# Patient Record
Sex: Female | Born: 1980 | Race: White | Hispanic: No | Marital: Single | Smoking: Current every day smoker
Health system: Southern US, Community
[De-identification: ages and names within clinical notes are randomized; demographics above are authoritative.]

## PROBLEM LIST (undated history)

## (undated) DIAGNOSIS — F319 Bipolar disorder, unspecified: Secondary | ICD-10-CM

## (undated) DIAGNOSIS — Q5210 Doubling of vagina, unspecified: Secondary | ICD-10-CM

## (undated) DIAGNOSIS — F329 Major depressive disorder, single episode, unspecified: Secondary | ICD-10-CM

## (undated) DIAGNOSIS — F419 Anxiety disorder, unspecified: Secondary | ICD-10-CM

## (undated) DIAGNOSIS — R1110 Vomiting, unspecified: Secondary | ICD-10-CM

## (undated) DIAGNOSIS — K5792 Diverticulitis of intestine, part unspecified, without perforation or abscess without bleeding: Secondary | ICD-10-CM

## (undated) DIAGNOSIS — F32A Depression, unspecified: Secondary | ICD-10-CM

## (undated) DIAGNOSIS — K449 Diaphragmatic hernia without obstruction or gangrene: Secondary | ICD-10-CM

## (undated) DIAGNOSIS — K469 Unspecified abdominal hernia without obstruction or gangrene: Secondary | ICD-10-CM

## (undated) DIAGNOSIS — K219 Gastro-esophageal reflux disease without esophagitis: Secondary | ICD-10-CM

## (undated) DIAGNOSIS — J45909 Unspecified asthma, uncomplicated: Secondary | ICD-10-CM

## (undated) HISTORY — PX: FRACTURE SURGERY: SHX138

## (undated) HISTORY — DX: Diverticulitis of intestine, part unspecified, without perforation or abscess without bleeding: K57.92

## (undated) HISTORY — PX: HERNIA REPAIR: SHX51

## (undated) HISTORY — DX: Vomiting, unspecified: R11.10

## (undated) HISTORY — DX: Unspecified abdominal hernia without obstruction or gangrene: K46.9

## (undated) HISTORY — PX: TUBAL LIGATION: SHX77

---

## 2000-08-30 ENCOUNTER — Encounter: Payer: Self-pay | Admitting: Emergency Medicine

## 2000-08-30 ENCOUNTER — Emergency Department (HOSPITAL_COMMUNITY): Admission: EM | Admit: 2000-08-30 | Discharge: 2000-08-30 | Payer: Self-pay | Admitting: Emergency Medicine

## 2001-08-12 ENCOUNTER — Emergency Department (HOSPITAL_COMMUNITY): Admission: EM | Admit: 2001-08-12 | Discharge: 2001-08-12 | Payer: Self-pay | Admitting: Emergency Medicine

## 2001-11-13 ENCOUNTER — Emergency Department (HOSPITAL_COMMUNITY): Admission: EM | Admit: 2001-11-13 | Discharge: 2001-11-13 | Payer: Self-pay | Admitting: Emergency Medicine

## 2001-11-13 ENCOUNTER — Encounter: Payer: Self-pay | Admitting: Emergency Medicine

## 2001-11-21 ENCOUNTER — Inpatient Hospital Stay (HOSPITAL_COMMUNITY): Admission: AD | Admit: 2001-11-21 | Discharge: 2001-11-21 | Payer: Self-pay | Admitting: *Deleted

## 2005-10-13 ENCOUNTER — Emergency Department (HOSPITAL_COMMUNITY): Admission: EM | Admit: 2005-10-13 | Discharge: 2005-10-13 | Payer: Self-pay | Admitting: Emergency Medicine

## 2006-07-08 ENCOUNTER — Emergency Department: Payer: Self-pay | Admitting: Emergency Medicine

## 2006-11-14 ENCOUNTER — Emergency Department: Payer: Self-pay | Admitting: Emergency Medicine

## 2007-05-28 ENCOUNTER — Emergency Department: Payer: Self-pay | Admitting: Emergency Medicine

## 2008-08-19 ENCOUNTER — Ambulatory Visit: Payer: Self-pay | Admitting: Gastroenterology

## 2009-02-03 ENCOUNTER — Ambulatory Visit: Payer: Self-pay | Admitting: Family Medicine

## 2009-02-10 ENCOUNTER — Emergency Department (HOSPITAL_COMMUNITY): Admission: EM | Admit: 2009-02-10 | Discharge: 2009-02-10 | Payer: Self-pay | Admitting: Emergency Medicine

## 2009-03-16 ENCOUNTER — Emergency Department: Payer: Self-pay | Admitting: Emergency Medicine

## 2009-05-27 ENCOUNTER — Emergency Department: Payer: Self-pay | Admitting: Emergency Medicine

## 2009-07-22 ENCOUNTER — Emergency Department: Payer: Self-pay | Admitting: Emergency Medicine

## 2009-12-19 ENCOUNTER — Emergency Department: Payer: Self-pay | Admitting: Internal Medicine

## 2010-07-21 LAB — URINALYSIS, ROUTINE W REFLEX MICROSCOPIC
Bilirubin Urine: NEGATIVE
Bilirubin Urine: NEGATIVE
Glucose, UA: NEGATIVE mg/dL
Glucose, UA: NEGATIVE mg/dL
Hgb urine dipstick: NEGATIVE
Hgb urine dipstick: NEGATIVE
Ketones, ur: NEGATIVE mg/dL
Nitrite: NEGATIVE
Protein, ur: NEGATIVE mg/dL
Protein, ur: NEGATIVE mg/dL
Specific Gravity, Urine: 1.009 (ref 1.005–1.030)
Specific Gravity, Urine: 1.026 (ref 1.005–1.030)
Urobilinogen, UA: 0.2 mg/dL (ref 0.0–1.0)
Urobilinogen, UA: 1 mg/dL (ref 0.0–1.0)
pH: 6 (ref 5.0–8.0)

## 2010-07-21 LAB — DIFFERENTIAL
Eosinophils Absolute: 0.1 10*3/uL (ref 0.0–0.7)
Eosinophils Relative: 1 % (ref 0–5)
Lymphocytes Relative: 18 % (ref 12–46)
Lymphs Abs: 1.8 10*3/uL (ref 0.7–4.0)
Monocytes Relative: 5 % (ref 3–12)

## 2010-07-21 LAB — URINE MICROSCOPIC-ADD ON

## 2010-07-21 LAB — CBC
HCT: 39.2 % (ref 36.0–46.0)
Hemoglobin: 13.7 g/dL (ref 12.0–15.0)
MCV: 94.2 fL (ref 78.0–100.0)
RBC: 4.16 MIL/uL (ref 3.87–5.11)
WBC: 10.5 10*3/uL (ref 4.0–10.5)

## 2010-07-21 LAB — URINE CULTURE

## 2010-07-21 LAB — POCT I-STAT, CHEM 8
BUN: 16 mg/dL (ref 6–23)
Creatinine, Ser: 0.8 mg/dL (ref 0.4–1.2)
Glucose, Bld: 86 mg/dL (ref 70–99)
Hemoglobin: 14.3 g/dL (ref 12.0–15.0)
Sodium: 138 mEq/L (ref 135–145)
TCO2: 23 mmol/L (ref 0–100)

## 2010-07-21 LAB — WET PREP, GENITAL

## 2010-07-21 LAB — POCT PREGNANCY, URINE: Preg Test, Ur: NEGATIVE

## 2011-01-02 ENCOUNTER — Inpatient Hospital Stay (INDEPENDENT_AMBULATORY_CARE_PROVIDER_SITE_OTHER)
Admission: RE | Admit: 2011-01-02 | Discharge: 2011-01-02 | Disposition: A | Discharge status: Home / Self Care | Payer: Self-pay | Source: Ambulatory Visit | Attending: Emergency Medicine | Admitting: Emergency Medicine

## 2011-01-02 DIAGNOSIS — J40 Bronchitis, not specified as acute or chronic: Secondary | ICD-10-CM

## 2011-01-02 DIAGNOSIS — J019 Acute sinusitis, unspecified: Secondary | ICD-10-CM

## 2011-05-04 ENCOUNTER — Emergency Department (INDEPENDENT_AMBULATORY_CARE_PROVIDER_SITE_OTHER)
Admission: EM | Admit: 2011-05-04 | Discharge: 2011-05-04 | Disposition: A | Discharge status: Home / Self Care | Payer: Self-pay | Source: Home / Self Care | Attending: Family Medicine | Admitting: Family Medicine

## 2011-05-04 DIAGNOSIS — S93409A Sprain of unspecified ligament of unspecified ankle, initial encounter: Secondary | ICD-10-CM

## 2011-05-04 DIAGNOSIS — S93402A Sprain of unspecified ligament of left ankle, initial encounter: Secondary | ICD-10-CM

## 2011-05-04 MED ORDER — IBUPROFEN 800 MG PO TABS: 800.0000 mg | ORAL_TABLET | Freq: Three times a day (TID) | ORAL | Status: AC
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
## 2011-06-17 ENCOUNTER — Encounter (HOSPITAL_COMMUNITY): Payer: Self-pay | Admitting: Emergency Medicine

## 2011-06-17 ENCOUNTER — Emergency Department (HOSPITAL_COMMUNITY): Payer: Self-pay

## 2011-06-17 ENCOUNTER — Emergency Department (HOSPITAL_COMMUNITY)
Admission: EM | Admit: 2011-06-17 | Discharge: 2011-06-18 | Disposition: A | Discharge status: Home / Self Care | Payer: Self-pay | Attending: Emergency Medicine | Admitting: Emergency Medicine

## 2011-06-17 DIAGNOSIS — F419 Anxiety disorder, unspecified: Secondary | ICD-10-CM | POA: Insufficient documentation

## 2011-06-17 DIAGNOSIS — R109 Unspecified abdominal pain: Secondary | ICD-10-CM | POA: Insufficient documentation

## 2011-06-17 DIAGNOSIS — R102 Pelvic and perineal pain: Secondary | ICD-10-CM

## 2011-06-17 DIAGNOSIS — Z79899 Other long term (current) drug therapy: Secondary | ICD-10-CM | POA: Insufficient documentation

## 2011-06-17 DIAGNOSIS — N949 Unspecified condition associated with female genital organs and menstrual cycle: Secondary | ICD-10-CM | POA: Insufficient documentation

## 2011-06-17 DIAGNOSIS — K219 Gastro-esophageal reflux disease without esophagitis: Secondary | ICD-10-CM | POA: Insufficient documentation

## 2011-06-17 DIAGNOSIS — K5190 Ulcerative colitis, unspecified, without complications: Secondary | ICD-10-CM | POA: Insufficient documentation

## 2011-06-17 DIAGNOSIS — N760 Acute vaginitis: Secondary | ICD-10-CM | POA: Insufficient documentation

## 2011-06-17 DIAGNOSIS — A499 Bacterial infection, unspecified: Secondary | ICD-10-CM | POA: Insufficient documentation

## 2011-06-17 DIAGNOSIS — Q5210 Doubling of vagina, unspecified: Secondary | ICD-10-CM | POA: Insufficient documentation

## 2011-06-17 DIAGNOSIS — B9689 Other specified bacterial agents as the cause of diseases classified elsewhere: Secondary | ICD-10-CM | POA: Insufficient documentation

## 2011-06-17 HISTORY — DX: Gastro-esophageal reflux disease without esophagitis: K21.9

## 2011-06-17 HISTORY — DX: Doubling of vagina, unspecified: Q52.10

## 2011-06-17 HISTORY — DX: Anxiety disorder, unspecified: F41.9

## 2011-06-17 LAB — POCT PREGNANCY, URINE: Preg Test, Ur: NEGATIVE

## 2011-06-17 LAB — URINALYSIS, ROUTINE W REFLEX MICROSCOPIC
Bilirubin Urine: NEGATIVE
Glucose, UA: NEGATIVE mg/dL
Ketones, ur: NEGATIVE mg/dL
Leukocytes, UA: NEGATIVE
Nitrite: NEGATIVE
Protein, ur: NEGATIVE mg/dL
Specific Gravity, Urine: 1.024 (ref 1.005–1.030)
Urobilinogen, UA: 0.2 mg/dL (ref 0.0–1.0)
pH: 6.5 (ref 5.0–8.0)

## 2011-06-17 LAB — COMPREHENSIVE METABOLIC PANEL
ALT: 23 U/L (ref 0–35)
AST: 17 U/L (ref 0–37)
Albumin: 3.6 g/dL (ref 3.5–5.2)
Alkaline Phosphatase: 51 U/L (ref 39–117)
BUN: 14 mg/dL (ref 6–23)
CO2: 27 mEq/L (ref 19–32)
Calcium: 9.6 mg/dL (ref 8.4–10.5)
Chloride: 104 mEq/L (ref 96–112)
Creatinine, Ser: 0.88 mg/dL (ref 0.50–1.10)
GFR calc Af Amer: >90 mL/min (ref >90)
GFR calc non Af Amer: 87 mL/min — ABNORMAL LOW (ref >90)
Glucose, Bld: 91 mg/dL (ref 70–99)
Potassium: 4.2 mEq/L (ref 3.5–5.1)
Sodium: 139 mEq/L (ref 135–145)
Total Bilirubin: 0.1 mg/dL — ABNORMAL LOW (ref 0.3–1.2)
Total Protein: 6.7 g/dL (ref 6.0–8.3)

## 2011-06-17 LAB — CBC
HCT: 36 % (ref 36.0–46.0)
Hemoglobin: 12.4 g/dL (ref 12.0–15.0)
MCH: 31.9 pg (ref 26.0–34.0)
MCHC: 34.4 g/dL (ref 30.0–36.0)
MCV: 92.5 fL (ref 78.0–100.0)
Platelets: 257 10*3/uL (ref 150–400)
RBC: 3.89 MIL/uL (ref 3.87–5.11)
RDW: 12 % (ref 11.5–15.5)
WBC: 11.7 10*3/uL — ABNORMAL HIGH (ref 4.0–10.5)

## 2011-06-17 LAB — DIFFERENTIAL
Basophils Absolute: 0 10*3/uL (ref 0.0–0.1)
Basophils Relative: 0 % (ref 0–1)
Eosinophils Absolute: 0.3 10*3/uL (ref 0.0–0.7)
Eosinophils Relative: 3 % (ref 0–5)
Lymphocytes Relative: 34 % (ref 12–46)
Lymphs Abs: 4 10*3/uL (ref 0.7–4.0)
Monocytes Absolute: 0.8 10*3/uL (ref 0.1–1.0)
Monocytes Relative: 7 % (ref 3–12)
Neutro Abs: 6.6 10*3/uL (ref 1.7–7.7)
Neutrophils Relative %: 56 % (ref 43–77)

## 2011-06-17 LAB — URINE MICROSCOPIC-ADD ON

## 2011-06-17 LAB — LIPASE, BLOOD: Lipase: 24 U/L (ref 11–59)

## 2011-06-17 MED ORDER — MORPHINE SULFATE 4 MG/ML IJ SOLN
6.0000 mg | Freq: Once | INTRAMUSCULAR | Status: AC
  Administered 2011-06-17: 6 mg via INTRAVENOUS
  Filled 2011-06-17: qty 2

## 2011-06-17 MED ORDER — ONDANSETRON HCL 4 MG/2ML IJ SOLN
4.0000 mg | Freq: Once | INTRAMUSCULAR | Status: AC
  Administered 2011-06-17: 4 mg via INTRAVENOUS
  Filled 2011-06-17: qty 2

## 2011-06-17 NOTE — ED Notes (Signed)
Patient complaining of abdominal pain, lower back pain, and urinary frequency Patient states that she has a "uteral septum", that she has not had treated (2010) --- patient states that she has had problems with abdominal pain and back pain since her diagnosis.  Patient states that her symptoms are no different than in the past several months, other than she "cannot tolerate the pain anymore".  Patient denies nausea, vomiting, and diarrhea.

## 2011-06-18 LAB — WET PREP, GENITAL
Trich, Wet Prep: NONE SEEN
Yeast Wet Prep HPF POC: NONE SEEN

## 2011-06-18 MED ORDER — METRONIDAZOLE 500 MG PO TABS: 500.0000 mg | ORAL_TABLET | Freq: Two times a day (BID) | ORAL | Status: AC

## 2011-06-18 MED ORDER — MORPHINE SULFATE 4 MG/ML IJ SOLN
6.0000 mg | Freq: Once | INTRAMUSCULAR | Status: AC
  Administered 2011-06-18: 6 mg via INTRAVENOUS
  Filled 2011-06-18: qty 2

## 2011-06-18 MED ORDER — HYDROCODONE-ACETAMINOPHEN 5-325 MG PO TABS: 1.0000 | ORAL_TABLET | Freq: Four times a day (QID) | ORAL | Status: AC | PRN

## 2011-06-18 NOTE — Discharge Instructions (Signed)
Return here as needed. Follow up with your doctor. °

## 2011-06-18 NOTE — ED Provider Notes (Signed)
History     CSN: [id number]  Arrival date & time 06/17/11  1448   First MD Initiated Contact with Patient 06/17/11 2150      Chief Complaint  Patient presents with  . Abdominal Pain    (Consider location/radiation/quality/duration/timing/severity/associated sxs/prior treatment) HPI   Patient has the emergency department with lower abdominal pain she states she has had in the past but began to carry 2 weeks ago.  She states that she has a uterine issue that she has been seen for the past that causes the similar type pain.  It seems to get worse with her onset of period.  She states that she started her period about 3 hours before coming to the emergency department.  She denies nausea/vomiting, chest pain, shortness of breath, weakness, back pain, urinary symptoms or fevers.  She states that she has a uterine septum.  Patient does state that she has had some mild vaginal discharge as well. Past Medical History  Diagnosis Date  . Ulcerative colitis   . GERD (gastroesophageal reflux disease)   . Vaginal septum   . Anxiety     History reviewed. No pertinent past surgical history.  History reviewed. No pertinent family history.  History  Substance Use Topics  . Smoking status: Current Everyday Smoker -- 0.5 packs/day  . Smokeless tobacco: Not on file  . Alcohol Use: No    OB History    Grav Para Term Preterm Abortions TAB SAB Ect Mult Living                  Review of Systems All pertinent positives and negatives reviewed in the history of present illness  Allergies  Review of patient's allergies indicates no known allergies.  Home Medications   Current Outpatient Rx  Name Route Sig Dispense Refill  . BUSPIRONE HCL 10 MG PO TABS Oral Take 10 mg by mouth 3 (three) times daily.    Marland Kitchen CITALOPRAM HYDROBROMIDE 40 MG PO TABS Oral Take 40 mg by mouth daily.    Marland Kitchen CLONIDINE HCL 0.1 MG PO TABS Oral Take 0.05 mg by mouth 2 (two) times daily.     Marland Kitchen GABAPENTIN 400 MG PO CAPS Oral  Take 400 mg by mouth 3 (three) times daily.    . IBUPROFEN 200 MG PO TABS Oral Take 800 mg by mouth every 6 (six) hours as needed. For pain.    . TRAZODONE HCL 100 MG PO TABS Oral Take 200 mg by mouth at bedtime.    Marland Kitchen HYDROCODONE-ACETAMINOPHEN 5-325 MG PO TABS Oral Take 1 tablet by mouth every 6 (six) hours as needed for pain. 15 tablet 0  . METRONIDAZOLE 500 MG PO TABS Oral Take 1 tablet (500 mg total) by mouth 2 (two) times daily. 14 tablet 0    BP 114/73  Pulse 101  Temp(Src) 98.6 F (37 C) (Oral)  Resp 16  SpO2 97%  LMP 06/17/2011  Physical Exam  Nursing note and vitals reviewed. Constitutional: She is oriented to person, place, and time. She appears well-developed and well-nourished. No distress.  HENT:  Head: Normocephalic and atraumatic.  Cardiovascular: Normal rate, regular rhythm and normal heart sounds.   Pulmonary/Chest: Effort normal and breath sounds normal. No respiratory distress. She has no wheezes. She has no rales.  Abdominal: Soft. Bowel sounds are normal.    Genitourinary: Vagina normal. Cervix exhibits no motion tenderness. Right adnexum displays no mass, no tenderness and no fullness. Left adnexum displays tenderness. Left adnexum displays no mass  and no fullness.    Neurological: She is alert and oriented to person, place, and time.  Skin: Skin is warm and dry.    ED Course  Procedures (including critical care time)  Labs Reviewed  URINALYSIS, ROUTINE W REFLEX MICROSCOPIC - Abnormal; Notable for the following:    APPearance CLOUDY (*)    Hgb urine dipstick LARGE (*)    All other components within normal limits  URINE MICROSCOPIC-ADD ON - Abnormal; Notable for the following:    Squamous Epithelial / LPF MANY (*)    Bacteria, UA MANY (*)    All other components within normal limits  CBC - Abnormal; Notable for the following:    WBC 11.7 (*)    All other components within normal limits  COMPREHENSIVE METABOLIC PANEL - Abnormal; Notable for the  following:    Total Bilirubin 0.1 (*)    GFR calc non Af Amer 87 (*)    All other components within normal limits  WET PREP, GENITAL - Abnormal; Notable for the following:    Clue Cells Wet Prep HPF POC FEW (*)    WBC, Wet Prep HPF POC MODERATE (*)    All other components within normal limits  POCT PREGNANCY, URINE  DIFFERENTIAL  LIPASE, BLOOD  GC/CHLAMYDIA PROBE AMP, GENITAL   US Transvaginal Non-ob  06/18/2011  *RADIOLOGY REPORT*  Clinical Data: Pelvic pain  TRANSABDOMINAL AND TRANSVAGINAL ULTRASOUND OF PELVIS  Technique:  Both transabdominal and transvaginal ultrasound examinations of the pelvis were performed.  Transabdominal technique was performed for global imaging of the pelvis including uterus, ovaries, adnexal regions, and pelvic cul-de-sac.  It was necessary to proceed with endovaginal exam following the transabdominal exam to visualize the uterus, endometrium and ovaries.  Comparison:  None.  Findings: Uterus:  Measures 10.7 x 5.8 x 6.2 cm.  There are multiple Nabothian cysts noted within the cervix.  Endometrium: Appears normal for patient's age measuring 12.6 mm.  Right ovary: Measures 4.3 x 2.0 x 2.6 cm.    Normal appearance/no adnexal mass  Left ovary: Measures 3.6 x 1.9 x 2.9 cm.  Normal appearance/no adnexal mass  Other Findings:  No free fluid.  IMPRESSION:  1.  No acute findings. 2.  No pelvic mass or other significant abnormality.  Original Report Authenticated By: Angelita Ingles, M.D.   US Pelvis Complete  06/18/2011  *RADIOLOGY REPORT*  Clinical Data: Pelvic pain  TRANSABDOMINAL AND TRANSVAGINAL ULTRASOUND OF PELVIS  Technique:  Both transabdominal and transvaginal ultrasound examinations of the pelvis were performed.  Transabdominal technique was performed for global imaging of the pelvis including uterus, ovaries, adnexal regions, and pelvic cul-de-sac.  It was necessary to proceed with endovaginal exam following the transabdominal exam to visualize the uterus, endometrium  and ovaries.  Comparison:  None.  Findings: Uterus:  Measures 10.7 x 5.8 x 6.2 cm.  There are multiple Nabothian cysts noted within the cervix.  Endometrium: Appears normal for patient's age measuring 12.6 mm.  Right ovary: Measures 4.3 x 2.0 x 2.6 cm.    Normal appearance/no adnexal mass  Left ovary: Measures 3.6 x 1.9 x 2.9 cm.  Normal appearance/no adnexal mass  Other Findings:  No free fluid.  IMPRESSION:  1.  No acute findings. 2.  No pelvic mass or other significant abnormality.  Original Report Authenticated By: Angelita Ingles, M.D.     1. Pelvic pain   2. Bacterial vaginosis    Patient is referred back to her GYN doctor.  She is given  pain control for home and told to return here for any worsening in her condition.  I advised her that she does have a mild bacterial vaginosis we will treat based on her previous mild discharge.  She is told to return here as needed for any worsening in her condition   MDM  MDM Reviewed: nursing note and vitals Interpretation: labs and ultrasound            Brent General, PA-C 06/18/11 1531

## 2011-06-19 LAB — GC/CHLAMYDIA PROBE AMP, GENITAL
Chlamydia, DNA Probe: NEGATIVE
GC Probe Amp, Genital: NEGATIVE

## 2011-06-26 NOTE — ED Provider Notes (Signed)
Medical screening examination/treatment/procedure(s) were performed by non-physician practitioner and as supervising physician I was immediately available for consultation/collaboration.  Virgel Manifold, MD 06/26/11 1911

## 2011-08-02 ENCOUNTER — Encounter (HOSPITAL_COMMUNITY): Payer: Self-pay | Admitting: Emergency Medicine

## 2011-08-02 ENCOUNTER — Emergency Department (HOSPITAL_COMMUNITY)
Admission: EM | Admit: 2011-08-02 | Discharge: 2011-08-02 | Disposition: A | Discharge status: Home / Self Care | Payer: Self-pay | Attending: Emergency Medicine | Admitting: Emergency Medicine

## 2011-08-02 ENCOUNTER — Emergency Department (HOSPITAL_COMMUNITY): Payer: Self-pay

## 2011-08-02 DIAGNOSIS — J40 Bronchitis, not specified as acute or chronic: Secondary | ICD-10-CM | POA: Insufficient documentation

## 2011-08-02 DIAGNOSIS — R05 Cough: Secondary | ICD-10-CM | POA: Insufficient documentation

## 2011-08-02 DIAGNOSIS — J3489 Other specified disorders of nose and nasal sinuses: Secondary | ICD-10-CM | POA: Insufficient documentation

## 2011-08-02 DIAGNOSIS — R070 Pain in throat: Secondary | ICD-10-CM | POA: Insufficient documentation

## 2011-08-02 DIAGNOSIS — R079 Chest pain, unspecified: Secondary | ICD-10-CM | POA: Insufficient documentation

## 2011-08-02 DIAGNOSIS — R059 Cough, unspecified: Secondary | ICD-10-CM | POA: Insufficient documentation

## 2011-08-02 DIAGNOSIS — K219 Gastro-esophageal reflux disease without esophagitis: Secondary | ICD-10-CM | POA: Insufficient documentation

## 2011-08-02 DIAGNOSIS — Z79899 Other long term (current) drug therapy: Secondary | ICD-10-CM | POA: Insufficient documentation

## 2011-08-02 DIAGNOSIS — M549 Dorsalgia, unspecified: Secondary | ICD-10-CM | POA: Insufficient documentation

## 2011-08-02 LAB — D-DIMER, QUANTITATIVE: D-Dimer, Quant: 0.27 ug/mL-FEU (ref 0.00–0.48)

## 2011-08-02 LAB — POCT I-STAT, CHEM 8
BUN: 19 mg/dL (ref 6–23)
Chloride: 108 mEq/L (ref 96–112)
Creatinine, Ser: 1.1 mg/dL (ref 0.50–1.10)
Sodium: 139 mEq/L (ref 135–145)

## 2011-08-02 MED ORDER — ALBUTEROL SULFATE (5 MG/ML) 0.5% IN NEBU
2.5000 mg | INHALATION_SOLUTION | Freq: Once | RESPIRATORY_TRACT | Status: AC
  Administered 2011-08-02: 2.5 mg via RESPIRATORY_TRACT
  Filled 2011-08-02 (×2): qty 0.5

## 2011-08-02 MED ORDER — AZITHROMYCIN 250 MG PO TABS: 250.0000 mg | ORAL_TABLET | Freq: Every day | ORAL | Status: AC

## 2011-08-02 MED ORDER — ALBUTEROL SULFATE HFA 108 (90 BASE) MCG/ACT IN AERS: 2.0000 | INHALATION_SPRAY | RESPIRATORY_TRACT | Status: DC | PRN

## 2011-08-02 NOTE — ED Provider Notes (Signed)
History     CSN: [id number]  Arrival date & time 08/02/11  0100   First MD Initiated Contact with Patient 08/02/11 0214      Chief Complaint  Patient presents with  . Cough  . Back Pain    (Consider location/radiation/quality/duration/timing/severity/associated sxs/prior treatment) HPI Comments: Patient complains of 3 weeks of cough productive of white sputum as well as "lung pain" on her bilateral sides of her rib cage. She endorses anterior chest pain with coughing. She also has rhinorrhea, sore throat and congestion. She is a smoker but says she has cut down to half a pack a day. She denies any abdominal pain, nausea, vomiting, lower back pain. No weakness, numbness or tingling. She is not on birth control  The history is provided by the patient.    Past Medical History  Diagnosis Date  . Ulcerative colitis   . GERD (gastroesophageal reflux disease)   . Vaginal septum   . Anxiety     History reviewed. No pertinent past surgical history.  History reviewed. No pertinent family history.  History  Substance Use Topics  . Smoking status: Current Everyday Smoker -- 0.5 packs/day  . Smokeless tobacco: Not on file  . Alcohol Use: No    OB History    Grav Para Term Preterm Abortions TAB SAB Ect Mult Living                  Review of Systems  Constitutional: Positive for activity change and appetite change. Negative for fever.  HENT: Positive for congestion and rhinorrhea. Negative for sore throat.   Eyes: Negative for visual disturbance.  Respiratory: Positive for cough and chest tightness. Negative for shortness of breath.   Cardiovascular: Positive for chest pain.  Gastrointestinal: Negative for nausea, vomiting and abdominal pain.  Genitourinary: Negative for dysuria and hematuria.  Musculoskeletal: Positive for back pain.  Skin: Negative for rash.  Neurological: Negative for weakness and headaches.    Allergies  Review of patient's allergies indicates no  known allergies.  Home Medications   Current Outpatient Rx  Name Route Sig Dispense Refill  . BUSPIRONE HCL 10 MG PO TABS Oral Take 10 mg by mouth 3 (three) times daily.    Marland Kitchen CITALOPRAM HYDROBROMIDE 40 MG PO TABS Oral Take 40 mg by mouth daily.    Marland Kitchen CLONIDINE HCL 0.1 MG PO TABS Oral Take 0.05 mg by mouth 2 (two) times daily.     Marland Kitchen GABAPENTIN 400 MG PO CAPS Oral Take 400 mg by mouth 3 (three) times daily.    . IBUPROFEN 200 MG PO TABS Oral Take 800 mg by mouth every 6 (six) hours as needed. For pain.    . TRAZODONE HCL 100 MG PO TABS Oral Take 200 mg by mouth at bedtime.    . ALBUTEROL SULFATE HFA 108 (90 BASE) MCG/ACT IN AERS Inhalation Inhale 2 puffs into the lungs every 4 (four) hours as needed for wheezing. 1 Inhaler 0  . AZITHROMYCIN 250 MG PO TABS Oral Take 1 tablet (250 mg total) by mouth daily. Take first 2 tablets together, then 1 every day until finished. 6 tablet 0    BP 131/77  Pulse 113  Temp(Src) 98.8 F (37.1 C) (Oral)  Resp 18  SpO2 99%  LMP 06/17/2011  Physical Exam  Constitutional: She is oriented to person, place, and time. She appears well-developed and well-nourished. No distress.  HENT:  Head: Normocephalic and atraumatic.  Mouth/Throat: Oropharynx is clear and moist. No oropharyngeal exudate.  Eyes: Conjunctivae and EOM are normal. Pupils are equal, round, and reactive to light.  Neck: Normal range of motion. Neck supple.  Cardiovascular: Normal rate, regular rhythm and normal heart sounds.   Pulmonary/Chest: Effort normal and breath sounds normal. She exhibits tenderness.       Anterior chest wall tenderness  Abdominal: Soft. There is no tenderness. There is no rebound and no guarding.  Musculoskeletal: Normal range of motion. She exhibits no edema.  Neurological: She is alert and oriented to person, place, and time. No cranial nerve deficit.  Skin: Skin is warm.    ED Course  Procedures (including critical care time)   Labs Reviewed  D-DIMER,  QUANTITATIVE  POCT I-STAT, CHEM 8  LAB REPORT - SCANNED   Dg Chest 2 View  08/02/2011  *RADIOLOGY REPORT*  Clinical Data: Back pain, radiating to the left chest; congestion.  CHEST - 2 VIEW  Comparison: None.  Findings: The lungs are well-aerated and clear.  There is no evidence of focal opacification, pleural effusion or pneumothorax. Mildly increased density at the lung bases appears to reflect overlying soft tissues.  The heart is normal in size; the mediastinal contour is within normal limits.  No acute osseous abnormalities are seen.  IMPRESSION: No acute cardiopulmonary process seen.  Original Report Authenticated By: Santa Lighter, M.D.     1. Bronchitis       MDM  Cough, congestion, back pain chest pain and history of smoker. Vitals stable, no distress, lungs clear  Chest x-ray, breathing treatment, d-dimer.  CXR neg.  No distress.  Smoking cessation.       Ezequiel Essex, MD 08/02/11 [phone]

## 2011-08-02 NOTE — ED Notes (Addendum)
Patient complaining of mid-back pain and a persistent productive cough with yellow phlegm production for the past three weeks.  Patient also reports white spots on her tongue/tonsils.  Reports chest pain with coughing.

## 2011-08-02 NOTE — Discharge Instructions (Signed)

## 2011-12-07 ENCOUNTER — Emergency Department (HOSPITAL_COMMUNITY)
Admission: EM | Admit: 2011-12-07 | Discharge: 2011-12-07 | Disposition: A | Discharge status: Home / Self Care | Payer: Self-pay | Attending: Emergency Medicine | Admitting: Emergency Medicine

## 2011-12-07 ENCOUNTER — Encounter (HOSPITAL_COMMUNITY): Payer: Self-pay | Admitting: Emergency Medicine

## 2011-12-07 ENCOUNTER — Emergency Department (HOSPITAL_COMMUNITY): Payer: Self-pay

## 2011-12-07 DIAGNOSIS — S60219A Contusion of unspecified wrist, initial encounter: Secondary | ICD-10-CM | POA: Insufficient documentation

## 2011-12-07 DIAGNOSIS — F411 Generalized anxiety disorder: Secondary | ICD-10-CM | POA: Insufficient documentation

## 2011-12-07 DIAGNOSIS — F17200 Nicotine dependence, unspecified, uncomplicated: Secondary | ICD-10-CM | POA: Insufficient documentation

## 2011-12-07 DIAGNOSIS — K219 Gastro-esophageal reflux disease without esophagitis: Secondary | ICD-10-CM | POA: Insufficient documentation

## 2011-12-07 MED ORDER — IBUPROFEN 800 MG PO TABS: 800.0000 mg | ORAL_TABLET | Freq: Three times a day (TID) | ORAL | Status: AC

## 2011-12-07 MED ORDER — IBUPROFEN 400 MG PO TABS
800.0000 mg | ORAL_TABLET | Freq: Once | ORAL | Status: AC
  Administered 2011-12-07: 800 mg via ORAL
  Filled 2011-12-07: qty 2
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
## 2011-12-07 NOTE — ED Notes (Signed)
Ortho paged for velcro wrist splint.

## 2011-12-07 NOTE — Progress Notes (Signed)
Orthopedic Tech Progress Note Patient Details:  Victoria Pierce 06-01-1980 [id number] Velcro wrist splint applied to Right wrist, pt. Tolerated well. Pt. Already had discharge papers and ready to go. Nurse notified. Ortho Devices Type of Ortho Device: Velcro wrist splint Ortho Device/Splint Location: Applied to Right wrist Ortho Device/Splint Interventions: Application   Asia R Thompson 12/07/2011, 12:53 PM

## 2013-11-15 ENCOUNTER — Emergency Department (HOSPITAL_COMMUNITY)
Admission: EM | Admit: 2013-11-15 | Discharge: 2013-11-16 | Disposition: A | Discharge status: Home / Self Care | Payer: Self-pay | Attending: Emergency Medicine | Admitting: Emergency Medicine

## 2013-11-15 ENCOUNTER — Encounter (HOSPITAL_COMMUNITY): Payer: Self-pay | Admitting: Emergency Medicine

## 2013-11-15 DIAGNOSIS — Q524 Other congenital malformations of vagina: Secondary | ICD-10-CM

## 2013-11-15 DIAGNOSIS — Z791 Long term (current) use of non-steroidal anti-inflammatories (NSAID): Secondary | ICD-10-CM | POA: Insufficient documentation

## 2013-11-15 DIAGNOSIS — F411 Generalized anxiety disorder: Secondary | ICD-10-CM | POA: Insufficient documentation

## 2013-11-15 DIAGNOSIS — Z23 Encounter for immunization: Secondary | ICD-10-CM | POA: Insufficient documentation

## 2013-11-15 DIAGNOSIS — Z79899 Other long term (current) drug therapy: Secondary | ICD-10-CM | POA: Insufficient documentation

## 2013-11-15 DIAGNOSIS — S91311A Laceration without foreign body, right foot, initial encounter: Secondary | ICD-10-CM

## 2013-11-15 DIAGNOSIS — Z8719 Personal history of other diseases of the digestive system: Secondary | ICD-10-CM | POA: Insufficient documentation

## 2013-11-15 DIAGNOSIS — W208XXA Other cause of strike by thrown, projected or falling object, initial encounter: Secondary | ICD-10-CM | POA: Insufficient documentation

## 2013-11-15 DIAGNOSIS — S91309A Unspecified open wound, unspecified foot, initial encounter: Secondary | ICD-10-CM | POA: Insufficient documentation

## 2013-11-15 DIAGNOSIS — S99929A Unspecified injury of unspecified foot, initial encounter: Secondary | ICD-10-CM

## 2013-11-15 DIAGNOSIS — Q5270 Unspecified congenital malformations of vulva: Secondary | ICD-10-CM

## 2013-11-15 DIAGNOSIS — F17200 Nicotine dependence, unspecified, uncomplicated: Secondary | ICD-10-CM | POA: Insufficient documentation

## 2013-11-15 DIAGNOSIS — Y9289 Other specified places as the place of occurrence of the external cause: Secondary | ICD-10-CM | POA: Insufficient documentation

## 2013-11-15 DIAGNOSIS — Q519 Congenital malformation of uterus and cervix, unspecified: Secondary | ICD-10-CM | POA: Insufficient documentation

## 2013-11-15 DIAGNOSIS — S99919A Unspecified injury of unspecified ankle, initial encounter: Secondary | ICD-10-CM

## 2013-11-15 DIAGNOSIS — S8990XA Unspecified injury of unspecified lower leg, initial encounter: Secondary | ICD-10-CM | POA: Insufficient documentation

## 2013-11-15 DIAGNOSIS — Y9389 Activity, other specified: Secondary | ICD-10-CM | POA: Insufficient documentation
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
## 2013-11-16 ENCOUNTER — Emergency Department (HOSPITAL_COMMUNITY): Payer: Self-pay

## 2013-11-16 MED ORDER — OXYCODONE-ACETAMINOPHEN 5-325 MG PO TABS
1.0000 | ORAL_TABLET | Freq: Once | ORAL | Status: AC
  Administered 2013-11-16: 1 via ORAL
  Filled 2013-11-16: qty 1

## 2013-11-16 MED ORDER — TETANUS-DIPHTH-ACELL PERTUSSIS 5-2.5-18.5 LF-MCG/0.5 IM SUSP
0.5000 mL | Freq: Once | INTRAMUSCULAR | Status: AC
  Administered 2013-11-16: 0.5 mL via INTRAMUSCULAR
  Filled 2013-11-16: qty 0.5

## 2013-11-16 MED ORDER — IBUPROFEN 600 MG PO TABS: 600.0000 mg | ORAL_TABLET | Freq: Four times a day (QID) | ORAL | Status: DC | PRN

## 2013-11-16 MED ORDER — HYDROCODONE-ACETAMINOPHEN 5-325 MG PO TABS: 1.0000 | ORAL_TABLET | Freq: Four times a day (QID) | ORAL | Status: DC | PRN

## 2013-11-16 NOTE — ED Provider Notes (Signed)
CSN: [id number]     Arrival date & time 11/15/13  2318 History   First MD Initiated Contact with Patient 11/15/13 2334     Chief Complaint  Patient presents with  . Foot Injury     (Consider location/radiation/quality/duration/timing/severity/associated sxs/prior Treatment) HPI Victoria Pierce is a 33 y.o. female who presents to emergency department complaining of injury to her right foot. Patient states she was trying to put a new tie air on the car, when a tire iron slipped and fell stabbing her in the foot. Patient is here but laceration over her dorsal foot directly over first MTP joint. She states unable to stop the bleeding. She states she does have decreased sensation over the great toe, but states she is able to move in with pain. She denies any other injuries. Last tetanus unknown. No other complaints.  Past Medical History  Diagnosis Date  . Ulcerative colitis   . GERD (gastroesophageal reflux disease)   . Vaginal septum   . Anxiety    Past Surgical History  Procedure Laterality Date  . Tubal ligation     History reviewed. No pertinent family history. History  Substance Use Topics  . Smoking status: Current Every Day Smoker -- 0.50 packs/day  . Smokeless tobacco: Not on file  . Alcohol Use: No   OB History   Grav Para Term Preterm Abortions TAB SAB Ect Mult Living                 Review of Systems  Constitutional: Negative for fever and chills.  Musculoskeletal: Positive for arthralgias and joint swelling.  Skin: Positive for wound.  Neurological: Negative for weakness and numbness.  Hematological: Does not bruise/bleed easily.      Allergies  Review of patient's allergies indicates no known allergies.  Home Medications   Prior to Admission medications   Medication Sig Start Date End Date Taking? Authorizing Provider  albuterol (PROVENTIL HFA;VENTOLIN HFA) 108 (90 BASE) MCG/ACT inhaler Inhale 2 puffs into the lungs every 4 (four) hours as needed for  wheezing. 08/02/11 08/01/12  Ezequiel Essex, MD  benztropine (COGENTIN) 1 MG tablet Take 1 mg by mouth 2 (two) times daily.    Historical Provider, MD  busPIRone (BUSPAR) 10 MG tablet Take 10 mg by mouth 3 (three) times daily.    Historical Provider, MD  cloNIDine (CATAPRES) 0.1 MG tablet Take 0.05 mg by mouth 2 (two) times daily.     Historical Provider, MD  gabapentin (NEURONTIN) 400 MG capsule Take 400 mg by mouth 3 (three) times daily.    Historical Provider, MD  haloperidol (HALDOL) 2 MG tablet Take 4 mg by mouth 2 (two) times daily.    Historical Provider, MD  ibuprofen (ADVIL,MOTRIN) 200 MG tablet Take 800 mg by mouth every 6 (six) hours as needed. For pain.    Historical Provider, MD  meloxicam (MOBIC) 7.5 MG tablet Take 7.5 mg by mouth daily.    Historical Provider, MD  topiramate (TOPAMAX) 25 MG tablet Take 25 mg by mouth 2 (two) times daily.    Historical Provider, MD  traZODone (DESYREL) 100 MG tablet Take 200 mg by mouth at bedtime.    Historical Provider, MD   BP 116/73  Pulse 91  Temp(Src) 97.9 F (36.6 C) (Oral)  Resp 20  SpO2 98%  LMP 10/15/2013 Physical Exam  Nursing note and vitals reviewed. Constitutional: She appears well-developed and well-nourished. No distress.  HENT:  Head: Normocephalic.  Eyes: Conjunctivae are normal.  Neck: Neck  supple.  Musculoskeletal: She exhibits no edema.  Patient is able to flex and extend her great toe on the right foot, with good strength against resistance with toe dorsiflexion. Reports diminished sensation over dorsal and tip of her right great toe. Cap refill is less than 2 seconds.  Neurological: She is alert.  Skin: Skin is warm and dry.  3 cm laceration over dorsal foot directly over first MTP joint.  Psychiatric: She has a normal mood and affect. Her behavior is normal.    ED Course  Procedures (including critical care time) Labs Review Labs Reviewed - No data to display  Imaging Review No results found.   EKG  Interpretation None      LACERATION REPAIR Performed by: Renold Genta Authorized by: Jeannett Senior A Consent: Verbal consent obtained. Risks and benefits: risks, benefits and alternatives were discussed Consent given by: patient Patient identity confirmed: provided demographic data Prepped and Draped in normal sterile fashion Wound explored  Laceration Location: MTP joint of the right great toe  Laceration Length: 3cm  No Foreign Bodies seen or palpated  Anesthesia: local infiltration  Local anesthetic: lidocaine 2% wo epinephrine  Anesthetic total: 3 ml  Irrigation method: syringe Amount of cleaning: standard  Skin closure: prolene 4.0  Number of sutures: 5  Technique: simple interrupted  Patient tolerance: Patient tolerated the procedure well with no immediate complications.  MDM   Final diagnoses:  Laceration of foot, right, initial encounter    Patient with a stab/laceration to the dorsal MTP joint of the right great toe. X-rays are negative. Patient has full range of motion of the toe actively and passively, good strength against resistance with toe dorsiflexion and plantar flexion. Cap refill distally is less than 2 seconds. Patient reports mildly decreased sensation to the dorsal distal toe. Wounds known to and explored, do not see any obvious tendon or nerve injury. Repaired with sutures. Have explained to patient although I do not see any obvious deep tissue injuries she will be referred to orthopedics Dr. for recheck and she should followup if she has any persistent numbness or weakness in the toe. Patient is agreeable to plan and will followup as needed. Patient placed in a postop shoe, and dressing applied to the wound. Patient is requesting pain medications, we'll prescribe 10 tablets of Norco in addition to ibuprofen.  Filed Vitals:   11/15/13 2331  BP: 116/73  Pulse: 91  Temp: 97.9 F (36.6 C)  TempSrc: Oral  Resp: 20  SpO2: 98%        Florene Route [name], PA-C 11/16/13 0143

## 2013-11-16 NOTE — ED Provider Notes (Signed)
Medical screening examination/treatment/procedure(s) were performed by non-physician practitioner and as supervising physician I was immediately available for consultation/collaboration.   EKG Interpretation None       Kalman Drape, MD 11/16/13 [phone]

## 2013-11-16 NOTE — Discharge Instructions (Signed)
Ibuprofen for pain. Norco for severe pain. Keep foot elevated. Ice. Bacitracin to the laceration twice a day. If continue to have decreased sensation in the toe in 2-3 days or if have weakness with movement follow up with orthopedics specialist.    Laceration Care, Adult A laceration is a cut that goes through all layers of the skin. The cut goes into the tissue beneath the skin. HOME CARE For stitches (sutures) or staples:  Keep the cut clean and dry.  If you have a bandage (dressing), change it at least once a day. Change the bandage if it gets wet or dirty, or as told by your doctor.  Wash the cut with soap and water 2 times a day. Rinse the cut with water. Pat it dry with a clean towel.  Put a thin layer of medicated cream on the cut as told by your doctor.  You may shower after the first 24 hours. Do not soak the cut in water until the stitches are removed.  Only take medicines as told by your doctor.  Have your stitches or staples removed as told by your doctor. For skin adhesive strips:  Keep the cut clean and dry.  Do not get the strips wet. You may take a bath, but be careful to keep the cut dry.  If the cut gets wet, pat it dry with a clean towel.  The strips will fall off on their own. Do not remove the strips that are still stuck to the cut. For wound glue:  You may shower or take baths. Do not soak or scrub the cut. Do not swim. Avoid heavy sweating until the glue falls off on its own. After a shower or bath, pat the cut dry with a clean towel.  Do not put medicine on your cut until the glue falls off.  If you have a bandage, do not put tape over the glue.  Avoid lots of sunlight or tanning lamps until the glue falls off. Put sunscreen on the cut for the first year to reduce your scar.  The glue will fall off on its own. Do not pick at the glue. You may need a tetanus shot if:  You cannot remember when you had your last tetanus shot.  You have never had a  tetanus shot. If you need a tetanus shot and you choose not to have one, you may get tetanus. Sickness from tetanus can be serious. GET HELP RIGHT AWAY IF:   Your pain does not get better with medicine.  Your arm, hand, leg, or foot loses feeling (numbness) or changes color.  Your cut is bleeding.  Your joint feels weak, or you cannot use your joint.  You have painful lumps on your body.  Your cut is red, puffy (swollen), or painful.  You have a red line on the skin near the cut.  You have yellowish-white fluid (pus) coming from the cut.  You have a fever.  You have a bad smell coming from the cut or bandage.  Your cut breaks open before or after stitches are removed.  You notice something coming out of the cut, such as wood or glass.  You cannot move a finger or toe. MAKE SURE YOU:   Understand these instructions.  Will watch your condition.  Will get help right away if you are not doing well or get worse. Document Released: 09/20/2007 Document Revised: 06/26/2011 Document Reviewed: 09/27/2010 [facility] Patient Information 2015 [place], [place]. This information is not  intended to replace advice given to you by your health care provider. Make sure you discuss any questions you have with your health care provider.

## 2015-01-31 ENCOUNTER — Emergency Department: Payer: Self-pay

## 2015-01-31 ENCOUNTER — Emergency Department
Admission: EM | Admit: 2015-01-31 | Discharge: 2015-01-31 | Disposition: A | Discharge status: Home / Self Care | Payer: Self-pay | Attending: Emergency Medicine | Admitting: Emergency Medicine

## 2015-01-31 DIAGNOSIS — Z79899 Other long term (current) drug therapy: Secondary | ICD-10-CM | POA: Insufficient documentation

## 2015-01-31 DIAGNOSIS — J069 Acute upper respiratory infection, unspecified: Secondary | ICD-10-CM | POA: Insufficient documentation

## 2015-01-31 DIAGNOSIS — M549 Dorsalgia, unspecified: Secondary | ICD-10-CM | POA: Insufficient documentation

## 2015-01-31 DIAGNOSIS — Z791 Long term (current) use of non-steroidal anti-inflammatories (NSAID): Secondary | ICD-10-CM | POA: Insufficient documentation

## 2015-01-31 DIAGNOSIS — Z720 Tobacco use: Secondary | ICD-10-CM | POA: Insufficient documentation

## 2015-01-31 MED ORDER — KETOROLAC TROMETHAMINE 60 MG/2ML IM SOLN
60.0000 mg | Freq: Once | INTRAMUSCULAR | Status: AC
  Administered 2015-01-31: 60 mg via INTRAMUSCULAR
  Filled 2015-01-31: qty 2

## 2015-01-31 MED ORDER — PSEUDOEPH-BROMPHEN-DM 30-2-10 MG/5ML PO SYRP: 5.0000 mL | ORAL_SOLUTION | Freq: Four times a day (QID) | ORAL | Status: DC | PRN

## 2015-01-31 MED ORDER — IBUPROFEN 800 MG PO TABS: 800.0000 mg | ORAL_TABLET | Freq: Three times a day (TID) | ORAL | Status: DC | PRN

## 2015-01-31 NOTE — Discharge Instructions (Signed)
Upper Respiratory Infection, Adult Most upper respiratory infections (URIs) are a viral infection of the air passages leading to the lungs. A URI affects the nose, throat, and upper air passages. The most common type of URI is nasopharyngitis and is typically referred to as "the common cold." URIs run their course and usually go away on their own. Most of the time, a URI does not require medical attention, but sometimes a bacterial infection in the upper airways can follow a viral infection. This is called a secondary infection. Sinus and middle ear infections are common types of secondary upper respiratory infections. Bacterial pneumonia can also complicate a URI. A URI can worsen asthma and chronic obstructive pulmonary disease (COPD). Sometimes, these complications can require emergency medical care and may be life threatening.  CAUSES Almost all URIs are caused by viruses. A virus is a type of germ and can spread from one person to another.  RISKS FACTORS You may be at risk for a URI if:   You smoke.   You have chronic heart or lung disease.  You have a weakened defense (immune) system.   You are very young or very old.   You have nasal allergies or asthma.  You work in crowded or poorly ventilated areas.  You work in health care facilities or schools. SIGNS AND SYMPTOMS  Symptoms typically develop 2-3 days after you come in contact with a cold virus. Most viral URIs last 7-10 days. However, viral URIs from the influenza virus (flu virus) can last 14-18 days and are typically more severe. Symptoms may include:   Runny or stuffy (congested) nose.   Sneezing.   Cough.   Sore throat.   Headache.   Fatigue.   Fever.   Loss of appetite.   Pain in your forehead, behind your eyes, and over your cheekbones (sinus pain).  Muscle aches.  DIAGNOSIS  Your health care provider may diagnose a URI by:  Physical exam.  Tests to check that your symptoms are not due to  another condition such as:  Strep throat.  Sinusitis.  Pneumonia.  Asthma. TREATMENT  A URI goes away on its own with time. It cannot be cured with medicines, but medicines may be prescribed or recommended to relieve symptoms. Medicines may help:  Reduce your fever.  Reduce your cough.  Relieve nasal congestion. HOME CARE INSTRUCTIONS   Take medicines only as directed by your health care provider.   Gargle warm saltwater or take cough drops to comfort your throat as directed by your health care provider.  Use a warm mist humidifier or inhale steam from a shower to increase air moisture. This may make it easier to breathe.  Drink enough fluid to keep your urine clear or pale yellow.   Eat soups and other clear broths and maintain good nutrition.   Rest as needed.   Return to work when your temperature has returned to normal or as your health care provider advises. You may need to stay home longer to avoid infecting others. You can also use a face mask and careful hand washing to prevent spread of the virus.  Increase the usage of your inhaler if you have asthma.   Do not use any tobacco products, including cigarettes, chewing tobacco, or electronic cigarettes. If you need help quitting, ask your health care provider. PREVENTION  The best way to protect yourself from getting a cold is to practice good hygiene.   Avoid oral or hand contact with people with cold   symptoms.   Wash your hands often if contact occurs.  There is no clear evidence that vitamin C, vitamin E, echinacea, or exercise reduces the chance of developing a cold. However, it is always recommended to get plenty of rest, exercise, and practice good nutrition.  SEEK MEDICAL CARE IF:   You are getting worse rather than better.   Your symptoms are not controlled by medicine.   You have chills.  You have worsening shortness of breath.  You have brown or red mucus.  You have yellow or brown nasal  discharge.  You have pain in your face, especially when you bend forward.  You have a fever.  You have swollen neck glands.  You have pain while swallowing.  You have white areas in the back of your throat. SEEK IMMEDIATE MEDICAL CARE IF:   You have severe or persistent:  Headache.  Ear pain.  Sinus pain.  Chest pain.  You have chronic lung disease and any of the following:  Wheezing.  Prolonged cough.  Coughing up blood.  A change in your usual mucus.  You have a stiff neck.  You have changes in your:  Vision.  Hearing.  Thinking.  Mood. MAKE SURE YOU:   Understand these instructions.  Will watch your condition.  Will get help right away if you are not doing well or get worse.   This information is not intended to replace advice given to you by your health care provider. Make sure you discuss any questions you have with your health care provider.   Document Released: 09/27/2000 Document Revised: 08/18/2014 Document Reviewed: 07/09/2013 Elsevier Interactive Patient Education 2016 [employer]  

## 2015-01-31 NOTE — ED Provider Notes (Signed)
[facility] Emergency Department Provider Note  ____________________________________________  Time seen: Approximately 8:16 PM  I have reviewed the triage vital signs and the nursing notes.   HISTORY  Chief Complaint Cough; Fever; and Back Pain    HPI Victoria Pierce is a 34 y.o. female complaining of 1 week of productive yellow sputum, body aches, fever and nasal congestion.Patient denies nausea and vomiting but states she's had loose stools. Patient rated her pain discomfort as a 9/10. No palliative measures taken for this complaint.   Past Medical History  Diagnosis Date  . Ulcerative colitis   . GERD (gastroesophageal reflux disease)   . Vaginal septum   . Anxiety     Patient Active Problem List   Diagnosis Date Noted  . Ulcerative colitis   . GERD (gastroesophageal reflux disease)   . Vaginal septum   . Anxiety     Past Surgical History  Procedure Laterality Date  . Tubal ligation      Current Outpatient Rx  Name  Route  Sig  Dispense  Refill  . EXPIRED: albuterol (PROVENTIL HFA;VENTOLIN HFA) 108 (90 BASE) MCG/ACT inhaler   Inhalation   Inhale 2 puffs into the lungs every 4 (four) hours as needed for wheezing.   1 Inhaler   0   . benztropine (COGENTIN) 1 MG tablet   Oral   Take 1 mg by mouth 2 (two) times daily.         . busPIRone (BUSPAR) 10 MG tablet   Oral   Take 10 mg by mouth 3 (three) times daily.         . cloNIDine (CATAPRES) 0.1 MG tablet   Oral   Take 0.05 mg by mouth 2 (two) times daily.          Marland Kitchen gabapentin (NEURONTIN) 400 MG capsule   Oral   Take 400 mg by mouth 3 (three) times daily.         . haloperidol (HALDOL) 2 MG tablet   Oral   Take 4 mg by mouth 2 (two) times daily.         Marland Kitchen HYDROcodone-acetaminophen (NORCO) 5-325 MG per tablet   Oral   Take 1 tablet by mouth every 6 (six) hours as needed.   10 tablet   0   . ibuprofen (ADVIL,MOTRIN) 200 MG tablet   Oral   Take 800 mg by  mouth every 6 (six) hours as needed. For pain.         Marland Kitchen ibuprofen (ADVIL,MOTRIN) 600 MG tablet   Oral   Take 1 tablet (600 mg total) by mouth every 6 (six) hours as needed.   30 tablet   0   . meloxicam (MOBIC) 7.5 MG tablet   Oral   Take 7.5 mg by mouth daily.         Marland Kitchen topiramate (TOPAMAX) 25 MG tablet   Oral   Take 25 mg by mouth 2 (two) times daily.         . traZODone (DESYREL) 100 MG tablet   Oral   Take 200 mg by mouth at bedtime.           Allergies Review of patient's allergies indicates no known allergies.  No family history on file.  Social History Social History  Substance Use Topics  . Smoking status: Current Every Day Smoker -- 0.50 packs/day  . Smokeless tobacco: Not on file  . Alcohol Use: No    Review of Systems Constitutional: Fever  and chills. States body aches Eyes: No visual changes. ENT: No sore throat. Nasal congestion Cardiovascular: Denies chest pain. Respiratory: Denies shortness of breath. Productive cough. Gastrointestinal: No abdominal pain.  No nausea, no vomiting.  Loose stools.  No constipation. Genitourinary: Negative for dysuria. Musculoskeletal: Negative for back pain. Skin: Negative for rash. Neurological: Negative for headaches, focal weakness or numbness. 10-point ROS otherwise negative.  ____________________________________________   PHYSICAL EXAM:  VITAL SIGNS: ED Triage Vitals  Enc Vitals Group     BP 01/31/15 1944 128/90 mmHg     Pulse Rate 01/31/15 1944 90     Resp 01/31/15 1944 16     Temp 01/31/15 1944 98.6 F (37 C)     Temp Source 01/31/15 1944 Oral     SpO2 01/31/15 1944 98 %     Weight 01/31/15 1944 170 lb (77.111 kg)     Height 01/31/15 1944 5' 2"  (1.575 m)     Head Cir --      Peak Flow --      Pain Score 01/31/15 1945 9     Pain Loc --      Pain Edu? --      Excl. in [place]? --     Constitutional: Alert and oriented. Well appearing and in no acute distress. Eyes: Conjunctivae are normal.  PERRL. EOMI. Head: Atraumatic. Nose: Bilateral maxillary guarding. Edematous nasal turbinates Mouth/Throat: Mucous membranes are moist.  Oropharynx erythematous. Postnasal drainage Neck: No stridor. No cervical spine tenderness to palpation. Hematological/Lymphatic/Immunilogical: No cervical lymphadenopathy. Cardiovascular: Normal rate, regular rhythm. Grossly normal heart sounds.  Good peripheral circulation. Respiratory: Normal respiratory effort.  No retractions. Lungs CTAB. Gastrointestinal: Soft and nontender. No distention. No abdominal bruits. No CVA tenderness. Musculoskeletal: No lower extremity tenderness nor edema.  No joint effusions. Neurologic:  Normal speech and language. No gross focal neurologic deficits are appreciated. No gait instability. Skin:  Skin is warm, dry and intact. No rash noted. Psychiatric: Mood and affect are normal. Speech and behavior are normal.  ____________________________________________   LABS (all labs ordered are listed, but only abnormal results are displayed)  Labs Reviewed - No data to display ____________________________________________  EKG   ____________________________________________  RADIOLOGY  No acute findings on chest x-ray. ____________________________________________   PROCEDURES  Procedure(s) performed: None  Critical Care performed: No  ____________________________________________   INITIAL IMPRESSION / ASSESSMENT AND PLAN / ED COURSE  Pertinent labs & imaging results that were available during my care of the patient were reviewed by me and considered in my medical decision making (see chart for details).  Viral upper rest or infection. Patient given a prescription for Bromfed DM and ibuprofen. ____________________________________________   FINAL CLINICAL IMPRESSION(S) / ED DIAGNOSES  Final diagnoses:  Acute URI of multiple sites      Sable Feil, PA-C 01/31/15 2029  Ahmed Prima,  MD 01/31/15 2056

## 2015-01-31 NOTE — ED Notes (Signed)
Pt states has had a week of yellow sputum production cough, body aches, fever, nasal congestion with yellow drainage.

## 2015-06-15 ENCOUNTER — Encounter: Payer: Self-pay | Admitting: Medical Oncology

## 2015-06-15 ENCOUNTER — Emergency Department: Payer: Self-pay

## 2015-06-15 ENCOUNTER — Emergency Department
Admission: EM | Admit: 2015-06-15 | Discharge: 2015-06-15 | Disposition: A | Discharge status: Home / Self Care | Payer: Self-pay | Attending: Emergency Medicine | Admitting: Emergency Medicine

## 2015-06-15 DIAGNOSIS — Z791 Long term (current) use of non-steroidal anti-inflammatories (NSAID): Secondary | ICD-10-CM | POA: Insufficient documentation

## 2015-06-15 DIAGNOSIS — R519 Headache, unspecified: Secondary | ICD-10-CM

## 2015-06-15 DIAGNOSIS — Z79899 Other long term (current) drug therapy: Secondary | ICD-10-CM | POA: Insufficient documentation

## 2015-06-15 DIAGNOSIS — Z3202 Encounter for pregnancy test, result negative: Secondary | ICD-10-CM | POA: Insufficient documentation

## 2015-06-15 DIAGNOSIS — R1084 Generalized abdominal pain: Secondary | ICD-10-CM | POA: Insufficient documentation

## 2015-06-15 DIAGNOSIS — R51 Headache: Secondary | ICD-10-CM | POA: Insufficient documentation

## 2015-06-15 DIAGNOSIS — F17200 Nicotine dependence, unspecified, uncomplicated: Secondary | ICD-10-CM | POA: Insufficient documentation

## 2015-06-15 DIAGNOSIS — R197 Diarrhea, unspecified: Secondary | ICD-10-CM | POA: Insufficient documentation

## 2015-06-15 DIAGNOSIS — R112 Nausea with vomiting, unspecified: Secondary | ICD-10-CM | POA: Insufficient documentation

## 2015-06-15 HISTORY — DX: Unspecified asthma, uncomplicated: J45.909

## 2015-06-15 LAB — COMPREHENSIVE METABOLIC PANEL
ALBUMIN: 4 g/dL (ref 3.5–5.0)
ALT: 20 U/L (ref 14–54)
ANION GAP: 8 (ref 5–15)
AST: 16 U/L (ref 15–41)
Alkaline Phosphatase: 69 U/L (ref 38–126)
BILIRUBIN TOTAL: 0.4 mg/dL (ref 0.3–1.2)
BUN: 14 mg/dL (ref 6–20)
CHLORIDE: 110 mmol/L (ref 101–111)
CO2: 22 mmol/L (ref 22–32)
Calcium: 8.9 mg/dL (ref 8.9–10.3)
Creatinine, Ser: 0.76 mg/dL (ref 0.44–1.00)
GFR calc Af Amer: >60 mL/min (ref >60)
GFR calc non Af Amer: >60 mL/min (ref >60)
GLUCOSE: 84 mg/dL (ref 65–99)
POTASSIUM: 3.9 mmol/L (ref 3.5–5.1)
SODIUM: 140 mmol/L (ref 135–145)
TOTAL PROTEIN: 7.1 g/dL (ref 6.5–8.1)

## 2015-06-15 LAB — URINALYSIS COMPLETE WITH MICROSCOPIC (ARMC ONLY)
Bilirubin Urine: NEGATIVE
Glucose, UA: NEGATIVE mg/dL
Ketones, ur: NEGATIVE mg/dL
Leukocytes, UA: NEGATIVE
NITRITE: NEGATIVE
PROTEIN: NEGATIVE mg/dL
SPECIFIC GRAVITY, URINE: 1.005 (ref 1.005–1.030)
pH: 6 (ref 5.0–8.0)

## 2015-06-15 LAB — CBC
HEMATOCRIT: 36.5 % (ref 35.0–47.0)
HEMOGLOBIN: 12.4 g/dL (ref 12.0–16.0)
MCH: 30.7 pg (ref 26.0–34.0)
MCHC: 34 g/dL (ref 32.0–36.0)
MCV: 90.4 fL (ref 80.0–100.0)
Platelets: 283 10*3/uL (ref 150–440)
RBC: 4.03 MIL/uL (ref 3.80–5.20)
RDW: 13.6 % (ref 11.5–14.5)
WBC: 10.9 10*3/uL (ref 3.6–11.0)

## 2015-06-15 LAB — LIPASE, BLOOD: LIPASE: 19 U/L (ref 11–51)

## 2015-06-15 LAB — PREGNANCY, URINE: PREG TEST UR: NEGATIVE

## 2015-06-15 LAB — POCT PREGNANCY, URINE: PREG TEST UR: NEGATIVE

## 2015-06-15 MED ORDER — MORPHINE SULFATE (PF) 2 MG/ML IV SOLN
2.0000 mg | Freq: Once | INTRAVENOUS | Status: AC
  Administered 2015-06-15: 2 mg via INTRAVENOUS
  Filled 2015-06-15: qty 1

## 2015-06-15 MED ORDER — IOHEXOL 240 MG/ML SOLN
25.0000 mL | INTRAMUSCULAR | Status: AC
  Administered 2015-06-15 (×2): 25 mL via ORAL

## 2015-06-15 MED ORDER — HYDROMORPHONE HCL 1 MG/ML IJ SOLN
1.0000 mg | INTRAMUSCULAR | Status: AC
  Administered 2015-06-15: 1 mg via INTRAVENOUS
  Filled 2015-06-15: qty 1

## 2015-06-15 MED ORDER — SODIUM CHLORIDE 0.9 % IV SOLN
Freq: Once | INTRAVENOUS | Status: AC
  Administered 2015-06-15: 16:00:00 via INTRAVENOUS

## 2015-06-15 MED ORDER — ACETAMINOPHEN 325 MG PO TABS
650.0000 mg | ORAL_TABLET | Freq: Once | ORAL | Status: AC
  Administered 2015-06-15: 650 mg via ORAL
  Filled 2015-06-15: qty 2

## 2015-06-15 MED ORDER — IOHEXOL 300 MG/ML  SOLN
100.0000 mL | Freq: Once | INTRAMUSCULAR | Status: AC | PRN
  Administered 2015-06-15: 100 mL via INTRAVENOUS

## 2015-06-15 MED ORDER — SODIUM CHLORIDE 0.9 % IV SOLN
Freq: Once | INTRAVENOUS | Status: AC
  Administered 2015-06-15: 12:00:00 via INTRAVENOUS

## 2015-06-15 MED ORDER — ONDANSETRON HCL 4 MG/2ML IJ SOLN
4.0000 mg | Freq: Once | INTRAMUSCULAR | Status: AC
  Administered 2015-06-15: 4 mg via INTRAVENOUS
  Filled 2015-06-15: qty 2

## 2015-06-15 NOTE — ED Provider Notes (Addendum)
[facility] Emergency Department Provider Note  ____________________________________________  Time seen: Approximately 11:36 AM  I have reviewed the triage vital signs and the nursing notes.   HISTORY  Chief Complaint Diarrhea; Emesis; and Headache    Victoria Pierce is a 35 y.o. female who complains of feeling ill starting yesterday. She had nausea vomiting diarrhea became very weak and lightheaded had trouble walking or driving straight. Does not remember driving running a fever. Does not remember having blood in the vomit does not think she had any blood in the stool either although she says she has a history of ulcerative colitis. She has her regular smoker's cough which is unchanged. She developed a headache about an hour ago frontal throbbing in nature. She reports she is aching all over her arms legs etc.Nothing she seems to do makes her feel better or worse.   Past Medical History  Diagnosis Date  . Ulcerative colitis   . GERD (gastroesophageal reflux disease)   . Vaginal septum   . Anxiety   . Asthma     Patient Active Problem List   Diagnosis Date Noted  . Ulcerative colitis   . GERD (gastroesophageal reflux disease)   . Vaginal septum   . Anxiety     Past Surgical History  Procedure Laterality Date  . Tubal ligation      Current Outpatient Rx  Name  Route  Sig  Dispense  Refill  . benztropine (COGENTIN) 1 MG tablet   Oral   Take 1 mg by mouth 2 (two) times daily.         Marland Kitchen gabapentin (NEURONTIN) 300 MG capsule   Oral   Take 300 mg by mouth 2 (two) times daily.         . haloperidol (HALDOL) 2 MG tablet   Oral   Take 2 mg by mouth 2 (two) times daily.          . hydrOXYzine (ATARAX/VISTARIL) 50 MG tablet   Oral   Take 50-100 mg by mouth at bedtime.         Marland Kitchen ibuprofen (ADVIL,MOTRIN) 200 MG tablet   Oral   Take 200-400 mg by mouth every 6 (six) hours as needed. For pain.         . Magnesium Oxide 250 MG  TABS   Oral   Take 1 tablet by mouth 2 (two) times daily.         . meloxicam (MOBIC) 7.5 MG tablet   Oral   Take 7.5 mg by mouth daily.         . Multiple Vitamin (MULTIVITAMIN WITH MINERALS) TABS tablet   Oral   Take 1 tablet by mouth daily.         Marland Kitchen oxcarbazepine (TRILEPTAL) 600 MG tablet   Oral   Take 600 mg by mouth 2 (two) times daily.         . ranitidine (ZANTAC) 150 MG tablet   Oral   Take 150 mg by mouth 2 (two) times daily.         Marland Kitchen topiramate (TOPAMAX) 25 MG tablet   Oral   Take 50 mg by mouth 2 (two) times daily.          . vitamin B-12 (CYANOCOBALAMIN) 500 MCG tablet   Oral   Take 500 mcg by mouth daily.         Marland Kitchen EXPIRED: albuterol (PROVENTIL HFA;VENTOLIN HFA) 108 (90 BASE) MCG/ACT inhaler   Inhalation  Inhale 2 puffs into the lungs every 4 (four) hours as needed for wheezing.   1 Inhaler   0   . brompheniramine-pseudoephedrine-DM 30-2-10 MG/5ML syrup   Oral   Take 5 mLs by mouth 4 (four) times daily as needed. Patient not taking: Reported on 06/15/2015   120 mL   0   . busPIRone (BUSPAR) 10 MG tablet   Oral   Take 10 mg by mouth 3 (three) times daily. Reported on 06/15/2015         . cloNIDine (CATAPRES) 0.1 MG tablet   Oral   Take 0.05 mg by mouth 2 (two) times daily. Reported on 06/15/2015         . gabapentin (NEURONTIN) 400 MG capsule   Oral   Take 400 mg by mouth 3 (three) times daily. Reported on 06/15/2015         . HYDROcodone-acetaminophen (NORCO) 5-325 MG per tablet   Oral   Take 1 tablet by mouth every 6 (six) hours as needed. Patient not taking: Reported on 06/15/2015   10 tablet   0   . ibuprofen (ADVIL,MOTRIN) 600 MG tablet   Oral   Take 1 tablet (600 mg total) by mouth every 6 (six) hours as needed. Patient not taking: Reported on 06/15/2015   30 tablet   0   . ibuprofen (ADVIL,MOTRIN) 800 MG tablet   Oral   Take 1 tablet (800 mg total) by mouth every 8 (eight) hours as needed for moderate  pain. Patient not taking: Reported on 06/15/2015   15 tablet   0   . traZODone (DESYREL) 100 MG tablet   Oral   Take 200 mg by mouth at bedtime. Reported on 06/15/2015           Allergies Review of patient's allergies indicates no known allergies.  No family history on file.  Social History Social History  Substance Use Topics  . Smoking status: Current Every Day Smoker -- 0.50 packs/day  . Smokeless tobacco: None  . Alcohol Use: No    Review of Systems Constitutional: No fever/chills Eyes: No visual changes. ENT: No sore throat. Cardiovascular: Denies chest pain. Respiratory: Denies shortness of breath. See history of present illness Genitourinary: Negative for dysuria. Musculoskeletal: Negative for back pain. Skin: Negative for rash. Neurological: Negative for, focal weakness or numbness.  10-point ROS otherwise negative.  ____________________________________________   PHYSICAL EXAM:  VITAL SIGNS: ED Triage Vitals  Enc Vitals Group     BP 06/15/15 1037 137/79 mmHg     Pulse Rate 06/15/15 1037 95     Resp 06/15/15 1037 18     Temp 06/15/15 1037 98.9 F (37.2 C)     Temp Source 06/15/15 1037 Oral     SpO2 06/15/15 1037 100 %     Weight 06/15/15 1037 182 lb (82.555 kg)     Height 06/15/15 1037 5' 2"  (1.575 m)     Head Cir --      Peak Flow --      Pain Score 06/15/15 1037 7     Pain Loc --      Pain Edu? --      Excl. in [address] [place] [address]? --     Constitutional: Alert and oriented. Appears not to be feeling well Eyes: Conjunctivae are normal. PERRL. EOMI. Head: Atraumatic. Nose: No congestion/rhinnorhea. Mouth/Throat: Mucous membranes are moist.  Oropharynx non-erythematous. Neck: No stridor. Cardiovascular: Normal rate, regular rhythm. Grossly normal heart sounds.  Good peripheral circulation. Respiratory: Normal respiratory  effort.  No retractions. Lungs CTAB. Gastrointestinal: Soft and mildly diffusely tender No distention. No abdominal bruits. No CVA  tenderness. Musculoskeletal: No lower extremity tenderness nor edema.  No joint effusions. Neurologic:  Normal speech and language. No gross focal neurologic deficits are appreciated. No gait instability. Skin:  Skin is warm, dry and intact. No rash noted. Psychiatric: Mood and affect are normal. Speech and behavior are normal.  ____________________________________________   LABS (all labs ordered are listed, but only abnormal results are displayed)  Labs Reviewed  URINALYSIS COMPLETEWITH MICROSCOPIC ([place]) - Abnormal; Notable for the following:    Color, Urine STRAW (*)    APPearance CLEAR (*)    Hgb urine dipstick 2+ (*)    Bacteria, UA RARE (*)    Squamous Epithelial / LPF 0-5 (*)    All other components within normal limits  LIPASE, BLOOD  COMPREHENSIVE METABOLIC PANEL  CBC  PREGNANCY, URINE  POCT PREGNANCY, URINE   ____________________________________________  EKG  ____________________________________________  RADIOLOGY  CT scan of the abdomen read by radiology as negative ____________________________________________   PROCEDURES   ____________________________________________   INITIAL IMPRESSION / ASSESSMENT AND PLAN / ED COURSE  Pertinent labs & imaging results that were available during my care of the patient were reviewed by me and considered in my medical decision making (see chart for details).   ____________________________________________   FINAL CLINICAL IMPRESSION(S) / ED DIAGNOSES  Final diagnoses:  Nausea and vomiting, vomiting of unspecified type  Diarrhea, unspecified type  Nonintractable episodic headache, unspecified headache type      Nena Polio, MD 06/15/15 1732 On discharge patient has not had any further nausea vomiting or diarrhea her headache is much better.  Nena Polio, MD 06/15/15 [phone]

## 2015-06-15 NOTE — ED Notes (Signed)
Pt reports that she began yesterday with diarrhea, vomiting and headache. Denies fever, reports fatigue.

## 2015-06-15 NOTE — ED Notes (Signed)
Orthostatic vital signs:  Lying:  BP: 124/81   HR: 92  Sitting:  BP: 131/89 HR: 95  Standing:  BP: 128/86 HR: 100

## 2015-07-08 ENCOUNTER — Emergency Department
Admission: EM | Admit: 2015-07-08 | Discharge: 2015-07-08 | Disposition: A | Discharge status: Home / Self Care | Payer: Self-pay | Attending: Emergency Medicine | Admitting: Emergency Medicine

## 2015-07-08 ENCOUNTER — Encounter: Payer: Self-pay | Admitting: Emergency Medicine

## 2015-07-08 DIAGNOSIS — J45909 Unspecified asthma, uncomplicated: Secondary | ICD-10-CM | POA: Insufficient documentation

## 2015-07-08 DIAGNOSIS — K120 Recurrent oral aphthae: Secondary | ICD-10-CM | POA: Insufficient documentation

## 2015-07-08 DIAGNOSIS — Z79899 Other long term (current) drug therapy: Secondary | ICD-10-CM | POA: Insufficient documentation

## 2015-07-08 DIAGNOSIS — Z7951 Long term (current) use of inhaled steroids: Secondary | ICD-10-CM | POA: Insufficient documentation

## 2015-07-08 DIAGNOSIS — F319 Bipolar disorder, unspecified: Secondary | ICD-10-CM | POA: Insufficient documentation

## 2015-07-08 DIAGNOSIS — F17200 Nicotine dependence, unspecified, uncomplicated: Secondary | ICD-10-CM | POA: Insufficient documentation

## 2015-07-08 DIAGNOSIS — K5190 Ulcerative colitis, unspecified, without complications: Secondary | ICD-10-CM | POA: Insufficient documentation

## 2015-07-08 HISTORY — DX: Bipolar disorder, unspecified: F31.9

## 2015-07-08 MED ORDER — MAGIC MOUTHWASH W/LIDOCAINE: 5.0000 mL | Freq: Four times a day (QID) | ORAL | Status: DC

## 2015-07-08 NOTE — ED Notes (Signed)
Pt to ed with c/o "bumps" on the back of her tongue x 1 week,  Pt reports pain with eating or drinking.

## 2015-07-08 NOTE — ED Provider Notes (Signed)
[facility] Emergency Department Provider Note  ____________________________________________  Time seen: Approximately 10:21 AM  I have reviewed the triage vital signs and the nursing notes.   HISTORY  Chief Complaint Mouth Lesions    HPI Victoria Pierce is a 35 y.o. female also lesions on her tongue. Patient state onset was yesterday. Patient is using over-the-counter mouthwash with seems to worsen complaint. Patient denies any fever or URI signs symptoms associated this complaint. Patient rates the pain as a 6/10. Patient described a pain as "stinging".   Past Medical History  Diagnosis Date  . Ulcerative colitis   . GERD (gastroesophageal reflux disease)   . Vaginal septum   . Anxiety   . Asthma   . Bipolar 1 disorder [facility])     Patient Active Problem List   Diagnosis Date Noted  . Ulcerative colitis   . GERD (gastroesophageal reflux disease)   . Vaginal septum   . Anxiety     Past Surgical History  Procedure Laterality Date  . Tubal ligation      Current Outpatient Rx  Name  Route  Sig  Dispense  Refill  . EXPIRED: albuterol (PROVENTIL HFA;VENTOLIN HFA) 108 (90 BASE) MCG/ACT inhaler   Inhalation   Inhale 2 puffs into the lungs every 4 (four) hours as needed for wheezing.   1 Inhaler   0   . benztropine (COGENTIN) 1 MG tablet   Oral   Take 1 mg by mouth 2 (two) times daily.         . brompheniramine-pseudoephedrine-DM 30-2-10 MG/5ML syrup   Oral   Take 5 mLs by mouth 4 (four) times daily as needed. Patient not taking: Reported on 06/15/2015   120 mL   0   . busPIRone (BUSPAR) 10 MG tablet   Oral   Take 10 mg by mouth 3 (three) times daily. Reported on 06/15/2015         . cloNIDine (CATAPRES) 0.1 MG tablet   Oral   Take 0.05 mg by mouth 2 (two) times daily. Reported on 06/15/2015         . gabapentin (NEURONTIN) 300 MG capsule   Oral   Take 300 mg by mouth 2 (two) times daily.         Marland Kitchen gabapentin (NEURONTIN)  400 MG capsule   Oral   Take 400 mg by mouth 3 (three) times daily. Reported on 06/15/2015         . haloperidol (HALDOL) 2 MG tablet   Oral   Take 2 mg by mouth 2 (two) times daily.          Marland Kitchen HYDROcodone-acetaminophen (NORCO) 5-325 MG per tablet   Oral   Take 1 tablet by mouth every 6 (six) hours as needed. Patient not taking: Reported on 06/15/2015   10 tablet   0   . hydrOXYzine (ATARAX/VISTARIL) 50 MG tablet   Oral   Take 50-100 mg by mouth at bedtime.         Marland Kitchen ibuprofen (ADVIL,MOTRIN) 200 MG tablet   Oral   Take 200-400 mg by mouth every 6 (six) hours as needed. For pain.         Marland Kitchen ibuprofen (ADVIL,MOTRIN) 600 MG tablet   Oral   Take 1 tablet (600 mg total) by mouth every 6 (six) hours as needed. Patient not taking: Reported on 06/15/2015   30 tablet   0   . ibuprofen (ADVIL,MOTRIN) 800 MG tablet   Oral   Take  1 tablet (800 mg total) by mouth every 8 (eight) hours as needed for moderate pain. Patient not taking: Reported on 06/15/2015   15 tablet   0   . magic mouthwash w/lidocaine SOLN   Oral   Take 5 mLs by mouth 4 (four) times daily.   100 mL   0   . Magnesium Oxide 250 MG TABS   Oral   Take 1 tablet by mouth 2 (two) times daily.         . meloxicam (MOBIC) 7.5 MG tablet   Oral   Take 7.5 mg by mouth daily.         . Multiple Vitamin (MULTIVITAMIN WITH MINERALS) TABS tablet   Oral   Take 1 tablet by mouth daily.         Marland Kitchen oxcarbazepine (TRILEPTAL) 600 MG tablet   Oral   Take 600 mg by mouth 2 (two) times daily.         . ranitidine (ZANTAC) 150 MG tablet   Oral   Take 150 mg by mouth 2 (two) times daily.         Marland Kitchen topiramate (TOPAMAX) 25 MG tablet   Oral   Take 50 mg by mouth 2 (two) times daily.          . traZODone (DESYREL) 100 MG tablet   Oral   Take 200 mg by mouth at bedtime. Reported on 06/15/2015         . vitamin B-12 (CYANOCOBALAMIN) 500 MCG tablet   Oral   Take 500 mcg by mouth daily.            Allergies Review of patient's allergies indicates no known allergies.  No family history on file.  Social History Social History  Substance Use Topics  . Smoking status: Current Every Day Smoker -- 0.50 packs/day  . Smokeless tobacco: None  . Alcohol Use: No    Review of Systems Constitutional: No fever/chills Eyes: No visual changes. ENT: No sore throat. Cardiovascular: Denies chest pain. Respiratory: Denies shortness of breath. Gastrointestinal: No abdominal pain.  No nausea, no vomiting.  No diarrhea.  No constipation. Genitourinary: Negative for dysuria. Musculoskeletal: Negative for back pain. Skin: Negative for rash. Neurological: Negative for headaches, focal weakness or numbness. Psychiatric:Anxiety 10-point ROS otherwise negative.  ____________________________________________   PHYSICAL EXAM:  VITAL SIGNS: ED Triage Vitals  Enc Vitals Group     BP 07/08/15 0945 135/87 mmHg     Pulse Rate 07/08/15 0945 92     Resp 07/08/15 0945 20     Temp 07/08/15 0945 98.1 F (36.7 C)     Temp Source 07/08/15 0945 Oral     SpO2 07/08/15 0945 100 %     Weight 07/08/15 0945 182 lb (82.555 kg)     Height 07/08/15 0945 5' 2"  (1.575 m)     Head Cir --      Peak Flow --      Pain Score 07/08/15 0945 6     Pain Loc --      Pain Edu? --      Excl. in [place]? --     Constitutional: Alert and oriented. Well appearing and in no acute distress. Eyes: Conjunctivae are normal. PERRL. EOMI. Head: Atraumatic. Nose: No congestion/rhinnorhea. Mouth/Throat: Mucous membranes are moist.  Oropharynx non-erythematous. Oval ulcer lesion on tongue. Neck: No stridor.  No cervical spine tenderness to palpation. Hematological/Lymphatic/Immunilogical: No cervical lymphadenopathy. Cardiovascular: Normal rate, regular rhythm. Grossly normal heart sounds.  Good  peripheral circulation. Respiratory: Normal respiratory effort.  No retractions. Lungs CTAB. Gastrointestinal: Soft and nontender.  No distention. No abdominal bruits. No CVA tenderness. Musculoskeletal: No lower extremity tenderness nor edema.  No joint effusions. Neurologic:  Normal speech and language. No gross focal neurologic deficits are appreciated. No gait instability. Skin:  Skin is warm, dry and intact. No rash noted. Psychiatric: Mood and affect are normal. Speech and behavior are normal.  ____________________________________________   LABS (all labs ordered are listed, but only abnormal results are displayed)  Labs Reviewed - No data to display ____________________________________________  EKG   ____________________________________________  RADIOLOGY   ____________________________________________   PROCEDURES  Procedure(s) performed: None  Critical Care performed: No  ____________________________________________   INITIAL IMPRESSION / ASSESSMENT AND PLAN / ED COURSE  Pertinent labs & imaging results that were available during my care of the patient were reviewed by me and considered in my medical decision making (see chart for details). Aphthous ulcer. Patient given discharge Instructions. Patient given a prescription for Magic mouthwash. Patient advised to follow-up with "clinic if no improvement or worsening of her condition. ____________________________________________   FINAL CLINICAL IMPRESSION(S) / ED DIAGNOSES  Final diagnoses:  Aphthous ulcer of mouth      Sable Feil, PA-C 07/08/15 1037

## 2015-07-08 NOTE — ED Notes (Signed)
See triage  Noticed bumps to tongue 2 days ago  No fever

## 2015-08-23 ENCOUNTER — Emergency Department: Payer: Self-pay

## 2015-08-23 ENCOUNTER — Emergency Department
Admission: EM | Admit: 2015-08-23 | Discharge: 2015-08-23 | Disposition: A | Discharge status: Home / Self Care | Payer: Self-pay | Attending: Emergency Medicine | Admitting: Emergency Medicine

## 2015-08-23 ENCOUNTER — Encounter: Payer: Self-pay | Admitting: Emergency Medicine

## 2015-08-23 DIAGNOSIS — F17200 Nicotine dependence, unspecified, uncomplicated: Secondary | ICD-10-CM | POA: Insufficient documentation

## 2015-08-23 DIAGNOSIS — J45909 Unspecified asthma, uncomplicated: Secondary | ICD-10-CM | POA: Insufficient documentation

## 2015-08-23 DIAGNOSIS — F319 Bipolar disorder, unspecified: Secondary | ICD-10-CM | POA: Insufficient documentation

## 2015-08-23 DIAGNOSIS — Z79899 Other long term (current) drug therapy: Secondary | ICD-10-CM | POA: Insufficient documentation

## 2015-08-23 DIAGNOSIS — R202 Paresthesia of skin: Secondary | ICD-10-CM | POA: Insufficient documentation

## 2015-08-23 DIAGNOSIS — R079 Chest pain, unspecified: Secondary | ICD-10-CM

## 2015-08-23 DIAGNOSIS — F1299 Cannabis use, unspecified with unspecified cannabis-induced disorder: Secondary | ICD-10-CM | POA: Insufficient documentation

## 2015-08-23 LAB — CBC
HCT: 39.2 % (ref 35.0–47.0)
Hemoglobin: 13 g/dL (ref 12.0–16.0)
MCH: 31.8 pg (ref 26.0–34.0)
MCHC: 33.2 g/dL (ref 32.0–36.0)
MCV: 95.7 fL (ref 80.0–100.0)
PLATELETS: 219 10*3/uL (ref 150–440)
RBC: 4.1 MIL/uL (ref 3.80–5.20)
RDW: 14.9 % — AB (ref 11.5–14.5)
WBC: 8.6 10*3/uL (ref 3.6–11.0)

## 2015-08-23 LAB — BASIC METABOLIC PANEL
Anion gap: 9 (ref 5–15)
BUN: 22 mg/dL — AB (ref 6–20)
CALCIUM: 9.1 mg/dL (ref 8.9–10.3)
CO2: 22 mmol/L (ref 22–32)
CREATININE: 1.11 mg/dL — AB (ref 0.44–1.00)
Chloride: 109 mmol/L (ref 101–111)
GFR calc non Af Amer: >60 mL/min (ref >60)
Glucose, Bld: 95 mg/dL (ref 65–99)
Potassium: 4.2 mmol/L (ref 3.5–5.1)
SODIUM: 140 mmol/L (ref 135–145)

## 2015-08-23 LAB — TROPONIN I

## 2015-08-23 MED ORDER — BUTALBITAL-APAP-CAFFEINE 50-325-40 MG PO TABS: 1.0000 | ORAL_TABLET | Freq: Four times a day (QID) | ORAL | Status: AC | PRN

## 2015-08-23 MED ORDER — BUTALBITAL-APAP-CAFFEINE 50-325-40 MG PO TABS
2.0000 | ORAL_TABLET | Freq: Once | ORAL | Status: AC
  Administered 2015-08-23: 2 via ORAL

## 2015-08-23 MED ORDER — BUTALBITAL-APAP-CAFFEINE 50-325-40 MG PO TABS
ORAL_TABLET | ORAL | Status: AC
  Filled 2015-08-23: qty 2

## 2015-08-23 NOTE — ED Notes (Signed)
Pt. Going home by self will follow up with PCP tomorrow.

## 2015-08-23 NOTE — ED Notes (Signed)
Patient ambulatory to triage with steady gait, without difficulty or distress noted; pt reports "my right ear is completely numb, into my face, head, arm"; also st mid CP x hours; denies radiating pain, denies accomp symptoms; denies hx of same; pt A&Ox3, PERRL, MAEW with good strong equal grips

## 2015-08-23 NOTE — ED Notes (Signed)
Pt. States many home stressors.

## 2015-08-23 NOTE — Discharge Instructions (Signed)
You have been seen in the emergency department today for chest pain and right facial/arm numbness/tingling. Your workup has shown normal results. As we discussed please follow-up with your primary care physician in the next 1-2 days for recheck. Return to the emergency department for any further chest pain, trouble breathing, or any worsening numbness, any facial droop, weakness of any arm or leg, confusion or slurred speech.   Paresthesia Paresthesia is an abnormal burning or prickling sensation. This sensation is generally felt in the hands, arms, legs, or feet. However, it may occur in any part of the body. Usually, it is not painful. The feeling may be described as:  Tingling or numbness.  Pins and needles.  Skin crawling.  Buzzing.  Limbs falling asleep.  Itching. Most people experience temporary (transient) paresthesia at some time in their lives. Paresthesia may occur when you breathe too quickly (hyperventilation). It can also occur without any apparent cause. Commonly, paresthesia occurs when pressure is placed on a nerve. The sensation quickly goes away after the pressure is removed. For some people, however, paresthesia is a long-lasting (chronic) condition that is caused by an underlying disorder. If you continue to have paresthesia, you may need further medical evaluation. HOME CARE INSTRUCTIONS Watch your condition for any changes. Taking the following actions may help to lessen any discomfort that you are feeling:  Avoid drinking alcohol.  Try acupuncture or massage to help relieve your symptoms.  Keep all follow-up visits as directed by your health care provider. This is important. SEEK MEDICAL CARE IF:  You continue to have episodes of paresthesia.  Your burning or prickling feeling gets worse when you walk.  You have pain, cramps, or dizziness.  You develop a rash. SEEK IMMEDIATE MEDICAL CARE IF:  You feel weak.  You have trouble walking or moving.  You have  problems with speech, understanding, or vision.  You feel confused.  You cannot control your bladder or bowel movements.  You have numbness after an injury.  You faint.   This information is not intended to replace advice given to you by your health care provider. Make sure you discuss any questions you have with your health care provider.   Document Released: 03/24/2002 Document Revised: 08/18/2014 Document Reviewed: 03/30/2014 Elsevier Interactive Patient Education 2016 [employer]  Nonspecific Chest Pain It is often hard to find the cause of chest pain. There is always a chance that your pain could be related to something serious, such as a heart attack or a blood clot in your lungs. Chest pain can also be caused by conditions that are not life-threatening. If you have chest pain, it is very important to follow up with your doctor.  HOME CARE  If you were prescribed an antibiotic medicine, finish it all even if you start to feel better.  Avoid any activities that cause chest pain.  Do not use any tobacco products, including cigarettes, chewing tobacco, or electronic cigarettes. If you need help quitting, ask your doctor.  Do not drink alcohol.  Take medicines only as told by your doctor.  Keep all follow-up visits as told by your doctor. This is important. This includes any further testing if your chest pain does not go away.  Your doctor may tell you to keep your head raised (elevated) while you sleep.  Make lifestyle changes as told by your doctor. These may include:  Getting regular exercise. Ask your doctor to suggest some activities that are safe for you.  Eating a heart-healthy  diet. Your doctor or a diet specialist (dietitian) can help you to learn healthy eating options.  Maintaining a healthy weight.  Managing diabetes, if necessary.  Reducing stress. GET HELP IF:  Your chest pain does not go away, even after treatment.  You have a rash with blisters on  your chest.  You have a fever. GET HELP RIGHT AWAY IF:  Your chest pain is worse.  You have an increasing cough, or you cough up blood.  You have severe belly (abdominal) pain.  You feel extremely weak.  You pass out (faint).  You have chills.  You have sudden, unexplained chest discomfort.  You have sudden, unexplained discomfort in your arms, back, neck, or jaw.  You have shortness of breath at any time.  You suddenly start to sweat, or your skin gets clammy.  You feel nauseous.  You vomit.  You suddenly feel light-headed or dizzy.  Your heart begins to beat quickly, or it feels like it is skipping beats. These symptoms may be an emergency. Do not wait to see if the symptoms will go away. Get medical help right away. Call your local emergency services (911 in the [place] Do not drive yourself to the hospital.   This information is not intended to replace advice given to you by your health care provider. Make sure you discuss any questions you have with your health care provider.   Document Released: 09/20/2007 Document Revised: 04/24/2014 Document Reviewed: 11/07/2013 Elsevier Interactive Patient Education [employer].

## 2015-08-23 NOTE — ED Provider Notes (Signed)
[facility] Emergency Department Provider Note  Time seen: 10:01 PM  I have reviewed the triage vital signs and the nursing notes.   HISTORY  Chief Complaint Chest Pain    HPI Victoria Pierce is a 35 y.o. female with a past medical history of gastric reflux, ulcerative colitis, bipolar, who presents the emergency department with chest discomfort and facial numbness. According to the patient for the past 2 days she has been experiencing and numbness/tingling sensation in her right face, right shoulder and right arm. She also states today she developed chest pain/tightness. Denies any trouble breathing, nausea or diaphoresis. Denies any pain in her face or arm. She also states she has a moderate headache has been present for the past 2 days. She has a history of migraines for which she takes Topamax. Denies any fever.     Past Medical History  Diagnosis Date  . Ulcerative colitis   . GERD (gastroesophageal reflux disease)   . Vaginal septum   . Anxiety   . Asthma   . Bipolar 1 disorder [facility])     Patient Active Problem List   Diagnosis Date Noted  . Ulcerative colitis   . GERD (gastroesophageal reflux disease)   . Vaginal septum   . Anxiety     Past Surgical History  Procedure Laterality Date  . Tubal ligation      Current Outpatient Rx  Name  Route  Sig  Dispense  Refill  . EXPIRED: albuterol (PROVENTIL HFA;VENTOLIN HFA) 108 (90 BASE) MCG/ACT inhaler   Inhalation   Inhale 2 puffs into the lungs every 4 (four) hours as needed for wheezing.   1 Inhaler   0   . benztropine (COGENTIN) 1 MG tablet   Oral   Take 1 mg by mouth 2 (two) times daily.         . brompheniramine-pseudoephedrine-DM 30-2-10 MG/5ML syrup   Oral   Take 5 mLs by mouth 4 (four) times daily as needed. Patient not taking: Reported on 06/15/2015   120 mL   0   . busPIRone (BUSPAR) 10 MG tablet   Oral   Take 10 mg by mouth 3 (three) times daily. Reported on  06/15/2015         . cloNIDine (CATAPRES) 0.1 MG tablet   Oral   Take 0.05 mg by mouth 2 (two) times daily. Reported on 06/15/2015         . gabapentin (NEURONTIN) 300 MG capsule   Oral   Take 300 mg by mouth 2 (two) times daily.         Marland Kitchen gabapentin (NEURONTIN) 400 MG capsule   Oral   Take 400 mg by mouth 3 (three) times daily. Reported on 06/15/2015         . haloperidol (HALDOL) 2 MG tablet   Oral   Take 2 mg by mouth 2 (two) times daily.          Marland Kitchen HYDROcodone-acetaminophen (NORCO) 5-325 MG per tablet   Oral   Take 1 tablet by mouth every 6 (six) hours as needed. Patient not taking: Reported on 06/15/2015   10 tablet   0   . hydrOXYzine (ATARAX/VISTARIL) 50 MG tablet   Oral   Take 50-100 mg by mouth at bedtime.         Marland Kitchen ibuprofen (ADVIL,MOTRIN) 200 MG tablet   Oral   Take 200-400 mg by mouth every 6 (six) hours as needed. For pain.         Marland Kitchen  ibuprofen (ADVIL,MOTRIN) 600 MG tablet   Oral   Take 1 tablet (600 mg total) by mouth every 6 (six) hours as needed. Patient not taking: Reported on 06/15/2015   30 tablet   0   . ibuprofen (ADVIL,MOTRIN) 800 MG tablet   Oral   Take 1 tablet (800 mg total) by mouth every 8 (eight) hours as needed for moderate pain. Patient not taking: Reported on 06/15/2015   15 tablet   0   . magic mouthwash w/lidocaine SOLN   Oral   Take 5 mLs by mouth 4 (four) times daily.   100 mL   0   . Magnesium Oxide 250 MG TABS   Oral   Take 1 tablet by mouth 2 (two) times daily.         . meloxicam (MOBIC) 7.5 MG tablet   Oral   Take 7.5 mg by mouth daily.         . Multiple Vitamin (MULTIVITAMIN WITH MINERALS) TABS tablet   Oral   Take 1 tablet by mouth daily.         Marland Kitchen oxcarbazepine (TRILEPTAL) 600 MG tablet   Oral   Take 600 mg by mouth 2 (two) times daily.         . ranitidine (ZANTAC) 150 MG tablet   Oral   Take 150 mg by mouth 2 (two) times daily.         Marland Kitchen topiramate (TOPAMAX) 25 MG tablet   Oral    Take 50 mg by mouth 2 (two) times daily.          . traZODone (DESYREL) 100 MG tablet   Oral   Take 200 mg by mouth at bedtime. Reported on 06/15/2015         . vitamin B-12 (CYANOCOBALAMIN) 500 MCG tablet   Oral   Take 500 mcg by mouth daily.           Allergies Review of patient's allergies indicates no known allergies.  No family history on file.  Social History Social History  Substance Use Topics  . Smoking status: Current Every Day Smoker -- 1.00 packs/day  . Smokeless tobacco: None  . Alcohol Use: No    Review of Systems Constitutional: Negative for fever. Cardiovascular: Negative for chest pain. Respiratory: Negative for shortness of breath. Gastrointestinal: Negative for abdominal pain Musculoskeletal: Negative for back pain. Neurological: Moderate headache, numbness/tingling to the right face, right shoulder and right arm. 10-point ROS otherwise negative.  ____________________________________________   PHYSICAL EXAM:  VITAL SIGNS: ED Triage Vitals  Enc Vitals Group     BP 08/23/15 2047 133/87 mmHg     Pulse Rate 08/23/15 2047 85     Resp 08/23/15 2047 18     Temp 08/23/15 2047 98.5 F (36.9 C)     Temp Source 08/23/15 2047 Oral     SpO2 08/23/15 2047 100 %     Weight 08/23/15 2047 185 lb (83.915 kg)     Height 08/23/15 2047 5' 2"  (1.575 m)     Head Cir --      Peak Flow --      Pain Score 08/23/15 2051 7     Pain Loc --      Pain Edu? --      Excl. in [place]? --     Constitutional: Alert and oriented. Well appearing and in no distress. Eyes: Normal exam ENT   Head: Normocephalic and atraumatic.Tympanic membrane. No facial rash.   Mouth/Throat: Mucous  membranes are moist. Cardiovascular: Normal rate, regular rhythm. No murmur Respiratory: Normal respiratory effort without tachypnea nor retractions. Breath sounds are clear  Gastrointestinal: Soft and nontender. No distention.   Musculoskeletal: Nontender with normal range of motion in  all extremities.  Neurologic:  Normal speech and language. No gross focal neurologic deficits are appreciated. Patient states a tingling sensation but intact sensation in her right upper extremity. 5/5 motor in   all extremities, grip strength is equal, no pronator drift. Cranial nerves intact but she does state a tingling sensation when touching the right side of her face. Skin:  Skin is warm, dry and intact.  No rash. Psychiatric: Mood and affect are normal. Speech and behavior are normal.  ____________________________________________    INITIAL IMPRESSION / ASSESSMENT AND PLAN / ED COURSE  Pertinent labs & imaging results that were available during my care of the patient were reviewed by me and considered in my medical decision making (see chart for details).  Patient presents the emergency department with 2 days of right facial and arm tingling/paresthesias. Also chest pain starting today. Patient's labs including troponin are negative. Chest x-rays negative. Patient has an intact neurologic exam and appears very well. Patient has a moderate headache, which could indicate possible complex migraine. I discussed with the patient trial of headache medication. If her symptoms resolve with a headache medication that she could follow-up with her primary care physician. If her symptoms remain and she is to return to the emergency department, if she develops any weakness of any point or she finds her symptoms worsening she will also return to the emergency department.  ____________________________________________   FINAL CLINICAL IMPRESSION(S) / ED DIAGNOSES  paresthesia   Harvest Dark, MD 08/23/15 2212

## 2015-08-23 NOTE — ED Notes (Addendum)
Pt. States lt. Sided chest pain that started about two hours ago.  Pt. Also states "tingling" to right side of body that started yesterday.  Pt. States no cardiac hx.  Pt. States she does not medication for anxiety. Pt. Also states HA to lt. Side of head.

## 2015-09-29 ENCOUNTER — Encounter (HOSPITAL_COMMUNITY): Payer: Self-pay | Admitting: Nurse Practitioner

## 2015-09-29 ENCOUNTER — Emergency Department (HOSPITAL_COMMUNITY): Payer: Self-pay

## 2015-09-29 ENCOUNTER — Emergency Department (HOSPITAL_COMMUNITY)
Admission: EM | Admit: 2015-09-29 | Discharge: 2015-09-30 | Disposition: A | Discharge status: Home / Self Care | Payer: Self-pay | Attending: Emergency Medicine | Admitting: Emergency Medicine

## 2015-09-29 DIAGNOSIS — J45909 Unspecified asthma, uncomplicated: Secondary | ICD-10-CM | POA: Insufficient documentation

## 2015-09-29 DIAGNOSIS — R0789 Other chest pain: Secondary | ICD-10-CM | POA: Insufficient documentation

## 2015-09-29 DIAGNOSIS — Z7982 Long term (current) use of aspirin: Secondary | ICD-10-CM | POA: Insufficient documentation

## 2015-09-29 DIAGNOSIS — F319 Bipolar disorder, unspecified: Secondary | ICD-10-CM | POA: Insufficient documentation

## 2015-09-29 DIAGNOSIS — R0602 Shortness of breath: Secondary | ICD-10-CM

## 2015-09-29 DIAGNOSIS — F17200 Nicotine dependence, unspecified, uncomplicated: Secondary | ICD-10-CM | POA: Insufficient documentation

## 2015-09-29 DIAGNOSIS — R079 Chest pain, unspecified: Secondary | ICD-10-CM

## 2015-09-29 DIAGNOSIS — Z79899 Other long term (current) drug therapy: Secondary | ICD-10-CM | POA: Insufficient documentation

## 2015-09-29 LAB — BASIC METABOLIC PANEL
Anion gap: 7 (ref 5–15)
BUN: 12 mg/dL (ref 6–20)
CALCIUM: 9.1 mg/dL (ref 8.9–10.3)
CO2: 21 mmol/L — ABNORMAL LOW (ref 22–32)
CREATININE: 0.94 mg/dL (ref 0.44–1.00)
Chloride: 111 mmol/L (ref 101–111)
GFR calc Af Amer: >60 mL/min (ref >60)
GFR calc non Af Amer: >60 mL/min (ref >60)
GLUCOSE: 99 mg/dL (ref 65–99)
POTASSIUM: 3.7 mmol/L (ref 3.5–5.1)
SODIUM: 139 mmol/L (ref 135–145)

## 2015-09-29 LAB — CBC
HEMATOCRIT: 40.2 % (ref 36.0–46.0)
Hemoglobin: 13.3 g/dL (ref 12.0–15.0)
MCH: 31.4 pg (ref 26.0–34.0)
MCHC: 33.1 g/dL (ref 30.0–36.0)
MCV: 95 fL (ref 78.0–100.0)
PLATELETS: 235 10*3/uL (ref 150–400)
RBC: 4.23 MIL/uL (ref 3.87–5.11)
RDW: 12.5 % (ref 11.5–15.5)
WBC: 9 10*3/uL (ref 4.0–10.5)

## 2015-09-29 LAB — I-STAT TROPONIN, ED: TROPONIN I, POC: 0 ng/mL (ref 0.00–0.08)

## 2015-09-29 MED ORDER — LORAZEPAM 2 MG/ML IJ SOLN
0.5000 mg | Freq: Once | INTRAMUSCULAR | Status: AC
  Administered 2015-09-29: 0.5 mg via INTRAVENOUS
  Filled 2015-09-29: qty 1

## 2015-09-29 MED ORDER — LORAZEPAM 0.5 MG PO TABS: 0.5000 mg | ORAL_TABLET | Freq: Three times a day (TID) | ORAL | Status: DC | PRN

## 2015-09-29 NOTE — ED Provider Notes (Signed)
CSN: [id number]     Arrival date & time 09/29/15  1801 History   First MD Initiated Contact with Patient 09/29/15 2155     Chief Complaint  Patient presents with  . Chest Pain     (Consider location/radiation/quality/duration/timing/severity/associated sxs/prior Treatment) HPI Comments: Patient is a 35 year old female with history of anxiety who presents with a one-week history of left-sided chest pain. Patient reports her pain began as a spasm that has become now a steady pressure under her left breast. Patient has had associated lightheadedness and shortness of breath and shakiness. Patient states she first thought this may have been gas that she gets gas pains in this area oftentimes, however the pain never went away and the patient had a bowel movement which usually relieves her symptoms. Patient believes this may be due to her anxiety, however she has never experienced her anxiety in this way. Patient states her pain increases when she gets upset. Patient has had an increased amount of stress in her life lately and has been feeling very anxious. Patient has not taken any medications at home other than her daily medications. Patient takes Haldol for her anxiety, but does not have any benzodiazepines for PRN use. Patient is seen by family services where she sees psychiatry. This service does not prescribe benzodiazepines for the patients. Patient denies any fevers, nausea, vomiting, diarrhea. Patient reports some intermittent left lower quadrant spasm over the past few days on review of systems. She denies having this feeling now. Patient denies any recent long trips, recent surgeries or cancer, or OCP/estrogen replacement.  Patient is a 35 y.o. female presenting with chest pain. The history is provided by the patient.  Chest Pain Associated symptoms: shortness of breath   Associated symptoms: no abdominal pain, no back pain, no fever, no headache, no nausea and not vomiting     Past Medical  History  Diagnosis Date  . Ulcerative colitis   . GERD (gastroesophageal reflux disease)   . Vaginal septum   . Anxiety   . Asthma   . Bipolar 1 disorder [facility])    Past Surgical History  Procedure Laterality Date  . Tubal ligation     History reviewed. No pertinent family history. Social History  Substance Use Topics  . Smoking status: Current Every Day Smoker -- 1.00 packs/day  . Smokeless tobacco: None  . Alcohol Use: No   OB History    No data available     Review of Systems  Constitutional: Negative for fever and chills.  HENT: Negative for facial swelling and sore throat.   Respiratory: Positive for shortness of breath.   Cardiovascular: Positive for chest pain.  Gastrointestinal: Negative for nausea, vomiting and abdominal pain.  Genitourinary: Negative for dysuria.  Musculoskeletal: Negative for back pain.  Skin: Negative for rash and wound.  Neurological: Positive for light-headedness. Negative for headaches.  Psychiatric/Behavioral: The patient is not nervous/anxious.       Allergies  Review of patient's allergies indicates no known allergies.  Home Medications   Prior to Admission medications   Medication Sig Start Date End Date Taking? Authorizing Provider  Aspirin-Salicylamide-Caffeine (BC FAST PAIN RELIEF) [phone] MG PACK Take 1-2 Packages by mouth 5 (five) times daily as needed (for pain).    Yes Historical Provider, MD  benztropine (COGENTIN) 1 MG tablet Take 1 mg by mouth 2 (two) times daily.   Yes Historical Provider, MD  gabapentin (NEURONTIN) 300 MG capsule Take 300 mg by mouth 3 (three) times daily.  Yes Historical Provider, MD  haloperidol (HALDOL) 2 MG tablet Take 2 mg by mouth 2 (two) times daily.    Yes Historical Provider, MD  hydrOXYzine (ATARAX/VISTARIL) 50 MG tablet Take 100 mg by mouth at bedtime.    Yes Historical Provider, MD  Magnesium Oxide 250 MG TABS Take 1 tablet by mouth 2 (two) times daily.   Yes Historical Provider, MD   meloxicam (MOBIC) 7.5 MG tablet Take 7.5 mg by mouth 2 (two) times daily.    Yes Historical Provider, MD  Multiple Vitamin (MULTIVITAMIN WITH MINERALS) TABS tablet Take 1 tablet by mouth daily.   Yes Historical Provider, MD  oxcarbazepine (TRILEPTAL) 600 MG tablet Take 600 mg by mouth 3 (three) times daily.    Yes Historical Provider, MD  ranitidine (ZANTAC) 150 MG tablet Take 150 mg by mouth 2 (two) times daily.   Yes Historical Provider, MD  topiramate (TOPAMAX) 25 MG tablet Take 50 mg by mouth 2 (two) times daily.    Yes Historical Provider, MD  vitamin B-12 (CYANOCOBALAMIN) 500 MCG tablet Take 500 mcg by mouth daily.   Yes Historical Provider, MD  albuterol (PROVENTIL HFA;VENTOLIN HFA) 108 (90 BASE) MCG/ACT inhaler Inhale 2 puffs into the lungs every 4 (four) hours as needed for wheezing. 08/02/11 08/01/12  Ezequiel Essex, MD  brompheniramine-pseudoephedrine-DM 30-2-10 MG/5ML syrup Take 5 mLs by mouth 4 (four) times daily as needed. Patient not taking: Reported on 06/15/2015 01/31/15   Sable Feil, PA-C  butalbital-acetaminophen-caffeine [facility]) [phone] MG tablet Take 1-2 tablets by mouth every 6 (six) hours as needed for headache. 08/23/15 08/22/16  Harvest Dark, MD  HYDROcodone-acetaminophen (NORCO) 5-325 MG per tablet Take 1 tablet by mouth every 6 (six) hours as needed. Patient not taking: Reported on 06/15/2015 11/16/13   Jeannett Senior, PA-C  ibuprofen (ADVIL,MOTRIN) 600 MG tablet Take 1 tablet (600 mg total) by mouth every 6 (six) hours as needed. Patient not taking: Reported on 06/15/2015 11/16/13   Jeannett Senior, PA-C  ibuprofen (ADVIL,MOTRIN) 800 MG tablet Take 1 tablet (800 mg total) by mouth every 8 (eight) hours as needed for moderate pain. Patient not taking: Reported on 06/15/2015 01/31/15   Sable Feil, PA-C  LORazepam (ATIVAN) 0.5 MG tablet Take 1 tablet (0.5 mg total) by mouth 3 (three) times daily as needed for anxiety. 09/29/15   Frederica Kuster, PA-C  magic  mouthwash w/lidocaine SOLN Take 5 mLs by mouth 4 (four) times daily. 07/08/15   Sable Feil, PA-C   BP 114/70 mmHg  Pulse 86  Temp(Src) 97.7 F (36.5 C) (Oral)  Resp 18  SpO2 99% Physical Exam  Constitutional: She appears well-developed and well-nourished. No distress.  Patient with mild fine tremor  HENT:  Head: Normocephalic and atraumatic.  Mouth/Throat: Oropharynx is clear and moist. No oropharyngeal exudate.  Eyes: Conjunctivae are normal. Pupils are equal, round, and reactive to light. Right eye exhibits no discharge. Left eye exhibits no discharge. No scleral icterus.  Neck: Normal range of motion. Neck supple. No thyromegaly present.  Cardiovascular: Normal rate, regular rhythm, normal heart sounds and intact distal pulses.  Exam reveals no gallop and no friction rub.   No murmur heard. Pulmonary/Chest: Effort normal and breath sounds normal. No stridor. No respiratory distress. She has no wheezes. She has no rales. She exhibits no tenderness.  Abdominal: Soft. Bowel sounds are normal. She exhibits no distension. There is no tenderness. There is no rebound and no guarding.  No abdominal tenderness noted on exam, especially  to the left lower quadrant  Musculoskeletal: She exhibits no edema.  Lymphadenopathy:    She has no cervical adenopathy.  Neurological: She is alert. Coordination normal.  Skin: Skin is warm and dry. No rash noted. She is not diaphoretic. No pallor.  Psychiatric: She has a normal mood and affect.  Nursing note and vitals reviewed.   ED Course  Procedures (including critical care time) Labs Review Labs Reviewed  BASIC METABOLIC PANEL - Abnormal; Notable for the following:    CO2 21 (*)    All other components within normal limits  CBC  I-STAT TROPOININ, ED    Imaging Review Dg Chest 2 View  09/29/2015  CLINICAL DATA:  Left-sided chest pain for 1 week. EXAM: CHEST  2 VIEW COMPARISON:  08/23/2015 and 01/31/2015. FINDINGS: The heart size and  mediastinal contours are normal. The lungs are clear. There is no pleural effusion or pneumothorax. No acute osseous findings are identified. IMPRESSION: Stable chest.  No active cardiopulmonary process. Electronically Signed   By: Richardean Sale M.D.   On: 09/29/2015 19:30   I have personally reviewed and evaluated these images and lab results as part of my medical decision-making.   EKG Interpretation   Date/Time:  Wednesday September 29 2015 18:04:50 EDT Ventricular Rate:  101 PR Interval:  124 QRS Duration: 84 QT Interval:  332 QTC Calculation: 430 R Axis:   75 Text Interpretation:  Sinus tachycardia Right atrial enlargement  Borderline ECG Confirmed by Alvino Chapel  MD, Ovid Curd [phone]) on 09/29/2015  10:48:48 PM      MDM   Patient presenting with 1 week of chest pain and shortness of breath. CBC, BMP unremarkable. Troponin 0.00. CXR shows stable chest, no active cardiopulmonary disease. EKG shows sinus tachycardia (101), right atrial enlargement. PERC negative. Patient given 0.5 mg Ativan in ED with immediate resolution of symptoms. Patient states her stress level immediately decreased and her chest pain, shortness of breath, and fine tremor resolved. I will discharge patient with a small amount of Ativan with follow-up to her psychiatrist and PCP for follow-up and further evaluation of her anxiety. I also included outpatient behavioral health resources if patient would like to seek psychiatric treatment elsewhere. Patient with tubal ligation. Patient discussed with Dr. Alvino Chapel who is in agreement with plan. Patient vitals stable throughout ED course and discharged in satisfactory condition.  Final diagnoses:  Chest pain, unspecified chest pain type  Shortness of breath       Frederica Kuster, PA-C 09/29/15 2359  Davonna Belling, MD 09/30/15 [phone]

## 2015-09-29 NOTE — Discharge Instructions (Signed)
Medications: Ativan  Treatment: Take Ativan up to 3 times daily as needed for your anxiety symptoms. Continue taking your at home medications as prescribed. Find ways to decrease the stress in your life, such as yoga, massages, deep breathing exercises.  Follow-up: Please follow up with your psychiatrist and primary care provider for follow-up of today's visit and further evaluation of your anxiety symptoms. Please return to emergency department if you develop any new or worsening symptoms.   Outpatient Psychiatry and Counseling  [facility]: Mobile Crisis Management 24 hours:  [phone]  [facility] [employer] sliding scale fee and walk in schedule: M-F 8am-12pm/1pm-3pm [place], [place] [address] [address] [place], [place] [address] [phone]  [facility])- new patient walk-in appointments available Monday - Friday 8am -3pm.          [address] [place], [place] [address] [phone] or crisis line- [facility]/ Intensive Outpatient Therapy Program [place], [place] [address] [place]      [phone] [address] [place], [place] [address]                 [place]   [facility] [phone]. [place], [place] [address]   [facility]          [address] Johnette Abraham  [place], [place] [address]       [phone]  [facility] [address], [place], [place] [address] [phone]  [facility]    [address] [place], [place] [address]     [place], [place] Joycelyn Man     [place] [address]     [phone]        [facility] [place] [address]  [facility]     [address], [place], [place], [place] [address] [place]     [address]     [place], [place] [address]     [phone]       [facility] [address] [place], [place] [address] [phone] Resources for Temporary Residential Assistance/Crisis [place] [facility]) M-F 8am-3pm   [address], [place] [address]   [phone] Services include: laundry, barbering, support groups, case management, phone  & computer access, showers, AA/NA mtgs, mental health/substance abuse nurse, job skills class, disability information, VA assistance, spiritual classes, etc.                  Nonspecific Chest Pain  Chest pain can be caused by many different conditions. There is always a chance that your pain could be related to something serious, such as a heart attack or a blood clot in your lungs. Chest pain can also be caused by conditions that are not life-threatening.  If you have chest pain, it is very important to follow up with your health care provider. CAUSES  Chest pain can be caused by:  Heartburn.  Pneumonia or bronchitis.  Anxiety or stress.  Inflammation around your heart (pericarditis) or lung (pleuritis or pleurisy).  A blood clot in your lung.  A collapsed lung (pneumothorax). It can develop suddenly on its own (spontaneous pneumothorax) or from trauma to the chest.  Shingles infection (varicella-zoster virus).  Heart attack.  Damage to the bones, muscles, and cartilage that make up your chest wall. This can include:  Bruised bones due to injury.  Strained muscles or cartilage due to frequent or repeated coughing or overwork.  Fracture to one or more  ribs.  Sore cartilage due to inflammation (costochondritis). RISK FACTORS  Risk factors for chest pain may include:  Activities that increase your risk for trauma or injury to your chest.  Respiratory infections or conditions that cause frequent coughing.  Medical conditions or overeating that can cause heartburn.  Heart disease or family history of heart disease.  Conditions or health behaviors that increase your risk of developing a blood clot.  Having had chicken pox (varicella zoster). SIGNS AND SYMPTOMS Chest pain can feel like:  Burning or tingling on the surface of your chest or deep in your chest.  Crushing, pressure, aching, or squeezing pain.  Dull or sharp pain that is worse when you move, cough, or take a deep breath.  Pain that is also felt in your back, neck, shoulder, or arm, or pain that spreads to any of these areas. Your chest pain may come and go, or it may stay constant. DIAGNOSIS Lab tests or other studies may be needed to find the cause of your pain. Your health care provider may have you take a test called an ambulatory ECG (electrocardiogram). An ECG records your heartbeat patterns at the time the test is performed. You may also have other tests, such as:  Transthoracic echocardiogram (TTE). During echocardiography, sound waves are used to create a picture of all of the heart structures and to look at how blood flows through your heart.  Transesophageal echocardiogram (TEE).This is a more advanced imaging test that obtains images from inside your body. It allows your health care provider to see your heart in finer detail.  Cardiac monitoring. This allows your health care provider to monitor your heart rate and rhythm in real time.  Holter monitor. This is a portable device that records your heartbeat and can help to diagnose abnormal heartbeats. It allows your health care provider to track your heart activity for several days, if needed.  Stress tests.  These can be done through exercise or by taking medicine that makes your heart beat more quickly.  Blood tests.  Imaging tests. TREATMENT  Your treatment depends on what is causing your chest pain. Treatment may include:  Medicines. These may include:  Acid blockers for heartburn.  Anti-inflammatory medicine.  Pain medicine for inflammatory conditions.  Antibiotic medicine, if an infection is present.  Medicines to dissolve blood clots.  Medicines to treat coronary artery disease.  Supportive care for conditions that do not require medicines. This may include:  Resting.  Applying heat or cold packs to injured areas.  Limiting activities until pain decreases. HOME CARE INSTRUCTIONS  If you were prescribed an antibiotic medicine, finish it all even if you start to feel better.  Avoid any activities that bring on chest pain.  Do not use any tobacco products, including  cigarettes, chewing tobacco, or electronic cigarettes. If you need help quitting, ask your health care provider.  Do not drink alcohol.  Take medicines only as directed by your health care provider.  Keep all follow-up visits as directed by your health care provider. This is important. This includes any further testing if your chest pain does not go away.  If heartburn is the cause for your chest pain, you may be told to keep your head raised (elevated) while sleeping. This reduces the chance that acid will go from your stomach into your esophagus.  Make lifestyle changes as directed by your health care provider. These may include:  Getting regular exercise. Ask your health care provider to suggest some activities that are safe for you.  Eating a heart-healthy diet. A registered dietitian can help you to learn healthy eating options.  Maintaining a healthy weight.  Managing diabetes, if necessary.  Reducing stress. SEEK MEDICAL CARE IF:  Your chest pain does not go away after treatment.  You have  a rash with blisters on your chest.  You have a fever. SEEK IMMEDIATE MEDICAL CARE IF:   Your chest pain is worse.  You have an increasing cough, or you cough up blood.  You have severe abdominal pain.  You have severe weakness.  You faint.  You have chills.  You have sudden, unexplained chest discomfort.  You have sudden, unexplained discomfort in your arms, back, neck, or jaw.  You have shortness of breath at any time.  You suddenly start to sweat, or your skin gets clammy.  You feel nauseous or you vomit.  You suddenly feel light-headed or dizzy.  Your heart begins to beat quickly, or it feels like it is skipping beats. These symptoms may represent a serious problem that is an emergency. Do not wait to see if the symptoms will go away. Get medical help right away. Call your local emergency services (911 in the [place] Do not drive yourself to the hospital.   This information is not intended to replace advice given to you by your health care provider. Make sure you discuss any questions you have with your health care provider.   Document Released: 01/11/2005 Document Revised: 04/24/2014 Document Reviewed: 11/07/2013 Elsevier Interactive Patient Education [employer].

## 2015-09-29 NOTE — ED Notes (Signed)
She c/o 1 week history of CP. She c/o sob, nausea, lightheadedness. She denies cough, fevers. Pain increases when she gets upset. She reports a history of anxiety. She has tried her anxiety medications which did not improve her chest pain. She is alert and breathing easily.

## 2016-02-10 ENCOUNTER — Encounter: Payer: Self-pay | Admitting: Emergency Medicine

## 2016-02-10 DIAGNOSIS — Z79899 Other long term (current) drug therapy: Secondary | ICD-10-CM | POA: Insufficient documentation

## 2016-02-10 DIAGNOSIS — F17200 Nicotine dependence, unspecified, uncomplicated: Secondary | ICD-10-CM | POA: Insufficient documentation

## 2016-02-10 DIAGNOSIS — K5730 Diverticulosis of large intestine without perforation or abscess without bleeding: Secondary | ICD-10-CM | POA: Insufficient documentation

## 2016-02-10 DIAGNOSIS — Z791 Long term (current) use of non-steroidal anti-inflammatories (NSAID): Secondary | ICD-10-CM | POA: Insufficient documentation

## 2016-02-10 DIAGNOSIS — J45909 Unspecified asthma, uncomplicated: Secondary | ICD-10-CM | POA: Insufficient documentation

## 2016-02-10 DIAGNOSIS — K2980 Duodenitis without bleeding: Secondary | ICD-10-CM | POA: Insufficient documentation

## 2016-02-10 NOTE — ED Triage Notes (Signed)
Patient ambulatory to triage with steady gait, without difficulty or distress noted; pt reports last few days having mid epigastric pain & feels like a "knot" accomp by nausea

## 2016-02-11 ENCOUNTER — Emergency Department: Payer: Self-pay

## 2016-02-11 ENCOUNTER — Emergency Department
Admission: EM | Admit: 2016-02-11 | Discharge: 2016-02-11 | Disposition: A | Discharge status: Home / Self Care | Payer: Self-pay | Attending: Emergency Medicine | Admitting: Emergency Medicine

## 2016-02-11 DIAGNOSIS — R1011 Right upper quadrant pain: Secondary | ICD-10-CM

## 2016-02-11 DIAGNOSIS — K5730 Diverticulosis of large intestine without perforation or abscess without bleeding: Secondary | ICD-10-CM

## 2016-02-11 DIAGNOSIS — K2980 Duodenitis without bleeding: Secondary | ICD-10-CM

## 2016-02-11 LAB — COMPREHENSIVE METABOLIC PANEL
ALBUMIN: 3.9 g/dL (ref 3.5–5.0)
ALK PHOS: 63 U/L (ref 38–126)
ALT: 12 U/L — ABNORMAL LOW (ref 14–54)
AST: 13 U/L — AB (ref 15–41)
Anion gap: 9 (ref 5–15)
BUN: 17 mg/dL (ref 6–20)
CALCIUM: 9.2 mg/dL (ref 8.9–10.3)
CO2: 26 mmol/L (ref 22–32)
CREATININE: 0.98 mg/dL (ref 0.44–1.00)
Chloride: 107 mmol/L (ref 101–111)
GFR calc Af Amer: >60 mL/min (ref >60)
GLUCOSE: 104 mg/dL — AB (ref 65–99)
POTASSIUM: 3.6 mmol/L (ref 3.5–5.1)
Sodium: 142 mmol/L (ref 135–145)
TOTAL PROTEIN: 7 g/dL (ref 6.5–8.1)

## 2016-02-11 LAB — URINALYSIS COMPLETE WITH MICROSCOPIC (ARMC ONLY)
BACTERIA UA: NONE SEEN
BILIRUBIN URINE: NEGATIVE
GLUCOSE, UA: NEGATIVE mg/dL
KETONES UR: NEGATIVE mg/dL
LEUKOCYTES UA: NEGATIVE
NITRITE: NEGATIVE
Protein, ur: NEGATIVE mg/dL
SPECIFIC GRAVITY, URINE: 1.02 (ref 1.005–1.030)
pH: 6 (ref 5.0–8.0)

## 2016-02-11 LAB — CBC
HCT: 39.2 % (ref 35.0–47.0)
Hemoglobin: 13.7 g/dL (ref 12.0–16.0)
MCH: 34.1 pg — ABNORMAL HIGH (ref 26.0–34.0)
MCHC: 35 g/dL (ref 32.0–36.0)
MCV: 97.5 fL (ref 80.0–100.0)
PLATELETS: 273 10*3/uL (ref 150–440)
RBC: 4.02 MIL/uL (ref 3.80–5.20)
RDW: 12.9 % (ref 11.5–14.5)
WBC: 10.9 10*3/uL (ref 3.6–11.0)

## 2016-02-11 LAB — LIPASE, BLOOD: Lipase: 23 U/L (ref 11–51)

## 2016-02-11 LAB — POCT PREGNANCY, URINE: Preg Test, Ur: NEGATIVE

## 2016-02-11 LAB — TROPONIN I

## 2016-02-11 MED ORDER — MORPHINE SULFATE (PF) 2 MG/ML IV SOLN
2.0000 mg | Freq: Once | INTRAVENOUS | Status: DC
  Filled 2016-02-11: qty 1

## 2016-02-11 MED ORDER — ONDANSETRON HCL 4 MG/2ML IJ SOLN
4.0000 mg | Freq: Once | INTRAMUSCULAR | Status: AC
  Administered 2016-02-11: 4 mg via INTRAVENOUS
  Filled 2016-02-11: qty 2

## 2016-02-11 MED ORDER — IOPAMIDOL (ISOVUE-300) INJECTION 61%
30.0000 mL | Freq: Once | INTRAVENOUS | Status: AC | PRN
  Administered 2016-02-11: 30 mL via ORAL

## 2016-02-11 MED ORDER — GI COCKTAIL ~~LOC~~
ORAL | Status: AC
  Administered 2016-02-11: 30 mL via ORAL
  Filled 2016-02-11: qty 30

## 2016-02-11 MED ORDER — GI COCKTAIL ~~LOC~~
30.0000 mL | Freq: Once | ORAL | Status: AC
  Administered 2016-02-11: 30 mL via ORAL

## 2016-02-11 MED ORDER — PANTOPRAZOLE SODIUM 40 MG PO TBEC: 40.0000 mg | DELAYED_RELEASE_TABLET | Freq: Every day | ORAL | 1 refills | Status: DC

## 2016-02-11 MED ORDER — MORPHINE SULFATE (PF) 2 MG/ML IV SOLN
2.0000 mg | Freq: Once | INTRAVENOUS | Status: AC
  Administered 2016-02-11: 2 mg via INTRAVENOUS
  Filled 2016-02-11: qty 1

## 2016-02-11 MED ORDER — ONDANSETRON HCL 4 MG/2ML IJ SOLN: 4.0000 mg | Freq: Once | INTRAMUSCULAR | Status: DC

## 2016-02-11 MED ORDER — IOPAMIDOL (ISOVUE-300) INJECTION 61%
100.0000 mL | Freq: Once | INTRAVENOUS | Status: AC | PRN
  Administered 2016-02-11: 100 mL via INTRAVENOUS

## 2016-02-11 NOTE — ED Provider Notes (Signed)
[facility] Emergency Department Provider Note   First MD Initiated Contact with Patient 02/11/16 [phone]     (approximate)  I have reviewed the triage vital signs and the nursing notes.   HISTORY  Chief Complaint Abdominal Pain   HPI Victoria Pierce is a 35 y.o. female with history of bipolar disorder presents to the emergency department with epigastric abdominal pain and persistent nausea 3 days. Patient states that she feels a knot in the area of her discomfort. Patient denies any fever or diarrhea at this time.   Past Medical History:  Diagnosis Date  . Anxiety   . Asthma   . Bipolar 1 disorder ([place])   . GERD (gastroesophageal reflux disease)   . Ulcerative colitis   . Vaginal septum     Patient Active Problem List   Diagnosis Date Noted  . Ulcerative colitis   . GERD (gastroesophageal reflux disease)   . Vaginal septum   . Anxiety     Past Surgical History:  Procedure Laterality Date  . TUBAL LIGATION      Prior to Admission medications   Medication Sig Start Date End Date Taking? Authorizing Provider  albuterol (PROVENTIL HFA;VENTOLIN HFA) 108 (90 BASE) MCG/ACT inhaler Inhale 2 puffs into the lungs every 4 (four) hours as needed for wheezing. 08/02/11 08/01/12  Ezequiel Essex, MD  Aspirin-Salicylamide-Caffeine (BC FAST PAIN RELIEF) [phone] MG PACK Take 1-2 Packages by mouth 5 (five) times daily as needed (for pain).     Historical Provider, MD  benztropine (COGENTIN) 1 MG tablet Take 1 mg by mouth 2 (two) times daily.    Historical Provider, MD  brompheniramine-pseudoephedrine-DM 30-2-10 MG/5ML syrup Take 5 mLs by mouth 4 (four) times daily as needed. Patient not taking: Reported on 06/15/2015 01/31/15   Sable Feil, PA-C  butalbital-acetaminophen-caffeine [facility]) [phone] MG tablet Take 1-2 tablets by mouth every 6 (six) hours as needed for headache. 08/23/15 08/22/16  Harvest Dark, MD  gabapentin (NEURONTIN) 300 MG  capsule Take 300 mg by mouth 3 (three) times daily.     Historical Provider, MD  haloperidol (HALDOL) 2 MG tablet Take 2 mg by mouth 2 (two) times daily.     Historical Provider, MD  HYDROcodone-acetaminophen (NORCO) 5-325 MG per tablet Take 1 tablet by mouth every 6 (six) hours as needed. Patient not taking: Reported on 06/15/2015 11/16/13   Jeannett Senior, PA-C  hydrOXYzine (ATARAX/VISTARIL) 50 MG tablet Take 100 mg by mouth at bedtime.     Historical Provider, MD  ibuprofen (ADVIL,MOTRIN) 600 MG tablet Take 1 tablet (600 mg total) by mouth every 6 (six) hours as needed. Patient not taking: Reported on 06/15/2015 11/16/13   Jeannett Senior, PA-C  ibuprofen (ADVIL,MOTRIN) 800 MG tablet Take 1 tablet (800 mg total) by mouth every 8 (eight) hours as needed for moderate pain. Patient not taking: Reported on 06/15/2015 01/31/15   Sable Feil, PA-C  LORazepam (ATIVAN) 0.5 MG tablet Take 1 tablet (0.5 mg total) by mouth 3 (three) times daily as needed for anxiety. 09/29/15   Frederica Kuster, PA-C  magic mouthwash w/lidocaine SOLN Take 5 mLs by mouth 4 (four) times daily. 07/08/15   Sable Feil, PA-C  Magnesium Oxide 250 MG TABS Take 1 tablet by mouth 2 (two) times daily.    Historical Provider, MD  meloxicam (MOBIC) 7.5 MG tablet Take 7.5 mg by mouth 2 (two) times daily.     Historical Provider, MD  Multiple Vitamin (MULTIVITAMIN WITH MINERALS) TABS  tablet Take 1 tablet by mouth daily.    Historical Provider, MD  oxcarbazepine (TRILEPTAL) 600 MG tablet Take 600 mg by mouth 3 (three) times daily.     Historical Provider, MD  pantoprazole (PROTONIX) 40 MG tablet Take 1 tablet (40 mg total) by mouth daily. 02/11/16 02/10/17  Gregor Hams, MD  ranitidine (ZANTAC) 150 MG tablet Take 150 mg by mouth 2 (two) times daily.    Historical Provider, MD  topiramate (TOPAMAX) 25 MG tablet Take 50 mg by mouth 2 (two) times daily.     Historical Provider, MD  vitamin B-12 (CYANOCOBALAMIN) 500 MCG tablet Take  500 mcg by mouth daily.    Historical Provider, MD    Allergies No known drug allergies No family history on file.  Social History Social History  Substance Use Topics  . Smoking status: Current Every Day Smoker    Packs/day: 1.00  . Smokeless tobacco: Never Used  . Alcohol use No    Review of Systems Constitutional: No fever/chills Eyes: No visual changes. ENT: No sore throat. Cardiovascular: Denies chest pain. Respiratory: Denies shortness of breath. Gastrointestinal: Positive for abdominal pain and nausea/vomiting Genitourinary: Negative for dysuria. Musculoskeletal: Negative for back pain. Skin: Negative for rash. Neurological: Negative for headaches, focal weakness or numbness.  10-point ROS otherwise negative.  ____________________________________________   PHYSICAL EXAM:  VITAL SIGNS: ED Triage Vitals  Enc Vitals Group     BP 02/10/16 2356 (!) 142/92     Pulse Rate 02/10/16 2356 90     Resp 02/10/16 2356 18     Temp 02/10/16 2356 98.2 F (36.8 C)     Temp Source 02/10/16 2356 Oral     SpO2 02/10/16 2356 100 %     Weight 02/10/16 2357 162 lb (73.5 kg)     Height 02/10/16 2357 5' 2"  (1.575 m)     Head Circumference --      Peak Flow --      Pain Score 02/10/16 2357 8     Pain Loc --      Pain Edu? --      Excl. in [place]? --     Constitutional: Alert and oriented. Well appearing and in no acute distress. Eyes: Conjunctivae are normal. PERRL. EOMI. Head: Atraumatic. Mouth/Throat: Mucous membranes are moist.  Oropharynx non-erythematous. Cardiovascular: Normal rate, regular rhythm. Good peripheral circulation. Grossly normal heart sounds. Respiratory: Normal respiratory effort.  No retractions. Lungs CTAB. Gastrointestinal: Right upper quadrant/epigastric pain with palpation. No distention.  Musculoskeletal: No lower extremity tenderness nor edema. No gross deformities of extremities. Neurologic:  Normal speech and language. No gross focal neurologic  deficits are appreciated.  Skin:  Skin is warm, dry and intact. No rash noted. Psychiatric: Mood and affect are normal. Speech and behavior are normal.  ____________________________________________   LABS (all labs ordered are listed, but only abnormal results are displayed)  Labs Reviewed  COMPREHENSIVE METABOLIC PANEL - Abnormal; Notable for the following:       Result Value   Glucose, Bld 104 (*)    AST 13 (*)    ALT 12 (*)    Total Bilirubin <0.1 (*)    All other components within normal limits  CBC - Abnormal; Notable for the following:    MCH 34.1 (*)    All other components within normal limits  URINALYSIS COMPLETEWITH MICROSCOPIC ([facility] ONLY) - Abnormal; Notable for the following:    Color, Urine YELLOW (*)    APPearance CLEAR (*)  Hgb urine dipstick 2+ (*)    Squamous Epithelial / LPF 0-5 (*)    All other components within normal limits  LIPASE, BLOOD  TROPONIN I  POCT PREGNANCY, URINE   ____________________________________________  EKG  ED ECG REPORT I, [place] N Tres Grzywacz, the attending physician, personally viewed and interpreted this ECG.   Date: 02/11/2016  EKG Time: 12:06 AM  Rate: 83  Rhythm: Normal sinus rhythm  Axis: Normal  Intervals: Normal  ST&T Change: None  ____________________________________________  RADIOLOGY I, [place] N Thurza Kwiecinski, personally viewed and evaluated these images (plain radiographs) as part of my medical decision making, as well as reviewing the written report by the radiologist.  Dg Chest 2 View  Result Date: 02/11/2016 CLINICAL DATA:  35 year old female with chest pain EXAM: CHEST  2 VIEW COMPARISON:  Chest radiograph dated 09/29/2015 FINDINGS: The heart size and mediastinal contours are within normal limits. Both lungs are clear. The visualized skeletal structures are unremarkable. IMPRESSION: No active cardiopulmonary disease. Electronically Signed   By: Anner Crete M.D.   On: 02/11/2016 00:41   Ct Abdomen Pelvis W  Contrast  Result Date: 02/11/2016 CLINICAL DATA:  Initial evaluation for acute mid epigastric pain. Also with left lower quadrant pain. Nausea and vomiting. EXAM: CT ABDOMEN AND PELVIS WITH CONTRAST TECHNIQUE: Multidetector CT imaging of the abdomen and pelvis was performed using the standard protocol following bolus administration of intravenous contrast. CONTRAST:  153m ISOVUE-300 IOPAMIDOL (ISOVUE-300) INJECTION 61% COMPARISON:  Prior CT from 06/15/2015. FINDINGS: Lower chest: Mild subsegmental atelectasis present dependently within the visualized lung bases. Visualized lungs are otherwise clear. Hepatobiliary: Liver demonstrates a normal contrast enhanced appearance. Gallbladder within normal limits. No significant biliary dilatation. Pancreas: Pancreas within normal limits. Spleen: Spleen within normal limits. Adrenals/Urinary Tract: Adrenal glands are normal. Kidneys equal in size with symmetric enhancement. No nephrolithiasis, hydronephrosis, or focal enhancing renal mass. Ureters of normal caliber without abnormality. Partially distended bladder within normal limits. Stomach/Bowel: Stomach moderately distended but within normal limits. There is abnormal wall thickening with inflammatory stranding about the duodenum, primarily involving the second portion (series 5, image 31). Findings compatible with acute duodenitis. Distally, small bowel is of normal caliber without associated inflammatory changes. Appendix is normal. Colonic diverticulosis noted without evidence for acute diverticulitis. Vascular/Lymphatic: Normal intravascular enhancement seen throughout the intra-abdominal aorta and its branch vessels. No adenopathy. Reproductive: Uterus and ovaries within normal limits. Other: No free intraperitoneal air.  No free fluid. Musculoskeletal: No acute osseous abnormality. No worrisome lytic or blastic osseous lesions. IMPRESSION: 1. Circumferential wall thickening with inflammatory stranding about the  duodenum, compatible with acute duodenitis. 2. No other acute intra-abdominal or pelvic process identified. 3. Colonic diverticulosis without evidence for acute diverticulitis. Electronically Signed   By: BJeannine BogaM.D.   On: 02/11/2016 03:59    ____________________________________________    Procedures      INITIAL IMPRESSION / ASSESSMENT AND PLAN / ED COURSE  Pertinent labs & imaging results that were available during my care of the patient were reviewed by me and considered in my medical decision making (see chart for details).  Patient given GI cocktail and emergency Department will be prescribed Protonix for home and referred to gastroenterologist   Clinical Course    ____________________________________________  FINAL CLINICAL IMPRESSION(S) / ED DIAGNOSES  Final diagnoses:  RUQ pain  Duodenitis  Diverticulosis of large intestine without hemorrhage     MEDICATIONS GIVEN DURING THIS VISIT:  Medications  morphine 2 MG/ML injection 2 mg (not administered)  ondansetron (  ZOFRAN) injection 4 mg (4 mg Intravenous Given 02/11/16 0241)  morphine 2 MG/ML injection 2 mg (2 mg Intravenous Given 02/11/16 0254)  gi cocktail (Maalox,Lidocaine,Donnatal) (30 mLs Oral Given 02/11/16 0254)  iopamidol (ISOVUE-300) 61 % injection 30 mL (30 mLs Oral Contrast Given 02/11/16 0254)  iopamidol (ISOVUE-300) 61 % injection 100 mL (100 mLs Intravenous Contrast Given 02/11/16 0333)     NEW OUTPATIENT MEDICATIONS STARTED DURING THIS VISIT:  New Prescriptions   PANTOPRAZOLE (PROTONIX) 40 MG TABLET    Take 1 tablet (40 mg total) by mouth daily.    Modified Medications   No medications on file    Discontinued Medications   No medications on file     Note:  This document was prepared using Dragon voice recognition software and may include unintentional dictation errors.    Gregor Hams, MD 02/11/16 [phone]

## 2016-02-11 NOTE — ED Notes (Signed)
Pt discharged to home.  Family member driving.  Discharge instructions reviewed.  Verbalized understanding.  No questions or concerns at this time.  Teach back verified.  Pt in NAD.  No items left in ED.   

## 2016-04-26 ENCOUNTER — Emergency Department
Admission: EM | Admit: 2016-04-26 | Discharge: 2016-04-26 | Disposition: A | Discharge status: Home / Self Care | Payer: Self-pay | Attending: Emergency Medicine | Admitting: Emergency Medicine

## 2016-04-26 ENCOUNTER — Encounter: Payer: Self-pay | Admitting: Emergency Medicine

## 2016-04-26 DIAGNOSIS — Z5321 Procedure and treatment not carried out due to patient leaving prior to being seen by health care provider: Secondary | ICD-10-CM | POA: Insufficient documentation

## 2016-04-26 DIAGNOSIS — R1030 Lower abdominal pain, unspecified: Secondary | ICD-10-CM | POA: Insufficient documentation

## 2016-04-26 DIAGNOSIS — J45909 Unspecified asthma, uncomplicated: Secondary | ICD-10-CM | POA: Insufficient documentation

## 2016-04-26 DIAGNOSIS — F17200 Nicotine dependence, unspecified, uncomplicated: Secondary | ICD-10-CM | POA: Insufficient documentation

## 2016-04-26 DIAGNOSIS — Z7982 Long term (current) use of aspirin: Secondary | ICD-10-CM | POA: Insufficient documentation

## 2016-04-26 LAB — COMPREHENSIVE METABOLIC PANEL
ALBUMIN: 3.9 g/dL (ref 3.5–5.0)
ALT: 20 U/L (ref 14–54)
AST: 16 U/L (ref 15–41)
Alkaline Phosphatase: 67 U/L (ref 38–126)
Anion gap: 7 (ref 5–15)
BUN: 17 mg/dL (ref 6–20)
CHLORIDE: 107 mmol/L (ref 101–111)
CO2: 23 mmol/L (ref 22–32)
CREATININE: 0.77 mg/dL (ref 0.44–1.00)
Calcium: 8.9 mg/dL (ref 8.9–10.3)
GFR calc Af Amer: >60 mL/min (ref >60)
Glucose, Bld: 110 mg/dL — ABNORMAL HIGH (ref 65–99)
POTASSIUM: 3.4 mmol/L — AB (ref 3.5–5.1)
SODIUM: 137 mmol/L (ref 135–145)
Total Bilirubin: <0.1 mg/dL — ABNORMAL LOW (ref 0.3–1.2)
Total Protein: 7.1 g/dL (ref 6.5–8.1)

## 2016-04-26 LAB — CBC WITH DIFFERENTIAL/PLATELET
BASOS ABS: 0.1 10*3/uL (ref 0–0.1)
BASOS PCT: 0 %
EOS ABS: 0.2 10*3/uL (ref 0–0.7)
EOS PCT: 1 %
HCT: 36.2 % (ref 35.0–47.0)
Hemoglobin: 12.5 g/dL (ref 12.0–16.0)
LYMPHS PCT: 11 %
Lymphs Abs: 2.1 10*3/uL (ref 1.0–3.6)
MCH: 33.7 pg (ref 26.0–34.0)
MCHC: 34.6 g/dL (ref 32.0–36.0)
MCV: 97.3 fL (ref 80.0–100.0)
MONO ABS: 0.6 10*3/uL (ref 0.2–0.9)
Monocytes Relative: 3 %
Neutro Abs: 17.3 10*3/uL — ABNORMAL HIGH (ref 1.4–6.5)
Neutrophils Relative %: 85 %
PLATELETS: 293 10*3/uL (ref 150–440)
RBC: 3.72 MIL/uL — AB (ref 3.80–5.20)
RDW: 12.2 % (ref 11.5–14.5)
WBC: 20.4 10*3/uL — AB (ref 3.6–11.0)

## 2016-04-26 LAB — URINALYSIS, COMPLETE (UACMP) WITH MICROSCOPIC
BACTERIA UA: NONE SEEN
Bilirubin Urine: NEGATIVE
GLUCOSE, UA: NEGATIVE mg/dL
HGB URINE DIPSTICK: NEGATIVE
KETONES UR: NEGATIVE mg/dL
Leukocytes, UA: NEGATIVE
NITRITE: NEGATIVE
PROTEIN: NEGATIVE mg/dL
Specific Gravity, Urine: 1.012 (ref 1.005–1.030)
pH: 5 (ref 5.0–8.0)

## 2016-04-26 LAB — LIPASE, BLOOD: LIPASE: 12 U/L (ref 11–51)

## 2016-04-26 NOTE — ED Triage Notes (Signed)
Reports lower abd pain and vaginal dc. .  States she has also been having pain "in her hiatal hernia" x 2 months but still having pain despite protonix.  +nausea. Denies difficulty urinating

## 2016-04-26 NOTE — ED Notes (Signed)
No answer when called several times from lobby

## 2016-05-06 ENCOUNTER — Emergency Department
Admission: EM | Admit: 2016-05-06 | Discharge: 2016-05-06 | Disposition: A | Discharge status: Home / Self Care | Payer: Self-pay | Attending: Emergency Medicine | Admitting: Emergency Medicine

## 2016-05-06 DIAGNOSIS — R112 Nausea with vomiting, unspecified: Secondary | ICD-10-CM

## 2016-05-06 DIAGNOSIS — K2990 Gastroduodenitis, unspecified, without bleeding: Secondary | ICD-10-CM

## 2016-05-06 DIAGNOSIS — Z79899 Other long term (current) drug therapy: Secondary | ICD-10-CM | POA: Insufficient documentation

## 2016-05-06 DIAGNOSIS — F17200 Nicotine dependence, unspecified, uncomplicated: Secondary | ICD-10-CM | POA: Insufficient documentation

## 2016-05-06 DIAGNOSIS — J45909 Unspecified asthma, uncomplicated: Secondary | ICD-10-CM | POA: Insufficient documentation

## 2016-05-06 LAB — LIPASE, BLOOD: LIPASE: 24 U/L (ref 11–51)

## 2016-05-06 LAB — COMPREHENSIVE METABOLIC PANEL
ALBUMIN: 4 g/dL (ref 3.5–5.0)
ALT: 16 U/L (ref 14–54)
AST: 13 U/L — AB (ref 15–41)
Alkaline Phosphatase: 85 U/L (ref 38–126)
Anion gap: 9 (ref 5–15)
BUN: 20 mg/dL (ref 6–20)
CHLORIDE: 107 mmol/L (ref 101–111)
CO2: 24 mmol/L (ref 22–32)
Calcium: 9.1 mg/dL (ref 8.9–10.3)
Creatinine, Ser: 0.81 mg/dL (ref 0.44–1.00)
GFR calc Af Amer: >60 mL/min (ref >60)
GLUCOSE: 111 mg/dL — AB (ref 65–99)
POTASSIUM: 3.8 mmol/L (ref 3.5–5.1)
SODIUM: 140 mmol/L (ref 135–145)
Total Bilirubin: 0.2 mg/dL — ABNORMAL LOW (ref 0.3–1.2)
Total Protein: 7 g/dL (ref 6.5–8.1)

## 2016-05-06 LAB — POCT PREGNANCY, URINE: Preg Test, Ur: NEGATIVE

## 2016-05-06 LAB — URINALYSIS, COMPLETE (UACMP) WITH MICROSCOPIC
Bacteria, UA: NONE SEEN
GLUCOSE, UA: NEGATIVE mg/dL
Hgb urine dipstick: NEGATIVE
KETONES UR: 5 mg/dL — AB
LEUKOCYTES UA: NEGATIVE
Nitrite: NEGATIVE
PH: 5 (ref 5.0–8.0)
Protein, ur: 30 mg/dL — AB
Specific Gravity, Urine: >1.046 — ABNORMAL HIGH (ref 1.005–1.030)

## 2016-05-06 LAB — CBC
HCT: 38.2 % (ref 35.0–47.0)
HEMOGLOBIN: 13.4 g/dL (ref 12.0–16.0)
MCH: 33.4 pg (ref 26.0–34.0)
MCHC: 35 g/dL (ref 32.0–36.0)
MCV: 95.4 fL (ref 80.0–100.0)
PLATELETS: 432 10*3/uL (ref 150–440)
RBC: 4.01 MIL/uL (ref 3.80–5.20)
RDW: 12.7 % (ref 11.5–14.5)
WBC: 11.1 10*3/uL — ABNORMAL HIGH (ref 3.6–11.0)

## 2016-05-06 MED ORDER — PANTOPRAZOLE SODIUM 40 MG PO TBEC: 40.0000 mg | DELAYED_RELEASE_TABLET | Freq: Every day | ORAL | 1 refills | Status: DC

## 2016-05-06 MED ORDER — HYDROCODONE-ACETAMINOPHEN 5-325 MG PO TABS
2.0000 | ORAL_TABLET | Freq: Once | ORAL | Status: AC
  Administered 2016-05-06: 2 via ORAL
  Filled 2016-05-06: qty 2

## 2016-05-06 MED ORDER — GI COCKTAIL ~~LOC~~
30.0000 mL | Freq: Once | ORAL | Status: AC
  Administered 2016-05-06: 30 mL via ORAL
  Filled 2016-05-06: qty 30

## 2016-05-06 MED ORDER — GI COCKTAIL ~~LOC~~: 30.0000 mL | Freq: Once | ORAL | Status: DC

## 2016-05-06 MED ORDER — ONDANSETRON 4 MG PO TBDP: ORAL_TABLET | ORAL | 0 refills | Status: DC

## 2016-05-06 MED ORDER — HYDROCODONE-ACETAMINOPHEN 5-325 MG PO TABS: 1.0000 | ORAL_TABLET | ORAL | 0 refills | Status: DC | PRN

## 2016-05-06 MED ORDER — ONDANSETRON 4 MG PO TBDP
4.0000 mg | ORAL_TABLET | Freq: Once | ORAL | Status: AC
  Administered 2016-05-06: 4 mg via ORAL
  Filled 2016-05-06: qty 1

## 2016-05-06 MED ORDER — SUCRALFATE 1 G PO TABS: 1.0000 g | ORAL_TABLET | Freq: Four times a day (QID) | ORAL | 1 refills | Status: DC | PRN

## 2016-05-06 NOTE — ED Notes (Signed)
Patient reports having mid epigastric pain that radiates downward and now in her back.  Patient reports having the pain for a week.  Reports history of hiatal hernia and states this feels the same.  Patient reports having no PMD.

## 2016-05-06 NOTE — ED Provider Notes (Signed)
[facility] Emergency Department Provider Note  ____________________________________________   First MD Initiated Contact with Patient 05/06/16 [phone]     (approximate)  I have reviewed the triage vital signs and the nursing notes.   HISTORY  Chief Complaint Hernia    HPI Victoria Pierce is a 36 y.o. female with a history of GERD, possible hiatal hernia, duodenitis, possible UC (never verified), and bipolar disorder who presents for gradually worsening and persistent epigastric pain.  She has had similar presentations in the past.  When she was last seen in this ED she was placed on PPI and carafate and recommended to go to GI for follow up.  She has not yet done so due to not having insurance.  Recently, however, she has signed up for charity care at [facility] and anticipates getting a GI appointment soon.   She reports that the pain is severe, sharp and burning, worse with food.  +Nausea and vomiting.  Denies alcohol.  Smokes cigarettes.  Reports that she stopped taking her Protonix several weeks ago "because is wasn't working".  Denies fever/chills, CP, SOB.   Past Medical History:  Diagnosis Date  . Anxiety   . Asthma   . Bipolar 1 disorder ([place])   . GERD (gastroesophageal reflux disease)   . Ulcerative colitis   . Vaginal septum     Patient Active Problem List   Diagnosis Date Noted  . Ulcerative colitis   . GERD (gastroesophageal reflux disease)   . Vaginal septum   . Anxiety     Past Surgical History:  Procedure Laterality Date  . TUBAL LIGATION      Prior to Admission medications   Medication Sig Start Date End Date Taking? Authorizing Provider  albuterol (PROVENTIL HFA;VENTOLIN HFA) 108 (90 BASE) MCG/ACT inhaler Inhale 2 puffs into the lungs every 4 (four) hours as needed for wheezing. 08/02/11 08/01/12  Ezequiel Essex, MD  Aspirin-Salicylamide-Caffeine (BC FAST PAIN RELIEF) [phone] MG PACK Take 1-2 Packages by mouth 5 (five) times  daily as needed (for pain).     Historical Provider, MD  benztropine (COGENTIN) 1 MG tablet Take 1 mg by mouth 2 (two) times daily.    Historical Provider, MD  brompheniramine-pseudoephedrine-DM 30-2-10 MG/5ML syrup Take 5 mLs by mouth 4 (four) times daily as needed. Patient not taking: Reported on 06/15/2015 01/31/15   Sable Feil, PA-C  butalbital-acetaminophen-caffeine [facility]) [phone] MG tablet Take 1-2 tablets by mouth every 6 (six) hours as needed for headache. 08/23/15 08/22/16  Harvest Dark, MD  gabapentin (NEURONTIN) 300 MG capsule Take 300 mg by mouth 3 (three) times daily.     Historical Provider, MD  haloperidol (HALDOL) 2 MG tablet Take 2 mg by mouth 2 (two) times daily.     Historical Provider, MD  HYDROcodone-acetaminophen (NORCO/VICODIN) 5-325 MG tablet Take 1 tablet by mouth every 4 (four) hours as needed for moderate pain. 05/06/16   Hinda Kehr, MD  hydrOXYzine (ATARAX/VISTARIL) 50 MG tablet Take 100 mg by mouth at bedtime.     Historical Provider, MD  LORazepam (ATIVAN) 0.5 MG tablet Take 1 tablet (0.5 mg total) by mouth 3 (three) times daily as needed for anxiety. 09/29/15   Frederica Kuster, PA-C  magic mouthwash w/lidocaine SOLN Take 5 mLs by mouth 4 (four) times daily. 07/08/15   Sable Feil, PA-C  Magnesium Oxide 250 MG TABS Take 1 tablet by mouth 2 (two) times daily.    Historical Provider, MD  meloxicam (MOBIC) 7.5 MG  tablet Take 7.5 mg by mouth 2 (two) times daily.     Historical Provider, MD  Multiple Vitamin (MULTIVITAMIN WITH MINERALS) TABS tablet Take 1 tablet by mouth daily.    Historical Provider, MD  ondansetron (ZOFRAN ODT) 4 MG disintegrating tablet Allow 1-2 tablets to dissolve in your mouth every 8 hours as needed for nausea/vomiting 05/06/16   Hinda Kehr, MD  oxcarbazepine (TRILEPTAL) 600 MG tablet Take 600 mg by mouth 3 (three) times daily.     Historical Provider, MD  pantoprazole (PROTONIX) 40 MG tablet Take 1 tablet (40 mg total) by mouth daily.  05/06/16 05/06/17  Hinda Kehr, MD  ranitidine (ZANTAC) 150 MG tablet Take 150 mg by mouth 2 (two) times daily.    Historical Provider, MD  sucralfate (CARAFATE) 1 g tablet Take 1 tablet (1 g total) by mouth 4 (four) times daily as needed (for abdominal discomfort, nausea, and/or vomiting). 05/06/16   Hinda Kehr, MD  topiramate (TOPAMAX) 25 MG tablet Take 50 mg by mouth 2 (two) times daily.     Historical Provider, MD  vitamin B-12 (CYANOCOBALAMIN) 500 MCG tablet Take 500 mcg by mouth daily.    Historical Provider, MD    Allergies Patient has no known allergies.  No family history on file.  Social History Social History  Substance Use Topics  . Smoking status: Current Every Day Smoker    Packs/day: 1.00  . Smokeless tobacco: Never Used  . Alcohol use No    Review of Systems Constitutional: No fever/chills Eyes: No visual changes. ENT: No sore throat. Cardiovascular: Denies chest pain. Respiratory: Denies shortness of breath. Gastrointestinal: +Epigastric pain, +N/V Genitourinary: Negative for dysuria. Musculoskeletal: Negative for back pain. Skin: Negative for rash. Neurological: Negative for headaches, focal weakness or numbness.  10-point ROS otherwise negative.  ____________________________________________   PHYSICAL EXAM:  VITAL SIGNS: ED Triage Vitals [05/06/16 0051]  Enc Vitals Group     BP (!) 145/87     Pulse Rate 93     Resp 18     Temp 98.7 F (37.1 C)     Temp Source Oral     SpO2 100 %     Weight 180 lb (81.6 kg)     Height 5' 2"  (1.575 m)     Head Circumference      Peak Flow      Pain Score 10     Pain Loc      Pain Edu?      Excl. in [place]?     Constitutional: Alert and oriented. Well appearing and in no acute distress. Eyes: Conjunctivae are normal. PERRL. EOMI. Head: Atraumatic. Nose: No congestion/rhinnorhea. Mouth/Throat: Mucous membranes are moist.  Oropharynx non-erythematous. Neck: No stridor.  No meningeal signs.   Cardiovascular:  Normal rate, regular rhythm. Good peripheral circulation. Grossly normal heart sounds. Respiratory: Normal respiratory effort.  No retractions. Lungs CTAB. Gastrointestinal: Soft with mild tenderness to palpation of the epigastrium.  No distention. Musculoskeletal: No lower extremity tenderness nor edema. No gross deformities of extremities. Neurologic:  Normal speech and language. No gross focal neurologic deficits are appreciated.  Skin:  Skin is warm, dry and intact. No rash noted. Psychiatric: Mood and affect are normal. Speech and behavior are normal.  ____________________________________________   LABS (all labs ordered are listed, but only abnormal results are displayed)  Labs Reviewed  CBC - Abnormal; Notable for the following:       Result Value   WBC 11.1 (*)    All other  components within normal limits  COMPREHENSIVE METABOLIC PANEL - Abnormal; Notable for the following:    Glucose, Bld 111 (*)    AST 13 (*)    Total Bilirubin 0.2 (*)    All other components within normal limits  URINALYSIS, COMPLETE (UACMP) WITH MICROSCOPIC - Abnormal; Notable for the following:    Color, Urine YELLOW (*)    APPearance CLEAR (*)    Specific Gravity, Urine >1.046 (*)    Bilirubin Urine SMALL (*)    Ketones, ur 5 (*)    Protein, ur 30 (*)    Squamous Epithelial / LPF 0-5 (*)    All other components within normal limits  LIPASE, BLOOD  POC URINE PREG, ED  POCT PREGNANCY, URINE   ____________________________________________  EKG  None - EKG not ordered by ED physician ____________________________________________  RADIOLOGY   No results found.  ____________________________________________   PROCEDURES  Procedure(s) performed:   Procedures   Critical Care performed: No ____________________________________________   INITIAL IMPRESSION / ASSESSMENT AND PLAN / ED COURSE  Pertinent labs & imaging results that were available during my care of the patient were reviewed  by me and considered in my medical decision making (see chart for details).  The patient has a history of similar problems and has not yet followed up with GI.  Her vital signs are normal and she is afebrile.  She came to the ED 10 days ago and had lab work performed but left before evaluation.  At that time showed a leukocytosis of 20 but today is only 11.  All the rest of her labs are reassuring and within normal limits.  Exam is reassuring as well, only mild tenderness to palpation of the epigastrium but no guarding or rebound.  I explained to her that I felt that additional CT scans would not be helpful for her and would increase her future risk of cancer.  We will treat her empirically with PPIs and Carafate.  I pointed out that even though she felt like the PPI as not working, her symptoms got worse after she stopped taking it.  I will also give her some Zofran for the vomiting.  I reviewed the patient's prescription history over the last 12 months in the [place] Controlled Substances Database, and he has had no narcotics prescriptions filled and that time.  Given the amount of discomfort she is experiencing now give her a dose of Norco here and a few Norco by prescription but I explained that this would not continue and that she needs to stick with over-the-counter medication.  She will follow up with [facility] at the next available opportunity.  I gave my usual and customary return precautions.         ____________________________________________  FINAL CLINICAL IMPRESSION(S) / ED DIAGNOSES  Final diagnoses:  Gastritis and duodenitis  Nausea and vomiting, intractability of vomiting not specified, unspecified vomiting type     MEDICATIONS GIVEN DURING THIS VISIT:  Medications  HYDROcodone-acetaminophen (NORCO/VICODIN) 5-325 MG per tablet 2 tablet (not administered)  gi cocktail (Maalox,Lidocaine,Donnatal) (not administered)  ondansetron (ZOFRAN-ODT) disintegrating tablet 4 mg (4 mg Oral Given  05/06/16 0531)     NEW OUTPATIENT MEDICATIONS STARTED DURING THIS VISIT:  New Prescriptions   HYDROCODONE-ACETAMINOPHEN (NORCO/VICODIN) 5-325 MG TABLET    Take 1 tablet by mouth every 4 (four) hours as needed for moderate pain.   ONDANSETRON (ZOFRAN ODT) 4 MG DISINTEGRATING TABLET    Allow 1-2 tablets to dissolve in your mouth every 8 hours as  needed for nausea/vomiting   SUCRALFATE (CARAFATE) 1 G TABLET    Take 1 tablet (1 g total) by mouth 4 (four) times daily as needed (for abdominal discomfort, nausea, and/or vomiting).    Modified Medications   Modified Medication Previous Medication   PANTOPRAZOLE (PROTONIX) 40 MG TABLET pantoprazole (PROTONIX) 40 MG tablet      Take 1 tablet (40 mg total) by mouth daily.    Take 1 tablet (40 mg total) by mouth daily.    Discontinued Medications   HYDROCODONE-ACETAMINOPHEN (NORCO) 5-325 MG PER TABLET    Take 1 tablet by mouth every 6 (six) hours as needed.   IBUPROFEN (ADVIL,MOTRIN) 600 MG TABLET    Take 1 tablet (600 mg total) by mouth every 6 (six) hours as needed.   IBUPROFEN (ADVIL,MOTRIN) 800 MG TABLET    Take 1 tablet (800 mg total) by mouth every 8 (eight) hours as needed for moderate pain.     Note:  This document was prepared using Dragon voice recognition software and may include unintentional dictation errors.    Hinda Kehr, MD 05/06/16 [phone]

## 2016-05-06 NOTE — Discharge Instructions (Signed)
Your labs were reassuring today.  It is very important that you follow-up with a GI doctor at the next available opportunity given that you continue to have issues with gastritis and duodenitis.  Continue to avoid alcohol and try to stop smoking, both of which can irritate your GI tract as well as your lungs.  Please read through the included information and take the prescribed medications including the Protonix (or other proton pump inhibitor) and Carafate.  We recommend you discuss the appropriate timing of those medicines with the pharmacist said that they are as effective as possible.  Take Norco as prescribed for severe pain. Do not drink alcohol, drive or participate in any other potentially dangerous activities while taking this medication as it may make you sleepy. Do not take this medication with any other sedating medications, either prescription or over-the-counter. If you were prescribed Percocet or Vicodin, do not take these with acetaminophen (Tylenol) as it is already contained within these medications.   This medication is an opiate (or narcotic) pain medication and can be habit forming.  Use it as little as possible to achieve adequate pain control.  Do not use or use it with extreme caution if you have a history of opiate abuse or dependence.  If you are on a pain contract with your primary care doctor or a pain specialist, be sure to let them know you were prescribed this medication today from the [facility] Emergency Department.  This medication is intended for your use only - do not give any to anyone else and keep it in a secure place where nobody else, especially children, have access to it.  It will also cause or worsen constipation, so you may want to consider taking an over-the-counter stool softener while you are taking this medication.  Return to the emergency department if you develop new or worsening symptoms that concern you.

## 2016-05-06 NOTE — ED Triage Notes (Addendum)
Pt presents to ED via POV with c/o hiatal hernia pain. Pt reports being seen here recently for same but left "because of an emergency phone call". Pt reports (+) N/V, reports no relief with OTC medications. Pt reports "excruciating pain" with associated SHOB.

## 2016-08-27 ENCOUNTER — Emergency Department
Admission: EM | Admit: 2016-08-27 | Discharge: 2016-08-28 | Disposition: A | Discharge status: Home / Self Care | Payer: Self-pay | Attending: Emergency Medicine | Admitting: Emergency Medicine

## 2016-08-27 DIAGNOSIS — J45909 Unspecified asthma, uncomplicated: Secondary | ICD-10-CM | POA: Insufficient documentation

## 2016-08-27 DIAGNOSIS — F17200 Nicotine dependence, unspecified, uncomplicated: Secondary | ICD-10-CM | POA: Insufficient documentation

## 2016-08-27 DIAGNOSIS — R1013 Epigastric pain: Secondary | ICD-10-CM | POA: Insufficient documentation

## 2016-08-27 DIAGNOSIS — R1084 Generalized abdominal pain: Secondary | ICD-10-CM | POA: Insufficient documentation

## 2016-08-27 DIAGNOSIS — K2980 Duodenitis without bleeding: Secondary | ICD-10-CM | POA: Insufficient documentation

## 2016-08-27 DIAGNOSIS — R109 Unspecified abdominal pain: Secondary | ICD-10-CM

## 2016-08-27 LAB — URINALYSIS, COMPLETE (UACMP) WITH MICROSCOPIC
BILIRUBIN URINE: NEGATIVE
Bacteria, UA: NONE SEEN
GLUCOSE, UA: NEGATIVE mg/dL
HGB URINE DIPSTICK: NEGATIVE
KETONES UR: NEGATIVE mg/dL
LEUKOCYTES UA: NEGATIVE
Nitrite: NEGATIVE
PH: 6 (ref 5.0–8.0)
PROTEIN: NEGATIVE mg/dL
RBC / HPF: NONE SEEN RBC/hpf (ref 0–5)
Specific Gravity, Urine: 1.017 (ref 1.005–1.030)

## 2016-08-27 LAB — COMPREHENSIVE METABOLIC PANEL
ALBUMIN: 3.7 g/dL (ref 3.5–5.0)
ALT: 10 U/L — AB (ref 14–54)
AST: 13 U/L — AB (ref 15–41)
Alkaline Phosphatase: 48 U/L (ref 38–126)
Anion gap: 8 (ref 5–15)
BILIRUBIN TOTAL: 0.4 mg/dL (ref 0.3–1.2)
BUN: 14 mg/dL (ref 6–20)
CHLORIDE: 108 mmol/L (ref 101–111)
CO2: 22 mmol/L (ref 22–32)
Calcium: 8.8 mg/dL — ABNORMAL LOW (ref 8.9–10.3)
Creatinine, Ser: 0.86 mg/dL (ref 0.44–1.00)
GFR calc Af Amer: >60 mL/min (ref >60)
GFR calc non Af Amer: >60 mL/min (ref >60)
GLUCOSE: 100 mg/dL — AB (ref 65–99)
POTASSIUM: 3.4 mmol/L — AB (ref 3.5–5.1)
Sodium: 138 mmol/L (ref 135–145)
TOTAL PROTEIN: 6.8 g/dL (ref 6.5–8.1)

## 2016-08-27 LAB — POCT PREGNANCY, URINE: PREG TEST UR: NEGATIVE

## 2016-08-27 LAB — CBC
HEMATOCRIT: 39.7 % (ref 35.0–47.0)
Hemoglobin: 13.5 g/dL (ref 12.0–16.0)
MCH: 33.6 pg (ref 26.0–34.0)
MCHC: 33.9 g/dL (ref 32.0–36.0)
MCV: 99 fL (ref 80.0–100.0)
Platelets: 263 10*3/uL (ref 150–440)
RBC: 4.01 MIL/uL (ref 3.80–5.20)
RDW: 12.8 % (ref 11.5–14.5)
WBC: 14.8 10*3/uL — ABNORMAL HIGH (ref 3.6–11.0)

## 2016-08-27 LAB — TROPONIN I: Troponin I: <0.03 ng/mL (ref <0.03)

## 2016-08-27 LAB — LIPASE, BLOOD: Lipase: 17 U/L (ref 11–51)

## 2016-08-27 MED ORDER — ONDANSETRON HCL 4 MG/2ML IJ SOLN
4.0000 mg | Freq: Once | INTRAMUSCULAR | Status: AC
  Administered 2016-08-27: 4 mg via INTRAVENOUS
  Filled 2016-08-27: qty 2

## 2016-08-27 MED ORDER — MORPHINE SULFATE (PF) 4 MG/ML IV SOLN
4.0000 mg | Freq: Once | INTRAVENOUS | Status: AC
  Administered 2016-08-27: 4 mg via INTRAVENOUS
  Filled 2016-08-27: qty 1

## 2016-08-27 NOTE — ED Provider Notes (Signed)
[facility] Emergency Department Provider Note   ____________________________________________   First MD Initiated Contact with Patient 08/27/16 2307     (approximate)  I have reviewed the triage vital signs and the nursing notes.   HISTORY  Chief Complaint Abdominal Pain    HPI Victoria Pierce is a 36 y.o. female who comes into the hospital today with some abdominal pain. The patient has a history of a hiatal hernia and also in her lower intestine. She reports that she's been having a lot of pain in her chest, epigastric area and above her belly button. She reports that this pain radiates to her left shoulder. She's had this pain for a long time. She reports that she's been taking Protonix and Carafate but has not been working. The patient states that the pain has been getting worse over the last week. She's been taking bare as well as Tylenol. She is also been taking a lot of BC powders. The patient has not seen anyone for this pain because she currently doesn't have any insurance. She is attempting to apply for charity care at [facility]. The patient states that her pain is currently a 8 out of 10 in intensity. She's had some nausea and vomiting but denies any fevers. She's vomited about 3-4 times today. The patient denies any diarrhea. The patient reports that she's had some shortness of breath. She is here today for evaluation.   Past Medical History:  Diagnosis Date  . Anxiety   . Asthma   . Bipolar 1 disorder ([place])   . GERD (gastroesophageal reflux disease)   . Ulcerative colitis   . Vaginal septum     Patient Active Problem List   Diagnosis Date Noted  . Ulcerative colitis   . GERD (gastroesophageal reflux disease)   . Vaginal septum   . Anxiety     Past Surgical History:  Procedure Laterality Date  . TUBAL LIGATION      Prior to Admission medications   Medication Sig Start Date End Date Taking? Authorizing Provider  albuterol (PROVENTIL  HFA;VENTOLIN HFA) 108 (90 BASE) MCG/ACT inhaler Inhale 2 puffs into the lungs every 4 (four) hours as needed for wheezing. 08/02/11 08/01/12  Rancour, Annie Main, MD  Aspirin-Salicylamide-Caffeine (BC FAST PAIN RELIEF) [phone] MG PACK Take 1-2 Packages by mouth 5 (five) times daily as needed (for pain).     [provider]  benztropine (COGENTIN) 1 MG tablet Take 1 mg by mouth 2 (two) times daily.    [provider]  brompheniramine-pseudoephedrine-DM 30-2-10 MG/5ML syrup Take 5 mLs by mouth 4 (four) times daily as needed. Patient not taking: Reported on 06/15/2015 01/31/15   Sable Feil, PA-C  ciprofloxacin (CIPRO) 500 MG tablet Take 1 tablet (500 mg total) by mouth 2 (two) times daily. 08/28/16 09/04/16  Loney Hering, MD  gabapentin (NEURONTIN) 300 MG capsule Take 300 mg by mouth 3 (three) times daily.     [provider]  haloperidol (HALDOL) 2 MG tablet Take 2 mg by mouth 2 (two) times daily.     [provider]  HYDROcodone-acetaminophen (NORCO/VICODIN) 5-325 MG tablet Take 1 tablet by mouth every 4 (four) hours as needed for moderate pain. 05/06/16   Hinda Kehr, MD  hydrOXYzine (ATARAX/VISTARIL) 50 MG tablet Take 100 mg by mouth at bedtime.     [provider]  LORazepam (ATIVAN) 0.5 MG tablet Take 1 tablet (0.5 mg total) by mouth 3 (three) times daily as needed for anxiety.  09/29/15   Frederica Kuster, PA-C  magic mouthwash w/lidocaine SOLN Take 5 mLs by mouth 4 (four) times daily. 07/08/15   Sable Feil, PA-C  Magnesium Oxide 250 MG TABS Take 1 tablet by mouth 2 (two) times daily.    [provider]  meloxicam (MOBIC) 7.5 MG tablet Take 7.5 mg by mouth 2 (two) times daily.     [provider]  metroNIDAZOLE (FLAGYL) 500 MG tablet Take 1 tablet (500 mg total) by mouth 2 (two) times daily. 08/28/16 09/04/16  Loney Hering, MD  Multiple Vitamin (MULTIVITAMIN WITH MINERALS) TABS tablet Take 1 tablet by mouth daily.     [provider]  ondansetron (ZOFRAN ODT) 4 MG disintegrating tablet Allow 1-2 tablets to dissolve in your mouth every 8 hours as needed for nausea/vomiting 05/06/16   Hinda Kehr, MD  oxcarbazepine (TRILEPTAL) 600 MG tablet Take 600 mg by mouth 3 (three) times daily.     [provider]  pantoprazole (PROTONIX) 40 MG tablet Take 1 tablet (40 mg total) by mouth daily. 05/06/16 05/06/17  Hinda Kehr, MD  ranitidine (ZANTAC) 150 MG tablet Take 150 mg by mouth 2 (two) times daily.    [provider]  sucralfate (CARAFATE) 1 g tablet Take 1 tablet (1 g total) by mouth 4 (four) times daily as needed (for abdominal discomfort, nausea, and/or vomiting). 05/06/16   Hinda Kehr, MD  topiramate (TOPAMAX) 25 MG tablet Take 50 mg by mouth 2 (two) times daily.     [provider]  traMADol (ULTRAM) 50 MG tablet Take 1 tablet (50 mg total) by mouth every 6 (six) hours as needed. 08/28/16   Loney Hering, MD  vitamin B-12 (CYANOCOBALAMIN) 500 MCG tablet Take 500 mcg by mouth daily.    [provider]    Allergies Patient has no known allergies.  No family history on file.  Social History Social History  Substance Use Topics  . Smoking status: Current Every Day Smoker    Packs/day: 1.00  . Smokeless tobacco: Never Used  . Alcohol use No    Review of Systems Constitutional: No fever/chills Eyes: No visual changes. ENT: No sore throat. Cardiovascular: chest pain. Respiratory:  shortness of breath. Gastrointestinal:  abdominal pain, nausea, vomiting.  No diarrhea.  No constipation. Genitourinary: Negative for dysuria. Musculoskeletal: Negative for back pain. Skin: Negative for rash. Neurological: Negative for headaches, focal weakness or numbness.   ____________________________________________   PHYSICAL EXAM:  VITAL SIGNS: ED Triage Vitals [08/27/16 2043]  Enc Vitals Group     BP 105/70     Pulse Rate 94     Resp 18     Temp 98.7 F  (37.1 C)     Temp Source Oral     SpO2 100 %     Weight 168 lb (76.2 kg)     Height 5' 2"  (1.575 m)     Head Circumference      Peak Flow      Pain Score 9     Pain Loc      Pain Edu?      Excl. in [place]?     Constitutional: Alert and oriented. Well appearing and in moderate distress. Eyes: Conjunctivae are normal. PERRL. EOMI. Head: Atraumatic. Nose: No congestion/rhinnorhea. Mouth/Throat: Mucous membranes are moist.  Oropharynx non-erythematous. Cardiovascular: Normal rate, regular rhythm. Grossly normal heart sounds.  Good peripheral circulation. Respiratory: Normal respiratory effort.  No retractions. Lungs CTAB. Gastrointestinal: Soft with upper abd pain to  palpation. No distention. Positive bowel sounds Musculoskeletal: No lower extremity tenderness nor edema.   Neurologic:  Normal speech and language.  Skin:  Skin is warm, dry and intact.  Psychiatric: Mood and affect are normal.   ____________________________________________   LABS (all labs ordered are listed, but only abnormal results are displayed)  Labs Reviewed  COMPREHENSIVE METABOLIC PANEL - Abnormal; Notable for the following:       Result Value   Potassium 3.4 (*)    Glucose, Bld 100 (*)    Calcium 8.8 (*)    AST 13 (*)    ALT 10 (*)    All other components within normal limits  CBC - Abnormal; Notable for the following:    WBC 14.8 (*)    All other components within normal limits  URINALYSIS, COMPLETE (UACMP) WITH MICROSCOPIC - Abnormal; Notable for the following:    Color, Urine YELLOW (*)    APPearance CLEAR (*)    Squamous Epithelial / LPF 0-5 (*)    All other components within normal limits  LIPASE, BLOOD  TROPONIN I  TROPONIN I  POCT PREGNANCY, URINE   ____________________________________________  EKG  ED ECG REPORT I, Loney Hering, the attending physician, personally viewed and interpreted this ECG.   Date: 08/27/2016  EKG Time: 2057  Rate: 88  Rhythm: normal sinus rhythm   Axis: normal  Intervals:none  ST&T Change: none  ____________________________________________  RADIOLOGY  [place] abd RUQ ____________________________________________   PROCEDURES  Procedure(s) performed: None  Procedures  Critical Care performed: No  ____________________________________________   INITIAL IMPRESSION / ASSESSMENT AND PLAN / ED COURSE  Pertinent labs & imaging results that were available during my care of the patient were reviewed by me and considered in my medical decision making (see chart for details).  This is a 36 year old female who comes into the hospital today with some epigastric pain and upper abdominal pain. The patient has had this pain for some time but reports it has been worse in the past week. I will give the patient a dose of morphine and Zofran and I will send the patient for an ultrasound for further evaluation of her pain. The patient's lipase is negative and her troponin is negative.  Clinical Course as of Aug 28 353  Mon Aug 28, 2016  5456 Normal right upper quadrant ultrasound. US Abdomen Limited RUQ [AW]  2563 1. Increased colonic stool burden consistent with constipation. 2. Stable mild transmural thickening noted of the duodenal bulb and second portion of duodenum. Chronic mild duodenitis is not excluded. 3. Stable 8 x 4 mm hypodensity the right hepatic dome possibly a cyst, hemangioma or a diaphragmatic muscular slip.   CT Abdomen Pelvis W Contrast [AW]    Clinical Course User Index [AW] Loney Hering, MD    The patient's CT shows some duodenitis and some constipation. She will be discharged home to follow-up with the acute care clinic. ____________________________________________   FINAL CLINICAL IMPRESSION(S) / ED DIAGNOSES  Final diagnoses:  Abdominal pain  Epigastric pain  Duodenitis      NEW MEDICATIONS STARTED DURING THIS VISIT:  New Prescriptions   CIPROFLOXACIN (CIPRO) 500 MG TABLET    Take 1 tablet (500  mg total) by mouth 2 (two) times daily.   METRONIDAZOLE (FLAGYL) 500 MG TABLET    Take 1 tablet (500 mg total) by mouth 2 (two) times daily.   TRAMADOL (ULTRAM) 50 MG TABLET    Take 1 tablet (50 mg total) by mouth  every 6 (six) hours as needed.     Note:  This document was prepared using Dragon voice recognition software and may include unintentional dictation errors.    Loney Hering, MD 08/28/16 [phone]

## 2016-08-27 NOTE — ED Triage Notes (Signed)
Pt states that for the past week she has been having abd pain, states that she has a hx of a hiatal hernia and an ulcer in her lower intestines, pt states that her pain is at her epigastric area and radiates into her left breast, pt reports vomiting

## 2016-08-27 NOTE — ED Notes (Signed)
ED Provider at bedside. 

## 2016-08-28 ENCOUNTER — Emergency Department: Payer: Self-pay

## 2016-08-28 ENCOUNTER — Encounter: Payer: Self-pay | Admitting: Radiology

## 2016-08-28 LAB — TROPONIN I: Troponin I: <0.03 ng/mL (ref <0.03)

## 2016-08-28 MED ORDER — CIPROFLOXACIN HCL 500 MG PO TABS
500.0000 mg | ORAL_TABLET | Freq: Once | ORAL | Status: AC
  Administered 2016-08-28: 500 mg via ORAL
  Filled 2016-08-28: qty 1

## 2016-08-28 MED ORDER — MORPHINE SULFATE (PF) 4 MG/ML IV SOLN
4.0000 mg | Freq: Once | INTRAVENOUS | Status: AC
  Administered 2016-08-28: 4 mg via INTRAVENOUS

## 2016-08-28 MED ORDER — GI COCKTAIL ~~LOC~~
30.0000 mL | Freq: Once | ORAL | Status: AC
  Administered 2016-08-28: 30 mL via ORAL
  Filled 2016-08-28: qty 30

## 2016-08-28 MED ORDER — IOPAMIDOL (ISOVUE-300) INJECTION 61%
100.0000 mL | Freq: Once | INTRAVENOUS | Status: AC | PRN
  Administered 2016-08-28: 100 mL via INTRAVENOUS

## 2016-08-28 MED ORDER — MORPHINE SULFATE (PF) 4 MG/ML IV SOLN
INTRAVENOUS | Status: AC
  Administered 2016-08-28: 4 mg via INTRAVENOUS
  Filled 2016-08-28: qty 1

## 2016-08-28 MED ORDER — TRAMADOL HCL 50 MG PO TABS: 50.0000 mg | ORAL_TABLET | Freq: Four times a day (QID) | ORAL | 0 refills | Status: DC | PRN

## 2016-08-28 MED ORDER — IOPAMIDOL (ISOVUE-300) INJECTION 61%
30.0000 mL | Freq: Once | INTRAVENOUS | Status: AC
  Administered 2016-08-28: 30 mL via ORAL

## 2016-08-28 MED ORDER — METRONIDAZOLE IN NACL 5-0.79 MG/ML-% IV SOLN
500.0000 mg | Freq: Once | INTRAVENOUS | Status: AC
  Administered 2016-08-28: 500 mg via INTRAVENOUS
  Filled 2016-08-28: qty 100

## 2016-08-28 MED ORDER — METRONIDAZOLE 500 MG PO TABS: 500.0000 mg | ORAL_TABLET | Freq: Two times a day (BID) | ORAL | 0 refills | Status: AC

## 2016-08-28 MED ORDER — TRAMADOL HCL 50 MG PO TABS
50.0000 mg | ORAL_TABLET | Freq: Once | ORAL | Status: AC
  Administered 2016-08-28: 50 mg via ORAL
  Filled 2016-08-28: qty 1

## 2016-08-28 MED ORDER — CIPROFLOXACIN HCL 500 MG PO TABS: 500.0000 mg | ORAL_TABLET | Freq: Two times a day (BID) | ORAL | 0 refills | Status: AC

## 2016-08-28 NOTE — ED Notes (Signed)
Ct notified that patient has finished drinking her contrast.

## 2016-08-28 NOTE — ED Notes (Signed)
Patient returned from CT

## 2016-08-28 NOTE — ED Notes (Signed)
Patient transported back from Ultrasound

## 2016-08-28 NOTE — Discharge Instructions (Signed)
Please follow up with your PCP or with the [facility]

## 2016-11-23 ENCOUNTER — Emergency Department: Payer: Self-pay

## 2016-11-23 ENCOUNTER — Encounter: Payer: Self-pay | Admitting: Emergency Medicine

## 2016-11-23 ENCOUNTER — Emergency Department
Admission: EM | Admit: 2016-11-23 | Discharge: 2016-11-23 | Disposition: A | Discharge status: Home / Self Care | Payer: Self-pay | Attending: Emergency Medicine | Admitting: Emergency Medicine

## 2016-11-23 DIAGNOSIS — Z791 Long term (current) use of non-steroidal anti-inflammatories (NSAID): Secondary | ICD-10-CM | POA: Insufficient documentation

## 2016-11-23 DIAGNOSIS — K219 Gastro-esophageal reflux disease without esophagitis: Secondary | ICD-10-CM | POA: Insufficient documentation

## 2016-11-23 DIAGNOSIS — F17200 Nicotine dependence, unspecified, uncomplicated: Secondary | ICD-10-CM | POA: Insufficient documentation

## 2016-11-23 DIAGNOSIS — R1013 Epigastric pain: Secondary | ICD-10-CM | POA: Insufficient documentation

## 2016-11-23 DIAGNOSIS — Z79899 Other long term (current) drug therapy: Secondary | ICD-10-CM | POA: Insufficient documentation

## 2016-11-23 DIAGNOSIS — J45909 Unspecified asthma, uncomplicated: Secondary | ICD-10-CM | POA: Insufficient documentation

## 2016-11-23 LAB — BASIC METABOLIC PANEL
ANION GAP: 11 (ref 5–15)
BUN: 13 mg/dL (ref 6–20)
CO2: 22 mmol/L (ref 22–32)
Calcium: 9.6 mg/dL (ref 8.9–10.3)
Chloride: 107 mmol/L (ref 101–111)
Creatinine, Ser: 0.9 mg/dL (ref 0.44–1.00)
Glucose, Bld: 104 mg/dL — ABNORMAL HIGH (ref 65–99)
POTASSIUM: 3.5 mmol/L (ref 3.5–5.1)
SODIUM: 140 mmol/L (ref 135–145)

## 2016-11-23 LAB — HEPATIC FUNCTION PANEL
ALT: 11 U/L — AB (ref 14–54)
AST: 14 U/L — ABNORMAL LOW (ref 15–41)
Albumin: 4 g/dL (ref 3.5–5.0)
Alkaline Phosphatase: 59 U/L (ref 38–126)
Bilirubin, Direct: <0.1 mg/dL — ABNORMAL LOW (ref 0.1–0.5)
Total Bilirubin: 0.7 mg/dL (ref 0.3–1.2)
Total Protein: 7.2 g/dL (ref 6.5–8.1)

## 2016-11-23 LAB — TROPONIN I: Troponin I: <0.03 ng/mL (ref <0.03)

## 2016-11-23 LAB — CBC
HEMATOCRIT: 42.3 % (ref 35.0–47.0)
HEMOGLOBIN: 14.5 g/dL (ref 12.0–16.0)
MCH: 33.9 pg (ref 26.0–34.0)
MCHC: 34.3 g/dL (ref 32.0–36.0)
MCV: 99 fL (ref 80.0–100.0)
Platelets: 329 10*3/uL (ref 150–440)
RBC: 4.27 MIL/uL (ref 3.80–5.20)
RDW: 12.7 % (ref 11.5–14.5)
WBC: 11.5 10*3/uL — ABNORMAL HIGH (ref 3.6–11.0)

## 2016-11-23 LAB — LIPASE, BLOOD: LIPASE: 44 U/L (ref 11–51)

## 2016-11-23 MED ORDER — METOCLOPRAMIDE HCL 10 MG PO TABS
10.0000 mg | ORAL_TABLET | Freq: Once | ORAL | Status: AC
  Administered 2016-11-23: 10 mg via ORAL
  Filled 2016-11-23: qty 1

## 2016-11-23 MED ORDER — METOCLOPRAMIDE HCL 10 MG PO TABS: 10.0000 mg | ORAL_TABLET | Freq: Four times a day (QID) | ORAL | 0 refills | Status: DC | PRN

## 2016-11-23 MED ORDER — GI COCKTAIL ~~LOC~~
30.0000 mL | ORAL | Status: AC
  Administered 2016-11-23: 30 mL via ORAL
  Filled 2016-11-23: qty 30

## 2016-11-23 MED ORDER — ONDANSETRON 4 MG PO TBDP
8.0000 mg | ORAL_TABLET | Freq: Once | ORAL | Status: AC
  Administered 2016-11-23: 8 mg via ORAL
  Filled 2016-11-23: qty 2

## 2016-11-23 MED ORDER — FAMOTIDINE 20 MG PO TABS
40.0000 mg | ORAL_TABLET | Freq: Once | ORAL | Status: AC
  Administered 2016-11-23: 40 mg via ORAL
  Filled 2016-11-23: qty 2

## 2016-11-23 MED ORDER — CALCIUM CARBONATE ANTACID 500 MG PO CHEW: 2.0000 | CHEWABLE_TABLET | Freq: Three times a day (TID) | ORAL | 0 refills | Status: AC | PRN

## 2016-11-23 NOTE — ED Triage Notes (Signed)
Patient ambulatory to triage with steady gait, without difficulty or distress noted; pt reports midsternal CP last few weeks, worse since last night accomp by N/V, weakness; st hx hiatal hernia

## 2016-11-23 NOTE — Discharge Instructions (Signed)
Results for orders placed or performed during the hospital encounter of 40/08/67  Basic metabolic panel  Result Value Ref Range   Sodium 140 135 - 145 mmol/L   Potassium 3.5 3.5 - 5.1 mmol/L   Chloride 107 101 - 111 mmol/L   CO2 22 22 - 32 mmol/L   Glucose, Bld 104 (H) 65 - 99 mg/dL   BUN 13 6 - 20 mg/dL   Creatinine, Ser 0.90 0.44 - 1.00 mg/dL   Calcium 9.6 8.9 - 10.3 mg/dL   GFR calc non Af Amer >60 >60 mL/min   GFR calc Af Amer >60 >60 mL/min   Anion gap 11 5 - 15  CBC  Result Value Ref Range   WBC 11.5 (H) 3.6 - 11.0 K/uL   RBC 4.27 3.80 - 5.20 MIL/uL   Hemoglobin 14.5 12.0 - 16.0 g/dL   HCT 42.3 35.0 - 47.0 %   MCV 99.0 80.0 - 100.0 fL   MCH 33.9 26.0 - 34.0 pg   MCHC 34.3 32.0 - 36.0 g/dL   RDW 12.7 11.5 - 14.5 %   Platelets 329 150 - 440 K/uL  Troponin I  Result Value Ref Range   Troponin I <0.03 <0.03 ng/mL  Lipase, blood  Result Value Ref Range   Lipase 44 11 - 51 U/L  Hepatic function panel  Result Value Ref Range   Total Protein 7.2 6.5 - 8.1 g/dL   Albumin 4.0 3.5 - 5.0 g/dL   AST 14 (L) 15 - 41 U/L   ALT 11 (L) 14 - 54 U/L   Alkaline Phosphatase 59 38 - 126 U/L   Total Bilirubin 0.7 0.3 - 1.2 mg/dL   Bilirubin, Direct <0.1 (L) 0.1 - 0.5 mg/dL   Indirect Bilirubin NOT CALCULATED 0.3 - 0.9 mg/dL   Dg Chest 2 View  Result Date: 11/23/2016 CLINICAL DATA:  Mid sternal chest pain. EXAM: CHEST  2 VIEW COMPARISON:  02/11/2016 FINDINGS: The heart size and mediastinal contours are within normal limits. Mild atelectasis at the right lung base. There is no evidence of pulmonary edema, consolidation, pneumothorax, nodule or pleural fluid. The visualized skeletal structures are unremarkable. IMPRESSION: No active cardiopulmonary disease. Electronically Signed   By: Aletta Edouard M.D.   On: 11/23/2016 20:25

## 2016-11-23 NOTE — ED Notes (Signed)

## 2016-11-23 NOTE — ED Notes (Signed)
MD at bedside. 

## 2016-11-23 NOTE — ED Provider Notes (Signed)
[facility] Emergency Department Provider Note  ____________________________________________  Time seen: Approximately 10:49 PM  I have reviewed the triage vital signs and the nursing notes.   HISTORY  Chief Complaint Chest Pain    HPI Victoria Pierce is a 36 y.o. female who complains of epigastric pain for the past few weeks, seems to be worsened today. Worse with movement, associated nausea vomiting. He relates it to a history of hiatal hernia and GERD. Reports she takes Protonix daily and has been compliant with this. Nonradiating, no aggravating or alleviating factors. Reports it is been constant for the past 2 days.     Past Medical History:  Diagnosis Date  . Anxiety   . Asthma   . Bipolar 1 disorder ([place])   . GERD (gastroesophageal reflux disease)   . Ulcerative colitis   . Vaginal septum      Patient Active Problem List   Diagnosis Date Noted  . Ulcerative colitis   . GERD (gastroesophageal reflux disease)   . Vaginal septum   . Anxiety      Past Surgical History:  Procedure Laterality Date  . TUBAL LIGATION       Prior to Admission medications   Medication Sig Start Date End Date Taking? Authorizing Provider  albuterol (PROVENTIL HFA;VENTOLIN HFA) 108 (90 BASE) MCG/ACT inhaler Inhale 2 puffs into the lungs every 4 (four) hours as needed for wheezing. 08/02/11 08/01/12  Rancour, Annie Main, MD  Aspirin-Salicylamide-Caffeine (BC FAST PAIN RELIEF) [phone] MG PACK Take 1-2 Packages by mouth 5 (five) times daily as needed (for pain).     [provider]  benztropine (COGENTIN) 1 MG tablet Take 1 mg by mouth 2 (two) times daily.    [provider]  brompheniramine-pseudoephedrine-DM 30-2-10 MG/5ML syrup Take 5 mLs by mouth 4 (four) times daily as needed. Patient not taking: Reported on 06/15/2015 01/31/15   Sable Feil, PA-C  calcium carbonate (TUMS) 500 MG chewable tablet Chew 2 tablets (400 mg of elemental  calcium total) by mouth 3 (three) times daily as needed for indigestion or heartburn. 11/23/16 11/23/17  Carrie Mew, MD  gabapentin (NEURONTIN) 300 MG capsule Take 300 mg by mouth 3 (three) times daily.     [provider]  haloperidol (HALDOL) 2 MG tablet Take 2 mg by mouth 2 (two) times daily.     [provider]  HYDROcodone-acetaminophen (NORCO/VICODIN) 5-325 MG tablet Take 1 tablet by mouth every 4 (four) hours as needed for moderate pain. 05/06/16   Hinda Kehr, MD  hydrOXYzine (ATARAX/VISTARIL) 50 MG tablet Take 100 mg by mouth at bedtime.     [provider]  LORazepam (ATIVAN) 0.5 MG tablet Take 1 tablet (0.5 mg total) by mouth 3 (three) times daily as needed for anxiety. 09/29/15   Frederica Kuster, PA-C  magic mouthwash w/lidocaine SOLN Take 5 mLs by mouth 4 (four) times daily. 07/08/15   Sable Feil, PA-C  Magnesium Oxide 250 MG TABS Take 1 tablet by mouth 2 (two) times daily.    [provider]  meloxicam (MOBIC) 7.5 MG tablet Take 7.5 mg by mouth 2 (two) times daily.     [provider]  metoCLOPramide (REGLAN) 10 MG tablet Take 1 tablet (10 mg total) by mouth every 6 (six) hours as needed. 11/23/16   Carrie Mew, MD  Multiple Vitamin (MULTIVITAMIN WITH MINERALS) TABS tablet Take 1 tablet by mouth daily.    [provider]  ondansetron (ZOFRAN ODT) 4 MG disintegrating  tablet Allow 1-2 tablets to dissolve in your mouth every 8 hours as needed for nausea/vomiting 05/06/16   Hinda Kehr, MD  oxcarbazepine (TRILEPTAL) 600 MG tablet Take 600 mg by mouth 3 (three) times daily.     [provider]  pantoprazole (PROTONIX) 40 MG tablet Take 1 tablet (40 mg total) by mouth daily. 05/06/16 05/06/17  Hinda Kehr, MD  ranitidine (ZANTAC) 150 MG tablet Take 150 mg by mouth 2 (two) times daily.    [provider]  sucralfate (CARAFATE) 1 g tablet Take 1 tablet (1 g total) by mouth 4 (four) times daily as needed (for  abdominal discomfort, nausea, and/or vomiting). 05/06/16   Hinda Kehr, MD  topiramate (TOPAMAX) 25 MG tablet Take 50 mg by mouth 2 (two) times daily.     [provider]  traMADol (ULTRAM) 50 MG tablet Take 1 tablet (50 mg total) by mouth every 6 (six) hours as needed. 08/28/16   Loney Hering, MD  vitamin B-12 (CYANOCOBALAMIN) 500 MCG tablet Take 500 mcg by mouth daily.    [provider]     Allergies Patient has no known allergies.   No family history on file.  Social History Social History  Substance Use Topics  . Smoking status: Current Every Day Smoker    Packs/day: 1.00  . Smokeless tobacco: Never Used  . Alcohol use No    Review of Systems  Constitutional:   No fever or chills.  ENT:   No sore throat. No rhinorrhea. Cardiovascular:   No chest pain or syncope. Respiratory:   No dyspnea or cough. Gastrointestinal:   Positive as above for abdominal pain with vomiting. No diarrhea or constipation.  Musculoskeletal:   Negative for focal pain or swelling All other systems reviewed and are negative except as documented above in ROS and HPI.  ____________________________________________   PHYSICAL EXAM:  VITAL SIGNS: ED Triage Vitals [11/23/16 2005]  Enc Vitals Group     BP 124/82     Pulse Rate 94     Resp 18     Temp 98.6 F (37 C)     Temp Source Oral     SpO2 95 %     Weight 164 lb (74.4 kg)     Height 5' 2"  (1.575 m)     Head Circumference      Peak Flow      Pain Score 9     Pain Loc      Pain Edu?      Excl. in [place]?     Vital signs reviewed, nursing assessments reviewed.   Constitutional:   Alert and oriented. Well appearing and in no distress. Eyes:   No scleral icterus.  EOMI. No nystagmus. No conjunctival pallor. PERRL. ENT   Head:   Normocephalic and atraumatic.   Nose:   No congestion/rhinnorhea.    Mouth/Throat:   MMM, no pharyngeal erythema. No peritonsillar mass.    Neck:   No meningismus. Full  ROM Hematological/Lymphatic/Immunilogical:   No cervical lymphadenopathy. Cardiovascular:   RRR. Symmetric bilateral radial and DP pulses.  No murmurs.  Respiratory:   Normal respiratory effort without tachypnea/retractions. Breath sounds are clear and equal bilaterally. No wheezes/rales/rhonchi. Gastrointestinal:   Soft With mild left upper quadrant tenderness. Non distended. There is no CVA tenderness.  No rebound, rigidity, or guarding. Genitourinary:   deferred Musculoskeletal:   Normal range of motion in all extremities. No joint effusions.  No lower extremity tenderness.  No edema. Neurologic:  Normal speech and language.  Motor grossly intact. No gross focal neurologic deficits are appreciated.  Skin:    Skin is warm, dry and intact. No rash noted.  No petechiae, purpura, or bullae.  ____________________________________________    LABS (pertinent positives/negatives) (all labs ordered are listed, but only abnormal results are displayed) Labs Reviewed  BASIC METABOLIC PANEL - Abnormal; Notable for the following:       Result Value   Glucose, Bld 104 (*)    All other components within normal limits  CBC - Abnormal; Notable for the following:    WBC 11.5 (*)    All other components within normal limits  HEPATIC FUNCTION PANEL - Abnormal; Notable for the following:    AST 14 (*)    ALT 11 (*)    Bilirubin, Direct <0.1 (*)    All other components within normal limits  TROPONIN I  LIPASE, BLOOD   ____________________________________________   EKG  Interpreted by me Sinus rhythm rate of 95, normal axis intervals QRS ST segments and T waves  ____________________________________________    RADIOLOGY  Dg Chest 2 View  Result Date: 11/23/2016 CLINICAL DATA:  Mid sternal chest pain. EXAM: CHEST  2 VIEW COMPARISON:  02/11/2016 FINDINGS: The heart size and mediastinal contours are within normal limits. Mild atelectasis at the right lung base. There is no evidence of  pulmonary edema, consolidation, pneumothorax, nodule or pleural fluid. The visualized skeletal structures are unremarkable. IMPRESSION: No active cardiopulmonary disease. Electronically Signed   By: Aletta Edouard M.D.   On: 11/23/2016 20:25    ____________________________________________   PROCEDURES Procedures  ____________________________________________   INITIAL IMPRESSION / ASSESSMENT AND PLAN / ED COURSE  Pertinent labs & imaging results that were available during my care of the patient were reviewed by me and considered in my medical decision making (see chart for details).  Patient well appearing no acute distress, normal vital signs presents with epigastric pain consistent with chronic GERD, not currently controlled by her daily protonic switch she reports compliance with. Exam is overall benign and reassuring. Low suspicion for acute chest or abdominal pathology such as ACS PE dissection AAA perforation obstruction torsion cholecystitis or pancreatitis. Labs are normal. Chest x-rays unremarkable. I'll treat patient with antacids and have her follow up with primary care.      ____________________________________________   FINAL CLINICAL IMPRESSION(S) / ED DIAGNOSES  Final diagnoses:  Epigastric pain  Gastroesophageal reflux disease, esophagitis presence not specified      New Prescriptions   CALCIUM CARBONATE (TUMS) 500 MG CHEWABLE TABLET    Chew 2 tablets (400 mg of elemental calcium total) by mouth 3 (three) times daily as needed for indigestion or heartburn.   METOCLOPRAMIDE (REGLAN) 10 MG TABLET    Take 1 tablet (10 mg total) by mouth every 6 (six) hours as needed.     Portions of this note were generated with dragon dictation software. Dictation errors may occur despite best attempts at proofreading.    Carrie Mew, MD 11/23/16 2252

## 2016-11-23 NOTE — ED Notes (Signed)
Lab called about add-on for hepatic function panel and lipase

## 2017-01-03 ENCOUNTER — Emergency Department (HOSPITAL_COMMUNITY)
Admission: EM | Admit: 2017-01-03 | Discharge: 2017-01-03 | Disposition: A | Discharge status: Home / Self Care | Payer: Self-pay | Attending: Emergency Medicine | Admitting: Emergency Medicine

## 2017-01-03 ENCOUNTER — Encounter (HOSPITAL_COMMUNITY): Payer: Self-pay | Admitting: *Deleted

## 2017-01-03 DIAGNOSIS — Z5321 Procedure and treatment not carried out due to patient leaving prior to being seen by health care provider: Secondary | ICD-10-CM | POA: Insufficient documentation

## 2017-01-03 DIAGNOSIS — R42 Dizziness and giddiness: Secondary | ICD-10-CM | POA: Insufficient documentation

## 2017-01-03 LAB — CBC
HEMATOCRIT: 39.5 % (ref 36.0–46.0)
HEMOGLOBIN: 13 g/dL (ref 12.0–15.0)
MCH: 32.8 pg (ref 26.0–34.0)
MCHC: 32.9 g/dL (ref 30.0–36.0)
MCV: 99.7 fL (ref 78.0–100.0)
Platelets: 270 10*3/uL (ref 150–400)
RBC: 3.96 MIL/uL (ref 3.87–5.11)
RDW: 11.8 % (ref 11.5–15.5)
WBC: 10.9 10*3/uL — AB (ref 4.0–10.5)

## 2017-01-03 LAB — I-STAT BETA HCG BLOOD, ED (MC, WL, AP ONLY): I-stat hCG, quantitative: <5 m[IU]/mL (ref <5)

## 2017-01-03 LAB — BASIC METABOLIC PANEL
ANION GAP: 13 (ref 5–15)
BUN: 9 mg/dL (ref 6–20)
CALCIUM: 9.3 mg/dL (ref 8.9–10.3)
CO2: 20 mmol/L — ABNORMAL LOW (ref 22–32)
Chloride: 102 mmol/L (ref 101–111)
Creatinine, Ser: 0.82 mg/dL (ref 0.44–1.00)
Glucose, Bld: 89 mg/dL (ref 65–99)
POTASSIUM: 3.6 mmol/L (ref 3.5–5.1)
SODIUM: 135 mmol/L (ref 135–145)

## 2017-01-03 LAB — CBG MONITORING, ED: GLUCOSE-CAPILLARY: 90 mg/dL (ref 65–99)

## 2017-01-03 NOTE — ED Notes (Signed)
Called for vitals x4 and to be roomed, pt did not answer.

## 2017-01-03 NOTE — ED Triage Notes (Signed)
Pt and family member reports pt having dizziness, fatigue, syncopal episodes for several days, started after starting new medications. Pt is very lethargic at triage but will answer questions with verbal stimuli. States she is having chest pains but that is ongoing for extended amount of time.

## 2017-03-02 ENCOUNTER — Emergency Department
Admission: EM | Admit: 2017-03-02 | Discharge: 2017-03-02 | Disposition: A | Discharge status: Home / Self Care | Payer: Self-pay | Attending: Emergency Medicine | Admitting: Emergency Medicine

## 2017-03-02 ENCOUNTER — Encounter: Payer: Self-pay | Admitting: Emergency Medicine

## 2017-03-02 DIAGNOSIS — R112 Nausea with vomiting, unspecified: Secondary | ICD-10-CM | POA: Insufficient documentation

## 2017-03-02 DIAGNOSIS — R0602 Shortness of breath: Secondary | ICD-10-CM | POA: Insufficient documentation

## 2017-03-02 DIAGNOSIS — Z5321 Procedure and treatment not carried out due to patient leaving prior to being seen by health care provider: Secondary | ICD-10-CM | POA: Insufficient documentation

## 2017-03-02 DIAGNOSIS — R109 Unspecified abdominal pain: Secondary | ICD-10-CM | POA: Insufficient documentation

## 2017-03-02 LAB — COMPREHENSIVE METABOLIC PANEL WITH GFR
ALT: 12 U/L — ABNORMAL LOW (ref 14–54)
AST: 12 U/L — ABNORMAL LOW (ref 15–41)
Albumin: 4.1 g/dL (ref 3.5–5.0)
Alkaline Phosphatase: 73 U/L (ref 38–126)
Anion gap: 14 (ref 5–15)
BUN: 16 mg/dL (ref 6–20)
CO2: 21 mmol/L — ABNORMAL LOW (ref 22–32)
Calcium: 9.7 mg/dL (ref 8.9–10.3)
Chloride: 101 mmol/L (ref 101–111)
Creatinine, Ser: 0.64 mg/dL (ref 0.44–1.00)
GFR calc Af Amer: >60 mL/min
GFR calc non Af Amer: >60 mL/min
Glucose, Bld: 107 mg/dL — ABNORMAL HIGH (ref 65–99)
Potassium: 3.6 mmol/L (ref 3.5–5.1)
Sodium: 136 mmol/L (ref 135–145)
Total Bilirubin: 0.9 mg/dL (ref 0.3–1.2)
Total Protein: 7.3 g/dL (ref 6.5–8.1)

## 2017-03-02 LAB — CBC
HEMATOCRIT: 40.2 % (ref 35.0–47.0)
Hemoglobin: 13.5 g/dL (ref 12.0–16.0)
MCH: 32.8 pg (ref 26.0–34.0)
MCHC: 33.7 g/dL (ref 32.0–36.0)
MCV: 97.4 fL (ref 80.0–100.0)
PLATELETS: 410 10*3/uL (ref 150–440)
RBC: 4.13 MIL/uL (ref 3.80–5.20)
RDW: 12.9 % (ref 11.5–14.5)
WBC: 19.3 10*3/uL — ABNORMAL HIGH (ref 3.6–11.0)

## 2017-03-02 LAB — LIPASE, BLOOD: LIPASE: 17 U/L (ref 11–51)

## 2017-03-02 MED ORDER — ONDANSETRON 4 MG PO TBDP
4.0000 mg | ORAL_TABLET | Freq: Once | ORAL | Status: AC
  Administered 2017-03-02: 4 mg via ORAL
  Filled 2017-03-02: qty 1

## 2017-03-02 NOTE — ED Notes (Signed)
Patient called x2 to be roomed.  No answer by patient.  Lobby checked and patient no longer in facility.

## 2017-03-02 NOTE — ED Triage Notes (Signed)
Pt comes into the ED via POV c/o her hiatal hernia acting up.  Patient states she has had abdominal pain x4 days with nausea and vomiting.  Patient denies any diarrhea, chest pain, or dizziness.  Patient in NAD at this time and has even and unlabored respirations and was ambulatory to triage at this time.  Patient states she does feel short of breath.

## 2017-03-02 NOTE — ED Notes (Signed)
Pt left at 1254 according to pt's significant other

## 2017-03-02 NOTE — ED Notes (Signed)
Pt's significant other to registration desk stating that they need to leave to pick up her medication by 1230, that they just wanted to let us know in case we called her name, he was informed that we couldn't hold her place in line, he stated that he would try to go and get meds without her

## 2017-03-05 ENCOUNTER — Telehealth: Payer: Self-pay | Admitting: Emergency Medicine

## 2017-03-05 NOTE — Telephone Encounter (Signed)
Called patient due to lwot to inquire about condition and follow up plans. First call person answered and hung up when I identified myself. Left message asking her to call me on second call.

## 2018-01-21 ENCOUNTER — Ambulatory Visit
Admission: RE | Admit: 2018-01-21 | Discharge: 2018-01-21 | Disposition: A | Discharge status: Home / Self Care | Payer: Medicaid Other | Source: Ambulatory Visit | Attending: Family Medicine | Admitting: Family Medicine

## 2018-01-21 ENCOUNTER — Other Ambulatory Visit: Payer: Self-pay | Admitting: Family Medicine

## 2018-01-21 DIAGNOSIS — R52 Pain, unspecified: Secondary | ICD-10-CM

## 2018-01-21 DIAGNOSIS — M542 Cervicalgia: Secondary | ICD-10-CM | POA: Insufficient documentation

## 2018-01-30 ENCOUNTER — Encounter: Payer: Self-pay | Admitting: *Deleted

## 2018-02-11 ENCOUNTER — Ambulatory Visit: Payer: Medicaid Other | Attending: Family Medicine

## 2018-02-11 ENCOUNTER — Other Ambulatory Visit: Payer: Self-pay

## 2018-02-11 ENCOUNTER — Encounter: Payer: Self-pay | Admitting: Physical Therapy

## 2018-02-11 DIAGNOSIS — M62838 Other muscle spasm: Secondary | ICD-10-CM | POA: Insufficient documentation

## 2018-02-11 DIAGNOSIS — M542 Cervicalgia: Secondary | ICD-10-CM | POA: Insufficient documentation

## 2018-02-11 NOTE — Therapy (Signed)
[facility] [address] [place], [address] Phone: [phone]   Fax:  [phone]  Physical Therapy Evaluation  Patient Details  Name: NALY SCHWANZ MRN: [id number] Date of Birth: October 27, 1980 Referring Provider (PT): Tomasa Hose   Encounter Date: 02/11/2018  PT End of Session - 02/11/18 1042    Visit Number  1    Number of Visits  12    Date for PT Re-Evaluation  03/25/18    Authorization Type  medicaid    PT Start Time  1018    PT Stop Time  1114    PT Time Calculation (min)  56 min    Activity Tolerance  Patient tolerated treatment well    Behavior During Therapy  [facility] for tasks assessed/performed       Past Medical History:  Diagnosis Date  . Anxiety   . Asthma   . Bipolar 1 disorder ([place])   . GERD (gastroesophageal reflux disease)   . Ulcerative colitis   . Vaginal septum     Past Surgical History:  Procedure Laterality Date  . TUBAL LIGATION      There were no vitals filed for this visit.   Subjective Assessment - 02/11/18 1034    Subjective  Patient reports that    Pertinent History  Patient is 37 yo female that complains of chronic neck pain that started a couple of years ago. States her pain is getting worse overtime.  States it feels like she has "bubbles" and neck tension. Complains of difficulty moving her neck, L and R pain. Reports that it feels like it always needs to "pop". Difficulty with posture, Driving, bending over, difficulty sleeping (going to sleep). Patient reports that she often cracks her neck/mid back, and that her boyfriend helps, ~4x a day. Has seen a chiropractor in the past. PMH includes GERD, bipolar, depression/axniety, ulcerative colitis, smoker.    Limitations  House hold activities;Reading;Other (comment)   cervical motion   How long can you sit comfortably?  NA    How long can you stand comfortably?  NA    How long can you walk comfortably?  NA    Diagnostic tests  xray    Patient Stated Goals  pain relief    Currently in Pain?  Yes    Pain Score  2    has taken medication today. best: 2 worst: 7   Pain Orientation  Mid;Right;Left    Pain Descriptors / Indicators  Tightness;Other (Comment)   headache, tension   Pain Type  Chronic pain    Pain Radiating Towards  head    Pain Onset  More than a month ago    Pain Frequency  Constant    Aggravating Factors   cervical motion, bending over    Pain Relieving Factors  medication, rest, stretches    Effect of Pain on Daily Activities  impedes daily activities         [facility] PT Assessment - 02/11/18 0001      Assessment   Medical Diagnosis  Neck pain    Referring Provider (PT)  Ngwe Aycock    Hand Dominance  Right    Prior Therapy  no      Precautions   Precautions  None      Restrictions   Weight Bearing Restrictions  No      Balance Screen   Has the patient fallen in the past 6 months  No  How many times?  --   but patient reports that she has equilibrium issues   Has the patient had a decrease in activity level because of a fear of falling?   --   not working   Is the patient reluctant to leave their home because of a fear of falling?   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, production   Leisure  --   does drive     Cognition   Overall Cognitive Status  Within Functional Limits for tasks assessed      Observation/Other Assessments   Focus  on Therapeutic Outcomes (FOTO)   58      Sensation   Light Touch  Appears Intact      Posture/Postural Control   Posture Comments  foward head, rounded shoulders, slouched      ROM / Strength   AROM / PROM / Strength  AROM;Strength      AROM   Overall AROM   Deficits    Overall AROM Comments  b/l shoulder WFLs    AROM Assessment Site  Cervical;Shoulder    Right/Left Shoulder  --    Cervical Flexion  40    Cervical Extension  65    Cervical - Right Side Bend  30    Cervical - Left Side Bend  40    Cervical - Right Rotation  55    Cervical - Left Rotation  60      Strength   Strength Assessment Site  Shoulder;Cervical    Right/Left Shoulder  Right;Left    Right Shoulder Flexion  4/5    Right Shoulder ABduction  4/5    Right Shoulder Internal Rotation  4/5    Right Shoulder External Rotation  4/5    Left Shoulder Flexion  4-/5    Left Shoulder ABduction  4-/5    Left Shoulder Internal Rotation  4-/5    Left Shoulder External Rotation  4-/5    Cervical Flexion  4/5    Cervical Extension  4/5    Cervical - Right Side Bend  4/5    Cervical - Left Side Bend  4/5    Cervical - Right Rotation  4/5    Cervical - Left Rotation  4/5      Palpation   Palpation comment  TTP of bilateral UT, levator scap, middle trap, rhomboids, cervical paraspinals      Special Tests   Other special tests  -spurlings, -hawkins kennedy, + distraction        Objective measurements completed on examination: See above findings.    Treatment:  Therapeutic Exercises: Scapular Retraction with Resistance - 15 reps - 1 sets - 3 hold - 1x daily - 7x weekly  Seated Gentle Upper Trapezius Stretch - 3 reps - 1 sets -  30 hold - 1x daily - 7x weekly  Gentle Levator Scapulae Stretch - 10 reps - 3 sets - 30 hold - 1x daily - 7x weekly  Correct Seated Posture - 10 reps - 2 sets - 10 hold - 1x daily - 7x weekly   Patient response to treatment: Patient able to perform exercises with verbal/tactile cues with  good carryover with repetition. No complaints at end of session.    PT Education - 02/11/18 1328    Education Details  therapeutic exercise, POC, PT role    Person(s) Educated  Patient    Methods  Explanation;Demonstration;Verbal cues;Handout    Comprehension  Verbalized understanding;Returned demonstration;Verbal cues required;Tactile cues required         PT Short Term Goals - 02/11/18 1705      PT SHORT TERM GOAL #1   Title  Patient will be adherent to initial HEP at least 3x a week in prep for self management of condition.    Baseline  Initial HEP administered today    Time  6    Period  Weeks    Status  New    Target Date  03/25/18        PT Long Term Goals - 02/11/18 1706      PT LONG TERM GOAL #1   Title  The patient will demonstrate at least 10 point improvement in FOTO score indicating an increased ease of performing functional tasks.    Baseline  02/11/2018: 58    Time  6    Period  Weeks    Status  New    Target Date  03/25/18      PT LONG TERM GOAL #2   Title  Patient will be independent in home exercise program to improve strength/mobility for better functional independence with ADLs.    Baseline  initial HEP adminstered today    Time  6    Period  Weeks    Status  New    Target Date  03/25/18      PT LONG TERM GOAL #3   Title   Patient will be able to perform household work/chores with pain of 3/10 or less  to improve patient's tolerance to functional tasks.    Baseline  pain ranges from 210-7/10    Time  6    Period  Weeks    Status  New    Target Date  03/25/18      PT LONG TERM GOAL #4   Title  Patient will increase BUE gross strength to 4+/5 as to improve functional strength for increased ADL/IADL ability.    Baseline  see objective measures for current MMT grades    Time  6    Period  Weeks    Status  New    Target Date  03/25/18             Plan - 02/11/18 1701    Clinical Impression Statement  Patient is 37 yo female that  presents with chronic neck pain per patient report that is worsening over time. Patient has not had physical therapy in the past for this condition, does report previously seeing a chiropractor years ago. Upon assessment patient demonstrates deficits and pain with cervical motion and strength testing, impaired posture as well as impaired soft tissue intregity. These deficits and pain limit the patient's ability to perform functional tasks such as sleeping, turning her head, lifting, and performing ADLs. The patient would benefit from further skilled PT intervention  to address these limitations to maximize functional abilitie, pain management, and improve quality of life.     History and Personal Factors relevant to plan of care:  anxiety/drepression, bipolar, chronic neck, mid thoracic and back pain, frequent spine manipulator    Clinical Presentation  Evolving    Clinical Presentation due to:  Patient reports symptoms are worsening over time    Clinical Decision Making  Moderate    Rehab Potential  Good    Clinical Impairments Affecting Rehab Potential  + motivation, decreased functional abilities, decreased sleep, decreased activity tolerance    PT Frequency  2x / week    PT Duration  6 weeks    PT Treatment/Interventions  ADLs/Self Care Home Management;Ultrasound;Neuromuscular re-education;Spinal Manipulations;Joint Manipulations;Passive range of motion;Patient/family education;Gait training;Canalith Repostioning;Cryotherapy;Dry needling;Energy conservation;Functional mobility training;Electrical Stimulation;Therapeutic activities;Iontophoresis 53m/ml Dexamethasone;Splinting;Taping;Vestibular;Manual techniques;Therapeutic exercise;Moist Heat;Traction    PT Next Visit Plan  STM, traction, postural strengthening, assess spine mobility    PT Home Exercise Plan  see patient instruction section    Consulted and Agree with Plan of Care  Patient       Patient will benefit from skilled therapeutic  intervention in order to improve the following deficits and impairments:  Decreased endurance, Hypomobility, Decreased activity tolerance, Decreased strength, Increased fascial restricitons, Pain, Increased muscle spasms, Decreased mobility, Decreased range of motion, Postural dysfunction, Improper body mechanics  Visit Diagnosis: Cervicalgia  Other muscle spasm     Problem List Patient Active Problem List   Diagnosis Date Noted  . Ulcerative colitis   . GERD (gastroesophageal reflux disease)   . Vaginal septum   . Anxiety    DLieutenant DiegoPT, DPT 5:10 PM,02/11/18 [facility] [address] [place] [address]Phone: [phone]  Fax:  [phone] Name: KPANSY OSTROVSKYMRN: [id number] of Birth: [birth year]

## 2018-02-11 NOTE — Patient Instructions (Signed)
Access Code: [email or web address]  URL: [email or web address]  Date: 02/11/2018  Prepared by: Lieutenant Diego   Exercises  Scapular Retraction with Resistance - 15 reps - 1 sets - 3 hold - 1x daily - 7x weekly  Seated Gentle Upper Trapezius Stretch - 3 reps - 1 sets - 30 hold - 1x daily - 7x weekly  Gentle Levator Scapulae Stretch - 10 reps - 3 sets - 30 hold - 1x daily - 7x weekly  Correct Seated Posture - 10 reps - 2 sets - 10 hold - 1x daily - 7x weekly

## 2018-02-18 ENCOUNTER — Encounter: Payer: Self-pay | Admitting: Physical Therapy

## 2018-02-18 ENCOUNTER — Ambulatory Visit: Payer: Medicaid Other | Attending: Family Medicine | Admitting: Physical Therapy

## 2018-02-18 DIAGNOSIS — M542 Cervicalgia: Secondary | ICD-10-CM | POA: Diagnosis not present

## 2018-02-18 NOTE — Therapy (Signed)
[facility] [address], [place], [address] Phone: [phone]   Fax:  [phone]  Physical Therapy Treatment  Patient Details  Name: Victoria Pierce MRN: [id number] Date of Birth: [birth year] Referring Provider (PT): Tomasa Hose   Encounter Date: 02/18/2018  PT End of Session - 02/18/18 0951    Visit Number  2    Number of Visits  12    Date for PT Re-Evaluation  03/25/18    Authorization Type  medicaid    PT Start Time  0945    PT Stop Time  1030    PT Time Calculation (min)  45 min    Activity Tolerance  Patient tolerated treatment well    Behavior During Therapy  [facility] for tasks assessed/performed       Past Medical History:  Diagnosis Date  . Anxiety   . Asthma   . Bipolar 1 disorder ([place])   . GERD (gastroesophageal reflux disease)   . Ulcerative colitis   . Vaginal septum     Past Surgical History:  Procedure Laterality Date  . TUBAL LIGATION      There were no vitals filed for this visit.  Subjective Assessment - 02/18/18 0948    Subjective  Patient reports she was "a little stiff" following evaluation. Patient reports her HEP is "loosening her tension a little while she's doing them, but that she has increased tension following". Patient reports 5/10 pain today. Patient reports she falls asleep on her couch on the L side often which increases her pain.     Pertinent History  Patient is 37 yo female that complains of chronic neck pain that started a couple of years ago. States her pain is getting worse overtime.  States it feels like she has "bubbles" and neck tension. Complains of difficulty moving her neck, L and R pain. Reports that it feels like it always needs to "pop". Difficulty with posture, Driving, bending over, difficulty sleeping (going to sleep). Patient reports that she often cracks her neck/mid back, and that her boyfriend helps, ~4x a day. Has seen a chiropractor in the  past. PMH includes GERD, bipolar, depression/axniety, ulcerative colitis, smoker.    Limitations  House hold activities;Reading;Other (comment)    How long can you sit comfortably?  NA    How long can you stand comfortably?  NA    How long can you walk comfortably?  NA    Diagnostic tests  xray    Patient Stated Goals  pain relief    Pain Onset  More than a month ago           Manual - STM with trigger point release to bilat UT/levator. Following: Dry Needling (3/2) 27m .30 needles placed along the L UT/R UT to decrease increased muscular spasms and trigger points with the patient positioned in supine. Patient was educated on risks and benefits of therapy and verbally consents to PT. Prior patient demonstrates 50% R rotation, and following 100% rotation bilat - Cervical traction 10sec traction/10sec relax x10  - C0-C2 UPA grade I-II 30sec bouts 4 bouts each side for pain modulation; inc to grade III 6 bouts each side to inc rotation ROM  Ther-Ex - Supine pec stretch with towel roll 281m - HEP review of UT and levator stretch with addition of opposite HEP IR and ipsilateral UE overpressure- demonstrated by pt with accuracy -Review of tband rows with min cuing for scapular retraction without shoulder hiking;  inc to green tband intensity for HEP   ESTIM + heat pack HiVolt ESTIM 10 min at patient tolerated 55V increased to 60V through treatment at L UT area; 35V increased to 40V at R UT. Attempted to decrease muscular tension in this area. With PT assessing patient tolerance throughout (increasing intensity as needed), monitoring skin integrity (normal), with decreased pain noted from patient following, and inc ROM following.                       PT Education - 02/18/18 0951    Education Details  HEP review, DN education, ESTIM education    Person(s) Educated  Patient    Methods  Explanation;Demonstration;Verbal cues    Comprehension  Verbalized  understanding;Returned demonstration;Verbal cues required       PT Short Term Goals - 02/11/18 1705      PT SHORT TERM GOAL #1   Title  Patient will be adherent to initial HEP at least 3x a week in prep for self management of condition.    Baseline  Initial HEP administered today    Time  6    Period  Weeks    Status  New    Target Date  03/25/18        PT Long Term Goals - 02/11/18 1706      PT LONG TERM GOAL #1   Title  The patient will demonstrate at least 10 point improvement in FOTO score indicating an increased ease of performing functional tasks.    Baseline  02/11/2018: 58    Time  6    Period  Weeks    Status  New    Target Date  03/25/18      PT LONG TERM GOAL #2   Title  Patient will be independent in home exercise program to improve strength/mobility for better functional independence with ADLs.    Baseline  initial HEP adminstered today    Time  6    Period  Weeks    Status  New    Target Date  03/25/18      PT LONG TERM GOAL #3   Title   Patient will be able to perform household work/chores with pain of 3/10 or less  to improve patient's tolerance to functional tasks.    Baseline  pain ranges from 210-7/10    Time  6    Period  Weeks    Status  New    Target Date  03/25/18      PT LONG TERM GOAL #4   Title  Patient will increase BUE gross strength to 4+/5 as to improve functional strength for increased ADL/IADL ability.    Baseline  see objective measures for current MMT grades    Time  6    Period  Weeks    Status  New    Target Date  03/25/18            Plan - 02/18/18 1256    Clinical Impression Statement  Patient reports immediate pain relief from TDN with full cervical rotation following, which she is pleased with. Following manual + modality techniques, PT led patient through therex for carry over, which patient is able to complete with accuracy with min cuing from PT.     Rehab Potential  Good    Clinical Impairments Affecting Rehab  Potential  + motivation, decreased functional abilities, decreased sleep, decreased activity tolerance    PT Frequency  2x / week  PT Duration  6 weeks    PT Treatment/Interventions  ADLs/Self Care Home Management;Ultrasound;Neuromuscular re-education;Spinal Manipulations;Joint Manipulations;Passive range of motion;Patient/family education;Gait training;Canalith Repostioning;Cryotherapy;Dry needling;Energy conservation;Functional mobility training;Electrical Stimulation;Therapeutic activities;Iontophoresis 9m/ml Dexamethasone;Splinting;Taping;Vestibular;Manual techniques;Therapeutic exercise;Moist Heat;Traction    PT Next Visit Plan  STM, traction, postural strengthening, assess spine mobility    PT Home Exercise Plan  see patient instruction section    Consulted and Agree with Plan of Care  Patient       Patient will benefit from skilled therapeutic intervention in order to improve the following deficits and impairments:  Decreased endurance, Hypomobility, Decreased activity tolerance, Decreased strength, Increased fascial restricitons, Pain, Increased muscle spasms, Decreased mobility, Decreased range of motion, Postural dysfunction, Improper body mechanics  Visit Diagnosis: Cervicalgia     Problem List Patient Active Problem List   Diagnosis Date Noted  . Ulcerative colitis   . GERD (gastroesophageal reflux disease)   . Vaginal septum   . Anxiety    CShelton SilvasPT, DPT CShelton Silvas11/07/2017, 12:57 PM  [facility] [address] [place] [address]Phone: [phone]  Fax:  [phone] Name: KROSELL KHOURIMRN: [id number] of Birth: 12/28/1980-07-06

## 2018-02-21 ENCOUNTER — Encounter: Payer: Medicaid Other | Admitting: Physical Therapy

## 2018-02-26 ENCOUNTER — Ambulatory Visit: Payer: Medicaid Other | Admitting: Physical Therapy

## 2018-02-26 ENCOUNTER — Encounter: Payer: Self-pay | Admitting: Physical Therapy

## 2018-02-26 DIAGNOSIS — M542 Cervicalgia: Secondary | ICD-10-CM

## 2018-02-26 NOTE — Therapy (Signed)
[facility] [address], [place], [address] Phone: [phone]   Fax:  [phone]  Physical Therapy Treatment  Patient Details  Name: Victoria Pierce MRN: [id number] Date of Birth: September 17, 1980 Referring Provider (PT): Tomasa Hose   Encounter Date: 02/26/2018  PT End of Session - 02/26/18 1140    Visit Number  3    Number of Visits  12    Date for PT Re-Evaluation  03/25/18    Authorization Type  medicaid    PT Start Time  1115    PT Stop Time  1200    PT Time Calculation (min)  45 min    Activity Tolerance  Patient tolerated treatment well    Behavior During Therapy  [facility] for tasks assessed/performed       Past Medical History:  Diagnosis Date  . Anxiety   . Asthma   . Bipolar 1 disorder ([place])   . GERD (gastroesophageal reflux disease)   . Ulcerative colitis   . Vaginal septum     Past Surgical History:  Procedure Laterality Date  . TUBAL LIGATION      There were no vitals filed for this visit.  Subjective Assessment - 02/26/18 1118    Subjective  Patient reports she feels much better following TDN to UT last session. Patient reports she is able to notice more posterior cervical pain "along both sides of the spine" which is a 7/10 pain when she looks down or rotates her head. Patient reports compliance with her HEP.     Pertinent History  Patient is 37 yo female that complains of chronic neck pain that started a couple of years ago. States her pain is getting worse overtime.  States it feels like she has "bubbles" and neck tension. Complains of difficulty moving her neck, L and R pain. Reports that it feels like it always needs to "pop". Difficulty with posture, Driving, bending over, difficulty sleeping (going to sleep). Patient reports that she often cracks her neck/mid back, and that her boyfriend helps, ~4x a day. Has seen a chiropractor in the past. PMH includes GERD, bipolar,  depression/axniety, ulcerative colitis, smoker.    Limitations  House hold activities;Reading;Other (comment)    How long can you sit comfortably?  NA    How long can you stand comfortably?  NA    How long can you walk comfortably?  NA    Diagnostic tests  xray    Patient Stated Goals  pain relief    Pain Onset  More than a month ago       Manual - STM with trigger point release to bilat UT, levator, and cervical paraspinals. Following: Dry Needling (2/2) 93m .30 needles placed along the bilat cervical paraspinals/multifidi (inf/medial direction), and L levator (with plantar grasp) to decrease increased muscular spasms and trigger points with the patient positioned in supine. Patient was educated on risks and benefits of therapy and verbally consents to PT.  - Cervical traction 10sec traction/10sec relax x10  - C0-C2 UPA grade I-II 30sec bouts 4 bouts each side for pain modulation; inc to grade III 6 bouts each side to inc rotation ROM  Therapeutic Activity - Cervical retractions x10 with demo and TC initially with good carry over following - Seated rows green tband x10 with min cuing initially to decrease shoulder hiking with good carry over following - Doorway pec stretch x45sec hold - Postural training in sitting with patient "putting together" the  above therex. Discussion on length tension relationship and importance of proper posture in restoring this and for carry over of therex. Continued conversation about proper driving posture from ESTIM with patient demonstration  ESTIM+ heat packHiVolt ESTIM10 min at patient tolerated55Vincreased to75V increased to 80V through treatmentat bilat levator and cspine parapsinals (around C5) Attempted d/t success from prior treatment. With PT assessing patient tolerance throughout (increasing intensity as needed), monitoring skin integrity (normal), with decreased pain noted from patient following, and inc ROM following. Discsussed driving  posture with posture during this time (patient reports she drives with LUE only, slumped in seat. PT encouraged bilat UE use with proper posture)                         PT Education - 02/26/18 1140    Education Details  Continued TDN education, postural education    Person(s) Educated  Patient    Methods  Explanation;Demonstration;Verbal cues    Comprehension  Verbalized understanding;Returned demonstration;Verbal cues required       PT Short Term Goals - 02/11/18 1705      PT SHORT TERM GOAL #1   Title  Patient will be adherent to initial HEP at least 3x a week in prep for self management of condition.    Baseline  Initial HEP administered today    Time  6    Period  Weeks    Status  New    Target Date  03/25/18        PT Long Term Goals - 02/11/18 1706      PT LONG TERM GOAL #1   Title  The patient will demonstrate at least 10 point improvement in FOTO score indicating an increased ease of performing functional tasks.    Baseline  02/11/2018: 58    Time  6    Period  Weeks    Status  New    Target Date  03/25/18      PT LONG TERM GOAL #2   Title  Patient will be independent in home exercise program to improve strength/mobility for better functional independence with ADLs.    Baseline  initial HEP adminstered today    Time  6    Period  Weeks    Status  New    Target Date  03/25/18      PT LONG TERM GOAL #3   Title   Patient will be able to perform household work/chores with pain of 3/10 or less  to improve patient's tolerance to functional tasks.    Baseline  pain ranges from 210-7/10    Time  6    Period  Weeks    Status  New    Target Date  03/25/18      PT LONG TERM GOAL #4   Title  Patient will increase BUE gross strength to 4+/5 as to improve functional strength for increased ADL/IADL ability.    Baseline  see objective measures for current MMT grades    Time  6    Period  Weeks    Status  New    Target Date  03/25/18             Plan - 02/26/18 1307    Clinical Impression Statement  Patient reports good pain relief from manual + modality treatment today as well with increased cervical motion following. PT led patient through postural carry over exercises with postural training, which patient is abl eto demonstrate and verbalize  understanding of to carry over at home    Rehab Potential  Good    Clinical Impairments Affecting Rehab Potential  + motivation, decreased functional abilities, decreased sleep, decreased activity tolerance    PT Frequency  2x / week    PT Duration  6 weeks    PT Treatment/Interventions  ADLs/Self Care Home Management;Ultrasound;Neuromuscular re-education;Spinal Manipulations;Joint Manipulations;Passive range of motion;Patient/family education;Gait training;Canalith Repostioning;Cryotherapy;Dry needling;Energy conservation;Functional mobility training;Electrical Stimulation;Therapeutic activities;Iontophoresis 49m/ml Dexamethasone;Splinting;Taping;Vestibular;Manual techniques;Therapeutic exercise;Moist Heat;Traction    PT Next Visit Plan  STM, traction, postural strengthening, assess spine mobility    PT Home Exercise Plan  see patient instruction section    Consulted and Agree with Plan of Care  Patient       Patient will benefit from skilled therapeutic intervention in order to improve the following deficits and impairments:  Decreased endurance, Hypomobility, Decreased activity tolerance, Decreased strength, Increased fascial restricitons, Pain, Increased muscle spasms, Decreased mobility, Decreased range of motion, Postural dysfunction, Improper body mechanics  Visit Diagnosis: Cervicalgia     Problem List Patient Active Problem List   Diagnosis Date Noted  . Ulcerative colitis   . GERD (gastroesophageal reflux disease)   . Vaginal septum   . Anxiety    CShelton SilvasPT, DPT  CShelton Silvas11/03/2018, 1:21 PM  [facility] [address] [place] [address]Phone: [phone]  Fax:  [phone] Name: Victoria GLAESERMRN: [id number] of Birth: 4November 29, 1982

## 2018-03-01 ENCOUNTER — Ambulatory Visit (INDEPENDENT_AMBULATORY_CARE_PROVIDER_SITE_OTHER): Payer: Medicaid Other | Admitting: Gastroenterology

## 2018-03-01 ENCOUNTER — Other Ambulatory Visit: Payer: Self-pay

## 2018-03-01 ENCOUNTER — Encounter: Payer: Self-pay | Admitting: Gastroenterology

## 2018-03-01 VITALS — BP 121/86 | HR 102 | Resp 18 | Wt 197.6 lb

## 2018-03-01 DIAGNOSIS — K625 Hemorrhage of anus and rectum: Secondary | ICD-10-CM

## 2018-03-01 DIAGNOSIS — R0789 Other chest pain: Secondary | ICD-10-CM

## 2018-03-01 DIAGNOSIS — K219 Gastro-esophageal reflux disease without esophagitis: Secondary | ICD-10-CM

## 2018-03-01 DIAGNOSIS — R12 Heartburn: Secondary | ICD-10-CM

## 2018-03-01 MED ORDER — DICYCLOMINE HCL 10 MG PO CAPS: 10.0000 mg | ORAL_CAPSULE | Freq: Three times a day (TID) | ORAL | 0 refills | Status: DC

## 2018-03-01 NOTE — Progress Notes (Signed)
Cephas Darby, MD [address]  [place]  [place], [place] [address]  Main: [phone]  Fax: [phone]    Gastroenterology Consultation  Referring Provider:     [name], MD Primary Care Physician:  [name], MD Primary Gastroenterologist:  Dr. Cephas Darby Reason for Consultation:     Rectal bleeding, lower abdominal cramps, atypical chest pain        HPI:   Victoria Pierce is a 37 y.o. female referred by Dr. [name], MD  for consultation & management of chronic lower abdominal pain, rectal bleeding and atypical chest pain Patient reports that she has been experiencing chronic left lower quadrant pain associated with bloating, intermittent rectal bleeding.  She has been experiencing atypical chest pain and occasional heartburn, globus sensation.  This has been going on for several years.  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  Recently cut down the consumption about 2 months ago.  Due to these symptoms, in 2010 she underwent EGD and colonoscopy at [facility], was told she has GERD and UC.  However, is not on any medications for ulcerative colitis She had ER visits for lower abdominal pain.  Underwent CT and was found to have diverticulosis. Atypical chest pain: She is now taking protonix 40 mg once a day which provides some relief.  She does experience intermittent heartburn but chest pain is more frequent compared to heartburn.  She does have globus sensation as well.  She tried Zantac and omeprazole which didn't help  Had rectal bleeding, treated empirically for diverticulitis about 2-3 months ago.  Patient reports that her stools are soft, goes up to 2 times daily Patient reports that she has been going through stress, situational stress, family stress, she has history of bipolar currently being treated for it.  NSAIDs: Long history of goody powder 10-15 packets/ay for last  10-14years, cut down to 1 a day since 11/2017  Antiplts/Anticoagulants/Anti thrombotics: none  GI Procedures: EGD and colonoscopy in 2010 at [facility] Reports not available She denies family history of GI malignancy  Past Medical History:  Diagnosis Date  . Anxiety   . Asthma   . Bipolar 1 disorder ([place])   . GERD (gastroesophageal reflux disease)   . Ulcerative colitis   . Vaginal septum     Past Surgical History:  Procedure Laterality Date  . TUBAL LIGATION      Current Outpatient Medications:  .  Aspirin-Salicylamide-Caffeine (BC FAST PAIN RELIEF) 650-195-33.3 MG PACK, Take 1-2 Packages by mouth 5 (five) times daily as needed (for pain). , Disp: , Rfl:  .  cyclobenzaprine (FLEXERIL) 10 MG tablet, Take 10 mg by mouth 3 times/day as needed-between meals & bedtime for muscle spasms., Disp: , Rfl:  .  gabapentin (NEURONTIN) 300 MG capsule, Take 300 mg by mouth 3 (three) times daily. , Disp: , Rfl:  .  hydrOXYzine (ATARAX/VISTARIL) 50 MG tablet, Take 100 mg by mouth at bedtime. , Disp: , Rfl:  .  lurasidone (LATUDA) 40 MG TABS tablet, Take 40 mg by mouth daily with breakfast., Disp: , Rfl:  .  Multiple Vitamin (MULTIVITAMIN WITH MINERALS) TABS tablet, Take 1 tablet by mouth daily., Disp: , Rfl:  .  ondansetron (ZOFRAN ODT) 4 MG disintegrating tablet, Allow 1-2 tablets to dissolve in your mouth every 8 hours as needed for nausea/vomiting, Disp: 30 tablet, Rfl: 0 .  topiramate (TOPAMAX) 25 MG tablet, Take 50 mg by mouth 2 (two) times daily. , Disp: , Rfl:  .  traZODone (DESYREL) 100 MG tablet, Take 100 mg by mouth at bedtime., Disp: , Rfl:  .  vitamin B-12 (CYANOCOBALAMIN) 500 MCG tablet, Take 500 mcg by mouth daily., Disp: , Rfl:  .  albuterol (PROVENTIL HFA;VENTOLIN HFA) 108 (90 BASE) MCG/ACT inhaler, Inhale 2 puffs into the lungs every 4 (four) hours as needed for wheezing., Disp: 1 Inhaler, Rfl: 0 .  dicyclomine (BENTYL) 10 MG capsule, Take 1 capsule (10 mg total) by mouth 4 (four) times  daily -  before meals and at bedtime., Disp: 120 capsule, Rfl: 0 .  pantoprazole (PROTONIX) 40 MG tablet, Take 1 tablet (40 mg total) by mouth daily., Disp: 30 tablet, Rfl: 1  No family history on file.   Social History   Tobacco Use  . Smoking status: Current Every Day Smoker    Packs/day: 1.00  . Smokeless tobacco: Never Used  Substance Use Topics  . Alcohol use: No  . Drug use: Yes    Types: Marijuana    Allergies as of 03/01/2018  . (No Known Allergies)    Review of Systems:    All systems reviewed and negative except where noted in HPI.   Physical Exam:  BP 121/86 (BP Location: Left Arm, Patient Position: Sitting, Cuff Size: Large)   Pulse (!) 102   Resp 18   Wt 197 lb 9.6 oz (89.6 kg)   BMI 36.14 kg/m  No LMP recorded.  General:   Alert,  Well-developed, well-nourished, pleasant and cooperative in NAD Head:  Normocephalic and atraumatic. Eyes:  Sclera clear, no icterus.   Conjunctiva pink. Ears:  Normal auditory acuity. Nose:  No deformity, discharge, or lesions. Mouth:  No deformity or lesions,oropharynx pink & moist. Neck:  Supple; no masses or thyromegaly. Lungs:  Respirations even and unlabored.  Clear throughout to auscultation.   No wheezes, crackles, or rhonchi. No acute distress. Heart:  Regular rate and rhythm; no murmurs, clicks, rubs, or gallops. Abdomen:  Normal bowel sounds. Soft, epigastric tenderness and non-distended without masses, hepatosplenomegaly or hernias noted.  No guarding or rebound tenderness.   Rectal: Not performed Msk:  Symmetrical without gross deformities. Good, equal movement & strength bilaterally. Pulses:  Normal pulses noted. Extremities:  No clubbing or edema.  No cyanosis. Neurologic:  Alert and oriented x3;  grossly normal neurologically. Skin:  Intact without significant lesions or rashes. No jaundice. Lymph Nodes:  No significant cervical adenopathy. Psych:  Alert and cooperative. Normal mood and affect.  Imaging  Studies: Reviewed  Assessment and Plan:   CASI WESTERFELD is a 37 y.o. Caucasian female with history of bipolar, chronic lower abdominal pain, bloating, intermittent rectal bleeding, atypical chest pain and heartburn  Atypical chest pain and heartburn Continue Protonix 40 mg daily EGD with esophageal and gastric biopsies  Chronic lower abdominal pain associated with bloating and intermittent rectal bleeding Recommend colonoscopy for further evaluation as she is given a diagnosis of ulcerative colitis  Patient has been gaining weight I told her to check with her PCP if she was evaluated for hypothyroidism  Follow up in 2 months   Cephas Darby, MD

## 2018-03-02 ENCOUNTER — Emergency Department
Admission: EM | Admit: 2018-03-02 | Discharge: 2018-03-02 | Disposition: A | Discharge status: Home / Self Care | Payer: Medicaid Other | Attending: Emergency Medicine | Admitting: Emergency Medicine

## 2018-03-02 ENCOUNTER — Encounter: Payer: Self-pay | Admitting: Emergency Medicine

## 2018-03-02 ENCOUNTER — Other Ambulatory Visit: Payer: Self-pay

## 2018-03-02 ENCOUNTER — Emergency Department: Payer: Medicaid Other

## 2018-03-02 DIAGNOSIS — J45909 Unspecified asthma, uncomplicated: Secondary | ICD-10-CM | POA: Insufficient documentation

## 2018-03-02 DIAGNOSIS — Y999 Unspecified external cause status: Secondary | ICD-10-CM | POA: Diagnosis not present

## 2018-03-02 DIAGNOSIS — S62337A Displaced fracture of neck of fifth metacarpal bone, left hand, initial encounter for closed fracture: Secondary | ICD-10-CM | POA: Insufficient documentation

## 2018-03-02 DIAGNOSIS — W2209XA Striking against other stationary object, initial encounter: Secondary | ICD-10-CM | POA: Diagnosis not present

## 2018-03-02 DIAGNOSIS — F17210 Nicotine dependence, cigarettes, uncomplicated: Secondary | ICD-10-CM | POA: Insufficient documentation

## 2018-03-02 DIAGNOSIS — Z79899 Other long term (current) drug therapy: Secondary | ICD-10-CM | POA: Insufficient documentation

## 2018-03-02 DIAGNOSIS — Y9389 Activity, other specified: Secondary | ICD-10-CM | POA: Diagnosis not present

## 2018-03-02 DIAGNOSIS — S62339A Displaced fracture of neck of unspecified metacarpal bone, initial encounter for closed fracture: Secondary | ICD-10-CM

## 2018-03-02 DIAGNOSIS — S6992XA Unspecified injury of left wrist, hand and finger(s), initial encounter: Secondary | ICD-10-CM | POA: Diagnosis present

## 2018-03-02 DIAGNOSIS — Y929 Unspecified place or not applicable: Secondary | ICD-10-CM | POA: Insufficient documentation

## 2018-03-02 MED ORDER — MELOXICAM 15 MG PO TABS: 15.0000 mg | ORAL_TABLET | Freq: Every day | ORAL | 0 refills | Status: DC

## 2018-03-02 MED ORDER — ONDANSETRON 8 MG PO TBDP
8.0000 mg | ORAL_TABLET | Freq: Once | ORAL | Status: AC
  Administered 2018-03-02: 8 mg via ORAL
  Filled 2018-03-02: qty 1

## 2018-03-02 MED ORDER — OXYCODONE-ACETAMINOPHEN 5-325 MG PO TABS
1.0000 | ORAL_TABLET | Freq: Once | ORAL | Status: AC
  Administered 2018-03-02: 1 via ORAL
  Filled 2018-03-02: qty 1

## 2018-03-02 MED ORDER — HYDROCODONE-ACETAMINOPHEN 5-325 MG PO TABS: 1.0000 | ORAL_TABLET | ORAL | 0 refills | Status: DC | PRN

## 2018-03-02 NOTE — ED Provider Notes (Signed)
[facility] Emergency Department Provider Note  ____________________________________________  Time seen: Approximately 7:42 PM  I have reviewed the triage vital signs and the nursing notes.   HISTORY  Chief Complaint Hand Pain    HPI Victoria Pierce is a 37 y.o. female who presents the emergency department complaining of left hand injury/pain.  Patient reports that she became very upset today, punched the railing with her left hand.  Patient reports that she had immediate severe pain to the fifth metacarpal region.  She reports that she had immediate bruising and swelling to the area as well.  She denies any lacerations to the hand.  She is able to extend and flex the digit but doing so drastically increases the pain.  Patient denies any numbness or tingling in the digit.  No other injury or complaint.  No medications prior to arrival.    Past Medical History:  Diagnosis Date  . Anxiety   . Asthma   . Bipolar 1 disorder ([place])   . GERD (gastroesophageal reflux disease)   . Ulcerative colitis   . Vaginal septum     Patient Active Problem List   Diagnosis Date Noted  . Ulcerative colitis   . GERD (gastroesophageal reflux disease)   . Vaginal septum   . Anxiety     Past Surgical History:  Procedure Laterality Date  . TUBAL LIGATION      Prior to Admission medications   Medication Sig Start Date End Date Taking? Authorizing Provider  albuterol (PROVENTIL HFA;VENTOLIN HFA) 108 (90 BASE) MCG/ACT inhaler Inhale 2 puffs into the lungs every 4 (four) hours as needed for wheezing. 08/02/11 08/01/12  Rancour, Annie Main, MD  Aspirin-Salicylamide-Caffeine (BC FAST PAIN RELIEF) [phone] MG PACK Take 1-2 Packages by mouth 5 (five) times daily as needed (for pain).     [provider]  cyclobenzaprine (FLEXERIL) 10 MG tablet Take 10 mg by mouth 3 times/day as needed-between meals & bedtime for muscle spasms.    [provider]  dicyclomine  (BENTYL) 10 MG capsule Take 1 capsule (10 mg total) by mouth 4 (four) times daily -  before meals and at bedtime. 03/01/18 03/31/18  Lin Landsman, MD  gabapentin (NEURONTIN) 300 MG capsule Take 300 mg by mouth 3 (three) times daily.     [provider]  HYDROcodone-acetaminophen (NORCO/VICODIN) 5-325 MG tablet Take 1 tablet by mouth every 4 (four) hours as needed for moderate pain. 03/02/18   Khaliyah Northrop, Charline Bills, PA-C  hydrOXYzine (ATARAX/VISTARIL) 50 MG tablet Take 100 mg by mouth at bedtime.     [provider]  lurasidone (LATUDA) 40 MG TABS tablet Take 40 mg by mouth daily with breakfast.    [provider]  meloxicam (MOBIC) 15 MG tablet Take 1 tablet (15 mg total) by mouth daily. 03/02/18   Sevin Farone, Charline Bills, PA-C  Multiple Vitamin (MULTIVITAMIN WITH MINERALS) TABS tablet Take 1 tablet by mouth daily.    [provider]  ondansetron (ZOFRAN ODT) 4 MG disintegrating tablet Allow 1-2 tablets to dissolve in your mouth every 8 hours as needed for nausea/vomiting 05/06/16   Hinda Kehr, MD  pantoprazole (PROTONIX) 40 MG tablet Take 1 tablet (40 mg total) by mouth daily. 05/06/16 05/06/17  Hinda Kehr, MD  topiramate (TOPAMAX) 25 MG tablet Take 50 mg by mouth 2 (two) times daily.     [provider]  traZODone (DESYREL) 100 MG tablet Take 100 mg by mouth at bedtime.    [provider]  vitamin B-12 (CYANOCOBALAMIN) 500 MCG tablet Take 500 mcg by mouth daily.    [provider]    Allergies Patient has no known allergies.  No family history on file.  Social History Social History   Tobacco Use  . Smoking status: Current Every Day Smoker    Packs/day: 1.00    Types: Cigarettes  . Smokeless tobacco: Never Used  Substance Use Topics  . Alcohol use: No  . Drug use: Yes    Types: Marijuana     Review of Systems  Constitutional: No fever/chills Eyes: No visual changes. No discharge ENT: No upper respiratory  complaints. Cardiovascular: no chest pain. Respiratory: no cough. No SOB. Gastrointestinal: No abdominal pain.  No nausea, no vomiting.   Musculoskeletal: Positive for left hand injury/pain Skin: Negative for rash, abrasions, lacerations, ecchymosis. Neurological: Negative for headaches, focal weakness or numbness. 10-point ROS otherwise negative.  ____________________________________________   PHYSICAL EXAM:  VITAL SIGNS: ED Triage Vitals  Enc Vitals Group     BP 03/02/18 1849 (!) 153/44     Pulse Rate 03/02/18 1849 (!) 102     Resp 03/02/18 1849 18     Temp 03/02/18 1849 98.1 F (36.7 C)     Temp Source 03/02/18 1849 Oral     SpO2 03/02/18 1849 97 %     Weight 03/02/18 1851 197 lb (89.4 kg)     Height 03/02/18 1851 5' 3"  (1.6 m)     Head Circumference --      Peak Flow --      Pain Score 03/02/18 1850 9     Pain Loc --      Pain Edu? --      Excl. in [place]? --      Constitutional: Alert and oriented. Well appearing and in no acute distress. Eyes: Conjunctivae are normal. PERRL. EOMI. Head: Atraumatic. Neck: No stridor.    Cardiovascular: Normal rate, regular rhythm. Normal S1 and S2.  Good peripheral circulation. Respiratory: Normal respiratory effort without tachypnea or retractions. Lungs CTAB. Good air entry to the bases with no decreased or absent breath sounds. Musculoskeletal: Full range of motion to all extremities. No gross deformities appreciated.  Visualization of the left hand reveals edema, ecchymosis, deformity over the fifth metatarsal region.  Patient is exquisitely tender to palpation in this area.  Patient has palpable deformity over the distal fifth metacarpal.  Sensation and capillary refill intact distally.  No other visible injury to the left hand.  No tenderness to palpation.  No palpable abnormality. Neurologic:  Normal speech and language. No gross focal neurologic deficits are appreciated.  Skin:  Skin is warm, dry and intact. No rash  noted. Psychiatric: Mood and affect are normal. Speech and behavior are normal. Patient exhibits appropriate insight and judgement.   ____________________________________________   LABS (all labs ordered are listed, but only abnormal results are displayed)  Labs Reviewed - No data to display ____________________________________________  EKG   ____________________________________________  RADIOLOGY I personally viewed and evaluated these images as part of my medical decision making, as well as reviewing the written report by the radiologist.  I concur with the radiologist finding of acute, comminuted, angulated fifth metacarpal neck fracture.  Dg Hand Complete Left  Result Date: 03/02/2018 CLINICAL DATA:  Punched a stair railing.  Pain. EXAM: LEFT HAND - COMPLETE 3+ VIEW COMPARISON:  None. FINDINGS: Comminuted fracture of the left fifth metacarpal neck extending into the head with apex dorsal angulation. No other fracture or dislocation. No  radiopaque foreign body. Soft tissue swelling over the fifth MCP joint. IMPRESSION: 1. Acute comminuted fracture of the left fifth metacarpal neck extending into the head with apex dorsal angulation. Electronically Signed   By: Kathreen Devoid   On: 03/02/2018 19:07    ____________________________________________    PROCEDURES  Procedure(s) performed:    .Splint Application Date/Time: 47/84/1282 8:04 PM Performed by: Darletta Moll, PA-C Authorized by: Darletta Moll, PA-C   Consent:    Consent obtained:  Verbal   Consent given by:  Patient   Risks discussed:  Pain and swelling Pre-procedure details:    Sensation:  Normal Procedure details:    Laterality:  Left   Location:  Hand   Hand:  L hand   Splint type:  Ulnar gutter   Supplies:  Cotton padding, Ortho-Glass and elastic bandage Post-procedure details:    Pain:  Improved   Sensation:  Normal   Patient tolerance of procedure:  Tolerated well, no immediate  complications      Medications  oxyCODONE-acetaminophen (PERCOCET/ROXICET) 5-325 MG per tablet 1 tablet (1 tablet Oral Given 03/02/18 2002)  ondansetron (ZOFRAN-ODT) disintegrating tablet 8 mg (8 mg Oral Given 03/02/18 2002)     ____________________________________________   INITIAL IMPRESSION / ASSESSMENT AND PLAN / ED COURSE  Pertinent labs & imaging results that were available during my care of the patient were reviewed by me and considered in my medical decision making (see chart for details).  Review of the [place] CSRS was performed in accordance of the [place] prior to dispensing any controlled drugs.      Patient's diagnosis is consistent with boxer's fracture.  Patient presents into emergency department complaining of left hand pain after striking a solid object.  Patient became upset, took a swing at a sterilely.  Patient presents with ecchymosis, edema, deformity to the fifth metacarpal region.  X-ray confirms boxer's fracture.  Exam was otherwise reassuring with good range of motion to the digit, sensation and capillary refill intact.  Patient is given ulnar gutter splint, discharged with pain medication and meloxicam.  Follow-up with orthopedics. Patient is given ED precautions to return to the ED for any worsening or new symptoms.     ____________________________________________  FINAL CLINICAL IMPRESSION(S) / ED DIAGNOSES  Final diagnoses:  Closed boxer's fracture, initial encounter      NEW MEDICATIONS STARTED DURING THIS VISIT:  ED Discharge Orders         Ordered    meloxicam (MOBIC) 15 MG tablet  Daily     03/02/18 2004    HYDROcodone-acetaminophen (NORCO/VICODIN) 5-325 MG tablet  Every 4 hours PRN     03/02/18 2004              This chart was dictated using voice recognition software/Dragon. Despite best efforts to proofread, errors can occur which can change the meaning. Any change was purely unintentional.    Darletta Moll,  PA-C 03/02/18 2005    Earleen Newport, MD 03/02/18 2145

## 2018-03-02 NOTE — ED Triage Notes (Signed)
Punched hard object about 30 min ago, pain L hand.

## 2018-03-02 NOTE — ED Notes (Signed)
Hit  l  Hand  On a  Wooden  Wall   - the  Hand is  Swollen and  Has decreased rom  due  t swelling

## 2018-03-05 ENCOUNTER — Encounter: Payer: Self-pay | Admitting: Physical Therapy

## 2018-03-05 ENCOUNTER — Ambulatory Visit: Payer: Medicaid Other | Admitting: Physical Therapy

## 2018-03-05 DIAGNOSIS — M542 Cervicalgia: Secondary | ICD-10-CM

## 2018-03-05 NOTE — Therapy (Signed)
[facility] [address] [place], [address] Phone: [phone]   Fax:  [phone]  Physical Therapy Treatment  Patient Details  Name: Victoria Pierce MRN: [id number] Date of Birth: [birth year] Referring Provider (PT): Tomasa Hose   Encounter Date: 03/05/2018  PT End of Session - 03/05/18 1146    Visit Number  4    Number of Visits  12    Date for PT Re-Evaluation  04/02/18    Authorization Type  medicaid    PT Start Time  1115    PT Stop Time  1200    PT Time Calculation (min)  45 min    Activity Tolerance  Patient tolerated treatment well    Behavior During Therapy  [facility] for tasks assessed/performed       Past Medical History:  Diagnosis Date  . Anxiety   . Asthma   . Bipolar 1 disorder ([place])   . GERD (gastroesophageal reflux disease)   . Ulcerative colitis   . Vaginal septum     Past Surgical History:  Procedure Laterality Date  . TUBAL LIGATION      There were no vitals filed for this visit.  Subjective Assessment - 03/05/18 1120    Subjective  Patient reports her neck is doing better, but she still has some tension in the neck. Patient reports 4/10 pain today. Patient reports compliance with her HEP with no questions or concerns.     Pertinent History  Patient is 37 yo female that complains of chronic neck pain that started a couple of years ago. States her pain is getting worse overtime.  States it feels like she has "bubbles" and neck tension. Complains of difficulty moving her neck, L and R pain. Reports that it feels like it always needs to "pop". Difficulty with posture, Driving, bending over, difficulty sleeping (going to sleep). Patient reports that she often cracks her neck/mid back, and that her boyfriend helps, ~4x a day. Has seen a chiropractor in the past. PMH includes GERD, bipolar, depression/axniety, ulcerative colitis, smoker.    Limitations  House hold  activities;Reading;Other (comment)    How long can you sit comfortably?  NA    How long can you stand comfortably?  NA    How long can you walk comfortably?  NA    Diagnostic tests  xray    Patient Stated Goals  pain relief    Pain Onset  More than a month ago       Manual - STM withtrigger point releaseto bilat levator, and cervical paraspinals. Following:Dry Needling (2/2) 30m .30needles placed along the bilat cervical paraspinals/multifidi (inf/medial direction), and L levator (with plantar grasp)to decrease increased muscular spasms and trigger points with the patient positioned in supine. Patient was educated on risks and benefits of therapy and verbally consents to PT. - C0-C2 UPA grade I-II 30sec bouts 4 bouts each side for pain modulation; inc to grade III 6 bouts each side to inc rotation ROM  Therapeutic Activity - Cervical retractions x5 in supine  - Cervical retraction + lift x15 with 5 sec hold, max TC and VC initially for proper form without cervical ext with good carry over following - Seated b/l shoulder ER GTB 3x 10 with cuing initially for proper form with neutral posture, with good carry over following - Education on purpose of deep cervical flexor activation to restore length-tension relationship of cervical flexors vs. Extensors. Education on POC update to continue postural/shoulder  strengthening progression as able    ESTIM+ heat packHiVolt ESTIM82mn at patient tolerated55Vincreased to75V increased to 80V through treatmentatbilat levator and cspine parapsinals (around C5) Attemptedd/t success from prior treatment. With PT assessing patient tolerance throughout (increasing intensity as needed), monitoring skin integrity (normal), with decreased pain noted from patientfollowing, and inc ROM following. Discsussed POC update to reflect increased postural muscle strengthening over the next 4 weeks to restore proper length-tension relationship for pain  reduction, before patient will d/c to HEP for maintenance                        PT Education - 03/05/18 1147    Education Details  POC update    Person(s) Educated  Patient    Methods  Explanation    Comprehension  Verbalized understanding       PT Short Term Goals - 03/05/18 1122      PT SHORT TERM GOAL #1   Title  Patient will be adherent to initial HEP at least 3x a week in prep for self management of condition.    Baseline  03/05/18: able to complete independently    Time  6    Period  Weeks    Status  Achieved        PT Long Term Goals - 03/05/18 1122      PT LONG TERM GOAL #1   Title  The patient will demonstrate at least 10 point improvement in FOTO score indicating an increased ease of performing functional tasks.    Baseline  03/05/18 65    Time  6    Period  Weeks    Status  Achieved      PT LONG TERM GOAL #2   Title  Patient will be independent in home exercise program to improve strength/mobility for better functional independence with ADLs.    Baseline  03/05/18 able to complete independently    Time  6    Period  Weeks    Status  Achieved      PT LONG TERM GOAL #3   Title   Patient will be able to perform household work/chores with pain of 3/10 or less  to improve patient's tolerance to functional tasks.    Baseline  03/05/18 pain ranges from 2/10-4/10    Time  6    Period  Weeks    Status  On-going      PT LONG TERM GOAL #4   Title  Patient will increase BUE gross strength to 4+/5 as to improve functional strength for increased ADL/IADL ability.    Baseline  03/05/18 R/L: shoulder flex 5/5 abd 5/5 IR 4+/4+ ER 4/4 ext 5/5    Time  6    Period  Weeks    Status  Partially Met            Plan - 03/05/18 1219    Clinical Impression Statement  Patient is continuing to report good pain relief following manual and modality treatment. Patient is demonstratinggood carry over of proper postural alignment. Patient will continue to  benefit from skilled PT for pain management (as she has not met pain goal yet) and postural/shoulder strengthening to restore proper length/tension relationship at a frequency of 1x/week for 4 weeks before being D/C to robust HEP     Rehab Potential  Good    Clinical Impairments Affecting Rehab Potential  + motivation, decreased functional abilities, decreased sleep, decreased activity tolerance    PT Frequency  2x / week    PT Duration  6 weeks    PT Treatment/Interventions  ADLs/Self Care Home Management;Ultrasound;Neuromuscular re-education;Spinal Manipulations;Joint Manipulations;Passive range of motion;Patient/family education;Gait training;Canalith Repostioning;Cryotherapy;Dry needling;Energy conservation;Functional mobility training;Electrical Stimulation;Therapeutic activities;Iontophoresis 19m/ml Dexamethasone;Splinting;Taping;Vestibular;Manual techniques;Therapeutic exercise;Moist Heat;Traction    PT Next Visit Plan  STM, traction, postural strengthening, assess spine mobility    PT Home Exercise Plan  see patient instruction section    Consulted and Agree with Plan of Care  Patient       Patient will benefit from skilled therapeutic intervention in order to improve the following deficits and impairments:  Decreased endurance, Hypomobility, Decreased activity tolerance, Decreased strength, Increased fascial restricitons, Pain, Increased muscle spasms, Decreased mobility, Decreased range of motion, Postural dysfunction, Improper body mechanics  Visit Diagnosis: Cervicalgia     Problem List Patient Active Problem List   Diagnosis Date Noted  . Ulcerative colitis   . GERD (gastroesophageal reflux disease)   . Vaginal septum   . Anxiety    CShelton SilvasPT, DPT CShelton Silvas11/19/2019, 12:24 PM  [facility] [address] [place] [address]Phone: [phone]  Fax:  [phone] Name: Victoria TORENMRN: [id number] of Birth: 410/24/1982

## 2018-03-07 ENCOUNTER — Encounter
Admission: RE | Disposition: A | Discharge status: Home / Self Care | Payer: Self-pay | Source: Ambulatory Visit | Attending: Surgery

## 2018-03-07 ENCOUNTER — Ambulatory Visit: Payer: Medicaid Other | Admitting: Anesthesiology

## 2018-03-07 ENCOUNTER — Ambulatory Visit
Admission: RE | Admit: 2018-03-07 | Discharge: 2018-03-07 | Disposition: A | Discharge status: Home / Self Care | Payer: Medicaid Other | Source: Ambulatory Visit | Attending: Surgery | Admitting: Surgery

## 2018-03-07 ENCOUNTER — Other Ambulatory Visit: Payer: Self-pay

## 2018-03-07 DIAGNOSIS — S62337A Displaced fracture of neck of fifth metacarpal bone, left hand, initial encounter for closed fracture: Secondary | ICD-10-CM | POA: Insufficient documentation

## 2018-03-07 DIAGNOSIS — W2209XA Striking against other stationary object, initial encounter: Secondary | ICD-10-CM | POA: Insufficient documentation

## 2018-03-07 DIAGNOSIS — F17200 Nicotine dependence, unspecified, uncomplicated: Secondary | ICD-10-CM | POA: Insufficient documentation

## 2018-03-07 DIAGNOSIS — J45909 Unspecified asthma, uncomplicated: Secondary | ICD-10-CM | POA: Diagnosis not present

## 2018-03-07 DIAGNOSIS — F319 Bipolar disorder, unspecified: Secondary | ICD-10-CM | POA: Diagnosis not present

## 2018-03-07 DIAGNOSIS — Y929 Unspecified place or not applicable: Secondary | ICD-10-CM | POA: Diagnosis not present

## 2018-03-07 DIAGNOSIS — Z79899 Other long term (current) drug therapy: Secondary | ICD-10-CM | POA: Diagnosis not present

## 2018-03-07 DIAGNOSIS — F419 Anxiety disorder, unspecified: Secondary | ICD-10-CM | POA: Insufficient documentation

## 2018-03-07 DIAGNOSIS — K219 Gastro-esophageal reflux disease without esophagitis: Secondary | ICD-10-CM | POA: Diagnosis not present

## 2018-03-07 HISTORY — PX: CLOSED REDUCTION METACARPAL WITH PERCUTANEOUS PINNING: SHX5613

## 2018-03-07 LAB — URINE DRUG SCREEN, QUALITATIVE (ARMC ONLY)
Amphetamines, Ur Screen: NOT DETECTED
Barbiturates, Ur Screen: NOT DETECTED
Benzodiazepine, Ur Scrn: NOT DETECTED
COCAINE METABOLITE, UR ~~LOC~~: NOT DETECTED
Cannabinoid 50 Ng, Ur ~~LOC~~: POSITIVE — AB
MDMA (ECSTASY) UR SCREEN: NOT DETECTED
METHADONE SCREEN, URINE: NOT DETECTED
OPIATE, UR SCREEN: POSITIVE — AB
Phencyclidine (PCP) Ur S: NOT DETECTED
TRICYCLIC, UR SCREEN: NOT DETECTED

## 2018-03-07 LAB — POCT PREGNANCY, URINE: Preg Test, Ur: NEGATIVE

## 2018-03-07 SURGERY — CLOSED REDUCTION, FRACTURE, METACARPAL BONE, WITH PERCUTANEOUS PINNING
Anesthesia: General | Site: Hand | Laterality: Left

## 2018-03-07 MED ORDER — OXYCODONE HCL 5 MG/5ML PO SOLN: 5.0000 mg | Freq: Once | ORAL | Status: DC | PRN

## 2018-03-07 MED ORDER — FENTANYL CITRATE (PF) 100 MCG/2ML IJ SOLN
INTRAMUSCULAR | Status: AC
  Administered 2018-03-07: 25 ug via INTRAVENOUS
  Filled 2018-03-07: qty 2

## 2018-03-07 MED ORDER — FENTANYL CITRATE (PF) 100 MCG/2ML IJ SOLN
INTRAMUSCULAR | Status: AC
  Filled 2018-03-07: qty 2

## 2018-03-07 MED ORDER — MIDAZOLAM HCL 2 MG/2ML IJ SOLN
INTRAMUSCULAR | Status: AC
  Filled 2018-03-07: qty 2

## 2018-03-07 MED ORDER — MIDAZOLAM HCL 2 MG/2ML IJ SOLN
INTRAMUSCULAR | Status: DC | PRN
  Administered 2018-03-07: 2 mg via INTRAVENOUS

## 2018-03-07 MED ORDER — OXYCODONE HCL 5 MG PO TABS: 5.0000 mg | ORAL_TABLET | Freq: Once | ORAL | Status: DC | PRN

## 2018-03-07 MED ORDER — HYDROCODONE-ACETAMINOPHEN 5-325 MG PO TABS: 1.0000 | ORAL_TABLET | ORAL | 0 refills | Status: DC | PRN

## 2018-03-07 MED ORDER — LACTATED RINGERS IV SOLN: INTRAVENOUS | Status: DC

## 2018-03-07 MED ORDER — CEFAZOLIN SODIUM-DEXTROSE 2-4 GM/100ML-% IV SOLN
2.0000 g | Freq: Once | INTRAVENOUS | Status: AC
  Administered 2018-03-07: 2 g via INTRAVENOUS

## 2018-03-07 MED ORDER — FENTANYL CITRATE (PF) 100 MCG/2ML IJ SOLN
INTRAMUSCULAR | Status: DC | PRN
  Administered 2018-03-07 (×3): 50 ug via INTRAVENOUS
  Administered 2018-03-07: 100 ug via INTRAVENOUS
  Administered 2018-03-07: 50 ug via INTRAVENOUS

## 2018-03-07 MED ORDER — LACTATED RINGERS IV SOLN
INTRAVENOUS | Status: DC | PRN
  Administered 2018-03-07: 14:00:00 via INTRAVENOUS

## 2018-03-07 MED ORDER — PROPOFOL 10 MG/ML IV BOLUS
INTRAVENOUS | Status: AC
  Filled 2018-03-07: qty 20

## 2018-03-07 MED ORDER — PROMETHAZINE HCL 25 MG/ML IJ SOLN
INTRAMUSCULAR | Status: AC
  Administered 2018-03-07: 12.5 mg via INTRAVENOUS
  Filled 2018-03-07: qty 1

## 2018-03-07 MED ORDER — SODIUM CHLORIDE FLUSH 0.9 % IV SOLN
INTRAVENOUS | Status: AC
  Filled 2018-03-07: qty 10

## 2018-03-07 MED ORDER — PROMETHAZINE HCL 25 MG/ML IJ SOLN
6.2500 mg | INTRAMUSCULAR | Status: DC | PRN
  Administered 2018-03-07: 12.5 mg via INTRAVENOUS

## 2018-03-07 MED ORDER — CEFAZOLIN SODIUM-DEXTROSE 2-4 GM/100ML-% IV SOLN
INTRAVENOUS | Status: AC
  Filled 2018-03-07: qty 100

## 2018-03-07 MED ORDER — DEXAMETHASONE SODIUM PHOSPHATE 10 MG/ML IJ SOLN
INTRAMUSCULAR | Status: DC | PRN
  Administered 2018-03-07 (×2): 5 mg via INTRAVENOUS

## 2018-03-07 MED ORDER — ONDANSETRON HCL 4 MG/2ML IJ SOLN
INTRAMUSCULAR | Status: DC | PRN
  Administered 2018-03-07: 4 mg via INTRAVENOUS

## 2018-03-07 MED ORDER — FENTANYL CITRATE (PF) 100 MCG/2ML IJ SOLN
25.0000 ug | INTRAMUSCULAR | Status: AC | PRN
  Administered 2018-03-07 (×6): 25 ug via INTRAVENOUS

## 2018-03-07 MED ORDER — LIDOCAINE HCL (CARDIAC) PF 100 MG/5ML IV SOSY
PREFILLED_SYRINGE | INTRAVENOUS | Status: DC | PRN
  Administered 2018-03-07: 100 mg via INTRAVENOUS

## 2018-03-07 MED ORDER — BUPIVACAINE HCL (PF) 0.5 % IJ SOLN
INTRAMUSCULAR | Status: DC | PRN
  Administered 2018-03-07: 10 mL

## 2018-03-07 MED ORDER — PROPOFOL 10 MG/ML IV BOLUS
INTRAVENOUS | Status: DC | PRN
  Administered 2018-03-07: 200 mg via INTRAVENOUS

## 2018-03-07 MED ORDER — LACTATED RINGERS IV SOLN
INTRAVENOUS | Status: DC
  Administered 2018-03-07: 11:00:00 via INTRAVENOUS

## 2018-03-07 MED ORDER — MEPERIDINE HCL 50 MG/ML IJ SOLN: 6.2500 mg | INTRAMUSCULAR | Status: DC | PRN

## 2018-03-07 SURGICAL SUPPLY — 24 items
.045 Cwire ×4 IMPLANT
BLADE SURG 15 STRL LF DISP TIS (BLADE) ×1 IMPLANT
BLADE SURG 15 STRL SS (BLADE) ×3
CHLORAPREP W/TINT 26ML (MISCELLANEOUS) ×3 IMPLANT
COVER WAND RF STERILE (DRAPES) ×3 IMPLANT
CUFF TOURN 18 STER (MISCELLANEOUS) IMPLANT
ELECT CAUTERY BLADE 6.4 (BLADE) ×3 IMPLANT
GAUZE SPONGE 4X4 12PLY STRL (GAUZE/BANDAGES/DRESSINGS) ×3 IMPLANT
GLOVE BIO SURGEON STRL SZ8 (GLOVE) ×3 IMPLANT
GLOVE BIO SURGEON STRL SZ8.5 (GLOVE) ×3 IMPLANT
GLOVE INDICATOR 8.0 STRL GRN (GLOVE) ×3 IMPLANT
GLOVE SURG ORTHO 8.0 STRL STRW (GLOVE) ×3 IMPLANT
GOWN STRL REUS W/ TWL LRG LVL3 (GOWN DISPOSABLE) ×1 IMPLANT
GOWN STRL REUS W/ TWL XL LVL3 (GOWN DISPOSABLE) ×1 IMPLANT
GOWN STRL REUS W/TWL LRG LVL3 (GOWN DISPOSABLE) ×3
GOWN STRL REUS W/TWL XL LVL3 (GOWN DISPOSABLE) ×3
KIT TURNOVER KIT A (KITS) ×3 IMPLANT
PACK BASIC III (MISCELLANEOUS) ×3
PACK SRG BSC III STRL LF (MISCELLANEOUS) ×1 IMPLANT
PAD CAST CTTN 4X4 STRL (SOFTGOODS) ×1 IMPLANT
PADDING CAST COTTON 4X4 STRL (SOFTGOODS) ×3
SPLINT CAST 1 STEP 3X12 (MISCELLANEOUS) ×2 IMPLANT
STRAP SAFETY 5IN WIDE (MISCELLANEOUS) ×3 IMPLANT
WIRE Z .045 C-WIRE SPADE TIP (WIRE) ×2 IMPLANT

## 2018-03-07 NOTE — Anesthesia Procedure Notes (Addendum)
Procedure Name: LMA Insertion Date/Time: 03/07/2018 2:41 PM Performed by: Nelda Marseille, CRNA Pre-anesthesia Checklist: Patient identified, Patient being monitored, Timeout performed, Emergency Drugs available and Suction available Patient Re-evaluated:Patient Re-evaluated prior to induction Oxygen Delivery Method: Circle system utilized Preoxygenation: Pre-oxygenation with 100% oxygen Induction Type: IV induction Ventilation: Mask ventilation without difficulty LMA: LMA inserted LMA Size: 4.0 Tube type: Oral Number of attempts: 1 Placement Confirmation: positive ETCO2 and breath sounds checked- equal and bilateral Tube secured with: Tape Dental Injury: Teeth and Oropharynx as per pre-operative assessment

## 2018-03-07 NOTE — H&P (Signed)
Paper H&P to be scanned into permanent record. H&P reviewed and patient re-examined. No changes. 

## 2018-03-07 NOTE — Op Note (Signed)
03/07/2018  3:30 PM  Patient:   Victoria Pierce  Pre-Op Diagnosis:   Closed displaced left fifth metacarpal neck fracture.  Post-Op Diagnosis:   Same  Procedure:   Closed reduction and percutaneous pinning of closed displaced left fifth metacarpal neck fracture.  Surgeon:   Pascal Lux, MD  Assistant:   Phoebe Sharps, PA-S  Anesthesia:   General LMA  Findings:   As above.  Complications:   None  Fluids:   700 cc crystalloid  EBL:   5 cc  UOP:   None  TT:   None  Drains:   None  Closure:   None  Implants:   0.045 K wires x2  Brief Clinical Note:   The patient is a 37 year old female who sustained the above-noted injury several days ago when she apparently punched a stair railing in a fit of anger. She presented to the emergency room where x-rays demonstrated the above-noted injury. She was placed in a splint followed up in orthopedics. She presents at this time for definitive management of her injury.  Procedure:   The patient was brought into the operating room and lain in the supine position. After adequate general laryngeal mask anesthesia was obtained, the patient's left hand and upper extremity were prepped with ChloraPrep solution before being draped sterilely. Preoperative antibiotics were administered. A timeout was performed to verify the appropriate surgical site before the finger was reduced using closed manipulation. The adequacy of reduction was verified using FluoroScan imaging in AP, lateral, and oblique projections and found to be excellent. The fracture was stabilized using two 0.045 K wires. Each was placed percutaneously and advanced from distal to proximal in a crisscross fashion to stabilize the fracture. The adequacy of pin position and fracture reduction again was verified using fluoroscopy FluoroScan imaging in AP and lateral projections and found to be excellent. The pins were cut just below the skin before a sterile bulky dressing was applied to  the hand. The patient was placed into an ulnar gutter splint with the wrist in some extension and the MCP joints light of the ring and little fingers flexed to as near 90 degrees as possible. The patient was then awakened, extubated, and returned to the recovery room in satisfactory condition.

## 2018-03-07 NOTE — Anesthesia Postprocedure Evaluation (Signed)
Anesthesia Post Note  Patient: DAYRIN STALLONE  Procedure(s) Performed: CLOSED REDUCTION METACARPAL WITH PERCUTANEOUS PINNING-5th metacarpal (Left Hand)  Patient location during evaluation: PACU Anesthesia Type: General Level of consciousness: awake and alert and oriented Pain management: pain level controlled Vital Signs Assessment: post-procedure vital signs reviewed and stable Respiratory status: spontaneous breathing, nonlabored ventilation and respiratory function stable Cardiovascular status: blood pressure returned to baseline and stable Postop Assessment: no signs of nausea or vomiting Anesthetic complications: no     Last Vitals:  Vitals:   03/07/18 1555 03/07/18 1600  BP: (!) 129/101   Pulse: (!) 101 94  Resp: 16 10  Temp:    SpO2: 93% 93%    Last Pain:  Vitals:   03/07/18 1555  TempSrc:   PainSc: 5                  Mariza Bourget

## 2018-03-07 NOTE — Anesthesia Post-op Follow-up Note (Signed)
Anesthesia QCDR form completed.        

## 2018-03-07 NOTE — Anesthesia Preprocedure Evaluation (Signed)
Anesthesia Evaluation  Patient identified by MRN, date of birth, ID band Patient awake    Reviewed: Allergy & Precautions, NPO status , Patient's Chart, lab work & pertinent test results  History of Anesthesia Complications Negative for: history of anesthetic complications  Airway Mallampati: II  TM Distance: >3 FB Neck ROM: Full    Dental  (+) Poor Dentition   Pulmonary asthma , Current Smoker,    breath sounds clear to auscultation- rhonchi (-) wheezing      Cardiovascular Exercise Tolerance: Good (-) hypertension(-) CAD, (-) Past MI, (-) Cardiac Stents and (-) CABG  Rhythm:Regular Rate:Normal - Systolic murmurs and - Diastolic murmurs    Neuro/Psych PSYCHIATRIC DISORDERS Anxiety Bipolar Disorder negative neurological ROS     GI/Hepatic Neg liver ROS, PUD, GERD  ,  Endo/Other  negative endocrine ROSneg diabetes  Renal/GU negative Renal ROS     Musculoskeletal negative musculoskeletal ROS (+)   Abdominal (+) + obese,   Peds  Hematology negative hematology ROS (+)   Anesthesia Other Findings Past Medical History: No date: Anxiety No date: Asthma No date: Bipolar 1 disorder (H[facility]) No date: GERD (gastroesophageal reflux disease) No date: Ulcerative colitis No date: Vaginal septum   Reproductive/Obstetrics                             Anesthesia Physical Anesthesia Plan  ASA: II  Anesthesia Plan: General   Post-op Pain Management:    Induction: Intravenous  PONV Risk Score and Plan: 1 and Ondansetron and Midazolam  Airway Management Planned: LMA  Additional Equipment:   Intra-op Plan:   Post-operative Plan:   Informed Consent: I have reviewed the patients History and Physical, chart, labs and discussed the procedure including the risks, benefits and alternatives for the proposed anesthesia with the patient or authorized representative who has indicated his/her understanding  and acceptance.   Dental advisory given  Plan Discussed with: CRNA and Anesthesiologist  Anesthesia Plan Comments:         Anesthesia Quick Evaluation

## 2018-03-07 NOTE — Transfer of Care (Signed)
Immediate Anesthesia Transfer of Care Note  Patient: Victoria Pierce  Procedure(s) Performed: CLOSED REDUCTION METACARPAL WITH PERCUTANEOUS PINNING-5th metacarpal (Left Hand)  Patient Location: PACU  Anesthesia Type:General  Level of Consciousness: awake, alert  and oriented  Airway & Oxygen Therapy: Patient Spontanous Breathing and Patient connected to face mask oxygen  Post-op Assessment: Report given to RN and Post -op Vital signs reviewed and stable  Post vital signs: Reviewed and stable  Last Vitals:  Vitals Value Taken Time  BP 131/95 03/07/2018  3:30 PM  Temp 36.3 C 03/07/2018  3:30 PM  Pulse 116 03/07/2018  3:35 PM  Resp 11 03/07/2018  3:35 PM  SpO2 99 % 03/07/2018  3:35 PM  Vitals shown include unvalidated device data.  Last Pain:  Vitals:   03/07/18 1034  TempSrc: Oral  PainSc: 7          Complications: No apparent anesthesia complications

## 2018-03-07 NOTE — Discharge Instructions (Addendum)
Orthopedic discharge instructions: Keep splint dry and intact. Keep hand elevated above heart level. Apply ice to affected area frequently. Resume Mobic 15 mg daily with food. Take pain medication as prescribed or ES Tylenol when needed.  Return for follow-up in 10-14 days or as scheduled.  AMBULATORY SURGERY  DISCHARGE INSTRUCTIONS   1) The drugs that you were given will stay in your system until tomorrow so for the next 24 hours you should not:  A) Drive an automobile B) Make any legal decisions C) Drink any alcoholic beverage   2) You may resume regular meals tomorrow.  Today it is better to start with liquids and gradually work up to solid foods.  You may eat anything you prefer, but it is better to start with liquids, then soup and crackers, and gradually work up to solid foods.   3) Please notify your doctor immediately if you have any unusual bleeding, trouble breathing, redness and pain at the surgery site, drainage, fever, or pain not relieved by medication.    4) Additional Instructions:        Please contact your physician with any problems or Same Day Surgery at [phone], Monday through Friday 6 am to 4 pm, or [place] [facility] number at [phone].

## 2018-03-08 ENCOUNTER — Encounter: Payer: Self-pay | Admitting: Surgery

## 2018-03-19 ENCOUNTER — Encounter: Admission: RE | Payer: Self-pay | Source: Ambulatory Visit

## 2018-03-19 ENCOUNTER — Ambulatory Visit: Admission: RE | Admit: 2018-03-19 | Payer: Medicaid Other | Source: Ambulatory Visit | Admitting: Gastroenterology

## 2018-03-19 SURGERY — ESOPHAGOGASTRODUODENOSCOPY (EGD) WITH PROPOFOL
Anesthesia: General

## 2018-03-20 ENCOUNTER — Ambulatory Visit: Payer: Medicaid Other | Attending: Family Medicine | Admitting: Physical Therapy

## 2018-03-20 ENCOUNTER — Encounter: Payer: Self-pay | Admitting: Physical Therapy

## 2018-03-20 DIAGNOSIS — M542 Cervicalgia: Secondary | ICD-10-CM | POA: Diagnosis not present

## 2018-03-20 NOTE — Therapy (Signed)
[facility] [address] [place], [address] Phone: [phone]   Fax:  [phone]  Physical Therapy Treatment  Patient Details  Name: Victoria Pierce MRN: [id number] Date of Birth: [birth year] Referring Provider (PT): Tomasa Hose   Encounter Date: 03/20/2018  PT End of Session - 03/20/18 1152    Visit Number  1    Number of Visits  8    Date for PT Re-Evaluation  04/16/18    PT Start Time  1115    PT Stop Time  1200    PT Time Calculation (min)  45 min    Activity Tolerance  Patient tolerated treatment well    Behavior During Therapy  [facility] for tasks assessed/performed       Past Medical History:  Diagnosis Date  . Anxiety   . Asthma   . Bipolar 1 disorder ([place])   . GERD (gastroesophageal reflux disease)   . Ulcerative colitis   . Vaginal septum     Past Surgical History:  Procedure Laterality Date  . CLOSED REDUCTION METACARPAL WITH PERCUTANEOUS PINNING Left 03/07/2018   Procedure: CLOSED REDUCTION METACARPAL WITH PERCUTANEOUS PINNING-5th metacarpal;  Surgeon: Corky Mull, MD;  Location: [facility] ORS;  Service: Orthopedics;  Laterality: Left;  . TUBAL LIGATION      There were no vitals filed for this visit.  Subjective Assessment - 03/20/18 1121    Subjective  Patient reports since last visit she has had "pins" put in her 5th finger to fixate fracture, which she reports is giving her pain. Patient reports her neck feels "pretty good" and she thinks sleeping on her L side and back has helped a lot with this. Patient reports 4-5/10 neck pain today. Reports compliance with her HEP with what she is able to do with 5th met fx.     Pertinent History  Patient is 37 yo female that complains of chronic neck pain that started a couple of years ago. States her pain is getting worse overtime.  States it feels like she has "bubbles" and neck tension. Complains of difficulty moving her neck, L and R pain.  Reports that it feels like it always needs to "pop". Difficulty with posture, Driving, bending over, difficulty sleeping (going to sleep). Patient reports that she often cracks her neck/mid back, and that her boyfriend helps, ~4x a day. Has seen a chiropractor in the past. PMH includes GERD, bipolar, depression/axniety, ulcerative colitis, smoker.    Limitations  House hold activities;Reading;Other (comment)    How long can you sit comfortably?  NA    How long can you stand comfortably?  NA    How long can you walk comfortably?  NA    Diagnostic tests  xray    Patient Stated Goals  pain relief    Pain Onset  More than a month ago      Manual - STM withtrigger point releaseto bilat levator, and cervical paraspinals. Following:Dry Needling (2/2) 20m .30needles placed along thebilat UT and levator (with plantar grasp)to decrease increased muscular spasms and trigger points with the patient positioned in supine. Patient was educated on risks and benefits of therapy and verbally consents to PT. - C0-C2 UPA grade I-II 30sec bouts 4 bouts each side for pain modulation; inc to grade III 6 bouts each side to inc rotation ROM - Cervical traction 10sec traction/10sec relax x6  Ther-Ex - YTI 3x 10 in each position with demo and max TC initially for  proper form without shoulder hiking - Education on strengthening postural muscles to prevent from tension for maladaptive posture, and compensating shoulder hiking with ADLs   ESTIM+ heat packHiVolt ESTIM52mn at patient tolerated65Vincreased to70Vincreased to 8Rosemountlevator and UT.Attemptedd/t success from prior treatment. With PT assessing patient tolerance throughout (increasing intensity as needed), monitoring skin integrity (normal), with decreased pain noted from patientfollowing, and inc ROM following.                          PT Education - 03/20/18 1144    Education Details   exercise form    Person(s) Educated  Patient    Methods  Explanation;Tactile cues;Verbal cues    Comprehension  Verbalized understanding;Verbal cues required;Tactile cues required       PT Short Term Goals - 03/05/18 1122      PT SHORT TERM GOAL #1   Title  Patient will be adherent to initial HEP at least 3x a week in prep for self management of condition.    Baseline  03/05/18: able to complete independently    Time  6    Period  Weeks    Status  Achieved        PT Long Term Goals - 03/05/18 1122      PT LONG TERM GOAL #1   Title  The patient will demonstrate at least 10 point improvement in FOTO score indicating an increased ease of performing functional tasks.    Baseline  03/05/18 65    Time  6    Period  Weeks    Status  Achieved      PT LONG TERM GOAL #2   Title  Patient will be independent in home exercise program to improve strength/mobility for better functional independence with ADLs.    Baseline  03/05/18 able to complete independently    Time  6    Period  Weeks    Status  Achieved      PT LONG TERM GOAL #3   Title   Patient will be able to perform household work/chores with pain of 3/10 or less  to improve patient's tolerance to functional tasks.    Baseline  03/05/18 pain ranges from 2/10-4/10    Time  6    Period  Weeks    Status  On-going      PT LONG TERM GOAL #4   Title  Patient will increase BUE gross strength to 4+/5 as to improve functional strength for increased ADL/IADL ability.    Baseline  03/05/18 R/L: shoulder flex 5/5 abd 5/5 IR 4+/4+ ER 4/4 ext 5/5    Time  6    Period  Weeks    Status  Partially Met            Plan - 03/20/18 1155    Clinical Impression Statement  Patient continues to report no pain with full cervical ROM following manual + modality techniques. PT continued to progress therex as able d/t patients secondary medical condition (boxer fx casted) with cuing needed for proper form. Patient reports no pain through  therex, with noted muscle fatigue. Patient will continue to benefit from skilled PT to address postural strength deficits and for pain management.     Rehab Potential  Good    Clinical Impairments Affecting Rehab Potential  + motivation, decreased functional abilities, decreased sleep, decreased activity tolerance    PT Frequency  2x / week    PT Duration  6 weeks    PT Treatment/Interventions  ADLs/Self Care Home Management;Ultrasound;Neuromuscular re-education;Spinal Manipulations;Joint Manipulations;Passive range of motion;Patient/family education;Gait training;Canalith Repostioning;Cryotherapy;Dry needling;Energy conservation;Functional mobility training;Electrical Stimulation;Therapeutic activities;Iontophoresis 51m/ml Dexamethasone;Splinting;Taping;Vestibular;Manual techniques;Therapeutic exercise;Moist Heat;Traction    PT Next Visit Plan  STM, traction, postural strengthening, assess spine mobility    PT Home Exercise Plan  see patient instruction section    Consulted and Agree with Plan of Care  Patient       Patient will benefit from skilled therapeutic intervention in order to improve the following deficits and impairments:  Decreased endurance, Hypomobility, Decreased activity tolerance, Decreased strength, Increased fascial restricitons, Pain, Increased muscle spasms, Decreased mobility, Decreased range of motion, Postural dysfunction, Improper body mechanics  Visit Diagnosis: Cervicalgia     Problem List Patient Active Problem List   Diagnosis Date Noted  . Ulcerative colitis   . GERD (gastroesophageal reflux disease)   . Vaginal septum   . Anxiety    CShelton SilvasPT, DPT CShelton Silvas12/07/2017, 11:58 AM  [facility] [address] [place] [address]Phone: [phone]  Fax:  [phone] Name: Victoria CARBINEMRN: [id number] of Birth: [birth year]

## 2018-03-27 ENCOUNTER — Ambulatory Visit: Payer: Medicaid Other | Admitting: Physical Therapy

## 2018-03-27 ENCOUNTER — Encounter: Payer: Self-pay | Admitting: *Deleted

## 2018-03-27 ENCOUNTER — Other Ambulatory Visit: Payer: Self-pay

## 2018-04-02 ENCOUNTER — Ambulatory Visit: Payer: Medicaid Other | Admitting: Physical Therapy

## 2018-04-03 ENCOUNTER — Ambulatory Visit
Admission: RE | Admit: 2018-04-03 | Discharge: 2018-04-03 | Disposition: A | Discharge status: Home / Self Care | Payer: Medicaid Other | Attending: Surgery | Admitting: Surgery

## 2018-04-03 ENCOUNTER — Encounter: Payer: Medicaid Other | Admitting: Physical Therapy

## 2018-04-03 ENCOUNTER — Ambulatory Visit: Payer: Medicaid Other | Admitting: Anesthesiology

## 2018-04-03 ENCOUNTER — Encounter
Admission: RE | Disposition: A | Discharge status: Home / Self Care | Payer: Self-pay | Source: Home / Self Care | Attending: Surgery

## 2018-04-03 DIAGNOSIS — K5190 Ulcerative colitis, unspecified, without complications: Secondary | ICD-10-CM | POA: Diagnosis not present

## 2018-04-03 DIAGNOSIS — F419 Anxiety disorder, unspecified: Secondary | ICD-10-CM | POA: Insufficient documentation

## 2018-04-03 DIAGNOSIS — F319 Bipolar disorder, unspecified: Secondary | ICD-10-CM | POA: Insufficient documentation

## 2018-04-03 DIAGNOSIS — Z472 Encounter for removal of internal fixation device: Secondary | ICD-10-CM | POA: Insufficient documentation

## 2018-04-03 DIAGNOSIS — K279 Peptic ulcer, site unspecified, unspecified as acute or chronic, without hemorrhage or perforation: Secondary | ICD-10-CM | POA: Diagnosis not present

## 2018-04-03 DIAGNOSIS — K219 Gastro-esophageal reflux disease without esophagitis: Secondary | ICD-10-CM | POA: Diagnosis not present

## 2018-04-03 DIAGNOSIS — F17200 Nicotine dependence, unspecified, uncomplicated: Secondary | ICD-10-CM | POA: Diagnosis not present

## 2018-04-03 DIAGNOSIS — Z79899 Other long term (current) drug therapy: Secondary | ICD-10-CM | POA: Insufficient documentation

## 2018-04-03 DIAGNOSIS — S62337A Displaced fracture of neck of fifth metacarpal bone, left hand, initial encounter for closed fracture: Secondary | ICD-10-CM | POA: Diagnosis present

## 2018-04-03 DIAGNOSIS — J45909 Unspecified asthma, uncomplicated: Secondary | ICD-10-CM | POA: Insufficient documentation

## 2018-04-03 HISTORY — PX: HARDWARE REMOVAL: SHX979

## 2018-04-03 SURGERY — REMOVAL, HARDWARE
Anesthesia: Regional | Site: Hand | Laterality: Left

## 2018-04-03 MED ORDER — OXYCODONE HCL 5 MG PO TABS
5.0000 mg | ORAL_TABLET | Freq: Once | ORAL | Status: AC | PRN
  Administered 2018-04-03: 5 mg via ORAL

## 2018-04-03 MED ORDER — LACTATED RINGERS IV SOLN
INTRAVENOUS | Status: DC
  Administered 2018-04-03: 11:00:00 via INTRAVENOUS

## 2018-04-03 MED ORDER — POTASSIUM CHLORIDE IN NACL 20-0.9 MEQ/L-% IV SOLN: INTRAVENOUS | Status: DC

## 2018-04-03 MED ORDER — ACETAMINOPHEN 325 MG PO TABS: 325.0000 mg | ORAL_TABLET | ORAL | Status: DC | PRN

## 2018-04-03 MED ORDER — HYDROCODONE-ACETAMINOPHEN 5-325 MG PO TABS: 1.0000 | ORAL_TABLET | ORAL | Status: DC | PRN

## 2018-04-03 MED ORDER — HYDROCODONE-ACETAMINOPHEN 5-325 MG PO TABS: 1.0000 | ORAL_TABLET | ORAL | 0 refills | Status: DC | PRN

## 2018-04-03 MED ORDER — HYDROMORPHONE HCL 1 MG/ML IJ SOLN: 0.2500 mg | INTRAMUSCULAR | Status: DC | PRN

## 2018-04-03 MED ORDER — BUPIVACAINE HCL (PF) 0.5 % IJ SOLN
INTRAMUSCULAR | Status: DC | PRN
  Administered 2018-04-03: 6 mL

## 2018-04-03 MED ORDER — ACETAMINOPHEN 160 MG/5ML PO SOLN: 325.0000 mg | ORAL | Status: DC | PRN

## 2018-04-03 MED ORDER — OXYCODONE HCL 5 MG/5ML PO SOLN: 5.0000 mg | Freq: Once | ORAL | Status: AC | PRN

## 2018-04-03 MED ORDER — ONDANSETRON HCL 4 MG/2ML IJ SOLN: 4.0000 mg | Freq: Once | INTRAMUSCULAR | Status: DC | PRN

## 2018-04-03 MED ORDER — PROPOFOL 500 MG/50ML IV EMUL
INTRAVENOUS | Status: DC | PRN
  Administered 2018-04-03: 120 ug/kg/min via INTRAVENOUS

## 2018-04-03 MED ORDER — METOCLOPRAMIDE HCL 5 MG/ML IJ SOLN: 5.0000 mg | Freq: Three times a day (TID) | INTRAMUSCULAR | Status: DC | PRN

## 2018-04-03 MED ORDER — MIDAZOLAM HCL 5 MG/5ML IJ SOLN
INTRAMUSCULAR | Status: DC | PRN
  Administered 2018-04-03: 2 mg via INTRAVENOUS

## 2018-04-03 MED ORDER — ONDANSETRON HCL 4 MG PO TABS: 4.0000 mg | ORAL_TABLET | Freq: Four times a day (QID) | ORAL | Status: DC | PRN

## 2018-04-03 MED ORDER — ONDANSETRON HCL 4 MG/2ML IJ SOLN: 4.0000 mg | Freq: Four times a day (QID) | INTRAMUSCULAR | Status: DC | PRN

## 2018-04-03 MED ORDER — FENTANYL CITRATE (PF) 100 MCG/2ML IJ SOLN
INTRAMUSCULAR | Status: DC | PRN
  Administered 2018-04-03: 25 ug via INTRAVENOUS
  Administered 2018-04-03: 50 ug via INTRAVENOUS
  Administered 2018-04-03: 25 ug via INTRAVENOUS

## 2018-04-03 MED ORDER — LIDOCAINE HCL (PF) 0.5 % IJ SOLN
INTRAMUSCULAR | Status: DC | PRN
  Administered 2018-04-03: 50 mL via INTRAVENOUS

## 2018-04-03 MED ORDER — METOCLOPRAMIDE HCL 5 MG PO TABS: 5.0000 mg | ORAL_TABLET | Freq: Three times a day (TID) | ORAL | Status: DC | PRN

## 2018-04-03 MED ORDER — DEXTROSE 5 % IV SOLN
2000.0000 mg | Freq: Once | INTRAVENOUS | Status: AC
  Administered 2018-04-03: 2000 mg via INTRAVENOUS

## 2018-04-03 SURGICAL SUPPLY — 22 items
BANDAGE ELASTIC 6 LF NS (GAUZE/BANDAGES/DRESSINGS) ×3 IMPLANT
BNDG CMPR MED 5X6 ELC HKLP NS (GAUZE/BANDAGES/DRESSINGS) ×1
BNDG COHESIVE 4X5 TAN STRL (GAUZE/BANDAGES/DRESSINGS) ×3 IMPLANT
CHLORAPREP W/TINT 26ML (MISCELLANEOUS) ×4 IMPLANT
COVER LIGHT HANDLE UNIVERSAL (MISCELLANEOUS) ×6 IMPLANT
DRAPE FLUOR MINI C-ARM 54X84 (DRAPES) ×2 IMPLANT
GAUZE PETRO XEROFOAM 1X8 (MISCELLANEOUS) ×3 IMPLANT
GAUZE SPONGE 4X4 12PLY STRL (GAUZE/BANDAGES/DRESSINGS) ×2 IMPLANT
GLOVE BIO SURGEON STRL SZ8 (GLOVE) ×6 IMPLANT
GLOVE INDICATOR 8.0 STRL GRN (GLOVE) ×3 IMPLANT
GOWN STRL REUS W/ TWL LRG LVL3 (GOWN DISPOSABLE) ×1 IMPLANT
GOWN STRL REUS W/ TWL XL LVL3 (GOWN DISPOSABLE) ×1 IMPLANT
GOWN STRL REUS W/TWL LRG LVL3 (GOWN DISPOSABLE) ×3
GOWN STRL REUS W/TWL XL LVL3 (GOWN DISPOSABLE) ×3
KIT TURNOVER KIT A (KITS) ×3 IMPLANT
NS IRRIG 500ML POUR BTL (IV SOLUTION) ×3 IMPLANT
PACK EXTREMITY ARMC (MISCELLANEOUS) ×3 IMPLANT
PENCIL SMOKE EVACUATOR (MISCELLANEOUS) ×3 IMPLANT
STOCKINETTE IMPERVIOUS 9X36 MD (GAUZE/BANDAGES/DRESSINGS) ×2 IMPLANT
STRAP BODY AND KNEE 60X3 (MISCELLANEOUS) ×3 IMPLANT
SUT PROLENE 4 0 PS 2 18 (SUTURE) ×2 IMPLANT
SYR 10ML LL (SYRINGE) ×2 IMPLANT

## 2018-04-03 NOTE — Anesthesia Preprocedure Evaluation (Addendum)
Anesthesia Evaluation  Patient identified by MRN, date of birth, ID band Patient awake    Reviewed: Allergy & Precautions, H&P , NPO status , Patient's Chart, lab work & pertinent test results, reviewed documented beta blocker date and time   Airway Mallampati: I  TM Distance: >3 FB Neck ROM: full    Dental no notable dental hx.    Pulmonary asthma , Current Smoker,    Pulmonary exam normal breath sounds clear to auscultation       Cardiovascular Exercise Tolerance: Good negative cardio ROS Normal cardiovascular exam Rhythm:regular Rate:Normal     Neuro/Psych Anxiety Bipolar Disorder negative neurological ROS  negative psych ROS   GI/Hepatic Neg liver ROS, PUD, GERD  Medicated,Ulcerative colitis    Endo/Other  negative endocrine ROS  Renal/GU negative Renal ROS  negative genitourinary   Musculoskeletal   Abdominal   Peds  Hematology negative hematology ROS (+)   Anesthesia Other Findings   Reproductive/Obstetrics negative OB ROS                            Anesthesia Physical Anesthesia Plan  ASA: II  Anesthesia Plan: Bier Block and Bier Block-Lidocaine Only   Post-op Pain Management:    Induction:   PONV Risk Score and Plan: Ondansetron  Airway Management Planned:   Additional Equipment:   Intra-op Plan:   Post-operative Plan:   Informed Consent: I have reviewed the patients History and Physical, chart, labs and discussed the procedure including the risks, benefits and alternatives for the proposed anesthesia with the patient or authorized representative who has indicated his/her understanding and acceptance.   Dental Advisory Given  Plan Discussed with: CRNA and Anesthesiologist  Anesthesia Plan Comments:        Anesthesia Quick Evaluation

## 2018-04-03 NOTE — Anesthesia Postprocedure Evaluation (Signed)
Anesthesia Post Note  Patient: Victoria Pierce  Procedure(s) Performed: LEFT HAND FIFTH METACARPAL PIN REMOVAL (Left Hand)  Patient location during evaluation: PACU Anesthesia Type: Bier Block Level of consciousness: awake and alert Pain management: pain level controlled Vital Signs Assessment: post-procedure vital signs reviewed and stable Respiratory status: spontaneous breathing, nonlabored ventilation, respiratory function stable and patient connected to nasal cannula oxygen Cardiovascular status: blood pressure returned to baseline and stable Postop Assessment: no apparent nausea or vomiting Anesthetic complications: no    Trecia Rogers

## 2018-04-03 NOTE — H&P (Signed)
Paper H&P to be scanned into permanent record. H&P reviewed and patient re-examined. No changes. 

## 2018-04-03 NOTE — Transfer of Care (Signed)
Immediate Anesthesia Transfer of Care Note  Patient: Victoria Pierce  Procedure(s) Performed: LEFT HAND FIFTH METACARPAL PIN REMOVAL (Left Hand)  Patient Location: PACU  Anesthesia Type: Bier Block  Level of Consciousness: awake, alert  and patient cooperative  Airway and Oxygen Therapy: Patient Spontanous Breathing and Patient connected to supplemental oxygen  Post-op Assessment: Post-op Vital signs reviewed, Patient's Cardiovascular Status Stable, Respiratory Function Stable, Patent Airway and No signs of Nausea or vomiting  Post-op Vital Signs: Reviewed and stable  Complications: No apparent anesthesia complications

## 2018-04-03 NOTE — Anesthesia Procedure Notes (Signed)
Date/Time: 04/03/2018 12:00 PM Performed by: Cameron Ali, CRNA Pre-anesthesia Checklist: Patient identified, Emergency Drugs available, Suction available, Timeout performed and Patient being monitored Patient Re-evaluated:Patient Re-evaluated prior to induction Oxygen Delivery Method: Nasal cannula Placement Confirmation: positive ETCO2

## 2018-04-03 NOTE — Discharge Instructions (Addendum)
General Anesthesia, Adult, Care After These instructions provide you with information about caring for yourself after your procedure. Your health care provider may also give you more specific instructions. Your treatment has been planned according to current medical practices, but problems sometimes occur. Call your health care provider if you have any problems or questions after your procedure. What can I expect after the procedure? After the procedure, it is common to have:  Vomiting.  A sore throat.  Mental slowness.  It is common to feel:  Nauseous.  Cold or shivery.  Sleepy.  Tired.  Sore or achy, even in parts of your body where you did not have surgery.  Follow these instructions at home: For at least 24 hours after the procedure:  Do not: ? Participate in activities where you could fall or become injured. ? Drive. ? Use heavy machinery. ? Drink alcohol. ? Take sleeping pills or medicines that cause drowsiness. ? Make important decisions or sign legal documents. ? Take care of children on your own.  Rest. Eating and drinking  If you vomit, drink water, juice, or soup when you can drink without vomiting.  Drink enough fluid to keep your urine clear or pale yellow.  Make sure you have little or no nausea before eating solid foods.  Follow the diet recommended by your health care provider. General instructions  Have a responsible adult stay with you until you are awake and alert.  Return to your normal activities as told by your health care provider. Ask your health care provider what activities are safe for you.  Take over-the-counter and prescription medicines only as told by your health care provider.  If you smoke, do not smoke without supervision.  Keep all follow-up visits as told by your health care provider. This is important. Contact a health care provider if:  You continue to have nausea or vomiting at home, and medicines are not helpful.  You  cannot drink fluids or start eating again.  You cannot urinate after 8-12 hours.  You develop a skin rash.  You have fever.  You have increasing redness at the site of your procedure. Get help right away if:  You have difficulty breathing.  You have chest pain.  You have unexpected bleeding.  You feel that you are having a life-threatening or urgent problem. This information is not intended to replace advice given to you by your health care provider. Make sure you discuss any questions you have with your health care provider. Document Released: 07/10/2000 Document Revised: 09/06/2015 Document Reviewed: 03/18/2015 Elsevier Interactive Patient Education  2018 [place] discharge instructions: Keep dressing dry and intact. Keep hand elevated above heart level. May shower after dressing removed on postop day 4 (Sunday). Cover sutures with Band-Aids after drying off. Apply ice to affected area frequently. Take Mobic 15 mg daily OR ibuprofen 600-800 mg TID with meals for 7-10 days, then as necessary. Take ES Tylenol or pain medication as prescribed when needed.  Return for follow-up in 10-14 days or as scheduled.

## 2018-04-03 NOTE — Op Note (Signed)
04/03/2018  12:36 PM  Patient:   Victoria Pierce  Pre-Op Diagnosis:   Retained pins status post closed reduction and percutaneous pinning of displaced left fifth metacarpal neck fracture.  Post-Op Diagnosis:   Same.  Procedure:   Removal of retained pins status post closed reduction and percutaneous pinning of displaced left fifth metacarpal neck fracture  Surgeon:   Pascal Lux, MD  Anesthesia:   Bier block  Findings:   As above.  Complications:   None  EBL:   1 cc  Fluids:   500 cc crystalloid  TT:   28 minutes at 250 mmHg  Drains:   None  Closure:   4-0 Prolene interrupted sutures  Brief Clinical Note:   The patient is a 37 year old female who is now 4 weeks status post a closed reduction and percutaneous pinning of a left fifth metacarpal neck fracture. X-rays demonstrate satisfactory healing of the fracture. The patient presents at this time for removal of the retained pins.   Procedure:   The patient was brought into the operating room and lain in the supine position. After adequate IV sedation was achieved, a timeout was performed to verify the appropriate surgical site before a Bier block was placed by the anesthesiologist and the tourniquet inflated to 250 mmHg. The left hand and upper extremity were prepped with ChloraPrep solution before being draped sterilely. Preoperative antibiotics were administered. Ortho scan imaging was performed in AP and lateral projections to be sure that the fracture was healed. Once this was verified, each pin was removed through a separate stab incision after palpating the pin beneath the skin. Repeat imaging with the OrthoScan device demonstrated excellent maintenance of fracture alignment in both the AP and lateral projections.  The wounds were irrigated thoroughly with sterile saline solution before being closed using 4-0 Prolene interrupted sutures. A total of 6 cc of 0.5% plain Sensorcaine was injected in and around the incision  before a sterile bulky dressing was applied to the left hand.  In addition, the left ring and little fingers were buddy taped for added protection. The patient was then awakened and returned to the recovery room in satisfactory condition after tolerating the procedure well.

## 2018-04-04 ENCOUNTER — Encounter: Payer: Self-pay | Admitting: Surgery

## 2018-04-04 ENCOUNTER — Ambulatory Visit: Payer: Medicaid Other | Admitting: Physical Therapy

## 2018-04-04 DIAGNOSIS — M542 Cervicalgia: Secondary | ICD-10-CM

## 2018-04-04 NOTE — Therapy (Signed)
[place] [facility] [address] [place], [address] Phone: [phone]   Fax:  [phone]  Physical Therapy Treatment  Patient Details  Name: MIRIANA GAERTNER MRN: [id number] Date of Birth: Mar 04, 1981 Referring Provider (PT): Tomasa Hose   Encounter Date: 04/04/2018  PT End of Session - 04/04/18 1745    Visit Number  2    Number of Visits  8    Date for PT Re-Evaluation  04/16/18    Authorization Type  medicaid    PT Start Time  0515    PT Stop Time  0553    PT Time Calculation (min)  38 min    Activity Tolerance  Patient tolerated treatment well    Behavior During Therapy  [facility] for tasks assessed/performed       Past Medical History:  Diagnosis Date  . Anxiety   . Asthma   . Bipolar 1 disorder ([place])   . GERD (gastroesophageal reflux disease)   . Ulcerative colitis   . Vaginal septum     Past Surgical History:  Procedure Laterality Date  . CLOSED REDUCTION METACARPAL WITH PERCUTANEOUS PINNING Left 03/07/2018   Procedure: CLOSED REDUCTION METACARPAL WITH PERCUTANEOUS PINNING-5th metacarpal;  Surgeon: Corky Mull, MD;  Location: [facility];  Service: Orthopedics;  Laterality: Left;  . HARDWARE REMOVAL Left 04/03/2018   Procedure: LEFT HAND FIFTH METACARPAL PIN REMOVAL;  Surgeon: Corky Mull, MD;  Location: [place];  Service: Orthopedics;  Laterality: Left;  . TUBAL LIGATION      There were no vitals filed for this visit.  Subjective Assessment - 04/04/18 1743    Subjective  Patient reports she has increased pain today following not having pain medication refilled today. Patient reports increased pain with neck/shoulders d/t wrist pain (she is having to modify activity for). Reports 7-8 pain on arrival.     Pertinent History  Patient is 37 yo female that complains of chronic neck pain that started a couple of years ago. States her pain is getting worse overtime.  States it feels like she  has "bubbles" and neck tension. Complains of difficulty moving her neck, L and R pain. Reports that it feels like it always needs to "pop". Difficulty with posture, Driving, bending over, difficulty sleeping (going to sleep). Patient reports that she often cracks her neck/mid back, and that her boyfriend helps, ~4x a day. Has seen a chiropractor in the past. PMH includes GERD, bipolar, depression/axniety, ulcerative colitis, smoker.    Limitations  House hold activities;Reading;Other (comment)    How long can you sit comfortably?  NA    How long can you stand comfortably?  NA    How long can you walk comfortably?  NA    Diagnostic tests  xray    Patient Stated Goals  pain relief    Pain Onset  More than a month ago          Manual - STM withtrigger point releaseto bilat levator, and cervical paraspinals. Following:Dry Needling (2/2) 88m .30needles placed along thebilat UT and levator (with plantar grasp)to decrease increased muscular spasms and trigger points with the patient positioned in supine. Patient was educated on risks and benefits of therapy and verbally consents to PT. - C0-C2 UPA grade I-II 30sec bouts 4 bouts each side for pain modulation; inc to grade III 6 bouts each side to inc rotation ROM - C7-T1 UPA grade I-II 30sec bouts 4 bouts each side for pain modulation;  inc to grade III-IV 6 bouts each side to inc ext ROM d/t posture; incidentals cavitation with grade IV mob to CTJ  - Cervical traction 10sec traction/10sec relax x6   ESTIM+ heat packHiVolt ESTIM38mn at patient tolerated65Vincreased to70Vincreased to 80Vthrough treatmentatbilat levator and UT.Attemptedd/t success from prior treatment. With PT assessing patient tolerance throughout (increasing intensity as needed), monitoring skin integrity (normal), with decreased pain noted from patientfollowing, and inc ROM following.Educated patient on                       PT Education -  04/04/18 1745    Education Details  Postural carry over, deep breathing exercise    Person(s) Educated  Patient    Methods  Explanation;Demonstration    Comprehension  Verbalized understanding;Returned demonstration       PT Short Term Goals - 03/05/18 1122      PT SHORT TERM GOAL #1   Title  Patient will be adherent to initial HEP at least 3x a week in prep for self management of condition.    Baseline  03/05/18: able to complete independently    Time  6    Period  Weeks    Status  Achieved        PT Long Term Goals - 03/05/18 1122      PT LONG TERM GOAL #1   Title  The patient will demonstrate at least 10 point improvement in FOTO score indicating an increased ease of performing functional tasks.    Baseline  03/05/18 65    Time  6    Period  Weeks    Status  Achieved      PT LONG TERM GOAL #2   Title  Patient will be independent in home exercise program to improve strength/mobility for better functional independence with ADLs.    Baseline  03/05/18 able to complete independently    Time  6    Period  Weeks    Status  Achieved      PT LONG TERM GOAL #3   Title   Patient will be able to perform household work/chores with pain of 3/10 or less  to improve patient's tolerance to functional tasks.    Baseline  03/05/18 pain ranges from 2/10-4/10    Time  6    Period  Weeks    Status  On-going      PT LONG TERM GOAL #4   Title  Patient will increase BUE gross strength to 4+/5 as to improve functional strength for increased ADL/IADL ability.    Baseline  03/05/18 R/L: shoulder flex 5/5 abd 5/5 IR 4+/4+ ER 4/4 ext 5/5    Time  6    Period  Weeks    Status  Partially Met            Plan - 04/04/18 1826    Clinical Impression Statement  Patient with increased pain this session following wrist surgery and being "off her pain medication". PT utilized increased manual techniques to decrease muscular tension, which patient responded well to with good localized twitch  response. Following manual + modality techniques patient reports only 4/10 pain and is very pleased. PT will continue pain management techniques as needed, with therex progression as able with other medical condition/complication.     Rehab Potential  Good    Clinical Impairments Affecting Rehab Potential  + motivation, decreased functional abilities, decreased sleep, decreased activity tolerance    PT Frequency  2x / week  PT Duration  6 weeks    PT Treatment/Interventions  ADLs/Self Care Home Management;Ultrasound;Neuromuscular re-education;Spinal Manipulations;Joint Manipulations;Passive range of motion;Patient/family education;Gait training;Canalith Repostioning;Cryotherapy;Dry needling;Energy conservation;Functional mobility training;Electrical Stimulation;Therapeutic activities;Iontophoresis 41m/ml Dexamethasone;Splinting;Taping;Vestibular;Manual techniques;Therapeutic exercise;Moist Heat;Traction    PT Next Visit Plan  STM, traction, postural strengthening, assess spine mobility    PT Home Exercise Plan  see patient instruction section    Consulted and Agree with Plan of Care  Patient       Patient will benefit from skilled therapeutic intervention in order to improve the following deficits and impairments:  Decreased endurance, Hypomobility, Decreased activity tolerance, Decreased strength, Increased fascial restricitons, Pain, Increased muscle spasms, Decreased mobility, Decreased range of motion, Postural dysfunction, Improper body mechanics  Visit Diagnosis: Cervicalgia     Problem List Patient Active Problem List   Diagnosis Date Noted  . Ulcerative colitis   . GERD (gastroesophageal reflux disease)   . Vaginal septum   . Anxiety    CShelton SilvasPT, DPT CShelton Silvas12/19/2019, 6:30 PM  [facility] [address] [place] [address]Phone: [phone]  Fax:  [phone] Name: KDAMIRA KEMMRN: [id number] of Birth: 4November 13, 1982

## 2018-04-12 ENCOUNTER — Ambulatory Visit: Payer: Medicaid Other | Admitting: Physical Therapy

## 2018-04-15 ENCOUNTER — Encounter: Payer: Self-pay | Admitting: Physical Therapy

## 2018-04-15 ENCOUNTER — Ambulatory Visit: Payer: Medicaid Other | Admitting: Physical Therapy

## 2018-04-15 DIAGNOSIS — M542 Cervicalgia: Secondary | ICD-10-CM | POA: Diagnosis not present

## 2018-04-15 NOTE — Therapy (Addendum)
[place] [facility] [address] [place], [address] Phone: [phone]   Fax:  [phone]  Physical Therapy Treatment  Patient Details  Name: Victoria Pierce MRN: [id number] Date of Birth: 1980/10/28 Referring Provider (PT): Tomasa Hose   Encounter Date: 04/15/2018  PT End of Session - 04/15/18 1133    Visit Number  3    Number of Visits  8    Date for PT Re-Evaluation  04/16/18    Authorization Type  medicaid    PT Start Time  1115    PT Stop Time  1200    PT Time Calculation (min)  45 min    Activity Tolerance  Patient tolerated treatment well    Behavior During Therapy  [facility] for tasks assessed/performed       Past Medical History:  Diagnosis Date  . Anxiety   . Asthma   . Bipolar 1 disorder ([place])   . GERD (gastroesophageal reflux disease)   . Ulcerative colitis   . Vaginal septum     Past Surgical History:  Procedure Laterality Date  . CLOSED REDUCTION METACARPAL WITH PERCUTANEOUS PINNING Left 03/07/2018   Procedure: CLOSED REDUCTION METACARPAL WITH PERCUTANEOUS PINNING-5th metacarpal;  Surgeon: Corky Mull, MD;  Location: [facility];  Service: Orthopedics;  Laterality: Left;  . HARDWARE REMOVAL Left 04/03/2018   Procedure: LEFT HAND FIFTH METACARPAL PIN REMOVAL;  Surgeon: Corky Mull, MD;  Location: [place];  Service: Orthopedics;  Laterality: Left;  . TUBAL LIGATION      There were no vitals filed for this visit.  Subjective Assessment - 04/15/18 1118    Subjective  Patient reports 3/10 pain today, reporting that she is having some "tension in the neck". Patient has the cast removed from L wrist and reports she has been able to comoplete more exercises since.     Pertinent History  Patient is 37 yo female that complains of chronic neck pain that started a couple of years ago. States her pain is getting worse overtime.  States it feels like she has "bubbles" and neck tension.  Complains of difficulty moving her neck, L and R pain. Reports that it feels like it always needs to "pop". Difficulty with posture, Driving, bending over, difficulty sleeping (going to sleep). Patient reports that she often cracks her neck/mid back, and that her boyfriend helps, ~4x a day. Has seen a chiropractor in the past. PMH includes GERD, bipolar, depression/axniety, ulcerative colitis, smoker.    Limitations  House hold activities;Reading;Other (comment)    How long can you sit comfortably?  NA    How long can you stand comfortably?  NA    How long can you walk comfortably?  NA    Diagnostic tests  xray    Patient Stated Goals  pain relief    Pain Onset  More than a month ago           Manual - STM withtrigger point releaseto bilat levator, and cervical paraspinals. Following:Dry Needling (2/2)21m .30needles placed along thebilatUT and L levator(with plantar grasp)to decrease increased muscular spasms and trigger points with the patient positioned in supine. Patient was educated on risks and benefits of therapy and verbally consents to PT. - Cervical traction 10sec traction/10sec relax x6  Ther-Ex - YTI 3x 10 each position BW with min TC for scap retraction with good carry over following - Seated rows with GTB (wrapped around wrist) 3x 10 with min cuing for posture  and scap retraction  ESTIM+ heat packHiVolt ESTIM40mn at patient tolerated110Vincreased [id number] rhomboid/levator.Attemptedd/t success from prior treatment. With PT assessing patient tolerance throughout (increasing intensity as needed), monitoring skin integrity (normal), with decreased pain noted from patientfollowing, and inc ROM following. Progressed HEP with patient during this time, and patient completed FOTO survey.                      PT Education - 04/15/18 1132    Education Details  Therex technique    Person(s) Educated  Patient    Methods  Explanation;Tactile  cues;Verbal cues    Comprehension  Verbalized understanding;Tactile cues required;Verbal cues required       PT Short Term Goals - 03/05/18 1122      PT SHORT TERM GOAL #1   Title  Patient will be adherent to initial HEP at least 3x a week in prep for self management of condition.    Baseline  03/05/18: able to complete independently    Time  6    Period  Weeks    Status  Achieved        PT Long Term Goals - 04/15/18 1120      PT LONG TERM GOAL #1   Title  The patient will demonstrate at least 10 point improvement in FOTO score indicating an increased ease of performing functional tasks.    Baseline  04/15/18 59    Time  6    Period  Weeks    Status  On-going      PT LONG TERM GOAL #2   Title  Patient will be independent in home exercise program to improve strength/mobility for better functional independence with ADLs.    Baseline  04/15/18 Able to complete again now that wrist is doing better    Time  6    Period  Weeks    Status  On-going      PT LONG TERM GOAL #3   Title   Patient will be able to perform household work/chores with pain of 3/10 or less  to improve patient's tolerance to functional tasks.    Baseline  04/15/18 Pain 5/10 with household chores    Time  6    Period  Weeks    Status  On-going      PT LONG TERM GOAL #4   Title  Patient will increase BUE gross strength to 4+/5 as to improve functional strength for increased ADL/IADL ability.    Baseline  12/13019 R/L: shoulder flex 5/5 abd 5/5 IR 5/5 ER 5/5 ext 5/5    Status  Achieved            Plan - 04/15/18 1141    Clinical Impression Statement  Patient has made slow progress, delayed d/t other medical complications with excess pain and precautions of L wrist. Patient has made gains in strengthening, with some remaining difficulty activating periscapular musculature (which she is able to do following cuing), and pain management (with increased pain from guarding casted wrist). Patient will continue  to benefit from skilled PT over the next 4 weeks (1x/week) to ensure periscapular strengthening/activation for postural carry over to prevent pain following PT sessions, and for pain management.     Rehab Potential  Good    Clinical Impairments Affecting Rehab Potential  + motivation, decreased functional abilities, decreased sleep, decreased activity tolerance    PT Frequency  2x / week    PT Treatment/Interventions  ADLs/Self Care Home Management;Ultrasound;Neuromuscular re-education;Spinal Manipulations;Joint  Manipulations;Passive range of motion;Patient/family education;Gait training;Canalith Repostioning;Cryotherapy;Dry needling;Energy conservation;Functional mobility training;Electrical Stimulation;Therapeutic activities;Iontophoresis 56m/ml Dexamethasone;Splinting;Taping;Vestibular;Manual techniques;Therapeutic exercise;Moist Heat;Traction    PT Next Visit Plan  STM, traction, postural strengthening, assess spine mobility    PT Home Exercise Plan  YTI, rows BTB, UT stretch, pec doorway stretch    Consulted and Agree with Plan of Care  Patient       Patient will benefit from skilled therapeutic intervention in order to improve the following deficits and impairments:  Decreased endurance, Hypomobility, Decreased activity tolerance, Decreased strength, Increased fascial restricitons, Pain, Increased muscle spasms, Decreased mobility, Decreased range of motion, Postural dysfunction, Improper body mechanics  Visit Diagnosis: Cervicalgia     Problem List Patient Active Problem List   Diagnosis Date Noted  . Ulcerative colitis   . GERD (gastroesophageal reflux disease)   . Vaginal septum   . Anxiety    CShelton SilvasPT, DPT CShelton Silvas12/30/2019, 11:54 AM  [facility] [address] [place] [address]Phone: [phone]  Fax:  [phone] Name: KALYCIA COOPERWOODMRN: [id number] of Birth:  409-Jan-1982

## 2018-04-23 ENCOUNTER — Ambulatory Visit: Payer: Medicaid Other | Admitting: Physical Therapy

## 2018-04-25 ENCOUNTER — Ambulatory Visit: Payer: Medicaid Other | Admitting: Occupational Therapy

## 2018-04-30 ENCOUNTER — Ambulatory Visit: Payer: Medicaid Other | Admitting: Physical Therapy

## 2018-04-30 ENCOUNTER — Other Ambulatory Visit: Payer: Self-pay

## 2018-04-30 ENCOUNTER — Ambulatory Visit: Payer: Medicaid Other | Attending: Surgery | Admitting: Occupational Therapy

## 2018-04-30 ENCOUNTER — Encounter: Payer: Self-pay | Admitting: Occupational Therapy

## 2018-04-30 DIAGNOSIS — M79642 Pain in left hand: Secondary | ICD-10-CM

## 2018-04-30 DIAGNOSIS — M25642 Stiffness of left hand, not elsewhere classified: Secondary | ICD-10-CM

## 2018-04-30 DIAGNOSIS — M542 Cervicalgia: Secondary | ICD-10-CM | POA: Diagnosis present

## 2018-04-30 DIAGNOSIS — M6281 Muscle weakness (generalized): Secondary | ICD-10-CM | POA: Insufficient documentation

## 2018-04-30 DIAGNOSIS — R600 Localized edema: Secondary | ICD-10-CM | POA: Diagnosis present

## 2018-04-30 NOTE — Patient Instructions (Signed)
Contrast PIP extention 4th and 5th  Block flexion of PIP and DIP 10 reps MC flexion AAROM and extention Blocked intrinsic fist -10 reps And AROM to 4 cm foamblock - 10 reps  Wear night time silicon sleeve compression  And if tolerating during day  buddy strap on for 2 hrs at time - during light house hold act - sweeping , laundry Driving , cutting,  Keep pain under 2/10 and slight pull

## 2018-04-30 NOTE — Therapy (Signed)
[place] [facility] [address], [place], [address] Phone: [phone]   Fax:  [phone]  Occupational Therapy Evaluation  Patient Details  Name: Victoria Pierce MRN: [id number] Date of Birth: 1980/07/18 Referring Provider (OT): poggi   Encounter Date: 04/30/2018  OT End of Session - 04/30/18 1016    Visit Number  1    Number of Visits  8    Date for OT Re-Evaluation  05/28/18    OT Start Time  0918    OT Stop Time  1013    OT Time Calculation (min)  55 min    Activity Tolerance  Patient tolerated treatment well    Behavior During Therapy  [facility] for tasks assessed/performed       Past Medical History:  Diagnosis Date  . Anxiety   . Asthma   . Bipolar 1 disorder ([place])   . GERD (gastroesophageal reflux disease)   . Ulcerative colitis   . Vaginal septum     Past Surgical History:  Procedure Laterality Date  . CLOSED REDUCTION METACARPAL WITH PERCUTANEOUS PINNING Left 03/07/2018   Procedure: CLOSED REDUCTION METACARPAL WITH PERCUTANEOUS PINNING-5th metacarpal;  Surgeon: Corky Mull, MD;  Location: [facility];  Service: Orthopedics;  Laterality: Left;  . HARDWARE REMOVAL Left 04/03/2018   Procedure: LEFT HAND FIFTH METACARPAL PIN REMOVAL;  Surgeon: Corky Mull, MD;  Location: [place];  Service: Orthopedics;  Laterality: Left;  . TUBAL LIGATION      There were no vitals filed for this visit.  Subjective Assessment - 04/30/18 0923    Currently in Pain?  Yes    Pain Score  4     Pain Location  Hand    Pain Orientation  Left    Pain Descriptors / Indicators  Tightness;Numbness    Pain Type  Surgical pain    Pain Onset  More than a month ago    Pain Frequency  Intermittent    Aggravating Factors   with ROM         [facility] OT Assessment - 04/30/18 0001      Assessment   Medical Diagnosis  L 5th MC neck fx with pinning     Referring Provider (OT)  [email or web address]    Onset Date/Surgical Date   03/07/18    Hand Dominance  Right      Home  Environment   Lives With  Daughter   boyfriend and daughter     Prior Function   Vocation  On disability    Leisure  house work, pets, phone and tablet       Sensation   Additional Comments  --   5th increase 1 cm prox/PIP ; 4th 1cm prox..6 PIP     Left Hand AROM   L Ring  MCP 0-90  55 Degrees    L Ring PIP 0-100  75 Degrees   -15   L Ring DIP 0-70  50 Degrees    L Little  MCP 0-90  30 Degrees    L Little PIP 0-100  60 Degrees   -35   L Little DIP 0-70  40 Degrees       Fluidotherapy done prior to review of HEP - with AROM to increase ROM and decrease pain and edema - increase ROM   Contrast PIP extention 4th and 5th  Block flexion of PIP and DIP 10 reps MC flexion AAROM and extention Blocked intrinsic fist -10 reps And  AROM to 4 cm foamblock - 10 reps  Wear night time silicon sleeve compression  And if tolerating during day  buddy strap on for 2 hrs at time - during light house hold act - sweeping , laundry Driving , cutting,  Keep pain under 2/10 and slight pull                OT Education - 04/30/18 1015    Education Details  findings and HEP     Person(s) Educated  Patient    Methods  Demonstration;Verbal cues;Handout    Comprehension  Returned demonstration       OT Short Term Goals - 04/30/18 1121      OT SHORT TERM GOAL #1   Title  Pt to be ind in HEP to decrease edema, pain and increase AROM in 4th ad 5th digit     Baseline  no knowledge     Time  2    Period  Weeks    Status  New    Target Date  05/14/18      OT SHORT TERM GOAL #2   Title  AROM in L 4thand 5th digit improve for pt to grip 2 cm cylinder - brush, knife     Baseline  4th MC55, PIP 75; 5th MC 30 and PIP 60 - cannot touch 4 cm foam block     Time  3    Period  Weeks    Status  New    Target Date  05/21/18        OT Long Term Goals - 04/30/18 1132      OT LONG TERM GOAL #1   Title  Pain on PRWHE improve with more  than 12 points     Baseline  pain score on PRWHE 19/50 -     Time  4    Period  Weeks    Status  New    Target Date  05/28/18      OT LONG TERM GOAL #2   Title  AROM in 4thand 5th improve for pt to touch palm to improve functional use score on PRWHE by 15 points     Baseline  See flowsheet- and PRHWE function score 26/50    Time  4    Period  Weeks    Status  New    Target Date  05/28/18      OT LONG TERM GOAL #3   Title  Grip strength in L improve to more than 50% to carry more than 5 lbs, cut with knife ,and squeeze washcloth     Baseline  Grip R 75 lbs , L 20 lbs - pain more than 5/10 - edema more than 1 cm in proximal phalanges 4th and 5th     Time  4    Period  Weeks    Status  New    Target Date  05/28/18            Plan - 04/30/18 1016    Clinical Impression Statement  Pt present at OT eval 7 1/2 wks s/p pinning of neck fx L 5th MC- pt show increase edema, pain and decrease AROM /PROM and strength - all limiting her functional use - pt report numbness on ulnar side of 5th  PIP and MC - pt can benefit from OT services     Occupational performance deficits (Please refer to evaluation for details):  ADL's;IADL's;Leisure;Play    Rehab Potential  Good  OT Frequency  2x / week    OT Duration  4 weeks    OT Treatment/Interventions  Self-care/ADL training;Paraffin;Therapeutic exercise;Scar mobilization;Manual Therapy;Fluidtherapy;Contrast Bath;Passive range of motion;Patient/family education    Plan  assess progress with HEP and modify as needed      Clinical Decision Making  Several treatment options, min-mod task modification necessary    OT Home Exercise Plan  see pt instruction     Consulted and Agree with Plan of Care  Patient       Patient will benefit from skilled therapeutic intervention in order to improve the following deficits and impairments:  Impaired sensation, Decreased range of motion, Decreased scar mobility, Increased edema, Pain, Impaired UE functional  use, Impaired flexibility, Decreased strength  Visit Diagnosis: Stiffness of left hand, not elsewhere classified - Plan: Ot plan of care cert/re-cert  Pain in left hand - Plan: Ot plan of care cert/re-cert  Localized edema - Plan: Ot plan of care cert/re-cert  Muscle weakness (generalized) - Plan: Ot plan of care cert/re-cert    Problem List Patient Active Problem List   Diagnosis Date Noted  . Ulcerative colitis   . GERD (gastroesophageal reflux disease)   . Vaginal septum   . Anxiety     Rosalyn Gess OTR/L,CLT 04/30/2018, 4:01 PM  [place] [facility] [address], [place], [address] Phone: [phone]   Fax:  [phone]  Name: Victoria Pierce MRN: [id number] Date of Birth: [birth year]

## 2018-05-03 ENCOUNTER — Ambulatory Visit: Payer: Medicaid Other | Admitting: Gastroenterology

## 2018-05-03 ENCOUNTER — Encounter: Payer: Self-pay | Admitting: Gastroenterology

## 2018-05-03 ENCOUNTER — Other Ambulatory Visit: Payer: Self-pay

## 2018-05-03 VITALS — BP 113/78 | HR 101 | Resp 18 | Ht 63.0 in | Wt 182.4 lb

## 2018-05-03 DIAGNOSIS — K625 Hemorrhage of anus and rectum: Secondary | ICD-10-CM

## 2018-05-03 DIAGNOSIS — R140 Abdominal distension (gaseous): Secondary | ICD-10-CM

## 2018-05-03 DIAGNOSIS — R0789 Other chest pain: Secondary | ICD-10-CM

## 2018-05-03 DIAGNOSIS — R109 Unspecified abdominal pain: Secondary | ICD-10-CM

## 2018-05-03 DIAGNOSIS — R072 Precordial pain: Secondary | ICD-10-CM

## 2018-05-03 MED ORDER — PANTOPRAZOLE SODIUM 40 MG PO TBEC: 40.0000 mg | DELAYED_RELEASE_TABLET | Freq: Two times a day (BID) | ORAL | 1 refills | Status: DC

## 2018-05-03 MED ORDER — DICYCLOMINE HCL 10 MG PO CAPS: 10.0000 mg | ORAL_CAPSULE | Freq: Three times a day (TID) | ORAL | 1 refills | Status: DC

## 2018-05-03 NOTE — Progress Notes (Signed)
Cephas Darby, MD [address]  [place]  [place],  [address]  Main: [phone]  Fax: [phone]    Gastroenterology Consultation  Referring Provider:     [name], MD Primary Care Physician:  [name], MD Primary Gastroenterologist:  Dr. Cephas Darby Reason for Consultation:     Rectal bleeding, lower abdominal cramps, atypical chest pain        HPI:   Victoria Pierce is a 38 y.o. female referred by Dr. [name], MD  for consultation & management of chronic lower abdominal pain, rectal bleeding and atypical chest pain Patient reports that she has been experiencing chronic left lower quadrant pain associated with bloating, intermittent rectal bleeding.  She has been experiencing atypical chest pain and occasional heartburn, globus sensation.  This has been going on for several years.  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  Recently cut down the consumption about 2 months ago.  Due to these symptoms, in 2010 she underwent EGD and colonoscopy at [facility], was told she has GERD and UC.  However, is not on any medications for ulcerative colitis She had ER visits for lower abdominal pain.  Underwent CT and was found to have diverticulosis. Atypical chest pain: She is now taking protonix 40 mg once a day which provides some relief.  She does experience intermittent heartburn but chest pain is more frequent compared to heartburn.  She does have globus sensation as well.  She tried Zantac and omeprazole which didn't help  Had rectal bleeding, treated empirically for diverticulitis about 2-3 months ago.  Patient reports that her stools are soft, goes up to 2 times daily Patient reports that she has been going through stress, situational stress, family stress, she has history of bipolar currently being treated for it.  Follow-up visit 05/03/2018 Patient stopped taking Goody  powder.  She reports that her GI symptoms are significantly improved on Bentyl 2 times daily.  Protonix twice daily also provide significant relief with chest tightness.  She canceled her EGD and colonoscopy as she was on significant pain medication for fractured finger and she could not tolerate the prep due to nausea secondary to opioid pain medication.  She is here to discuss about GI procedures and also refills on her medication.  Patient lost about 15 pounds intentionally since last visit.  Overall, she is in good spirits today  NSAIDs: Long history of goody powder 10-15 packets/ay for last 10-14years, cut down to 1 a day since 11/2017  Antiplts/Anticoagulants/Anti thrombotics: none  GI Procedures: EGD and colonoscopy in 2010 at [facility] Reports not available She denies family history of GI malignancy  Past Medical History:  Diagnosis Date  . Anxiety   . Asthma   . Bipolar 1 disorder ([place])   . GERD (gastroesophageal reflux disease)   . Ulcerative colitis   . Vaginal septum     Past Surgical History:  Procedure Laterality Date  . CLOSED REDUCTION METACARPAL WITH PERCUTANEOUS PINNING Left 03/07/2018   Procedure: CLOSED REDUCTION METACARPAL WITH PERCUTANEOUS PINNING-5th metacarpal;  Surgeon: Corky Mull, MD;  Location: [facility];  Service: Orthopedics;  Laterality: Left;  . HARDWARE REMOVAL Left 04/03/2018   Procedure: LEFT HAND FIFTH METACARPAL PIN REMOVAL;  Surgeon: Corky Mull, MD;  Location: [place];  Service: Orthopedics;  Laterality: Left;  . TUBAL LIGATION  Current Outpatient Medications:  .  busPIRone (BUSPAR) 5 MG tablet, Take 5 mg by mouth 2 (two) times daily., Disp: , Rfl:  .  cyclobenzaprine (FLEXERIL) 10 MG tablet, Take 10 mg by mouth 3 (three) times daily as needed for muscle spasms. , Disp: , Rfl:  .  gabapentin (NEURONTIN) 300 MG capsule, Take 900 mg by mouth 2 (two) times daily. , Disp: , Rfl:  .  guanFACINE (INTUNIV) 1 MG TB24 ER tablet, Take 1 mg by  mouth daily., Disp: , Rfl:  .  hydrOXYzine (ATARAX/VISTARIL) 50 MG tablet, Take 50 mg by mouth 2 (two) times daily as needed for anxiety. , Disp: , Rfl:  .  lamoTRIgine (LAMICTAL) 100 MG tablet, Take 100 mg by mouth 2 (two) times daily., Disp: , Rfl:  .  lurasidone (LATUDA) 40 MG TABS tablet, Take 40 mg by mouth daily with breakfast., Disp: , Rfl:  .  Melatonin 5 MG CAPS, Take 5 mg by mouth at bedtime., Disp: , Rfl:  .  Multiple Vitamin (MULTIVITAMIN WITH MINERALS) TABS tablet, Take 1 tablet by mouth daily., Disp: , Rfl:  .  promethazine (PHENERGAN) 25 MG tablet, Take 25 mg by mouth every 6 (six) hours as needed for nausea or vomiting., Disp: , Rfl:  .  topiramate (TOPAMAX) 50 MG tablet, Take 50 mg by mouth 2 (two) times daily., Disp: , Rfl:  .  traZODone (DESYREL) 100 MG tablet, Take 100 mg by mouth at bedtime., Disp: , Rfl:  .  albuterol (PROVENTIL HFA;VENTOLIN HFA) 108 (90 BASE) MCG/ACT inhaler, Inhale 2 puffs into the lungs every 4 (four) hours as needed for wheezing., Disp: 1 Inhaler, Rfl: 0 .  dicyclomine (BENTYL) 10 MG capsule, Take 1 capsule (10 mg total) by mouth 3 (three) times daily before meals., Disp: 270 capsule, Rfl: 1 .  HYDROcodone-acetaminophen (NORCO/VICODIN) 5-325 MG tablet, Take 1-2 tablets by mouth every 4 (four) hours as needed for moderate pain. (Patient not taking: Reported on 05/03/2018), Disp: 30 tablet, Rfl: 0 .  meloxicam (MOBIC) 15 MG tablet, Take 1 tablet (15 mg total) by mouth daily. (Patient not taking: Reported on 05/03/2018), Disp: 30 tablet, Rfl: 0 .  pantoprazole (PROTONIX) 40 MG tablet, Take 1 tablet (40 mg total) by mouth 2 (two) times daily before a meal., Disp: 180 tablet, Rfl: 1  No family history on file.   Social History   Tobacco Use  . Smoking status: Current Every Day Smoker    Packs/day: 1.00    Years: 27.00    Pack years: 27.00    Types: Cigarettes  . Smokeless tobacco: Never Used  Substance Use Topics  . Alcohol use: No  . Drug use: Yes     Types: Marijuana    Allergies as of 05/03/2018  . (No Known Allergies)    Review of Systems:    All systems reviewed and negative except where noted in HPI.   Physical Exam:  BP 113/78 (BP Location: Left Arm, Patient Position: Sitting, Cuff Size: Large)   Pulse (!) 101   Resp 18   Ht 5' 3"  (1.6 m)   Wt 182 lb 6.4 oz (82.7 kg)   BMI 32.31 kg/m  No LMP recorded.  General:   Alert,  Well-developed, well-nourished, pleasant and cooperative in NAD Head:  Normocephalic and atraumatic. Eyes:  Sclera clear, no icterus.   Conjunctiva pink. Ears:  Normal auditory acuity. Nose:  No deformity, discharge, or lesions. Mouth:  No deformity or lesions,oropharynx pink & moist. Neck:  Supple; no masses or thyromegaly. Lungs:  Respirations even and unlabored.  Clear throughout to auscultation.   No wheezes, crackles, or rhonchi. No acute distress. Heart:  Regular rate and rhythm; no murmurs, clicks, rubs, or gallops. Abdomen:  Normal bowel sounds. Soft, epigastric tenderness and non-distended without masses, hepatosplenomegaly or hernias noted.  No guarding or rebound tenderness.   Rectal: Not performed Msk:  Symmetrical without gross deformities. Good, equal movement & strength bilaterally. Pulses:  Normal pulses noted. Extremities:  No clubbing or edema.  No cyanosis. Neurologic:  Alert and oriented x3;  grossly normal neurologically. Skin:  Intact without significant lesions or rashes. No jaundice. Lymph Nodes:  No significant cervical adenopathy. Psych:  Alert and cooperative. Normal mood and affect.  Imaging Studies: Reviewed  Assessment and Plan:   Victoria Pierce is a 38 y.o. Caucasian female with history of bipolar, chronic lower abdominal pain, bloating, intermittent rectal bleeding, atypical chest pain and heartburn  Atypical chest pain and heartburn Continue Protonix 40 mg twice daily EGD with esophageal and gastric biopsies  Chronic lower abdominal pain associated with  bloating and intermittent rectal bleeding: Probably IBS Continue Bentyl 2-3 times daily, refills provided Recommend colonoscopy for further evaluation as she is given a diagnosis of ulcerative colitis  I have discussed alternative options, risks & benefits,  which include, but are not limited to, bleeding, infection, perforation,respiratory complication & drug reaction.  The patient agrees with this plan & written consent will be obtained.    Follow up in 3 months   Cephas Darby, MD

## 2018-05-07 ENCOUNTER — Ambulatory Visit: Payer: Medicaid Other | Admitting: Occupational Therapy

## 2018-05-07 DIAGNOSIS — M25642 Stiffness of left hand, not elsewhere classified: Secondary | ICD-10-CM | POA: Diagnosis not present

## 2018-05-07 DIAGNOSIS — R600 Localized edema: Secondary | ICD-10-CM

## 2018-05-07 DIAGNOSIS — M79642 Pain in left hand: Secondary | ICD-10-CM

## 2018-05-07 DIAGNOSIS — M6281 Muscle weakness (generalized): Secondary | ICD-10-CM

## 2018-05-07 NOTE — Patient Instructions (Signed)
Add prolonged stretch in heat with wrapping pinkie with coban  And composite flexion of 5th -  5 x day  Cont other HEP

## 2018-05-07 NOTE — Therapy (Signed)
[place] [facility] [address] [place], [address] Phone: [phone]   Fax:  [phone]  Occupational Therapy Treatment  Patient Details  Name: Victoria Pierce MRN: [id number] Date of Birth: 06-13-1980 Referring Provider (OT): poggi   Encounter Date: 05/07/2018  OT End of Session - 05/07/18 6378    Visit Number  2    Number of Visits  8    Date for OT Re-Evaluation  05/28/18    Authorization - Visit Number  1    Authorization - Number of Visits  3    OT Start Time  1218    OT Stop Time  1300    OT Time Calculation (min)  42 min    Activity Tolerance  Patient tolerated treatment well    Behavior During Therapy  [facility] for tasks assessed/performed       Past Medical History:  Diagnosis Date  . Anxiety   . Asthma   . Bipolar 1 disorder ([place])   . GERD (gastroesophageal reflux disease)   . Ulcerative colitis   . Vaginal septum     Past Surgical History:  Procedure Laterality Date  . CLOSED REDUCTION METACARPAL WITH PERCUTANEOUS PINNING Left 03/07/2018   Procedure: CLOSED REDUCTION METACARPAL WITH PERCUTANEOUS PINNING-5th metacarpal;  Surgeon: Corky Mull, MD;  Location: [facility];  Service: Orthopedics;  Laterality: Left;  . HARDWARE REMOVAL Left 04/03/2018   Procedure: LEFT HAND FIFTH METACARPAL PIN REMOVAL;  Surgeon: Corky Mull, MD;  Location: [place];  Service: Orthopedics;  Laterality: Left;  . TUBAL LIGATION      There were no vitals filed for this visit.  Subjective Assessment - 05/07/18 1629    Subjective   Look, I think I can make fist better - did use the buddystrap and then the compression sleeves - make it little tight when trying to make fist - pain with stretches    Patient Stated Goals  To get my motion and strength back in my L hand - and bend my pinkie     Currently in Pain?  Yes    Pain Score  3     Pain Location  Finger (Comment which one)    Pain Orientation  Left     Pain Descriptors / Indicators  Tightness;Numbness    Pain Type  Surgical pain    Pain Onset  More than a month ago    Pain Frequency  Intermittent         [facility] OT Assessment - 05/07/18 0001      Left Hand AROM   L Ring  MCP 0-90  60 Degrees    L Ring PIP 0-100  80 Degrees   -10   L Little  MCP 0-90  30 Degrees    L Little PIP 0-100  75 Degrees   -5     measurements taken for AROM of digits  Pt made good progress but 5th MC flexion still the same         OT Treatments/Exercises (OP) - 05/07/18 0001      LUE Paraffin   Number Minutes Paraffin  10 Minutes    LUE Paraffin Location  Hand    Comments  flexion wrap done to 5th digit to increase flexion of 5th MC       Soft tissue mobs done to volar 5th and 4th - using graston tool nr 2 for brushing -  PROM for DIP , PIP and  MC of 4th and 5th  Composite flexion of 5th and 4th done and add to HEP   PROM after paraffin at 5th [facility] was 45 degrees with increase pain but less than prior  Pt to focus on 5th MC flexion  Issued new compression socks and buddy strap - change buddy strap to proximal phalanges to increase MC flexion - at 4th        OT Education - 05/07/18 1632    Education Details  HEP updates    Person(s) Educated  Patient    Methods  Demonstration;Verbal cues;Handout    Comprehension  Returned demonstration       OT Short Term Goals - 04/30/18 1121      OT SHORT TERM GOAL #1   Title  Pt to be ind in HEP to decrease edema, pain and increase AROM in 4th ad 5th digit     Baseline  no knowledge     Time  2    Period  Weeks    Status  New    Target Date  05/14/18      OT SHORT TERM GOAL #2   Title  AROM in L 4thand 5th digit improve for pt to grip 2 cm cylinder - brush, knife     Baseline  4th MC55, PIP 75; 5th MC 30 and PIP 60 - cannot touch 4 cm foam block     Time  3    Period  Weeks    Status  New    Target Date  05/21/18        OT Long Term Goals - 04/30/18 1132      OT LONG TERM GOAL  #1   Title  Pain on PRWHE improve with more than 12 points     Baseline  pain score on PRWHE 19/50 -     Time  4    Period  Weeks    Status  New    Target Date  05/28/18      OT LONG TERM GOAL #2   Title  AROM in 4thand 5th improve for pt to touch palm to improve functional use score on PRWHE by 15 points     Baseline  See flowsheet- and PRHWE function score 26/50    Time  4    Period  Weeks    Status  New    Target Date  05/28/18      OT LONG TERM GOAL #3   Title  Grip strength in L improve to more than 50% to carry more than 5 lbs, cut with knife ,and squeeze washcloth     Baseline  Grip R 75 lbs , L 20 lbs - pain more than 5/10 - edema more than 1 cm in proximal phalanges 4th and 5th     Time  4    Period  Weeks    Status  New    Target Date  05/28/18            Plan - 05/07/18 1633    Clinical Impression Statement  Pt is 8 1/2 wks s/p pinning of neck fx L 5th -pt show increase PIP extention at 4th and 5th -and increase flexion at 4thand 5th PIP and 4th MC - but not at 5th [facility] - pain with PROM and tight - add prolonged stretch and PROM composite flexion to 5th -  PROM flexion in session at 5th [facility] was 45     Occupational performance deficits (Please refer  to evaluation for details):  ADL's;IADL's;Leisure;Play    Rehab Potential  Good    OT Frequency  1x / week    OT Duration  4 weeks    OT Treatment/Interventions  Self-care/ADL training;Paraffin;Therapeutic exercise;Scar mobilization;Manual Therapy;Fluidtherapy;Contrast Bath;Passive range of motion;Patient/family education    Plan  assess progress wtih 5th MC flexion with HEP and modify as needed      Clinical Decision Making  Several treatment options, min-mod task modification necessary    OT Home Exercise Plan  see pt instruction     Consulted and Agree with Plan of Care  Patient       Patient will benefit from skilled therapeutic intervention in order to improve the following deficits and impairments:  Impaired  sensation, Decreased range of motion, Decreased scar mobility, Increased edema, Pain, Impaired UE functional use, Impaired flexibility, Decreased strength  Visit Diagnosis: Stiffness of left hand, not elsewhere classified  Pain in left hand  Localized edema  Muscle weakness (generalized)    Problem List Patient Active Problem List   Diagnosis Date Noted  . Closed displaced fracture of neck of fifth metacarpal bone of left hand 03/07/2018  . Ulcerative colitis   . GERD (gastroesophageal reflux disease)   . Vaginal septum   . Anxiety     Rosalyn Gess OTR/L,CLT 05/07/2018, 4:38 PM  [place] [facility] [address], [place], [address] Phone: [phone]   Fax:  [phone]  Name: MEGGAN DHALIWAL MRN: [id number] Date of Birth: 09/29/1980

## 2018-05-08 ENCOUNTER — Encounter: Payer: Self-pay | Admitting: Physical Therapy

## 2018-05-08 ENCOUNTER — Ambulatory Visit: Payer: Medicaid Other | Admitting: Physical Therapy

## 2018-05-08 DIAGNOSIS — M25642 Stiffness of left hand, not elsewhere classified: Secondary | ICD-10-CM | POA: Diagnosis not present

## 2018-05-08 DIAGNOSIS — M6281 Muscle weakness (generalized): Secondary | ICD-10-CM

## 2018-05-08 DIAGNOSIS — M542 Cervicalgia: Secondary | ICD-10-CM

## 2018-05-08 NOTE — Therapy (Signed)
[place] [facility] [address] [place], [address] Phone: [phone]   Fax:  [phone]  Physical Therapy Treatment/Discharge Summary  Patient Details  Name: Victoria Pierce MRN: [id number] Date of Birth: 17-Jan-1981 Referring Provider (PT): Tomasa Hose   Encounter Date: 05/08/2018  PT End of Session - 05/08/18 1440    Visit Number  1    Number of Visits  3    Date for PT Re-Evaluation  05/13/18    Authorization Type  medicaid    PT Start Time  0100    PT Stop Time  0145    PT Time Calculation (min)  45 min    Activity Tolerance  Patient tolerated treatment well    Behavior During Therapy  [facility] for tasks assessed/performed       Past Medical History:  Diagnosis Date  . Anxiety   . Asthma   . Bipolar 1 disorder ([place])   . GERD (gastroesophageal reflux disease)   . Ulcerative colitis   . Vaginal septum     Past Surgical History:  Procedure Laterality Date  . CLOSED REDUCTION METACARPAL WITH PERCUTANEOUS PINNING Left 03/07/2018   Procedure: CLOSED REDUCTION METACARPAL WITH PERCUTANEOUS PINNING-5th metacarpal;  Surgeon: Corky Mull, MD;  Location: [facility];  Service: Orthopedics;  Laterality: Left;  . HARDWARE REMOVAL Left 04/03/2018   Procedure: LEFT HAND FIFTH METACARPAL PIN REMOVAL;  Surgeon: Corky Mull, MD;  Location: [place];  Service: Orthopedics;  Laterality: Left;  . TUBAL LIGATION      There were no vitals filed for this visit.  Subjective Assessment - 05/08/18 1314    Subjective  Pt reports 3/10 neck pain. Pt reports performing HEP with blue TB. Pt is aware to use thumb, index, and middle finger to hold TB due to fracture of the 5th metacarpal on left hand. Pt reports she believes she needs more OT treatment for fracture compared to PT treatment for the neck.     Pertinent History  Patient is 38 yo female that complains of chronic neck pain that started a couple of years ago.  States her pain is getting worse overtime.  States it feels like she has "bubbles" and neck tension. Complains of difficulty moving her neck, L and R pain. Reports that it feels like it always needs to "pop". Difficulty with posture, Driving, bending over, difficulty sleeping (going to sleep). Patient reports that she often cracks her neck/mid back, and that her boyfriend helps, ~4x a day. Has seen a chiropractor in the past. PMH includes GERD, bipolar, depression/axniety, ulcerative colitis, smoker.    Limitations  House hold activities;Reading;Other (comment)    How long can you sit comfortably?  NA    How long can you stand comfortably?  NA    How long can you walk comfortably?  NA    Diagnostic tests  xray    Patient Stated Goals  pain relief    Currently in Pain?  Yes    Pain Score  3     Pain Location  Neck    Pain Orientation  Left    Pain Descriptors / Indicators  Tightness;Aching    Pain Onset  More than a month ago       TREATMENT  THERAPEUTIC EXERCISE  B Supine YTI's: 2x10  B/L Upper Trap stretch with opposite arm behind back: 1x30   B/L Levator scapulae stretch opposite arm behind back: 1x20   Pec Doorway stretch: 1x30  Standing rows with TB (Blue): 2x10   Standing TB (Blue) High rows: 3x10   TherEx performed to improve periscapular strength to improve posture. Pt education on HEP performed. SPT reviewed HEP of levator scapulae stretch, UT stretch, and doorway pec stretch. Stretches given to improve muscle tightness and to improve posture. SPT also reviewed HEP strengthening exercises of YTI's, standing rows with TB, and standing high rows with TB in order to strengthen periscapular muscles to improve posture.   PT Education - 05/08/18 1321    Education Details  Educated on all HEP exercises for discharge. Form/tehcnique during exercise.     Person(s) Educated  Patient    Methods  Explanation    Comprehension  Verbalized understanding;Returned demonstration       PT  Short Term Goals - 03/05/18 1122      PT SHORT TERM GOAL #1   Title  Patient will be adherent to initial HEP at least 3x a week in prep for self management of condition.    Baseline  03/05/18: able to complete independently    Time  6    Period  Weeks    Status  Achieved        PT Long Term Goals - 05/08/18 1320      PT LONG TERM GOAL #1   Title  The patient will demonstrate at least 10 point improvement in FOTO score indicating an increased ease of performing functional tasks.    Baseline  05/08/2018, 74    Time  6    Period  Weeks    Status  Achieved      PT LONG TERM GOAL #2   Title  Patient will be independent in home exercise program to improve strength/mobility for better functional independence with ADLs.    Baseline  04/15/18 Able to complete again now that wrist is doing better    Time  6    Period  Weeks    Status  Achieved      PT LONG TERM GOAL #3   Title   Patient will be able to perform household work/chores with pain of 3/10 or less  to improve patient's tolerance to functional tasks.    Baseline  05/08/18 Worst pain 3/10    Time  6    Period  Weeks    Status  Achieved      PT LONG TERM GOAL #4   Title  Patient will increase BUE gross strength to 4+/5 as to improve functional strength for increased ADL/IADL ability.    Baseline  12/13019 R/L: shoulder flex 5/5 abd 5/5 IR 5/5 ER 5/5 ext 5/5    Time  6    Period  Weeks    Status  Achieved            Plan - 05/08/18 1423    Clinical Impression Statement  Pt's pain prior to session with 3/10 NPS. After STM and joint mobs pt's pain decreased to a 2/10 NPS. Pt demonstrated all exercises for HEP with minimal cueing and no increase in pain. Pt improved in carryover of exercises with minimal cueing needed to remind pt to decrease UT activation during exercises. Pt is being discharged due to limited visits remaining and pt has met all long term goals.     Rehab Potential  Good    Clinical Impairments Affecting  Rehab Potential  + motivation, decreased functional abilities, decreased sleep, decreased activity tolerance    PT Frequency  2x / week  PT Treatment/Interventions  ADLs/Self Care Home Management;Ultrasound;Neuromuscular re-education;Spinal Manipulations;Joint Manipulations;Passive range of motion;Patient/family education;Gait training;Canalith Repostioning;Cryotherapy;Dry needling;Energy conservation;Functional mobility training;Electrical Stimulation;Therapeutic activities;Iontophoresis 15m/ml Dexamethasone;Splinting;Taping;Vestibular;Manual techniques;Therapeutic exercise;Moist Heat;Traction    PT Next Visit Plan  STM, traction, postural strengthening, assess spine mobility    PT Home Exercise Plan  YTI, rows BTB, UT stretch, pec doorway stretch    Consulted and Agree with Plan of Care  Patient       Patient will benefit from skilled therapeutic intervention in order to improve the following deficits and impairments:  Decreased endurance, Hypomobility, Decreased activity tolerance, Decreased strength, Increased fascial restricitons, Pain, Increased muscle spasms, Decreased mobility, Decreased range of motion, Postural dysfunction, Improper body mechanics  Visit Diagnosis: Muscle weakness (generalized)  Cervicalgia     Problem List Patient Active Problem List   Diagnosis Date Noted  . Closed displaced fracture of neck of fifth metacarpal bone of left hand 03/07/2018  . Ulcerative colitis   . GERD (gastroesophageal reflux disease)   . Vaginal septum   . Anxiety    CShelton SilvasPT, DPT CShelton Silvas SPT 05/08/2018, 2:42 PM  [facility] [address] [place] [address]Phone: [phone]  Fax:  [phone] Name: KZAVIA PULLENMRN: [id number] of Birth: 12/03/1980/04/14

## 2018-05-13 ENCOUNTER — Telehealth: Payer: Self-pay | Admitting: Gastroenterology

## 2018-05-13 DIAGNOSIS — M24542 Contracture, left hand: Secondary | ICD-10-CM | POA: Insufficient documentation

## 2018-05-13 DIAGNOSIS — M25642 Stiffness of left hand, not elsewhere classified: Secondary | ICD-10-CM

## 2018-05-13 NOTE — Telephone Encounter (Signed)
Patient has been advised to hold am meds the morning of her procedure.  She has been asked to take her medications after her procedure instead.   Patient has been provided the following instructions for clarification for use of he SuPrep: Begin Suprep the evening before the procedure between 5pm-6pm , repeating again 5 hours prior to the time of her procedure by pouring one bottle of suprep into the measuring cup that comes with the kit and fill to the fill line with water or any clear liquid such as sprite or gingerale.  Do not eat or drink anything 4 hours prior to procedure.  Stressed the importance of staying well hydrated today by drinking at least 8 oz of fluids every hour to avoid dehydration. Patient expressed appreciation in my reviewing her instructions and expressed verbally that she understood.  Thanks [employer]

## 2018-05-13 NOTE — Telephone Encounter (Signed)
Patient called and is having a upper & lower Endoscopy 05-14-18 and would like to know what medications she can take tomorrow before her procedures.

## 2018-05-14 ENCOUNTER — Encounter: Payer: Self-pay | Admitting: *Deleted

## 2018-05-14 ENCOUNTER — Ambulatory Visit: Payer: Medicaid Other | Admitting: Certified Registered"

## 2018-05-14 ENCOUNTER — Ambulatory Visit
Admission: RE | Admit: 2018-05-14 | Discharge: 2018-05-14 | Disposition: A | Discharge status: Home / Self Care | Payer: Medicaid Other | Attending: Gastroenterology | Admitting: Gastroenterology

## 2018-05-14 ENCOUNTER — Encounter
Admission: RE | Disposition: A | Discharge status: Home / Self Care | Payer: Self-pay | Source: Home / Self Care | Attending: Gastroenterology

## 2018-05-14 DIAGNOSIS — K449 Diaphragmatic hernia without obstruction or gangrene: Secondary | ICD-10-CM | POA: Insufficient documentation

## 2018-05-14 DIAGNOSIS — R12 Heartburn: Secondary | ICD-10-CM | POA: Diagnosis not present

## 2018-05-14 DIAGNOSIS — K625 Hemorrhage of anus and rectum: Secondary | ICD-10-CM | POA: Diagnosis not present

## 2018-05-14 DIAGNOSIS — F17200 Nicotine dependence, unspecified, uncomplicated: Secondary | ICD-10-CM | POA: Diagnosis not present

## 2018-05-14 DIAGNOSIS — R0789 Other chest pain: Secondary | ICD-10-CM | POA: Insufficient documentation

## 2018-05-14 DIAGNOSIS — F419 Anxiety disorder, unspecified: Secondary | ICD-10-CM | POA: Diagnosis not present

## 2018-05-14 DIAGNOSIS — F319 Bipolar disorder, unspecified: Secondary | ICD-10-CM | POA: Diagnosis not present

## 2018-05-14 DIAGNOSIS — K5730 Diverticulosis of large intestine without perforation or abscess without bleeding: Secondary | ICD-10-CM | POA: Diagnosis not present

## 2018-05-14 DIAGNOSIS — Z79899 Other long term (current) drug therapy: Secondary | ICD-10-CM | POA: Diagnosis not present

## 2018-05-14 DIAGNOSIS — Z8711 Personal history of peptic ulcer disease: Secondary | ICD-10-CM | POA: Diagnosis not present

## 2018-05-14 DIAGNOSIS — R197 Diarrhea, unspecified: Secondary | ICD-10-CM | POA: Insufficient documentation

## 2018-05-14 DIAGNOSIS — J45909 Unspecified asthma, uncomplicated: Secondary | ICD-10-CM | POA: Diagnosis not present

## 2018-05-14 DIAGNOSIS — K3189 Other diseases of stomach and duodenum: Secondary | ICD-10-CM | POA: Insufficient documentation

## 2018-05-14 DIAGNOSIS — Z8719 Personal history of other diseases of the digestive system: Secondary | ICD-10-CM | POA: Diagnosis not present

## 2018-05-14 DIAGNOSIS — K219 Gastro-esophageal reflux disease without esophagitis: Secondary | ICD-10-CM | POA: Diagnosis not present

## 2018-05-14 HISTORY — PX: ESOPHAGOGASTRODUODENOSCOPY (EGD) WITH PROPOFOL: SHX5813

## 2018-05-14 HISTORY — DX: Depression, unspecified: F32.A

## 2018-05-14 HISTORY — PX: COLONOSCOPY WITH PROPOFOL: SHX5780

## 2018-05-14 HISTORY — DX: Major depressive disorder, single episode, unspecified: F32.9

## 2018-05-14 LAB — POCT PREGNANCY, URINE: Preg Test, Ur: NEGATIVE

## 2018-05-14 SURGERY — ESOPHAGOGASTRODUODENOSCOPY (EGD) WITH PROPOFOL
Anesthesia: General

## 2018-05-14 MED ORDER — SODIUM CHLORIDE 0.9 % IV SOLN
INTRAVENOUS | Status: DC
  Administered 2018-05-14: 1000 mL via INTRAVENOUS

## 2018-05-14 MED ORDER — PROPOFOL 10 MG/ML IV BOLUS
INTRAVENOUS | Status: AC
  Filled 2018-05-14: qty 40

## 2018-05-14 MED ORDER — PROPOFOL 10 MG/ML IV BOLUS
INTRAVENOUS | Status: DC | PRN
  Administered 2018-05-14: 70 mg via INTRAVENOUS

## 2018-05-14 MED ORDER — LIDOCAINE HCL (CARDIAC) PF 100 MG/5ML IV SOSY
PREFILLED_SYRINGE | INTRAVENOUS | Status: DC | PRN
  Administered 2018-05-14 (×2): 50 mg via INTRAVENOUS

## 2018-05-14 MED ORDER — PROPOFOL 500 MG/50ML IV EMUL
INTRAVENOUS | Status: DC | PRN
  Administered 2018-05-14: 150 ug/kg/min via INTRAVENOUS

## 2018-05-14 NOTE — Transfer of Care (Signed)
Immediate Anesthesia Transfer of Care Note  Patient: Victoria Pierce  Procedure(s) Performed: ESOPHAGOGASTRODUODENOSCOPY (EGD) WITH PROPOFOL (N/A ) COLONOSCOPY WITH PROPOFOL (N/A )  Patient Location: Endoscopy Unit  Anesthesia Type:General  Level of Consciousness: awake, alert  and oriented  Airway & Oxygen Therapy: Patient Spontanous Breathing and Patient connected to nasal cannula oxygen  Post-op Assessment: Report given to RN and Post -op Vital signs reviewed and stable  Post vital signs: Reviewed and stable  Last Vitals:  Vitals Value Taken Time  BP    Temp    Pulse    Resp    SpO2      Last Pain:  Vitals:   05/14/18 1237  TempSrc: Tympanic  PainSc: 0-No pain      Patients Stated Pain Goal: 0 (15/94/58 5929)  Complications: No apparent anesthesia complications

## 2018-05-14 NOTE — Anesthesia Post-op Follow-up Note (Signed)
Anesthesia QCDR form completed.        

## 2018-05-14 NOTE — Op Note (Signed)
[facility] Gastroenterology Patient Name: Victoria Pierce Procedure Date: 05/14/2018 1:22 PM MRN: [id number] Account #: [id number] Date of Birth: [birth year] Admit Type: Outpatient Age: 38 Room: [facility] ENDO ROOM 4 Gender: Female Note Status: Finalized Procedure:            Colonoscopy Indications:          Clinically significant diarrhea of unexplained origin,                        , reported h/o colitis Providers:            Lin Landsman MD, MD Medicines:            Monitored Anesthesia Care Complications:        No immediate complications. Estimated blood loss: None. Procedure:            Pre-Anesthesia Assessment:                       - Prior to the procedure, a History and Physical was                        performed, and patient medications and allergies were                        reviewed. The patient is competent. The risks and                        benefits of the procedure and the sedation options and                        risks were discussed with the patient. All questions                        were answered and informed consent was obtained.                        Patient identification and proposed procedure were                        verified by the physician, the nurse, the                        anesthesiologist, the anesthetist and the technician in                        the pre-procedure area in the procedure room in the                        endoscopy suite. Mental Status Examination: alert and                        oriented. Airway Examination: normal oropharyngeal                        airway and neck mobility. Respiratory Examination:                        clear to auscultation. CV Examination: normal.  Prophylactic Antibiotics: The patient does not require                        prophylactic antibiotics. Prior Anticoagulants: The                        patient has taken no previous anticoagulant or                         antiplatelet agents. ASA Grade Assessment: II - A                        patient with mild systemic disease. After reviewing the                        risks and benefits, the patient was deemed in                        satisfactory condition to undergo the procedure. The                        anesthesia plan was to use monitored anesthesia care                        (MAC). Immediately prior to administration of                        medications, the patient was re-assessed for adequacy                        to receive sedatives. The heart rate, respiratory rate,                        oxygen saturations, blood pressure, adequacy of                        pulmonary ventilation, and response to care were                        monitored throughout the procedure. The physical status                        of the patient was re-assessed after the procedure.                       After obtaining informed consent, the colonoscope was                        passed under direct vision. Throughout the procedure,                        the patient's blood pressure, pulse, and oxygen                        saturations were monitored continuously. The                        Colonoscope was introduced through the anus and  advanced to the the terminal ileum, with identification                        of the appendiceal orifice and IC valve. The                        colonoscopy was performed without difficulty. The                        patient tolerated the procedure well. The quality of                        the bowel preparation was evaluated using the BBPS                        [facility] Bowel Preparation Scale) with scores of: Right                        Colon = 3, Transverse Colon = 3 and Left Colon = 3                        (entire mucosa seen well with no residual staining,                        small fragments of stool or opaque liquid). The  total                        BBPS score equals 9. Findings:      The perianal and digital rectal examinations were normal. Pertinent       negatives include normal sphincter tone and no palpable rectal lesions.      The terminal ileum appeared normal.      Normal mucosa was found in the entire colon. Biopsies for histology were       taken with a cold forceps from the entire colon for evaluation of       microscopic colitis.      A few diverticula were found in the sigmoid colon and descending colon.      The retroflexed view of the distal rectum and anal verge was normal and       showed no anal or rectal abnormalities. Impression:           - The examined portion of the ileum was normal.                       - Normal mucosa in the entire examined colon. Biopsied.                       - Diverticulosis in the sigmoid colon and in the                        descending colon.                       - The distal rectum and anal verge are normal on                        retroflexion view. Recommendation:       - Discharge patient to home (with escort).                       -  Resume previous diet today.                       - Continue present medications.                       - Await pathology results.                       - Return to my office as previously scheduled. Procedure Code(s):    --- Professional ---                       [phone], Colonoscopy, flexible; with biopsy, single or                        multiple Diagnosis Code(s):    --- Professional ---                       K57.30, Diverticulosis of large intestine without                        perforation or abscess without bleeding                       R19.7, Diarrhea, unspecified CPT copyright 2018 American Medical Association. All rights reserved. The codes documented in this report are preliminary and upon coder review may  be revised to meet current compliance requirements. Dr. Ulyess Mort Lin Landsman MD,  MD 05/14/2018 1:52:27 PM This report has been signed electronically. Number of Addenda: 0 Note Initiated On: 05/14/2018 1:22 PM Scope Withdrawal Time: 0 hours 8 minutes 6 seconds  Total Procedure Duration: 0 hours 10 minutes 14 seconds       [facility]

## 2018-05-14 NOTE — H&P (Signed)
Cephas Darby, MD [address]  [place]  [place], [place] [address]  Main: [phone]  Fax: [phone] Pager: [phone]  Primary Care Physician:  [name], MD Primary Gastroenterologist:  Dr. Cephas Darby  Pre-Procedure History & Physical: HPI:  Victoria Pierce is a 38 y.o. female is here for an endoscopy and colonoscopy.   Past Medical History:  Diagnosis Date  . Anxiety   . Asthma   . Bipolar 1 disorder ([place])   . Depression   . GERD (gastroesophageal reflux disease)   . Ulcerative colitis   . Vaginal septum     Past Surgical History:  Procedure Laterality Date  . CLOSED REDUCTION METACARPAL WITH PERCUTANEOUS PINNING Left 03/07/2018   Procedure: CLOSED REDUCTION METACARPAL WITH PERCUTANEOUS PINNING-5th metacarpal;  Surgeon: Corky Mull, MD;  Location: [facility] ORS;  Service: Orthopedics;  Laterality: Left;  . HARDWARE REMOVAL Left 04/03/2018   Procedure: LEFT HAND FIFTH METACARPAL PIN REMOVAL;  Surgeon: Corky Mull, MD;  Location: [place];  Service: Orthopedics;  Laterality: Left;  . TUBAL LIGATION      Prior to Admission medications   Medication Sig Start Date End Date Taking? Authorizing Provider  busPIRone (BUSPAR) 5 MG tablet Take 5 mg by mouth 2 (two) times daily.   Yes [provider]  cyclobenzaprine (FLEXERIL) 10 MG tablet Take 10 mg by mouth 3 (three) times daily as needed for muscle spasms.    Yes [provider]  dicyclomine (BENTYL) 10 MG capsule Take 1 capsule (10 mg total) by mouth 3 (three) times daily before meals. 05/03/18 08/01/18 Yes Zalaya Astarita, Tally Due, MD  gabapentin (NEURONTIN) 300 MG capsule Take 900 mg by mouth 2 (two) times daily.    Yes [provider]  guanFACINE (INTUNIV) 1 MG TB24 ER tablet Take 1 mg by mouth daily.   Yes [provider]  hydrOXYzine (ATARAX/VISTARIL) 50 MG tablet Take 50 mg by mouth 2 (two) times daily as needed for anxiety.    Yes [provider]   lamoTRIgine (LAMICTAL) 100 MG tablet Take 100 mg by mouth 2 (two) times daily.   Yes [provider]  lurasidone (LATUDA) 40 MG TABS tablet Take 40 mg by mouth daily with breakfast.   Yes [provider]  Melatonin 5 MG CAPS Take 5 mg by mouth at bedtime.   Yes [provider]  Multiple Vitamin (MULTIVITAMIN WITH MINERALS) TABS tablet Take 1 tablet by mouth daily.   Yes [provider]  pantoprazole (PROTONIX) 40 MG tablet Take 1 tablet (40 mg total) by mouth 2 (two) times daily before a meal. 05/03/18 08/01/18 Yes Constant Mand[place], Tally Due, MD  promethazine (PHENERGAN) 25 MG tablet Take 25 mg by mouth every 6 (six) hours as needed for nausea or vomiting.   Yes [provider]  topiramate (TOPAMAX) 50 MG tablet Take 50 mg by mouth 2 (two) times daily.   Yes [provider]  traZODone (DESYREL) 100 MG tablet Take 100 mg by mouth at bedtime.   Yes [provider]  albuterol (PROVENTIL HFA;VENTOLIN HFA) 108 (90 BASE) MCG/ACT inhaler Inhale 2 puffs into the lungs every 4 (four) hours as needed for wheezing. 08/02/11 03/27/18  Rancour, Annie Main, MD  HYDROcodone-acetaminophen (NORCO/VICODIN) 5-325 MG tablet Take 1-2 tablets by mouth every 4 (four) hours as needed for moderate pain. Patient not taking: Reported on 05/03/2018 04/03/18   Poggi, Marshall Cork, MD  meloxicam (MOBIC) 15 MG tablet Take 1 tablet (15 mg  total) by mouth daily. Patient not taking: Reported on 05/03/2018 03/02/18   Cuthriell, Charline Bills, PA-C    Allergies as of 05/03/2018  . (No Known Allergies)    History reviewed. No pertinent family history.  Social History   Socioeconomic History  . Marital status: Single    Spouse name: Not on file  . Number of children: Not on file  . Years of education: Not on file  . Highest education level: Not on file  Occupational History  . Not on file  Social Needs  . Financial resource strain: Not on file  . Food insecurity:    Worry: Not on  file    Inability: Not on file  . Transportation needs:    Medical: Not on file    Non-medical: Not on file  Tobacco Use  . Smoking status: Current Every Day Smoker    Packs/day: 1.00    Years: 27.00    Pack years: 27.00    Types: Cigarettes  . Smokeless tobacco: Never Used  Substance and Sexual Activity  . Alcohol use: No  . Drug use: Yes    Types: Marijuana  . Sexual activity: Not on file  Lifestyle  . Physical activity:    Days per week: Not on file    Minutes per session: Not on file  . Stress: Not on file  Relationships  . Social connections:    Talks on phone: Not on file    Gets together: Not on file    Attends religious service: Not on file    Active member of club or organization: Not on file    Attends meetings of clubs or organizations: Not on file    Relationship status: Not on file  . Intimate partner violence:    Fear of current or ex partner: Not on file    Emotionally abused: Not on file    Physically abused: Not on file    Forced sexual activity: Not on file  Other Topics Concern  . Not on file  Social History Narrative  . Not on file    Review of Systems: See HPI, otherwise negative ROS  Physical Exam: BP 132/86   Pulse 97   Temp 98.6 F (37 C) (Tympanic)   Resp 18   Ht 5' 3"  (1.6 m)   Wt 83.9 kg   LMP 05/02/2018 (Exact Date)   SpO2 99%   BMI 32.77 kg/m  General:   Alert,  pleasant and cooperative in NAD Head:  Normocephalic and atraumatic. Neck:  Supple; no masses or thyromegaly. Lungs:  Clear throughout to auscultation.    Heart:  Regular rate and rhythm. Abdomen:  Soft, nontender and nondistended. Normal bowel sounds, without guarding, and without rebound.   Neurologic:  Alert and  oriented x4;  grossly normal neurologically.  Impression/Plan: Victoria Pierce is here for an endoscopy and colonoscopy to be performed for atypical chest pain, heart burn, diarrhea, h/o colitis  Risks, benefits, limitations, and alternatives  regarding  endoscopy and colonoscopy have been reviewed with the patient.  Questions have been answered.  All parties agreeable.   Sherri Sear, MD  05/14/2018, 1:22 PM

## 2018-05-14 NOTE — Anesthesia Preprocedure Evaluation (Signed)
Anesthesia Evaluation  Patient identified by MRN, date of birth, ID band Patient awake    Reviewed: Allergy & Precautions, H&P , NPO status , Patient's Chart, lab work & pertinent test results, reviewed documented beta blocker date and time   History of Anesthesia Complications Negative for: history of anesthetic complications  Airway Mallampati: I  TM Distance: >3 FB Neck ROM: full    Dental  (+) Dental Advidsory Given, Loose   Pulmonary neg shortness of breath, asthma , neg sleep apnea, neg recent URI, Current Smoker,           Cardiovascular Exercise Tolerance: Good negative cardio ROS       Neuro/Psych PSYCHIATRIC DISORDERS Anxiety Depression Bipolar Disorder negative neurological ROS     GI/Hepatic Neg liver ROS, PUD, GERD  ,  Endo/Other  negative endocrine ROS  Renal/GU negative Renal ROS  negative genitourinary   Musculoskeletal   Abdominal   Peds  Hematology negative hematology ROS (+)   Anesthesia Other Findings Past Medical History: No date: Anxiety No date: Asthma No date: Bipolar 1 disorder ([facility]) No date: Depression No date: GERD (gastroesophageal reflux disease) No date: Ulcerative colitis No date: Vaginal septum   Reproductive/Obstetrics negative OB ROS                             Anesthesia Physical Anesthesia Plan  ASA: II  Anesthesia Plan: General   Post-op Pain Management:    Induction: Intravenous  PONV Risk Score and Plan: 2 and Propofol infusion and TIVA  Airway Management Planned: Nasal Cannula and Natural Airway  Additional Equipment:   Intra-op Plan:   Post-operative Plan:   Informed Consent: I have reviewed the patients History and Physical, chart, labs and discussed the procedure including the risks, benefits and alternatives for the proposed anesthesia with the patient or authorized representative who has indicated his/her understanding and  acceptance.     Dental Advisory Given  Plan Discussed with: Anesthesiologist, CRNA and Surgeon  Anesthesia Plan Comments:         Anesthesia Quick Evaluation

## 2018-05-14 NOTE — Op Note (Signed)
[facility] Gastroenterology Patient Name: Victoria Pierce Procedure Date: 05/14/2018 1:22 PM MRN: [id number] Account #: [id number] Date of Birth: 02-20-1981 Admit Type: Outpatient Age: 38 Room: [facility] ENDO ROOM 4 Gender: Female Note Status: Finalized Procedure:            Upper GI endoscopy Indications:          Heartburn, Unexplained chest pain, Chest pain (non                        cardiac), Diarrhea Providers:            Lin Landsman MD, MD Medicines:            Monitored Anesthesia Care Complications:        No immediate complications. Estimated blood loss:                        Minimal. Procedure:            Pre-Anesthesia Assessment:                       - Prior to the procedure, a History and Physical was                        performed, and patient medications and allergies were                        reviewed. The patient is competent. The risks and                        benefits of the procedure and the sedation options and                        risks were discussed with the patient. All questions                        were answered and informed consent was obtained.                        Patient identification and proposed procedure were                        verified by the physician, the nurse, the                        anesthesiologist, the anesthetist and the technician in                        the pre-procedure area in the procedure room in the                        endoscopy suite. Mental Status Examination: alert and                        oriented. Airway Examination: normal oropharyngeal                        airway and neck mobility. Respiratory Examination:                        clear to auscultation. CV  Examination: normal.                        Prophylactic Antibiotics: The patient does not require                        prophylactic antibiotics. Prior Anticoagulants: The                        patient has taken no  previous anticoagulant or                        antiplatelet agents. ASA Grade Assessment: II - A                        patient with mild systemic disease. After reviewing the                        risks and benefits, the patient was deemed in                        satisfactory condition to undergo the procedure. The                        anesthesia plan was to use monitored anesthesia care                        (MAC). Immediately prior to administration of                        medications, the patient was re-assessed for adequacy                        to receive sedatives. The heart rate, respiratory rate,                        oxygen saturations, blood pressure, adequacy of                        pulmonary ventilation, and response to care were                        monitored throughout the procedure. The physical status                        of the patient was re-assessed after the procedure.                       After obtaining informed consent, the endoscope was                        passed under direct vision. Throughout the procedure,                        the patient's blood pressure, pulse, and oxygen                        saturations were monitored continuously. The Endoscope                        was introduced through the  mouth, and advanced to the                        second part of duodenum. The upper GI endoscopy was                        accomplished without difficulty. The patient tolerated                        the procedure fairly well. Findings:      The duodenal bulb and second portion of the duodenum were normal.       Biopsies for histology were taken with a cold forceps for evaluation of       celiac disease.      A 4 cm hiatal hernia was present.      Striped mildly erythematous mucosa without bleeding was found in the       gastric body and in the gastric antrum. Biopsies were taken with a cold       forceps for Helicobacter pylori testing.       A few dispersed, diminutive non-bleeding erosions were found in the       gastric antrum. There were no stigmata of recent bleeding.      The cardia and gastric fundus were normal on retroflexion.      Esophagogastric landmarks were identified: the gastroesophageal junction       was found at 36 cm from the incisors.      The examined esophagus was normal. Biopsies were taken with a cold       forceps for histology. Impression:           - Normal duodenal bulb and second portion of the                        duodenum. Biopsied.                       - 4 cm hiatal hernia.                       - Erythematous mucosa in the gastric body and antrum.                        Biopsied.                       - Non-bleeding erosive gastropathy.                       - Esophagogastric landmarks identified.                       - Normal esophagus. Biopsied. Recommendation:       - Await pathology results.                       - Continue present medications.                       - Use a proton pump inhibitor PO BID.                       - Evaluate for fundoplication if path normal and not  responding to optimal dose PPI Procedure Code(s):    --- Professional ---                       [phone], Esophagogastroduodenoscopy, flexible, transoral;                        with biopsy, single or multiple Diagnosis Code(s):    --- Professional ---                       K44.9, Diaphragmatic hernia without obstruction or                        gangrene                       K31.89, Other diseases of stomach and duodenum                       R12, Heartburn                       R07.9, Chest pain, unspecified                       R07.89, Other chest pain                       R19.7, Diarrhea, unspecified CPT copyright 2018 American Medical Association. All rights reserved. The codes documented in this report are preliminary and upon coder review may  be revised to meet current  compliance requirements. Dr. Ulyess Mort Lin Landsman MD, MD 05/14/2018 1:37:34 PM This report has been signed electronically. Number of Addenda: 0 Note Initiated On: 05/14/2018 1:22 PM      [facility]

## 2018-05-15 ENCOUNTER — Encounter: Payer: Self-pay | Admitting: Gastroenterology

## 2018-05-15 ENCOUNTER — Telehealth: Payer: Self-pay | Admitting: Gastroenterology

## 2018-05-15 NOTE — Telephone Encounter (Signed)
Patient called & would like to know how to proceed with the Hiatal Hernia that was found during the procedure? She also would like the results to the biopsies when available.

## 2018-05-16 ENCOUNTER — Ambulatory Visit: Payer: Medicaid Other | Admitting: Occupational Therapy

## 2018-05-16 DIAGNOSIS — R600 Localized edema: Secondary | ICD-10-CM

## 2018-05-16 DIAGNOSIS — M79642 Pain in left hand: Secondary | ICD-10-CM

## 2018-05-16 DIAGNOSIS — M25642 Stiffness of left hand, not elsewhere classified: Secondary | ICD-10-CM | POA: Diagnosis not present

## 2018-05-16 LAB — SURGICAL PATHOLOGY

## 2018-05-16 NOTE — Patient Instructions (Signed)
Pt to cont with tendon glides a And pt fitted with joint jack to use on 5th PIP extention slight pull  Can keep on for about 10 -20 min 3-5 x day  And cont AROM and some scar massage

## 2018-05-16 NOTE — Therapy (Signed)
[facility] [address] [place], [address] Phone: [phone]   Fax:  [phone]  Occupational Therapy Treatment  Patient Details  Name: Victoria Pierce MRN: [id number] Date of Birth: [birth year] Referring Provider (OT): poggi   Encounter Date: 05/16/2018  OT End of Session - 05/16/18 1858    Visit Number  3    Number of Visits  8    Date for OT Re-Evaluation  05/28/18    Authorization - Visit Number  2    Authorization - Number of Visits  3    OT Start Time  0908    OT Stop Time  0950    OT Time Calculation (min)  42 min    Activity Tolerance  Patient tolerated treatment well    Behavior During Therapy  [facility] for tasks assessed/performed       Past Medical History:  Diagnosis Date  . Anxiety   . Asthma   . Bipolar 1 disorder ([place])   . Depression   . GERD (gastroesophageal reflux disease)   . Ulcerative colitis   . Vaginal septum     Past Surgical History:  Procedure Laterality Date  . CLOSED REDUCTION METACARPAL WITH PERCUTANEOUS PINNING Left 03/07/2018   Procedure: CLOSED REDUCTION METACARPAL WITH PERCUTANEOUS PINNING-5th metacarpal;  Surgeon: Corky Mull, MD;  Location: [facility] ORS;  Service: Orthopedics;  Laterality: Left;  . COLONOSCOPY WITH PROPOFOL N/A 05/14/2018   Procedure: COLONOSCOPY WITH PROPOFOL;  Surgeon: Lin Landsman, MD;  Location: [facility]COPY;  Service: Gastroenterology;  Laterality: N/A;  . ESOPHAGOGASTRODUODENOSCOPY (EGD) WITH PROPOFOL N/A 05/14/2018   Procedure: ESOPHAGOGASTRODUODENOSCOPY (EGD) WITH PROPOFOL;  Surgeon: Lin Landsman, MD;  Location: [facility] ENDOSCOPY;  Service: Gastroenterology;  Laterality: N/A;  . HARDWARE REMOVAL Left 04/03/2018   Procedure: LEFT HAND FIFTH METACARPAL PIN REMOVAL;  Surgeon: Corky Mull, MD;  Location: [place];  Service: Orthopedics;  Laterality: Left;  . TUBAL LIGATION      There were no vitals filed for this  visit.  Subjective Assessment - 05/16/18 0924    Subjective   I have seen Dr Roland Rack and he is planning surgery because of my joint do not want to bend and scar tissue - he recommend to ask you about joint jack splint for pinkie     Patient Stated Goals  To get my motion and strength back in my L hand - and bend my pinkie     Currently in Pain?  No/denies         [facility] OT Assessment - 05/16/18 0001      Left Hand AROM   L Ring  MCP 0-90  60 Degrees    L Ring PIP 0-100  85 Degrees   -18   L Little  MCP 0-90  30 Degrees    L Little PIP 0-100  80 Degrees   -35     Measurements taken - pt show no progress at 5th MC flexion and extention of PIP  Pt seen surgeon and planning tenolysis and capsule release - pt to contact me when she needs to come back afterwards for therapy   Scar tissue where pin was is tender and adhere          OT Treatments/Exercises (OP) - 05/16/18 0001      LUE Fluidotherapy   Number Minutes Fluidotherapy  8 Minutes    LUE Fluidotherapy Location  Hand    Comments  AROM for digits  to increase ROM       Pt was assess with LMB PIP extention splint and joint jack - the joint jack provided more of stretch at PIP - LMB gave some pressure on DIP  Pt ed on using of it - slight pull or stretch - less than 1-2/10 pain  And can use 10 min to 30 min at time - but pain free  And cont tendon glides   After fluidotherapy - done some graston tool nr 2 for brushing over volar 5th and 4th - prior to ROM and use of joint jack 2nd and 3rd time for 5 min - pt tolerated well  Pt ed on use of splint and precautions        OT Education - 05/16/18 1858    Education Details  use of joint jack for extnetion of 5th PIP     Person(s) Educated  Patient    Methods  Demonstration;Verbal cues;Handout    Comprehension  Returned demonstration       OT Short Term Goals - 04/30/18 1121      OT SHORT TERM GOAL #1   Title  Pt to be ind in HEP to decrease edema, pain and  increase AROM in 4th ad 5th digit     Baseline  no knowledge     Time  2    Period  Weeks    Status  New    Target Date  05/14/18      OT SHORT TERM GOAL #2   Title  AROM in L 4thand 5th digit improve for pt to grip 2 cm cylinder - brush, knife     Baseline  4th MC55, PIP 75; 5th MC 30 and PIP 60 - cannot touch 4 cm foam block     Time  3    Period  Weeks    Status  New    Target Date  05/21/18        OT Long Term Goals - 04/30/18 1132      OT LONG TERM GOAL #1   Title  Pain on PRWHE improve with more than 12 points     Baseline  pain score on PRWHE 19/50 -     Time  4    Period  Weeks    Status  New    Target Date  05/28/18      OT LONG TERM GOAL #2   Title  AROM in 4thand 5th improve for pt to touch palm to improve functional use score on PRWHE by 15 points     Baseline  See flowsheet- and PRHWE function score 26/50    Time  4    Period  Weeks    Status  New    Target Date  05/28/18      OT LONG TERM GOAL #3   Title  Grip strength in L improve to more than 50% to carry more than 5 lbs, cut with knife ,and squeeze washcloth     Baseline  Grip R 75 lbs , L 20 lbs - pain more than 5/10 - edema more than 1 cm in proximal phalanges 4th and 5th     Time  4    Period  Weeks    Status  New    Target Date  05/28/18            Plan - 05/16/18 1859    Clinical Impression Statement  Pt is about 9 wks out from s/p pinning  of neck fx L 5th -pt show decrease extention of PIP of 5th and her flexion of 5th MC had not improve since eval and hard block - pt did show increase extention of PIP after soft tissue and pt was fitted this date with Joint jack to use for PIP extention - and to use until having surgery for scar release and joint capsule - pt awaiting date from surgeon -pt wil cont with HEP until then     Plan  Pt will contact me when she had surgery and need to come back for hand therapy /OT     Clinical Decision Making  Several treatment options, min-mod task  modification necessary    OT Home Exercise Plan  see pt instruction     Consulted and Agree with Plan of Care  Patient       Patient will benefit from skilled therapeutic intervention in order to improve the following deficits and impairments:     Visit Diagnosis: Stiffness of left hand, not elsewhere classified  Pain in left hand  Localized edema    Problem List Patient Active Problem List   Diagnosis Date Noted  . Closed displaced fracture of neck of fifth metacarpal bone of left hand 03/07/2018  . Ulcerative colitis   . GERD (gastroesophageal reflux disease)   . Vaginal septum   . Anxiety     Rosalyn Gess OTR/l,CLT 05/16/2018, 7:04 PM  [facility] [address], [place], [address] Phone: [phone]   Fax:  [phone]  Name: ALIKA EPPES MRN: [id number] Date of Birth: [birth year]

## 2018-05-16 NOTE — Telephone Encounter (Signed)
Pt is calling to speak with nurse about the results she received she is very confused about them

## 2018-05-20 NOTE — Anesthesia Postprocedure Evaluation (Signed)
Anesthesia Post Note  Patient: ARLOA PRAK  Procedure(s) Performed: ESOPHAGOGASTRODUODENOSCOPY (EGD) WITH PROPOFOL (N/A ) COLONOSCOPY WITH PROPOFOL (N/A )  Patient location during evaluation: Endoscopy Anesthesia Type: General Level of consciousness: awake and alert Pain management: pain level controlled Vital Signs Assessment: post-procedure vital signs reviewed and stable Respiratory status: spontaneous breathing, nonlabored ventilation and respiratory function stable Cardiovascular status: blood pressure returned to baseline and stable Postop Assessment: no apparent nausea or vomiting Anesthetic complications: no     Last Vitals:  Vitals:   05/14/18 1414 05/14/18 1416  BP: 111/80 111/80  Pulse: 87 89  Resp: 18 17  Temp:    SpO2: 100% 100%    Last Pain:  Vitals:   05/15/18 0807  TempSrc:   PainSc: 0-No pain                 Alphonsus Sias

## 2018-05-20 NOTE — Telephone Encounter (Signed)
Can I send a referral for pt to [place] [facility] for hernia surgery per pt?

## 2018-05-21 ENCOUNTER — Ambulatory Visit: Payer: Medicaid Other | Attending: Surgery | Admitting: Occupational Therapy

## 2018-05-21 DIAGNOSIS — M6281 Muscle weakness (generalized): Secondary | ICD-10-CM | POA: Insufficient documentation

## 2018-05-21 DIAGNOSIS — R600 Localized edema: Secondary | ICD-10-CM | POA: Insufficient documentation

## 2018-05-21 DIAGNOSIS — M79642 Pain in left hand: Secondary | ICD-10-CM | POA: Insufficient documentation

## 2018-05-21 DIAGNOSIS — M25642 Stiffness of left hand, not elsewhere classified: Secondary | ICD-10-CM | POA: Diagnosis not present

## 2018-05-21 NOTE — Therapy (Signed)
[facility] [address], [place], [address] Phone: [phone]   Fax:  [phone]  Occupational Therapy Treatment  Patient Details  Name: Victoria Pierce MRN: [id number] Date of Birth: 1981/02/20 Referring Provider (OT): poggi   Encounter Date: 05/21/2018  OT End of Session - 05/21/18 1502    Visit Number  4    Number of Visits  8    Date for OT Re-Evaluation  06/18/18    Authorization - Visit Number  3    Authorization - Number of Visits  3    OT Start Time  6381    OT Stop Time  1420    OT Time Calculation (min)  35 min    Activity Tolerance  Patient tolerated treatment well    Behavior During Therapy  [facility] for tasks assessed/performed       Past Medical History:  Diagnosis Date  . Anxiety   . Asthma   . Bipolar 1 disorder ([place])   . Depression   . GERD (gastroesophageal reflux disease)   . Ulcerative colitis   . Vaginal septum     Past Surgical History:  Procedure Laterality Date  . CLOSED REDUCTION METACARPAL WITH PERCUTANEOUS PINNING Left 03/07/2018   Procedure: CLOSED REDUCTION METACARPAL WITH PERCUTANEOUS PINNING-5th metacarpal;  Surgeon: Corky Mull, MD;  Location: [facility] ORS;  Service: Orthopedics;  Laterality: Left;  . COLONOSCOPY WITH PROPOFOL N/A 05/14/2018   Procedure: COLONOSCOPY WITH PROPOFOL;  Surgeon: Lin Landsman, MD;  Location: [facility] ENDOSCOPY;  Service: Gastroenterology;  Laterality: N/A;  . ESOPHAGOGASTRODUODENOSCOPY (EGD) WITH PROPOFOL N/A 05/14/2018   Procedure: ESOPHAGOGASTRODUODENOSCOPY (EGD) WITH PROPOFOL;  Surgeon: Lin Landsman, MD;  Location: [facility] ENDOSCOPY;  Service: Gastroenterology;  Laterality: N/A;  . HARDWARE REMOVAL Left 04/03/2018   Procedure: LEFT HAND FIFTH METACARPAL PIN REMOVAL;  Surgeon: Corky Mull, MD;  Location: [place];  Service: Orthopedics;  Laterality: Left;  . TUBAL LIGATION      There were no vitals filed for this  visit.  Subjective Assessment - 05/21/18 1458    Subjective   I worked my finger and used that  joint jack like you told me - it is more straight but I think I need that night splint you told me about - I need this finger straight before going into surgery  the 4th of March     Patient Stated Goals  To get my motion and strength back in my L hand - and bend my pinkie     Currently in Pain?  No/denies         [facility] OT Assessment - 05/21/18 0001      Left Hand AROM   L Ring  MCP 0-90  65 Degrees    L Ring PIP 0-100  90 Degrees   -15   L Little  MCP 0-90  25 Degrees    L Little PIP 0-100  85 Degrees   -25      Pt seen for 3 sessions and showed progress in 4th PIP and MC and 5th PIP flexion - and extention of PIP of 4th and 5th this past week with using joint jack  This date done extention gutter splint for 5th - to wear night time with coban wrap for edema She is schedule for surgery on [facility] of 5th tenolysis and release of capsule- for 4th March - but need full extention of PIP of 5th -  OT Treatments/Exercises (OP) - 05/21/18 0001      LUE Contrast Bath   Time  11 minutes    Comments  prior to ROM, soft tissue       done some graston tool nr 2 for brushing and sweeping over volar 4thand 5th prior to ROM for flexion and extention of 4th and 5th   Fabricate extention gutter splint for PIP - for nigh time using coban wrap - at about -12to -15 degrees - pt to be seen 2 more visit prior to surgery        OT Education - 05/21/18 1502    Education Details  use of joint jack for extnetion of 5th PIP and extention gutter splint     Person(s) Educated  Patient    Methods  Demonstration;Verbal cues;Handout    Comprehension  Returned demonstration       OT Short Term Goals - 05/21/18 1506      OT SHORT TERM GOAL #1   Title  Pt to be ind in HEP to decrease edema, pain and increase AROM in 4th ad 5th digit     Baseline  pain decrease and flexion in 5th PIP , MC and  PIP of 4th - as well as the last week in PIP extention of 5th - cont to use joint jack and night splint     Time  --    Period  --    Status  Achieved    Target Date  06/04/18      OT SHORT TERM GOAL #2   Title  AROM in L 4thand 5th digit improve for pt to grip 2 cm cylinder - brush, knife     Baseline  at eval 4th  MC55, PIP 75; 5th MC 30 and PIP 60 - and NOW 4th MC65, PIP 90; MC 25, PIP 85  - surgery schedule for 5th MC in month     Time  3    Status  On-going    Target Date  06/11/18        OT Long Term Goals - 05/21/18 1508      OT LONG TERM GOAL #1   Title  Pain on PRWHE improve with more than 12 points     Baseline  pain score on PRWHE 19/50 - improving but still pain with AROM and PROM of 5th MC     Time  4    Period  Weeks    Status  On-going    Target Date  06/18/18      OT LONG TERM GOAL #2   Title  AROM in 4thand 5th improve for pt to touch palm to improve functional use score on PRWHE by 15 points     Baseline  See flowsheet- and PRHWE function score 26/50- schedule for surgery for MC flexion of 5th - but improve on 4th PIP, MC and 5th PIP    Time  4    Status  On-going    Target Date  06/18/18      OT LONG TERM GOAL #3   Title  Grip strength in L improve to more than 50% to carry more than 5 lbs, cut with knife ,and squeeze washcloth     Baseline  Grip R 75 lbs , L 20 lbs - pain more than 5/10 - to be assess next session     Time  4    Period  Weeks    Status  On-going  Target Date  06/18/18            Plan - 05/21/18 1503    Clinical Impression Statement  Pt is about 10 wks s/p pinnning of neck fx L 5th - pt show improvement in extention since last week when she was fitted with jont jack for extnetion of 5th and 4th - pt need to get full extention prior to surgery on 06/19/2018 for tenolysis and joint capsule release of 5th MC -  pt showed progress in flexion in 4th PIP and MC and 5th PIP - but not flexion at 5th [facility] - pt to be seen 2 more times to adjust  night splint for PIP extention - prior to surgery in month     Occupational performance deficits (Please refer to evaluation for details):  ADL's;IADL's;Leisure;Play    Rehab Potential  Good    OT Frequency  Biweekly    OT Duration  4 weeks    OT Treatment/Interventions  Self-care/ADL training;Paraffin;Therapeutic exercise;Scar mobilization;Manual Therapy;Fluidtherapy;Contrast Bath;Passive range of motion;Patient/family education    Plan  adjust extnetion gutter splint to 5th PIP for night time     Clinical Decision Making  Several treatment options, min-mod task modification necessary    OT Home Exercise Plan  see pt instruction     Consulted and Agree with Plan of Care  Patient       Patient will benefit from skilled therapeutic intervention in order to improve the following deficits and impairments:  Impaired sensation, Decreased range of motion, Decreased scar mobility, Increased edema, Pain, Impaired UE functional use, Impaired flexibility, Decreased strength  Visit Diagnosis: Stiffness of left hand, not elsewhere classified  Pain in left hand  Localized edema  Muscle weakness (generalized)    Problem List Patient Active Problem List   Diagnosis Date Noted  . Closed displaced fracture of neck of fifth metacarpal bone of left hand 03/07/2018  . Ulcerative colitis   . GERD (gastroesophageal reflux disease)   . Vaginal septum   . Anxiety     Rosalyn Gess OTR/l,CLT 05/21/2018, 3:25 PM  [facility] [address], [place], [address] Phone: [phone]   Fax:  [phone]  Name: ALAYSIAH BROWDER MRN: [id number] Date of Birth: 06-Jan-1981

## 2018-05-21 NOTE — Patient Instructions (Signed)
Same HEP for AROM , PROM , contrast  And night splint with coban And cont joint jack during day for PIP extention - but keep slight pull

## 2018-05-23 ENCOUNTER — Other Ambulatory Visit: Payer: Self-pay

## 2018-05-23 DIAGNOSIS — K449 Diaphragmatic hernia without obstruction or gangrene: Secondary | ICD-10-CM

## 2018-05-23 DIAGNOSIS — Z9889 Other specified postprocedural states: Secondary | ICD-10-CM

## 2018-05-23 NOTE — Telephone Encounter (Signed)
Patient has been notified of results and labs and referral has been ordered, pt verbalized understanding

## 2018-05-27 ENCOUNTER — Other Ambulatory Visit: Payer: Self-pay

## 2018-05-27 DIAGNOSIS — Z9889 Other specified postprocedural states: Secondary | ICD-10-CM

## 2018-05-29 LAB — CELIAC DISEASE PANEL
Endomysial IgA: NEGATIVE
IgA/Immunoglobulin A, Serum: 157 mg/dL (ref 87–352)
Transglutaminase IgA: <2 U/mL (ref 0–3)

## 2018-06-03 ENCOUNTER — Telehealth: Payer: Self-pay | Admitting: *Deleted

## 2018-06-03 ENCOUNTER — Encounter: Payer: Self-pay | Admitting: Surgery

## 2018-06-03 ENCOUNTER — Other Ambulatory Visit: Payer: Self-pay

## 2018-06-03 ENCOUNTER — Ambulatory Visit (INDEPENDENT_AMBULATORY_CARE_PROVIDER_SITE_OTHER): Payer: Medicaid Other | Admitting: Surgery

## 2018-06-03 VITALS — BP 124/74 | HR 72 | Temp 97.7°F | Ht 63.0 in | Wt 186.0 lb

## 2018-06-03 DIAGNOSIS — R1084 Generalized abdominal pain: Secondary | ICD-10-CM | POA: Diagnosis not present

## 2018-06-03 DIAGNOSIS — K449 Diaphragmatic hernia without obstruction or gangrene: Secondary | ICD-10-CM | POA: Diagnosis not present

## 2018-06-03 NOTE — Telephone Encounter (Signed)
Patient called the office back and was notified per previous note.   The patient verbalizes understanding.

## 2018-06-03 NOTE — Patient Instructions (Addendum)
Patient will need to have a Barium Swallow and a CT Scan of the chest with IV contrast. Patient will also need a referral to the [facility]. She will need to return to the office in 2 weeks with Dr.Pabon.   Call the office with any questions or concerns.

## 2018-06-03 NOTE — Telephone Encounter (Signed)
Message left for patient to call the office.   Patient has been scheduled for a CT chest with contrast for 06-10-18 at [facility] for 11:30 am (arrive 11:15 am). Prep: NPO 4 hours prior.   Also, patient has been scheduled for a barium swallow for 06-13-18 at [facility] for 9:30 am (arrive 9:15 am). Prep: NPO 3 hours prior.   *Per Scheduling Department, these cannot be done the same day.  Patient to follow up in the office with Dr. Dahlia Byes as scheduled for 06-19-18 at 9:15 am.  Referral made to [facility]. Patient reports she has been seen by their office in 2010. The patient is aware to be expecting a call from their office regarding date/time.

## 2018-06-03 NOTE — Progress Notes (Signed)
Patient ID: Victoria Pierce, female   DOB: 11-08-1980, 38 y.o.   MRN: [id number]  HPI Victoria Pierce is a 38 y.o. female seen in consultation at the request of Dr. Marius Ditch.  The patient reports having some intermittent chest and epigastric pain for years now.  She reports that she has some heartburn that is likely related to her retrosternal pain.  She has had extensive cardiac work-up without any clear evidence of cardiac etiology.  Most recently had an EGD and colonoscopy by Dr. Marius Ditch.  I have personally review the pictures showing a 4 cm hiatal hernia.  I have also personally  reviewed a CT scan from 2018 without any major abnormalities.  Specifically I did not see a hernia at that time.  She reports some chronic pelvic pain and has been seen by [facility]YN and is requesting to go back to them for further work-up. Patient also endorses some episodes of emesis of undigested food that sometimes she has to force She is smokes daily and has a history of asthma. She Is able to perform more than 4 METS of activity without any shortness of breath or chest pain. Denies any fevers any chills no weight loss.      HPI  Past Medical History:  Diagnosis Date  . Anxiety   . Asthma   . Bipolar 1 disorder ([place])   . Depression   . GERD (gastroesophageal reflux disease)   . Ulcerative colitis   . Vaginal septum     Past Surgical History:  Procedure Laterality Date  . CLOSED REDUCTION METACARPAL WITH PERCUTANEOUS PINNING Left 03/07/2018   Procedure: CLOSED REDUCTION METACARPAL WITH PERCUTANEOUS PINNING-5th metacarpal;  Surgeon: Corky Mull, MD;  Location: [facility] ORS;  Service: Orthopedics;  Laterality: Left;  . COLONOSCOPY WITH PROPOFOL N/A 05/14/2018   Procedure: COLONOSCOPY WITH PROPOFOL;  Surgeon: Lin Landsman, MD;  Location: [facility] ENDOSCOPY;  Service: Gastroenterology;  Laterality: N/A;  . ESOPHAGOGASTRODUODENOSCOPY (EGD) WITH PROPOFOL N/A 05/14/2018   Procedure:  ESOPHAGOGASTRODUODENOSCOPY (EGD) WITH PROPOFOL;  Surgeon: Lin Landsman, MD;  Location: [facility] ENDOSCOPY;  Service: Gastroenterology;  Laterality: N/A;  . HARDWARE REMOVAL Left 04/03/2018   Procedure: LEFT HAND FIFTH METACARPAL PIN REMOVAL;  Surgeon: Corky Mull, MD;  Location: [place];  Service: Orthopedics;  Laterality: Left;  . TUBAL LIGATION      No family history on file.  Social History Social History   Tobacco Use  . Smoking status: Current Every Day Smoker    Packs/day: 1.00    Years: 27.00    Pack years: 27.00    Types: Cigarettes  . Smokeless tobacco: Never Used  Substance Use Topics  . Alcohol use: No  . Drug use: Yes    Types: Marijuana    No Known Allergies  Current Outpatient Medications  Medication Sig Dispense Refill  . busPIRone (BUSPAR) 5 MG tablet Take 5 mg by mouth 2 (two) times daily.    . cyclobenzaprine (FLEXERIL) 10 MG tablet Take 10 mg by mouth 3 (three) times daily as needed for muscle spasms.     Marland Kitchen dicyclomine (BENTYL) 10 MG capsule Take 1 capsule (10 mg total) by mouth 3 (three) times daily before meals. 270 capsule 1  . gabapentin (NEURONTIN) 300 MG capsule Take 900 mg by mouth 2 (two) times daily.     Marland Kitchen guanFACINE (INTUNIV) 1 MG TB24 ER tablet Take 1 mg by mouth daily.    . hydrOXYzine (ATARAX/VISTARIL) 50 MG tablet Take 50 mg  by mouth 2 (two) times daily as needed for anxiety.     . lamoTRIgine (LAMICTAL) 100 MG tablet Take 100 mg by mouth 2 (two) times daily.    Marland Kitchen lurasidone (LATUDA) 40 MG TABS tablet Take 40 mg by mouth daily with breakfast.    . Melatonin 5 MG CAPS Take 5 mg by mouth at bedtime.    . Multiple Vitamin (MULTIVITAMIN WITH MINERALS) TABS tablet Take 1 tablet by mouth daily.    . naproxen (NAPROSYN) 500 MG tablet Take 500 mg by mouth 2 (two) times daily with a meal.    . pantoprazole (PROTONIX) 40 MG tablet Take 1 tablet (40 mg total) by mouth 2 (two) times daily before a meal. 180 tablet 1  . promethazine  (PHENERGAN) 25 MG tablet Take 25 mg by mouth every 6 (six) hours as needed for nausea or vomiting.    . topiramate (TOPAMAX) 50 MG tablet Take 50 mg by mouth 2 (two) times daily.    . traZODone (DESYREL) 100 MG tablet Take 100 mg by mouth at bedtime.    Marland Kitchen albuterol (PROVENTIL HFA;VENTOLIN HFA) 108 (90 BASE) MCG/ACT inhaler Inhale 2 puffs into the lungs every 4 (four) hours as needed for wheezing. 1 Inhaler 0   No current facility-administered medications for this visit.      Review of Systems Full ROS  was asked and was negative except for the information on the HPI  Physical Exam Blood pressure 124/74, pulse 72, temperature 97.7 F (36.5 C), temperature source Skin, height 5' 3"  (1.6 m), weight 186 lb (84.4 kg), SpO2 98 %. CONSTITUTIONAL: NAD EYES: Pupils are equal, round, and reactive to light, Sclera are non-icteric. EARS, NOSE, MOUTH AND THROAT: The oropharynx is clear. The oral mucosa is pink and moist. Hearing is intact to voice. LYMPH NODES:  Lymph nodes in the neck are normal. RESPIRATORY:  Lungs are clear. There is normal respiratory effort, with equal breath sounds bilaterally, and without pathologic use of accessory muscles. CARDIOVASCULAR: Heart is regular without murmurs, gallops, or rubs. GI: The abdomen is soft,mild epigastric tenderness, no peritonitis. No masses. Mild RLQ tenderness.  GU: Rectal deferred.   MUSCULOSKELETAL: Normal muscle strength and tone. No cyanosis or edema.   SKIN: Turgor is good and there are no pathologic skin lesions or ulcers. NEUROLOGIC: Motor and sensation is grossly normal. Cranial nerves are grossly intact. PSYCH:  Oriented to person, place and time. Affect is normal.  Data Reviewed  I have personally reviewed the patient's imaging, laboratory findings and medical records.    Assessment/Plan 38 year old female with retrosternal pain and reflux consistent with a symptomatic hiatal hernia.  We will proceed with additional work-up including  a CT scan of the chest as well as a barium swallow.  I will also request another evaluation by GYN since she does have some chronic pain that she have this looked at. After we complete the work-up we will talk about potential surgery for the hiatal hernia.  At this point there is no need for any emergent surgical intervention.   Caroleen Hamman, MD FACS General Surgeon 06/03/2018, 12:57 PM

## 2018-06-10 ENCOUNTER — Ambulatory Visit
Admission: RE | Admit: 2018-06-10 | Discharge: 2018-06-10 | Disposition: A | Discharge status: Home / Self Care | Payer: Medicaid Other | Source: Ambulatory Visit | Attending: Surgery | Admitting: Surgery

## 2018-06-10 DIAGNOSIS — R1084 Generalized abdominal pain: Secondary | ICD-10-CM | POA: Diagnosis not present

## 2018-06-10 MED ORDER — IOHEXOL 300 MG/ML  SOLN
75.0000 mL | Freq: Once | INTRAMUSCULAR | Status: AC | PRN
  Administered 2018-06-10: 75 mL via INTRAVENOUS

## 2018-06-11 ENCOUNTER — Telehealth: Payer: Self-pay

## 2018-06-11 ENCOUNTER — Encounter: Payer: Self-pay | Admitting: *Deleted

## 2018-06-11 ENCOUNTER — Other Ambulatory Visit: Payer: Self-pay

## 2018-06-11 NOTE — Telephone Encounter (Signed)
Left message for patient to return call to office.   Per Dr.Pabon- Please let the pt know that her CT chest was normal. No new findings. She needs to keep her barium swallow and f/u appt w me 06/19/2018 @ 9:15am.

## 2018-06-11 NOTE — Telephone Encounter (Signed)
Notified patient as instructed, patient agrees. Patient aware to keep barium swallow and f/u appointment with Dr.Pabon

## 2018-06-13 ENCOUNTER — Ambulatory Visit
Admission: RE | Admit: 2018-06-13 | Discharge: 2018-06-13 | Disposition: A | Discharge status: Home / Self Care | Payer: Medicaid Other | Source: Ambulatory Visit | Attending: Surgery | Admitting: Surgery

## 2018-06-13 ENCOUNTER — Other Ambulatory Visit: Payer: Self-pay | Admitting: Surgery

## 2018-06-13 DIAGNOSIS — K449 Diaphragmatic hernia without obstruction or gangrene: Secondary | ICD-10-CM | POA: Diagnosis present

## 2018-06-14 ENCOUNTER — Other Ambulatory Visit (HOSPITAL_COMMUNITY)
Admission: RE | Admit: 2018-06-14 | Discharge: 2018-06-14 | Disposition: A | Discharge status: Home / Self Care | Payer: Medicaid Other | Source: Ambulatory Visit | Attending: Obstetrics & Gynecology | Admitting: Obstetrics & Gynecology

## 2018-06-14 ENCOUNTER — Encounter: Payer: Self-pay | Admitting: Obstetrics & Gynecology

## 2018-06-14 ENCOUNTER — Ambulatory Visit (INDEPENDENT_AMBULATORY_CARE_PROVIDER_SITE_OTHER): Payer: Medicaid Other | Admitting: Obstetrics & Gynecology

## 2018-06-14 VITALS — BP 120/80 | Ht 63.0 in | Wt 191.0 lb

## 2018-06-14 DIAGNOSIS — R102 Pelvic and perineal pain unspecified side: Secondary | ICD-10-CM

## 2018-06-14 DIAGNOSIS — G8929 Other chronic pain: Secondary | ICD-10-CM

## 2018-06-14 DIAGNOSIS — N946 Dysmenorrhea, unspecified: Secondary | ICD-10-CM | POA: Insufficient documentation

## 2018-06-14 DIAGNOSIS — N920 Excessive and frequent menstruation with regular cycle: Secondary | ICD-10-CM | POA: Diagnosis not present

## 2018-06-14 DIAGNOSIS — Z124 Encounter for screening for malignant neoplasm of cervix: Secondary | ICD-10-CM | POA: Insufficient documentation

## 2018-06-14 HISTORY — DX: Excessive and frequent menstruation with regular cycle: N92.0

## 2018-06-14 NOTE — Patient Instructions (Signed)
Pelvic Pain, Female Pelvic pain is pain in your lower abdomen, below your belly button and between your hips. The pain may start suddenly (be acute), keep coming back (be recurring), or last a long time (become chronic). Pelvic pain that lasts longer than 6 months is considered chronic. Pelvic pain may affect your:  Reproductive organs.  Urinary system.  Digestive tract.  Musculoskeletal system. There are many potential causes of pelvic pain. Sometimes, the pain can be a result of digestive or urinary conditions, strained muscles or ligaments, or reproductive conditions. Sometimes the cause of pelvic pain is not known. Follow these instructions at home:   Take over-the-counter and prescription medicines only as told by your health care provider.  Rest as told by your health care provider.  Do not have sex if it hurts.  Keep a journal of your pelvic pain. Write down: ? When the pain started. ? Where the pain is located. ? What seems to make the pain better or worse, such as food or your period (menstrual cycle). ? Any symptoms you have along with the pain.  Keep all follow-up visits as told by your health care provider. This is important. Contact a health care provider if:  Medicine does not help your pain.  Your pain comes back.  You have new symptoms.  You have abnormal vaginal discharge or bleeding, including bleeding after menopause.  You have a fever or chills.  You are constipated.  You have blood in your urine or stool.  You have foul-smelling urine.  You feel weak or light-headed. Get help right away if:  You have sudden severe pain.  Your pain gets steadily worse.  You have severe pain along with fever, nausea, vomiting, or excessive sweating.  You lose consciousness. Summary  Pelvic pain is pain in your lower abdomen, below your belly button and between your hips.  There are many potential causes of pelvic pain.  Keep a journal of your pelvic  pain. This information is not intended to replace advice given to you by your health care provider. Make sure you discuss any questions you have with your health care provider. Document Released: 02/29/2004 Document Revised: 09/19/2017 Document Reviewed: 09/19/2017 Elsevier Interactive Patient Education  2019 [employer].

## 2018-06-14 NOTE — Progress Notes (Signed)
Gynecology Pelvic Pain Evaluation   Chief Complaint  Patient presents with  . Abdominal Pain    6 months or more    History of Present Illness:   Patient is a 38 y.o. [id number] who LMP was Patient's last menstrual period was 06/02/2018., presents today for a problem visit.  She complains of pain.   Her pain is localized to the deep pelvis area, described as constant, sharp and stabbing, began several months ago and its severity is described as severe. The pain radiates to the  Non-radiating. She has these associated symptoms which include nausea and vomiting. Patient has these modifiers which include pain medication that make it better and unable to associate with any factor that make it worse.  Context includes: spontaneous and worse during cycles.  Cycles are 28-20days, last 5-7days, heaviest flow 3-4days.  Previous studies include upper and lower GI studies, found to have divertic and hiatal hernia. No recent GYN visit (2010).  Prior NSVD [phone]; s/p BTL, no desire for future fertility.  PMHx: She  has a past medical history of Anxiety, Asthma, Bipolar 1 disorder ([place]), Depression, Diverticulitis, GERD (gastroesophageal reflux disease), Hernia, abdominal, Ulcerative colitis, and Vaginal septum. Also,  has a past surgical history that includes Tubal ligation; Closed reduction metacarpal with percutaneous pinning (Left, 03/07/2018); Hardware Removal (Left, 04/03/2018); Esophagogastroduodenoscopy (egd) with propofol (N/A, 05/14/2018); and Colonoscopy with propofol (N/A, 05/14/2018)., family history includes Hypertension in her sister.,  reports that she has been smoking cigarettes. She has a 27.00 pack-year smoking history. She has never used smokeless tobacco. She reports current drug use. Drug: Marijuana. She reports that she does not drink alcohol.  She has a current medication list which includes the following prescription(s): buspirone, cyclobenzaprine, dicyclomine, gabapentin, guanfacine,  hydroxyzine, lamotrigine, lurasidone, melatonin, multivitamin with minerals, naproxen, pantoprazole, promethazine, propranolol, topiramate, trazodone, and albuterol. Also, has No Known Allergies.  Review of Systems  Constitutional: Positive for malaise/fatigue. Negative for chills and fever.  HENT: Negative for congestion, sinus pain and sore throat.   Eyes: Negative for blurred vision and pain.  Respiratory: Negative for cough and wheezing.   Cardiovascular: Negative for chest pain and leg swelling.  Gastrointestinal: Positive for abdominal pain, heartburn and nausea. Negative for constipation, diarrhea and vomiting.  Genitourinary: Negative for dysuria, frequency, hematuria and urgency.  Musculoskeletal: Positive for back pain and myalgias. Negative for joint pain and neck pain.  Skin: Negative for itching and rash.  Neurological: Negative for dizziness, tremors and weakness.  Endo/Heme/Allergies: Does not bruise/bleed easily.  Psychiatric/Behavioral: Negative for depression. The patient is nervous/anxious. The patient does not have insomnia.    Objective: BP 120/80   Ht 5' 3"  (1.6 m)   Wt 191 lb (86.6 kg)   LMP 06/02/2018   BMI 33.83 kg/m  Physical Exam Constitutional:      General: She is not in acute distress.    Appearance: She is well-developed.  Genitourinary:     Pelvic exam was performed with patient supine.     Vagina, uterus and rectum normal.     No lesions in the vagina.     No vaginal bleeding.     No cervical motion tenderness, friability, lesion or polyp.     Uterus is mobile.     Uterus is not enlarged.     No uterine mass detected.    Uterus is midaxial.     No right or left adnexal mass present.     Right adnexa not tender.  Left adnexa not tender.  HENT:     Head: Normocephalic and atraumatic. No laceration.     Right Ear: Hearing normal.     Left Ear: Hearing normal.     Mouth/Throat:     Pharynx: Uvula midline.  Eyes:     Pupils: Pupils are  equal, round, and reactive to light.  Neck:     Musculoskeletal: Normal range of motion and neck supple.     Thyroid: No thyromegaly.  Cardiovascular:     Rate and Rhythm: Normal rate and regular rhythm.     Heart sounds: No murmur. No friction rub. No gallop.   Pulmonary:     Effort: Pulmonary effort is normal. No respiratory distress.     Breath sounds: Normal breath sounds. No wheezing.  Chest:     Breasts:        Right: No mass, skin change or tenderness.        Left: No mass, skin change or tenderness.  Abdominal:     General: Bowel sounds are normal. There is no distension.     Palpations: Abdomen is soft.     Tenderness: There is no abdominal tenderness. There is no rebound.  Musculoskeletal: Normal range of motion.  Neurological:     Mental Status: She is alert and oriented to person, place, and time.     Cranial Nerves: No cranial nerve deficit.  Skin:    General: Skin is warm and dry.  Psychiatric:        Judgment: Judgment normal.  Vitals signs reviewed.   Female chaperone present for pelvic portion of the physical exam  Assessment: 38 y.o. K2H0623 with female pain, new onset this year, desire for diagnosis and treatment.  1. Chronic pelvic pain in female - US PELVIS TRANSVANGINAL NON-OB (TV ONLY) planned  2. Screening for cervical cancer - Cytology - PAP- today  3. Dysmenorrhea  4. Menorrhagia with regular cycle  Plan pelvic US followed by discussion of results and plan based on results.  Consider hormonal (progesterone, smoker) control of periods and thus dysmenorrhea and potential mid cycle pains as well.  Surgery such as hysterectomy discussed.  Barnett Applebaum, MD, [facility], [facility] 06/14/2018  10:24 AM

## 2018-06-17 LAB — CYTOLOGY - PAP
Diagnosis: NEGATIVE
HPV: NOT DETECTED

## 2018-06-18 ENCOUNTER — Ambulatory Visit: Payer: Medicaid Other | Admitting: Gastroenterology

## 2018-06-19 ENCOUNTER — Encounter
Admission: RE | Disposition: A | Discharge status: Home / Self Care | Payer: Self-pay | Source: Home / Self Care | Attending: Surgery

## 2018-06-19 ENCOUNTER — Ambulatory Visit: Payer: Medicaid Other | Admitting: Anesthesiology

## 2018-06-19 ENCOUNTER — Other Ambulatory Visit: Payer: Self-pay | Admitting: Obstetrics & Gynecology

## 2018-06-19 ENCOUNTER — Other Ambulatory Visit: Payer: Self-pay

## 2018-06-19 ENCOUNTER — Encounter: Payer: Self-pay | Admitting: *Deleted

## 2018-06-19 ENCOUNTER — Ambulatory Visit (INDEPENDENT_AMBULATORY_CARE_PROVIDER_SITE_OTHER): Payer: Medicaid Other | Admitting: Surgery

## 2018-06-19 ENCOUNTER — Ambulatory Visit
Admission: RE | Admit: 2018-06-19 | Discharge: 2018-06-19 | Disposition: A | Discharge status: Home / Self Care | Payer: Medicaid Other | Attending: Surgery | Admitting: Surgery

## 2018-06-19 ENCOUNTER — Encounter: Payer: Self-pay | Admitting: Surgery

## 2018-06-19 VITALS — BP 126/89 | HR 92 | Temp 97.9°F | Resp 16 | Ht 63.0 in | Wt 190.2 lb

## 2018-06-19 DIAGNOSIS — F419 Anxiety disorder, unspecified: Secondary | ICD-10-CM | POA: Diagnosis not present

## 2018-06-19 DIAGNOSIS — N920 Excessive and frequent menstruation with regular cycle: Secondary | ICD-10-CM

## 2018-06-19 DIAGNOSIS — R102 Pelvic and perineal pain: Principal | ICD-10-CM

## 2018-06-19 DIAGNOSIS — F319 Bipolar disorder, unspecified: Secondary | ICD-10-CM | POA: Insufficient documentation

## 2018-06-19 DIAGNOSIS — M24542 Contracture, left hand: Secondary | ICD-10-CM | POA: Insufficient documentation

## 2018-06-19 DIAGNOSIS — K449 Diaphragmatic hernia without obstruction or gangrene: Secondary | ICD-10-CM

## 2018-06-19 DIAGNOSIS — Z8781 Personal history of (healed) traumatic fracture: Secondary | ICD-10-CM | POA: Diagnosis not present

## 2018-06-19 DIAGNOSIS — G8929 Other chronic pain: Secondary | ICD-10-CM

## 2018-06-19 DIAGNOSIS — K219 Gastro-esophageal reflux disease without esophagitis: Secondary | ICD-10-CM | POA: Diagnosis not present

## 2018-06-19 DIAGNOSIS — Z79899 Other long term (current) drug therapy: Secondary | ICD-10-CM | POA: Insufficient documentation

## 2018-06-19 HISTORY — PX: TRIGGER FINGER RELEASE: SHX641

## 2018-06-19 LAB — POCT PREGNANCY, URINE: Preg Test, Ur: NEGATIVE

## 2018-06-19 SURGERY — RELEASE, A1 PULLEY, FOR TRIGGER FINGER
Anesthesia: General | Site: Hand | Laterality: Left

## 2018-06-19 MED ORDER — LACTATED RINGERS IV SOLN
INTRAVENOUS | Status: DC
  Administered 2018-06-19: 12:00:00 via INTRAVENOUS

## 2018-06-19 MED ORDER — FENTANYL CITRATE (PF) 100 MCG/2ML IJ SOLN
INTRAMUSCULAR | Status: DC | PRN
  Administered 2018-06-19 (×2): 25 ug via INTRAVENOUS
  Administered 2018-06-19: 50 ug via INTRAVENOUS

## 2018-06-19 MED ORDER — POTASSIUM CHLORIDE IN NACL 20-0.9 MEQ/L-% IV SOLN: INTRAVENOUS | Status: DC

## 2018-06-19 MED ORDER — OXYCODONE HCL 5 MG PO TABS: 5.0000 mg | ORAL_TABLET | ORAL | 0 refills | Status: DC | PRN

## 2018-06-19 MED ORDER — GLYCOPYRROLATE 0.2 MG/ML IJ SOLN
INTRAMUSCULAR | Status: DC | PRN
  Administered 2018-06-19: 0.1 mg via INTRAVENOUS

## 2018-06-19 MED ORDER — FENTANYL CITRATE (PF) 100 MCG/2ML IJ SOLN: 25.0000 ug | INTRAMUSCULAR | Status: DC | PRN

## 2018-06-19 MED ORDER — ONDANSETRON HCL 4 MG/2ML IJ SOLN: 4.0000 mg | Freq: Four times a day (QID) | INTRAMUSCULAR | Status: DC | PRN

## 2018-06-19 MED ORDER — ONDANSETRON HCL 4 MG PO TABS: 4.0000 mg | ORAL_TABLET | Freq: Four times a day (QID) | ORAL | Status: DC | PRN

## 2018-06-19 MED ORDER — OXYCODONE HCL 5 MG PO TABS: 5.0000 mg | ORAL_TABLET | ORAL | Status: DC | PRN

## 2018-06-19 MED ORDER — ONDANSETRON HCL 4 MG/2ML IJ SOLN
INTRAMUSCULAR | Status: DC | PRN
  Administered 2018-06-19: 4 mg via INTRAVENOUS

## 2018-06-19 MED ORDER — MIDAZOLAM HCL 5 MG/5ML IJ SOLN
INTRAMUSCULAR | Status: DC | PRN
  Administered 2018-06-19: 2 mg via INTRAVENOUS

## 2018-06-19 MED ORDER — METOCLOPRAMIDE HCL 5 MG PO TABS: 5.0000 mg | ORAL_TABLET | Freq: Three times a day (TID) | ORAL | Status: DC | PRN

## 2018-06-19 MED ORDER — BUPIVACAINE HCL (PF) 0.5 % IJ SOLN
INTRAMUSCULAR | Status: DC | PRN
  Administered 2018-06-19: 5 mL

## 2018-06-19 MED ORDER — LIDOCAINE HCL (CARDIAC) PF 100 MG/5ML IV SOSY
PREFILLED_SYRINGE | INTRAVENOUS | Status: DC | PRN
  Administered 2018-06-19: 40 mg via INTRATRACHEAL

## 2018-06-19 MED ORDER — PROPOFOL 10 MG/ML IV BOLUS
INTRAVENOUS | Status: DC | PRN
  Administered 2018-06-19: 140 mg via INTRAVENOUS

## 2018-06-19 MED ORDER — OXYCODONE HCL 5 MG/5ML PO SOLN: 5.0000 mg | Freq: Once | ORAL | Status: AC | PRN

## 2018-06-19 MED ORDER — CEFAZOLIN SODIUM-DEXTROSE 2-4 GM/100ML-% IV SOLN
2.0000 g | Freq: Once | INTRAVENOUS | Status: AC
  Administered 2018-06-19: 2 g via INTRAVENOUS

## 2018-06-19 MED ORDER — METOCLOPRAMIDE HCL 5 MG/ML IJ SOLN: 5.0000 mg | Freq: Three times a day (TID) | INTRAMUSCULAR | Status: DC | PRN

## 2018-06-19 MED ORDER — OXYCODONE HCL 5 MG PO TABS
5.0000 mg | ORAL_TABLET | Freq: Once | ORAL | Status: AC | PRN
  Administered 2018-06-19: 5 mg via ORAL

## 2018-06-19 MED ORDER — DEXAMETHASONE SODIUM PHOSPHATE 4 MG/ML IJ SOLN
INTRAMUSCULAR | Status: DC | PRN
  Administered 2018-06-19: 4 mg via INTRAVENOUS

## 2018-06-19 SURGICAL SUPPLY — 30 items
BANDAGE ELASTIC 2 LF NS (GAUZE/BANDAGES/DRESSINGS) ×3 IMPLANT
BANDAGE ELASTIC 3 LF NS (GAUZE/BANDAGES/DRESSINGS) ×2 IMPLANT
BNDG CMPR MED 5X2 ELC HKLP NS (GAUZE/BANDAGES/DRESSINGS) ×1
BNDG CMPR MED 5X3 ELC HKLP NS (GAUZE/BANDAGES/DRESSINGS) ×1
BNDG ESMARK 4X12 TAN STRL LF (GAUZE/BANDAGES/DRESSINGS) ×3 IMPLANT
CHLORAPREP W/TINT 26ML (MISCELLANEOUS) ×3 IMPLANT
CORD BIP STRL DISP 12FT (MISCELLANEOUS) ×3 IMPLANT
COVER LIGHT HANDLE UNIVERSAL (MISCELLANEOUS) ×6 IMPLANT
CUFF TOURN SGL QUICK 18X4 (TOURNIQUET CUFF) ×2 IMPLANT
DECANTER SPIKE VIAL GLASS SM (MISCELLANEOUS) ×3 IMPLANT
DRAPE SURG 17X11 SM STRL (DRAPES) ×2 IMPLANT
GAUZE PETRO XEROFOAM 1X8 (MISCELLANEOUS) ×3 IMPLANT
GAUZE SPONGE 4X4 12PLY STRL (GAUZE/BANDAGES/DRESSINGS) ×3 IMPLANT
GLOVE BIO SURGEON STRL SZ8 (GLOVE) ×6 IMPLANT
GLOVE INDICATOR 8.0 STRL GRN (GLOVE) ×5 IMPLANT
GOWN STRL REUS W/ TWL LRG LVL3 (GOWN DISPOSABLE) ×1 IMPLANT
GOWN STRL REUS W/ TWL XL LVL3 (GOWN DISPOSABLE) ×1 IMPLANT
GOWN STRL REUS W/TWL LRG LVL3 (GOWN DISPOSABLE) ×3
GOWN STRL REUS W/TWL XL LVL3 (GOWN DISPOSABLE) ×3
KIT TURNOVER KIT A (KITS) ×3 IMPLANT
NS IRRIG 500ML POUR BTL (IV SOLUTION) ×3 IMPLANT
PACK EXTREMITY ARMC (MISCELLANEOUS) ×3 IMPLANT
PADDING CAST 3IN STRL (MISCELLANEOUS) ×2
PADDING CAST BLEND 3X4 STRL (MISCELLANEOUS) IMPLANT
SPLINT PLASTER CAST XFAST 3X15 (CAST SUPPLIES) IMPLANT
SPLINT PLASTER XTRA FASTSET 3X (CAST SUPPLIES) ×2
STOCKINETTE IMPERVIOUS 9X36 MD (GAUZE/BANDAGES/DRESSINGS) ×3 IMPLANT
SUT PROLENE 4 0 PS 2 18 (SUTURE) ×3 IMPLANT
SUT VIC AB 2-0 SH 27 (SUTURE) ×3
SUT VIC AB 2-0 SH 27XBRD (SUTURE) IMPLANT

## 2018-06-19 NOTE — Progress Notes (Signed)
Patient's surgery to be scheduled for 07-04-18 at [facility] with Dr. Dahlia Byes.  The patient is aware she will be contacted by the [facility] to complete a phone interview sometime in the near future.  The patient is aware to call the office should he/she have further questions.

## 2018-06-19 NOTE — Op Note (Signed)
06/19/2018  2:23 PM  Patient:   Victoria Pierce  Pre-Op Diagnosis:   Extension contracture left little MCP joint status post close reduction and pinning of closed displaced left metacarpal neck fracture.  Post-Op Diagnosis:   Same  Procedure:   Open left little MCP joint release with extensor tenolysis and release of radial and ulnar collateral ligaments.  Surgeon:   Pascal Lux, MD  Assistant:   None  Anesthesia:   General LMA  Findings:   As above.  Complications:   None  Fluids:   600 cc crystalloid  EBL:   1 cc  UOP:   None  TT:   41 minutes at 250 mmHg  Drains:   None  Closure:   4-0 Prolene interrupted sutures  Brief Clinical Note:   The patient is a 38 year old female who is now 3.5 months status post a closed reduction and percutaneous pinning of her closed displaced left fifth metacarpal neck fracture.  The pins were removed 4 weeks later.  Despite extensive occupational therapy, the patient has had difficulty regaining any significant flexion of the MCP joint.  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  She presents at this time for open capsular release of the left little MCP joint.  Procedure:   The patient was brought into the operating room and lain in the supine position.  After adequate general laryngeal mask anesthesia was obtained, the patient's left hand and upper extremity were prepped with ChloraPrep solution before being draped sterilely.  Preoperative antibiotics were administered.  A timeout was performed to verify the appropriate surgical site before the limb was exsanguinated with an Esmarch and a tourniquet inflated to 250 mmHg.  Finger motion was assessed at this point.  The MCP joint could be flexed only to 30 degrees, but could be extended to 15 to 20 degrees beyond neutral.  A curvilinear incision was made over the ulnar side of the dorsum of the left little MCP joint.  The incision  was carried down through the subcutaneous tissues to expose the extensor hood.  Extensor tendon was identified and the proximal portions of both sagittal bands were released to provide access to the MCP joint.  The space between the extensor tendon and the dorsal capsule was developed and found to be relatively free of adhesions.  A freer elevator was passed proximally and distally to ensure that the tendon was freely mobile.  The dorsal capsule was entered and released beginning at the dorsal aspect of the metacarpal head.  The capsule was at least 1.5 mm thick and therefore was excised to the base of the proximal phalanx.    An attempt was made to flex the finger, but flexion was still limited to approximately 30 to 35 degrees.  Therefore, the radial and ulnar collateral ligaments were released off their metacarpal head attachments using a 15 blade and Freer elevators.  In addition, the remaining portion of the ulnar sagittal band was released.  Finally, a Soil scientist was passed through the joint to release any adhesions between the volar plate and the metacarpal head volarly.  Following these maneuvers, the MCP joint could be flexed to 90 degrees.  The wound was copiously irrigated with sterile saline solution before the wound was reapproximated using 4-0 Prolene interrupted sutures.  A total of 5 cc of 0.5% plain Sensorcaine was injected in around the incision site to help with postoperative analgesia before the ring and  little fingers were buddy taped.  An ulnar gutter splint was applied maintaining the ring and little MCP joints in 90 degrees of flexion while maintaining the PIP and DIP joints in extension.  The patient was then awakened, extubated, and returned to the recovery room in satisfactory condition after tolerating the procedure well.

## 2018-06-19 NOTE — H&P (Signed)
Paper H&P to be scanned into permanent record. H&P reviewed and patient re-examined. No changes. 

## 2018-06-19 NOTE — Discharge Instructions (Addendum)
General Anesthesia, Adult, Care After This sheet gives you information about how to care for yourself after your procedure. Your health care provider may also give you more specific instructions. If you have problems or questions, contact your health care provider. What can I expect after the procedure? After the procedure, the following side effects are common:  Pain or discomfort at the IV site.  Nausea.  Vomiting.  Sore throat.  Trouble concentrating.  Feeling cold or chills.  Weak or tired.  Sleepiness and fatigue.  Soreness and body aches. These side effects can affect parts of the body that were not involved in surgery. Follow these instructions at home:  For at least 24 hours after the procedure:  Have a responsible adult stay with you. It is important to have someone help care for you until you are awake and alert.  Rest as needed.  Do not: ? Participate in activities in which you could fall or become injured. ? Drive. ? Use heavy machinery. ? Drink alcohol. ? Take sleeping pills or medicines that cause drowsiness. ? Make important decisions or sign legal documents. ? Take care of children on your own. Eating and drinking  Follow any instructions from your health care provider about eating or drinking restrictions.  When you feel hungry, start by eating small amounts of foods that are soft and easy to digest (bland), such as toast. Gradually return to your regular diet.  Drink enough fluid to keep your urine pale yellow.  If you vomit, rehydrate by drinking water, juice, or clear broth. General instructions  If you have sleep apnea, surgery and certain medicines can increase your risk for breathing problems. Follow instructions from your health care provider about wearing your sleep device: ? Anytime you are sleeping, including during daytime naps. ? While taking prescription pain medicines, sleeping medicines, or medicines that make you drowsy.  Return to  your normal activities as told by your health care provider. Ask your health care provider what activities are safe for you.  Take over-the-counter and prescription medicines only as told by your health care provider.  If you smoke, do not smoke without supervision.  Keep all follow-up visits as told by your health care provider. This is important. Contact a health care provider if:  You have nausea or vomiting that does not get better with medicine.  You cannot eat or drink without vomiting.  You have pain that does not get better with medicine.  You are unable to pass urine.  You develop a skin rash.  You have a fever.  You have redness around your IV site that gets worse. Get help right away if:  You have difficulty breathing.  You have chest pain.  You have blood in your urine or stool, or you vomit blood. Summary  After the procedure, it is common to have a sore throat or nausea. It is also common to feel tired.  Have a responsible adult stay with you for the first 24 hours after general anesthesia. It is important to have someone help care for you until you are awake and alert.  When you feel hungry, start by eating small amounts of foods that are soft and easy to digest (bland), such as toast. Gradually return to your regular diet.  Drink enough fluid to keep your urine pale yellow.  Return to your normal activities as told by your health care provider. Ask your health care provider what activities are safe for you. This information is not  intended to replace advice given to you by your health care provider. Make sure you discuss any questions you have with your health care provider. Document Released: 07/10/2000 Document Revised: 11/17/2016 Document Reviewed: 11/17/2016 Elsevier Interactive Patient Education  2019 [place] discharge instructions: Keep splint dry and intact. Keep hand elevated above heart level. Apply ice to affected area  frequently. Take Aleve 2 tablets BID OR ibuprofen 600-800 mg TID with meals for 7-10 days, then as necessary. Take pain medication as prescribed or ES Tylenol when needed.  Return for follow-up in 10-14 days or as scheduled.

## 2018-06-19 NOTE — Anesthesia Procedure Notes (Signed)
Procedure Name: LMA Insertion Date/Time: 06/19/2018 1:11 PM Performed by: Mayme Genta, CRNA Pre-anesthesia Checklist: Patient identified, Emergency Drugs available, Suction available, Timeout performed and Patient being monitored Patient Re-evaluated:Patient Re-evaluated prior to induction Oxygen Delivery Method: Circle system utilized Preoxygenation: Pre-oxygenation with 100% oxygen Induction Type: IV induction LMA: LMA inserted LMA Size: 4.0 Number of attempts: 1 Placement Confirmation: positive ETCO2 and breath sounds checked- equal and bilateral Tube secured with: Tape

## 2018-06-19 NOTE — Transfer of Care (Signed)
Immediate Anesthesia Transfer of Care Note  Patient: Victoria Pierce  Procedure(s) Performed: EXTENSOR TENOLYSIS WITH CAPSULAR RELEASE OF LEFT LITTLE MCP JOINT (Left Hand)  Patient Location: PACU  Anesthesia Type: General LMA  Level of Consciousness: awake, alert  and patient cooperative  Airway and Oxygen Therapy: Patient Spontanous Breathing and Patient connected to supplemental oxygen  Post-op Assessment: Post-op Vital signs reviewed, Patient's Cardiovascular Status Stable, Respiratory Function Stable, Patent Airway and No signs of Nausea or vomiting  Post-op Vital Signs: Reviewed and stable  Complications: No apparent anesthesia complications

## 2018-06-19 NOTE — Progress Notes (Signed)
Surgical Consultation  06/21/2018  Victoria Pierce is an 38 y.o. female.   Chief Complaint  Patient presents with  . Follow-up    Discuss BE /CT scan     HPI: Victoria Pierce is a 38 year old female being follow-up for recalcitrant reflux symptoms.  She does report some retrosternal pain that gets worsen when he lays on her side.  The pain is intermittent.  She has had cardiac work-up with has been negative so far.  She did have an EGD showing the hiatal hernia.  I subsequently performed a CT scan of the abdomen and pelvis as well as swallow study.  Please note that I have personally review both of the studies.  There is evidence of reflux on the swallowing study with a small hernia.  On the CT scan there is no evidence of a hiatal hernia.  There is no evidence of other intra-acute intra-abdominal abnormalities.  He continues to smoke but has cut down significantly.  I have had a lengthy discussion regarding the importance of smoking cessation for better outcomes and decreased perioperative complications.  She acknowledges this. He did see GYN and currently is being work-up for pelvic pain   Past Medical History:  Diagnosis Date  . Anxiety   . Asthma   . Bipolar 1 disorder ([place])   . Depression   . Diverticulitis   . GERD (gastroesophageal reflux disease)   . Hernia, abdominal   . Ulcerative colitis   . Vaginal septum     Past Surgical History:  Procedure Laterality Date  . CLOSED REDUCTION METACARPAL WITH PERCUTANEOUS PINNING Left 03/07/2018   Procedure: CLOSED REDUCTION METACARPAL WITH PERCUTANEOUS PINNING-5th metacarpal;  Surgeon: Corky Mull, MD;  Location: [facility] ORS;  Service: Orthopedics;  Laterality: Left;  . COLONOSCOPY WITH PROPOFOL N/A 05/14/2018   Procedure: COLONOSCOPY WITH PROPOFOL;  Surgeon: Lin Landsman, MD;  Location: [facility] ENDOSCOPY;  Service: Gastroenterology;  Laterality: N/A;  . ESOPHAGOGASTRODUODENOSCOPY (EGD) WITH PROPOFOL N/A 05/14/2018   Procedure:  ESOPHAGOGASTRODUODENOSCOPY (EGD) WITH PROPOFOL;  Surgeon: Lin Landsman, MD;  Location: [facility] ENDOSCOPY;  Service: Gastroenterology;  Laterality: N/A;  . HARDWARE REMOVAL Left 04/03/2018   Procedure: LEFT HAND FIFTH METACARPAL PIN REMOVAL;  Surgeon: Corky Mull, MD;  Location: [facility];  Service: Orthopedics;  Laterality: Left;  . TRIGGER FINGER RELEASE Left 06/19/2018   Procedure: EXTENSOR TENOLYSIS WITH CAPSULAR RELEASE OF LEFT LITTLE MCP JOINT;  Surgeon: Corky Mull, MD;  Location: [facility];  Service: Orthopedics;  Laterality: Left;  . TUBAL LIGATION      Family History  Problem Relation Age of Onset  . Hypertension Sister     Social History:  reports that she has been smoking cigarettes. She has a 27.00 pack-year smoking history. She has never used smokeless tobacco. She reports current drug use. Drug: Marijuana. She reports that she does not drink alcohol.  Allergies: No Known Allergies  Medications reviewed.     ROS Full ROS performed and is otherwise negative other than what is stated in the HPI    BP 126/89   Pulse 92   Temp 97.9 F (36.6 C) (Temporal)   Resp 16   Ht 5' 3"  (1.6 m)   Wt 190 lb 3.2 oz (86.3 kg)   LMP 06/19/2018   SpO2 98%   BMI 33.69 kg/m   Physical Exam Vitals signs and nursing note reviewed.  Constitutional:      Appearance: Normal appearance. She is normal weight.  Neck:  Musculoskeletal: Normal range of motion and neck supple. No neck rigidity or muscular tenderness.  Cardiovascular:     Rate and Rhythm: Normal rate and regular rhythm.     Heart sounds: No murmur.  Pulmonary:     Effort: Pulmonary effort is normal.     Breath sounds: Normal breath sounds. No stridor.  Abdominal:     General: Abdomen is flat. There is no distension.     Palpations: There is no mass.     Tenderness: There is no abdominal tenderness.     Hernia: No hernia is present.  Musculoskeletal: Normal range of motion.         General: No swelling.  Skin:    General: Skin is warm and dry.     Capillary Refill: Capillary refill takes less than 2 seconds.     Coloration: Skin is not jaundiced.  Neurological:     General: No focal deficit present.     Mental Status: She is alert and oriented to person, place, and time.  Psychiatric:        Mood and Affect: Mood normal.        Behavior: Behavior normal.        Thought Content: Thought content normal.        Judgment: Judgment normal.      Assessment/Plan: 38 yo Female with recalcitrant reflux symptoms and a small hiatal hernia.  I had an extensive discussion with the patient regarding her symptoms.  I do believe that some of the symptoms are caused by the reflux.  Some of the other symptoms may not be necessarily caused by the reflux.  I specifically discussed with her that the reflux will improve and the symptoms that are associated with reflux will do as well.  I could not guarantee that all her abdominal complaints will be completely solve.  We had an extensive discussion regarding expectations on the rationale for fundoplication and hiatal hernia repair.  I have discussed with the patient in detail about the procedure.  Risk, benefits and possible complications including but not limited to: Bleeding, infection, esophageal injuries, pulmonary issues, recurrence and persistent symptoms. He understands and she wishes to proceed.  The questions were answered. Note that I spent 40 minutes in this encounter with greater than 50% spent in coordination and counseling of her care    Caroleen Hamman, MD [place] [facility]

## 2018-06-19 NOTE — Anesthesia Postprocedure Evaluation (Signed)
Anesthesia Post Note  Patient: Victoria Pierce  Procedure(s) Performed: EXTENSOR TENOLYSIS WITH CAPSULAR RELEASE OF LEFT LITTLE MCP JOINT (Left Hand)  Patient location during evaluation: PACU Anesthesia Type: General Level of consciousness: awake and alert Pain management: pain level controlled Vital Signs Assessment: post-procedure vital signs reviewed and stable Respiratory status: spontaneous breathing Cardiovascular status: stable Anesthetic complications: no    Preesha Benjamin, III,  Ilene Witcher D

## 2018-06-19 NOTE — Patient Instructions (Signed)
We have spoken today about repairing your Hiatal Hernia. Your surgery has been scheduled  with Dr. Caroleen Hamman.  Plan to be in the hospital for 2-3 days if the minimally invasive surgery is completed without having to make a bigger incision. If the bigger incision is made, you will most likely need to be in the hospital 7-10 days. You will be on a liquid diet and need to recover for 2 weeks following your surgery prior to doing any of your normal activities. At the 2 week mark, we will see you in the office and if you are doing ok we will advance your diet and activity level as you tolerate.    Please call our office with any questions or concerns that you have regarding your surgery and recovery.     Laparoscopic Nissen Fundoplication Laparoscopic Nissen fundoplication is surgery to relieve heartburn and other problems caused by gastric fluids flowing up into your esophagus. The esophagus is the tube that carries food and liquid from your throat to your stomach. Normally, the muscle that sits between your stomach and your esophagus (lower esophageal sphincter or LES) keeps stomach fluids in your stomach. In some people, the LES does not work properly, and stomach fluids flow up into the esophagus. This can happen when part of the stomach bulges through the LES (hiatal hernia). The backward flow of stomach fluids can cause a type of severe and long-standing heartburn that is called gastroesophageal reflux disease (GERD). You may need this surgery if other treatments for GERD have not helped. In a laparoscopic Nissen fundoplication, the upper part of your stomach is wrapped around the lower part of your esophagus to strengthen the LES and prevent reflux. If you have a hiatal hernia, it will also be repaired with this surgery. The procedure is done through several small incisions in your abdomen. It is performed using a thin, telescopic instrument (laparoscope) and other instruments that can pass through  the scope or through other small incisions. Tell a health care provider about:  Any allergies you have.  All medicines you are taking, including vitamins, herbs, eye drops, creams, and over-the-counter medicines.  Any problems you or family members have had with anesthetic medicines.  Any blood disorders you have.  Any surgeries you have had.  Any medical conditions you have. What are the risks? Generally, this is a safe procedure. However, problems may occur, including:  Difficulty swallowing (dysphagia).  Bloating.  Nausea or vomiting.  Damage to the lung, causing a collapsed lung.  Infection or bleeding. What happens before the procedure?  Ask your health care provider about:  Changing or stopping your regular medicines. This is especially important if you are taking diabetes medicines or blood thinners.  Taking medicines such as aspirin and ibuprofen. These medicines can thin your blood. Do not take these medicines before your procedure if your health care provider asks you not to.  Follow your health care provider's instructions about eating or drinking restrictions.  Plan to have someone take you home after the procedure. What happens during the procedure?  An IV tube will be inserted into one of your veins. It will be used to give you fluids and medicines during the procedure.  You will be given a medicine that makes you fall asleep (general anesthetic).  Your abdomen will be cleaned with a germ-killing solution (antiseptic).  The surgeon will make a small incision in your abdomen and insert a tube through the incision.  Your abdomen will be  filled with a gas. This helps the surgeon to see your organs more easily and it makes more space to work.  The surgeon will insert the laparoscope through the incision. The scope has a camera that will send pictures to a monitor in the operating room.  The surgeon will make several other small incisions in your abdomen to  insert the other instruments that are needed during the procedure.  Another instrument (dilator) will be passed through your mouth and down your esophagus into the upper part of your stomach. The dilator will prevent your LES from being closed too tightly during surgery.  The surgeon will pass the top portion of your stomach behind the lower part of your esophagus and wrap it all the way around. This will be stitched into place.  If you have a hiatal hernia, it will be repaired during this procedure.  All instruments will be removed, and your incisions will be closed under your skin with stitches (sutures). Skin adhesive strips may also be used.  A bandage (dressing) will be placed on your skin over the incisions. The procedure may vary among health care providers and hospitals. What happens after the procedure?  You will be moved to a recovery area.  Your blood pressure, heart rate, breathing rate, and blood oxygen level will be monitored often until the medicines you were given have worn off.  You will be given pain medicine as needed.  Your IV tube will be kept in until you are able to drink fluids. This information is not intended to replace advice given to you by your health care provider. Make sure you discuss any questions you have with your health care provider. Document Released: 04/24/2014 Document Revised: 09/09/2015 Document Reviewed: 12/03/2013 Elsevier Interactive Patient Education  2017 [employer]   Laparoscopic Nissen Fundoplication, Care After Refer to this sheet in the next few weeks. These instructions provide you with information about caring for yourself after your procedure. Your health care provider may also give you more specific instructions. Your treatment has been planned according to current medical practices, but problems sometimes occur. Call your health care provider if you have any problems or questions after your procedure. What can I expect after the  procedure? After the procedure, it is common to have:  Difficulty swallowing (dysphagia).  Excess gas (bloating). Follow these instructions at home: Medicines   Take medicines only as directed by your health care provider.  Do not drive or operate heavy machinery while taking pain medicine. Incision care   There are many different ways to close and cover an incision, including stitches (sutures), skin glue, and adhesive strips. Follow your health care provider's instructions about:  Incision care.  Bandage (dressing) changes and removal.  Incision closure removal.  Check your incision areas every day for signs of infection. Watch for:  Redness, swelling, or pain.  Fluid, blood, or pus.  Do not take baths, swim, or use a hot tub until your health care provider approves. Take showers as directed by your health care provider. Eating and drinking   Follow your health care provider's instructions about eating.  You may need to follow a liquid-only diet for 2 weeks, followed by a diet of soft foods for 2 weeks.  You should return to your usual diet gradually.  Drink enough fluid to keep your urine clear or pale yellow. Activity   Return to your normal activities as directed by your health care provider. Ask your health care provider what activities  are safe for you.  Avoid strenuous exercise.  Do not lift anything that is heavier than 10 lb (4.5 kg).  Ask your health care provider when you can:  Return to sexual activity.  Drive.  Go back to work. Contact a health care provider if:  You have a fever.  Your pain gets worse or is not helped by medicine.  You have frequent nausea or vomiting.  You have continued abdominal bloating.  You have an ongoing (persistent) cough.  You have redness, swelling, or pain in any incision areas.  You have fluid, blood, or pus coming from any incisions. Get help right away if:  You have trouble breathing.  You are  unable to swallow.  You have persistent vomiting.  You have blood in your vomit.  You have severe abdominal pain. This information is not intended to replace advice given to you by your health care provider. Make sure you discuss any questions you have with your health care provider. Document Released: 11/25/2003 Document Revised: 09/09/2015 Document Reviewed: 12/03/2013 Elsevier Interactive Patient Education  2017 [place].   Diet After Nissen Fundoplication Surgery This diet information is for patients who have recently had Nissen fundoplication surgery to correct reflux disease or to repair various types of hernias, such as hiatal hernia and intrathoracic stomach. This diet may also be used for other gastrointestinal surgeries, such as Heller myotomy and repair of achalasia. The diet will help control diarrhea, excess gas and swallowing problems, which may occur after this type of surgery. Keeping Your Stomach from Stretching Eat small, frequent meals (six to eight per day). This will help you consume the majority of the nutrients you need without causing your stomach to feel full or distended.  Drinking large amounts of fluids with meals can stretch your stomach. You may drink fluids between meals as often as you like, but limit fluids to 1/2 cup (4 fluid ounces) with meals and one cup (8 fluid ounces) with snacks.  Sit upright while eating and stay upright for 30 minutes after each meal. Gravity can help food move through your digestive tract. Do not lie down after eating. Sit upright for 2 hours after your last meal or snack of the day.  Eat very slowly. Take your time when eating.  Take small bites and chew your food well to help aid in swallowing and digestion.  Avoid crusty breads and sticky, gummy foods, such as bananas, fresh doughy breads, rolls and doughnuts. These types of foods become sticky and difficult to swallow.  Toasted breads tend to be better tolerated.  Lastly, if  you eat sweets, consume them at the end of your meal to avoid a group of symptoms referred to as "dumping syndrome". This describes the rapid emptying of foods from the stomach to the small intestine. Sweetened beverages, candy and desserts move more rapidly and dump quickly into the intestines. This can cause symptoms of nausea, weakness, cold sweats, cramps, diarrhea and dizzy spells.  Avoiding Gas Avoid drinking through a straw. Do not chew gum or tobacco. These actions cause you to swallow air, which produces excess gas in your stomach. Chew with your mouth closed.  Avoid any foods that cause stomach gas and distention. These foods include corn, dried beans, peas, lentils, onions, broccoli, cauliflower and any food from the cabbage family.  Avoid carbonated drinks, alcohol, citrus and tomato products.  When will I be able to eat a soft diet? After Nissen fundoplication surgery, your diet will be advanced  slowly by your surgeon. Generally, you will be on a clear liquid diet for the first few meals. Then you will advance to the full liquid diet for a meal or two and eventually to a Nissen soft diet. Please be aware that each patient's tolerance to food is different. Your doctor will advance your diet depending on how well you progress after surgery. Clear Liquid Diet  The first diet after surgery is the clear liquid diet. It includes the following liquids: Apple juice  Cranberry juice  Grape juice  Chicken broth  Beef broth  Flavored gelatin (Jell-O)  Decaf tea and coffee  Caffeinated beverages are permitted based on tolerance  Popsicles  [place] ice Carbonated drinks (sodas) are not allowed for the first six to eight weeks after surgery. After this time you can try them again in small amounts.  Full Liquid Diet The full liquid diet contains anything on the clear liquid diet, plus: Milk, soy, rice and almond (no chocolate)  Cream of wheat, cream of rice, grits  Strained creamed soups  (no tomato or broccoli)  Vanilla and strawberry-flavored ice cream  Sherbet  Blended, custard styled or whipped yogurt (plain or vanilla only)  Vanilla and butterscotch pudding (no chocolate or coconut)  Nutritional drinks including Ensure, Boost, Carnation Instant Breakfast (no chocolate-flavored) Note: Dairy products, such as milk, ice cream and pudding, may cause diarrhea in some people just after surgery. You may need to avoid milk products. If so, substitute them with lactose-free beverages, such as soy, rice, Lactaid or almond milks.  Nissen Soft Diet Food Category Foods to Choose Foods to Avoid  Beverages Milk, such as, whole, 2%, 1%, non-fat, or skim, soy, rice, almond  Caffeinated and decaf tea and coffee  Powdered drink mixes (in moderation)  Non-citrus juices (apple, grape, cranberry or blends of these)  Fruit nectars  Nutritional drinks including Boost, Ensure, Carnation Instant Breakfast Chocolate milk, cocoa or other chocolate-flavored drinks  Carbonated drinks  Alcohol  Citrus juices like orange, grapefruit, lemon and lime  Breads Pancakes, [place] toast and waffles  Crackers (saltine, butter, soda, graham, Goldfish and Cheese Nips)  Toasted bread Untoasted bread, bagels, [place] and hard rolls, English muffins  Crackers with nuts, seeds, fresh or dried fruit, coconut, or highly seasoned, such as garlic or onion-flavored  Sweet rolls, coffee cake or doughnuts  Cereals Well cooked cereals, such as oatmeal (plain or flavored)  Cold cereal (Cornflakes, Rice Krispies, Cheerios, Special K plain, Rice Chex and puffed rice) Very coarse cereal, such as bran, shredded wheat  Any cereal with fresh or dried fruit, coconut, seeds or nuts  Desserts Eat in moderation and do not eat desserts or sweets by themselves. Plain cakes, cookies and cream-filled pies  Vanilla and butterscotch pudding or custard  Ice cream, ice milk, frozen yogurt and sherbet  Gelatin made from  allowed foods  Fruit ices and popsicles Desserts containing chocolate, coconut, nuts, seeds, fresh or dried fruit, peppermint or spearmint  Eggs  Poached, hard boiled or scrambled Fried eggs and highly seasoned eggs (deviled eggs)  Fats Eat in moderation. Butter and margarine  Mayonnaise and vegetable oils  Mildly seasoned cream sauces and gravies  Plain cream cheese  Sour cream Highly seasoned salad dressings, cream sauces and gravies  Bacon, bacon fat, ham fat, lard and salt pork  Fried foods  Nuts  Fruits Fruit juice  Any canned or cooked fruit except those listed in the AVOID column ALL fresh fruits, such as citrus, bananas and pineapple  Canned pineapple  Dried fruits, such as raisins, berries  Fruits with seeds, such as berries, kiwi and figs  Meat, Fish, Poultry, and [employer] may be ground, minced or chopped to ease swallowing and digestion  Tender, well cooked and moist cuts of beef, chicken, [place] and pork  Veal and lamb  Flaky, cooked fish  Canned tuna  Cottage and ricotta cheeses  Mild cheese, such as American, brick, mozzarella and baby Swiss  Creamy peanut butter  Plain custard or blended fruit yogurt  Moist casseroles, such as macaroni & cheese, tuna noodle  Grilled or toasted cheese sandwich Tough meats with a lot of gristle  Fried, highly seasoned, smoked and fatty meat, fish or poultry, such as frankfurters, luncheon meats, sausage, bacon, spare ribs, beef brisket, sardines, anchovies, duck and goose  Chili and other entrees made with pepper or chili pepper  Shellfish  Strongly flavored cheeses, such as sharp cheese, extra sharp cheddar, cheese containing peppers or other seasonings  Crunchy peanut butter  Any yogurt with nuts, seeds, coconut, strawberries or raspberries  Potatoes and Starches Peeled, mashed or boiled white or sweet potatoes  Oven-baked potatoes without skin  Well cooked white rice, enriched noodles, barley, spaghetti, macaroni and  other pastas Fried potatoes, potato skins and potato chips  Hard and soft taco shells  Fried, brown or wild rice  Soups Mildly flavored meat stocks  Cream soups made from allowed foods Highly seasoned soups and tomato based soups, cream soups made with gas producing vegetables, such as broccoli, cauliflower, onion, etc.  Sweets and Snacks Use in moderation and do not eat large amounts of sweets by themselves. Syrup, honey, jelly and seedless jam  Plain hard candies and plain candies made with allowed ingredients  Molasses  Marshmallows  Other candy made from allowed ingredients  Thin pretzels Jam, marmalade and preserves  Chocolate in any form  Any candy containing nuts, coconut, seeds, peppermint, spearmint or dried or fresh fruit  Popcorn, potato chips, tortilla chips  Soft or hard thick pretzels, such as sourdough  Vegetables Well cooked soft vegetables without seeds or skins, such as asparagus tips, beets, carrots, green and wax beans, chopped spinach, tender canned baby peas, squash and pumpkin Raw vegetables, tomatoes, tomato juice, tomato sauce and V-8 juice  Gas producing vegetables, such as broccoli, Brussel sprouts, cabbage, cauliflower, onions, corn, cucumber, green peppers, rutabagas, turnips, radishes and sauerkraut  Dried beans, peas and lentils  Miscellaneous Salt and spices in moderation  Mustard and vinegar in moderation Fried or highly seasoned foods  Coconut and seeds  Pickles and olives  Chili sauces, ketchup, barbecue sauce, horseradish, black pepper, chili powder and onion and garlic seasonings  Any other strongly flavored seasoning, condiment, spice or herb not tolerated  Any food not tolerated

## 2018-06-19 NOTE — Anesthesia Preprocedure Evaluation (Signed)
Anesthesia Evaluation  Patient identified by MRN, date of birth, ID band Patient awake    Reviewed: Allergy & Precautions, H&P , NPO status , Patient's Chart, lab work & pertinent test results  History of Anesthesia Complications Negative for: history of anesthetic complications  Airway Mallampati: II  TM Distance: >3 FB Neck ROM: full    Dental no notable dental hx.    Pulmonary Current Smoker,    Pulmonary exam normal breath sounds clear to auscultation       Cardiovascular negative cardio ROS Normal cardiovascular exam Rhythm:regular Rate:Normal     Neuro/Psych negative neurological ROS     GI/Hepatic Neg liver ROS, Medicated,  Endo/Other  negative endocrine ROS  Renal/GU negative Renal ROS  negative genitourinary   Musculoskeletal   Abdominal   Peds  Hematology negative hematology ROS (+)   Anesthesia Other Findings   Reproductive/Obstetrics                             Anesthesia Physical Anesthesia Plan  ASA: II  Anesthesia Plan: General LMA   Post-op Pain Management:    Induction:   PONV Risk Score and Plan:   Airway Management Planned:   Additional Equipment:   Intra-op Plan:   Post-operative Plan:   Informed Consent: I have reviewed the patients History and Physical, chart, labs and discussed the procedure including the risks, benefits and alternatives for the proposed anesthesia with the patient or authorized representative who has indicated his/her understanding and acceptance.       Plan Discussed with:   Anesthesia Plan Comments:         Anesthesia Quick Evaluation

## 2018-06-20 ENCOUNTER — Encounter: Payer: Self-pay | Admitting: Surgery

## 2018-06-26 ENCOUNTER — Other Ambulatory Visit: Payer: Self-pay

## 2018-06-26 ENCOUNTER — Encounter
Admit: 2018-06-26 | Discharge: 2018-06-26 | Disposition: A | Discharge status: Home / Self Care | Payer: Medicaid Other | Attending: Surgery | Admitting: Surgery

## 2018-06-26 NOTE — Patient Instructions (Addendum)
Your procedure is scheduled on: 07-04-18  Report to Same Day Surgery 2nd floor medical mall [facility] Entrance-take elevator on left to 2nd floor.  Check in with surgery information desk.) To find out your arrival time please call [phone] between 1PM - 3PM on 07-03-18  Remember: Instructions that are not followed completely may result in serious medical risk, up to and including death, or upon the discretion of your surgeon and anesthesiologist your surgery may need to be rescheduled.    _x___ 1. Do not eat food after midnight the night before your procedure. You may drink clear liquids up to 2 hours before you are scheduled to arrive at the hospital for your procedure.  Do not drink clear liquids within 2 hours of your scheduled arrival to the hospital.  Clear liquids include  --Water or Apple juice without pulp  --Clear carbohydrate beverage such as ClearFast or Gatorade  --Black Coffee or Clear Tea (No milk, no creamers, do not add anything to the coffee or Tea   ____Ensure clear carbohydrate drink on the way to the hospital for bariatric patients  ____Ensure clear carbohydrate drink 3 hours before surgery for Dr Dwyane Luo patients if physician instructed.   No gum chewing or hard candies.     __x__ 2. No Alcohol for 24 hours before or after surgery.   __x__3. No Smoking or e-cigarettes for 24 prior to surgery.  Do not use any chewable tobacco products for at least 6 hour prior to surgery   ____  4. Bring all medications with you on the day of surgery if instructed.    __x__ 5. Notify your doctor if there is any change in your medical condition     (cold, fever, infections).    x___6. On the morning of surgery brush your teeth with toothpaste and water.  You may rinse your mouth with mouth wash if you wish.  Do not swallow any toothpaste or mouthwash.   Do not wear jewelry, make-up, hairpins, clips or nail polish.  Do not wear lotions, powders, or perfumes. You may wear  deodorant.  Do not shave 48 hours prior to surgery. Men may shave face and neck.  Do not bring valuables to the hospital.    [facility] is not responsible for any belongings or valuables.               Contacts, dentures or bridgework may not be worn into surgery.  Leave your suitcase in the car. After surgery it may be brought to your room.  For patients admitted to the hospital, discharge time is determined by your treatment team.  _  Patients discharged the day of surgery will not be allowed to drive home.  You will need someone to drive you home and stay with you the night of your procedure.    Please read over the following fact sheets that you were given:   [facility] Preparing for Surgery  _x___ TAKE THE FOLLOWING MEDICATION THE MORNING OF SURGERY WITH A SMALL SIP OF WATER. These include:  1. BUSPAR  2. GABAPENTIN  3. LAMICTAL  4. LATUDA  5. PROTONIX  6. PROPRANOLOL  7. TOPAMAX   ____Fleets enema or Magnesium Citrate as directed.   ____ Use CHG Soap or sage wipes as directed on instruction sheet   __X__ Use inhalers on the day of surgery and bring to hospital day of surgery-USE ALBUTEROL INHALER DAY OF SURGERY AND [place]  ____ Stop Metformin and Janumet  2 days prior to surgery.    ____ Take 1/2 of usual insulin dose the night before surgery and none on the morning     surgery.   ____ Follow recommendations from Cardiologist, Pulmonologist or PCP regarding          stopping Aspirin, Coumadin, Plavix ,Eliquis, Effient, or Pradaxa, and Pletal.  X____Stop Anti-inflammatories such as Advil, Aleve, Ibuprofen, Motrin, Naproxen, Naprosyn, Goodies powders or aspirin products. OK to take Tylenol OR OXYCODONE IF NEEDED   _x___ Stop supplements until after surgery-STOP MELATONIN NOW   ____ Bring C-Pap to the hospital.

## 2018-07-02 ENCOUNTER — Other Ambulatory Visit: Payer: Self-pay

## 2018-07-02 ENCOUNTER — Encounter: Payer: Self-pay | Admitting: Occupational Therapy

## 2018-07-02 ENCOUNTER — Ambulatory Visit: Payer: Medicaid Other | Attending: Surgery | Admitting: Occupational Therapy

## 2018-07-02 DIAGNOSIS — M79642 Pain in left hand: Secondary | ICD-10-CM | POA: Insufficient documentation

## 2018-07-02 DIAGNOSIS — M25642 Stiffness of left hand, not elsewhere classified: Secondary | ICD-10-CM | POA: Insufficient documentation

## 2018-07-02 DIAGNOSIS — M542 Cervicalgia: Secondary | ICD-10-CM | POA: Diagnosis present

## 2018-07-02 DIAGNOSIS — R600 Localized edema: Secondary | ICD-10-CM | POA: Insufficient documentation

## 2018-07-02 DIAGNOSIS — M6281 Muscle weakness (generalized): Secondary | ICD-10-CM | POA: Diagnosis present

## 2018-07-02 NOTE — Therapy (Signed)
[place] [facility] [address], [place], [address] Phone: [phone]   Fax:  [phone]  Occupational Therapy Evaluation  Patient Details  Name: Victoria Pierce MRN: [id number] Date of Birth: 1980-08-02 Referring Provider (OT): Poggi   Encounter Date: 07/02/2018  OT End of Session - 07/02/18 1710    Visit Number  1    Number of Visits  12    Date for OT Re-Evaluation  08/13/18    OT Start Time  0950    OT Stop Time  1051    OT Time Calculation (min)  61 min    Activity Tolerance  Patient tolerated treatment well    Behavior During Therapy  [facility] for tasks assessed/performed       Past Medical History:  Diagnosis Date  . Anxiety   . Asthma   . Bipolar 1 disorder ([place])   . Depression   . Diverticulitis   . GERD (gastroesophageal reflux disease)   . Hernia, abdominal   . Ulcerative colitis   . Vaginal septum     Past Surgical History:  Procedure Laterality Date  . CLOSED REDUCTION METACARPAL WITH PERCUTANEOUS PINNING Left 03/07/2018   Procedure: CLOSED REDUCTION METACARPAL WITH PERCUTANEOUS PINNING-5th metacarpal;  Surgeon: Corky Mull, MD;  Location: [facility] ORS;  Service: Orthopedics;  Laterality: Left;  . COLONOSCOPY WITH PROPOFOL N/A 05/14/2018   Procedure: COLONOSCOPY WITH PROPOFOL;  Surgeon: Lin Landsman, MD;  Location: [facility] ENDOSCOPY;  Service: Gastroenterology;  Laterality: N/A;  . ESOPHAGOGASTRODUODENOSCOPY (EGD) WITH PROPOFOL N/A 05/14/2018   Procedure: ESOPHAGOGASTRODUODENOSCOPY (EGD) WITH PROPOFOL;  Surgeon: Lin Landsman, MD;  Location: [facility] ENDOSCOPY;  Service: Gastroenterology;  Laterality: N/A;  . HARDWARE REMOVAL Left 04/03/2018   Procedure: LEFT HAND FIFTH METACARPAL PIN REMOVAL;  Surgeon: Corky Mull, MD;  Location: [place];  Service: Orthopedics;  Laterality: Left;  . TRIGGER FINGER RELEASE Left 06/19/2018   Procedure: EXTENSOR TENOLYSIS WITH CAPSULAR RELEASE  OF LEFT LITTLE MCP JOINT;  Surgeon: Corky Mull, MD;  Location: [place];  Service: Orthopedics;  Laterality: Left;  . TUBAL LIGATION      There were no vitals filed for this visit.  Subjective Assessment - 07/02/18 1703    Subjective   I seen the Dr yesterday and my stitches come out - he was happy that I was schedule for today - but I am having hernia surgery later this week and would not be able to see you for 2 wks - I ran out of pain medication - so it hurts     Patient Stated Goals  To get my motion and strength back in my L hand - and bend my pinkie and ring finger     Currently in Pain?  Yes    Pain Score  6     Pain Location  Hand    Pain Orientation  Left    Pain Descriptors / Indicators  Aching;Tightness;Sore;Tender    Pain Type  Surgical pain    Pain Onset  1 to 4 weeks ago    Pain Frequency  Constant    Aggravating Factors   increase with ROM         [facility] OT Assessment - 07/02/18 0001      Assessment   Medical Diagnosis  L 5th extensor tenolysis and capsular release     Referring Provider (OT)  Poggi    Onset Date/Surgical Date  06/19/18    Hand  Dominance  Right    Prior Therapy  --   after ORIF of 5th MC in past      Home  Environment   Lives With  Daughter      Prior Function   Leisure  house work, pets, phone and tablet       Left Hand AROM   L Ring  MCP 0-90  55 Degrees    L Ring PIP 0-100  80 Degrees   -5   L Little  MCP 0-90  30 Degrees    L Little PIP 0-100  70 Degrees   -30        Contrast  Silicon digisleeve for 5thand 4th wear as needed  cica scar pad for dorsal scar - when sterri strips come off   5 x day  PROM to DIP ,PIP and DIP/PIP stretch - 1 0reps Composite flexion stretch and exention 10 reps  each digit Tendon glides - MC flexion AAROM  And block intrinsic fist  Composite flexion to foam block  Ice as needed   Buddy strap with 4th and 3rd - 2 x 2 hrs day and 5th and 4th night time per MD and during other time of  day                OT Education - 07/02/18 1710    Education Details  findings of eval and HEP     Person(s) Educated  Patient    Methods  Demonstration;Verbal cues;Handout    Comprehension  Returned demonstration       OT Short Term Goals - 07/02/18 1715      OT SHORT TERM GOAL #1   Title  Pt to be ind in HEP to decrease edema, pain and increase AROM in 4th ad 5th digit     Baseline  Extention of PIP's 4th -25, 5th -30 ;  flexion 5th MC 30 , PIP 70 ; 4th MC 55 and PIP 80     Time  3    Period  Weeks    Status  New    Target Date  07/23/18      OT SHORT TERM GOAL #2   Title  AROM in L 4thand 5th digit improve for pt to grip 2 cm cylinder - brush, knife     Baseline  See AROM at 1st goal - baseline     Time  3    Period  Weeks    Status  New    Target Date  07/23/18        OT Long Term Goals - 07/02/18 1717      OT LONG TERM GOAL #1   Title  Pain on PRWHE improve with more than 12 points     Baseline  pain at rest 5-6/10 and increase with AROM and AAROM 10/10 - PRWHE to be done next visit     Time  6    Period  Weeks    Status  New    Target Date  08/13/18      OT LONG TERM GOAL #2   Title  AROM in 4thand 5th improve for pt to touch palm to hold small object in palm     Baseline  see baseline at goal 1 STG    Time  6    Period  Weeks    Status  New    Target Date  08/13/18      OT LONG TERM GOAL #3  Title  Grip strength in L improve to more than 50% to carry more than 5 lbs, cut with knife ,and squeeze washcloth     Baseline  to be assess at 6 wks s/p     Time  4    Period  Weeks    Status  New    Target Date  07/30/18            Plan - 07/02/18 1711    Clinical Impression Statement  Pt present 13 days s/p L 5th extensor tenolysis with capsular release - with increase pain and edema over ulnar side of hand and 4thand 5th digits, decrease AROM and PROM for 4th nad 5th digits at all joints flexion and extention of PIP 's - limiting her  functional use of L hand in ADL's and IADL's - pt is scheduled for hernia repair later this week will contact me when can return     OT Occupational Profile and History  Problem Focused Assessment - Including review of records relating to presenting problem    Occupational performance deficits (Please refer to evaluation for details):  ADL's;IADL's;Leisure;Play    Body Structure / Function / Physical Skills  ADL;Dexterity;Flexibility;ROM;Strength;Scar mobility;FMC;Edema;Coordination;Pain;UE functional use    Rehab Potential  Good   13 days s/p and having hernia repair    Clinical Decision Making  Several treatment options, min-mod task modification necessary    Comorbidities Affecting Occupational Performance:  May have comorbidities impacting occupational performance   ORIF in past of 4thand 5th L MC 's    OT Frequency  2x / week    OT Duration  6 weeks    OT Treatment/Interventions  Self-care/ADL training;Cryotherapy;Paraffin;Therapeutic exercise;Splinting;Scar mobilization;Manual Therapy;Fluidtherapy;Contrast Bath;Passive range of motion;Patient/family education    Plan  assess progress with doing HEP 5 x day     OT Home Exercise Plan  see pt instruction     Consulted and Agree with Plan of Care  Patient       Patient will benefit from skilled therapeutic intervention in order to improve the following deficits and impairments:  Body Structure / Function / Physical Skills  Visit Diagnosis: Stiffness of left hand, not elsewhere classified - Plan: Ot plan of care cert/re-cert  Pain in left hand - Plan: Ot plan of care cert/re-cert  Localized edema - Plan: Ot plan of care cert/re-cert  Muscle weakness (generalized) - Plan: Ot plan of care cert/re-cert    Problem List Patient Active Problem List   Diagnosis Date Noted  . Chronic pelvic pain in female 06/14/2018  . Dysmenorrhea 06/14/2018  . Menorrhagia with regular cycle 06/14/2018  . Stiffness of finger joint, 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  .  Closed displaced fracture of neck of fifth metacarpal bone of left hand 03/07/2018  . Ulcerative colitis   . GERD (gastroesophageal reflux disease)   . Anxiety     Rosalyn Gess OTR/L;CLT 07/02/2018, 5:20 PM  [place] [facility] [address] [place], [address] Phone: [phone]   Fax:  [phone]  Name: Victoria Pierce MRN: [id number] Date of Birth: [birth year]

## 2018-07-02 NOTE — Patient Instructions (Signed)
Contrast  Silicon digisleeve for 5thand 4th wear as needed  cica scar pad for dorsal scar - when sterri strips come off   5 x day  PROM to DIP ,PIP and DIP/PIP stretch - 1 0reps Composite flexion stretch and exention 10 reps  each digit Tendon glides - MC flexion AAROM  And block intrinsic fist  Composite flexion to foam block  Ice as needed   Buddy strap with 4th and 3rd - 2 x 2 hrs day and 5th and 4th night time per MD and during other time of day

## 2018-07-03 ENCOUNTER — Telehealth: Payer: Self-pay | Admitting: *Deleted

## 2018-07-03 NOTE — Telephone Encounter (Signed)
Patient contacted today and notified that due to COVID-19 patient will need to be rescheduled for at least 4 weeks out since this is an elective surgery per Dr. Dahlia Byes. Patient informed that the reschedule date will be a tentative date based upon daily recommendations that we are receiving daily.   Surgery to be moved from 07-04-18 to 08-01-18 at [facility] with Dr. Dahlia Byes.

## 2018-07-04 ENCOUNTER — Inpatient Hospital Stay: Admission: RE | Admit: 2018-07-04 | Payer: Medicaid Other | Source: Home / Self Care | Admitting: Surgery

## 2018-07-04 ENCOUNTER — Encounter: Admission: RE | Payer: Self-pay | Source: Home / Self Care

## 2018-07-04 ENCOUNTER — Telehealth: Payer: Self-pay | Admitting: *Deleted

## 2018-07-04 SURGERY — ROBOTIC ASSISTED LAPAROSCOPIC NISSEN FUNDOPLICATION
Anesthesia: General

## 2018-07-04 NOTE — Telephone Encounter (Signed)
Patient contacted today and notified that we will cancel surgery that is scheduled for 08-01-18 until COVID-19 restrictions are lifted.   The patient verbalizes understanding.

## 2018-07-05 ENCOUNTER — Ambulatory Visit: Payer: Medicaid Other | Admitting: Gastroenterology

## 2018-07-09 ENCOUNTER — Ambulatory Visit: Payer: Medicaid Other | Admitting: Gastroenterology

## 2018-07-09 ENCOUNTER — Encounter: Payer: Medicaid Other | Admitting: Occupational Therapy

## 2018-07-10 ENCOUNTER — Other Ambulatory Visit: Payer: Self-pay

## 2018-07-10 ENCOUNTER — Ambulatory Visit: Payer: Medicaid Other | Admitting: Occupational Therapy

## 2018-07-10 DIAGNOSIS — M79642 Pain in left hand: Secondary | ICD-10-CM

## 2018-07-10 DIAGNOSIS — M25642 Stiffness of left hand, not elsewhere classified: Secondary | ICD-10-CM | POA: Diagnosis not present

## 2018-07-10 DIAGNOSIS — M6281 Muscle weakness (generalized): Secondary | ICD-10-CM

## 2018-07-10 DIAGNOSIS — R600 Localized edema: Secondary | ICD-10-CM

## 2018-07-10 NOTE — Therapy (Signed)
[place] [facility] [address] [place], [address] Phone: [phone]   Fax:  [phone]  Occupational Therapy Treatment  Patient Details  Name: Victoria Pierce MRN: [id number] Date of Birth: 22-Dec-1980 Referring Provider (OT): Poggi   Encounter Date: 07/10/2018  OT End of Session - 07/10/18 1459    Visit Number  2    Number of Visits  12    Date for OT Re-Evaluation  08/13/18    Authorization - Visit Number  1    Authorization - Number of Visits  3    OT Start Time  0905    OT Stop Time  1000    OT Time Calculation (min)  55 min    Activity Tolerance  Patient tolerated treatment well    Behavior During Therapy  [facility] for tasks assessed/performed       Past Medical History:  Diagnosis Date  . Anxiety   . Asthma   . Bipolar 1 disorder ([place])   . Depression   . Diverticulitis   . GERD (gastroesophageal reflux disease)   . Hernia, abdominal   . Ulcerative colitis   . Vaginal septum     Past Surgical History:  Procedure Laterality Date  . CLOSED REDUCTION METACARPAL WITH PERCUTANEOUS PINNING Left 03/07/2018   Procedure: CLOSED REDUCTION METACARPAL WITH PERCUTANEOUS PINNING-5th metacarpal;  Surgeon: Corky Mull, MD;  Location: [facility] ORS;  Service: Orthopedics;  Laterality: Left;  . COLONOSCOPY WITH PROPOFOL N/A 05/14/2018   Procedure: COLONOSCOPY WITH PROPOFOL;  Surgeon: Lin Landsman, MD;  Location: [facility] ENDOSCOPY;  Service: Gastroenterology;  Laterality: N/A;  . ESOPHAGOGASTRODUODENOSCOPY (EGD) WITH PROPOFOL N/A 05/14/2018   Procedure: ESOPHAGOGASTRODUODENOSCOPY (EGD) WITH PROPOFOL;  Surgeon: Lin Landsman, MD;  Location: [facility] ENDOSCOPY;  Service: Gastroenterology;  Laterality: N/A;  . HARDWARE REMOVAL Left 04/03/2018   Procedure: LEFT HAND FIFTH METACARPAL PIN REMOVAL;  Surgeon: Corky Mull, MD;  Location: [place];  Service: Orthopedics;  Laterality: Left;  . TRIGGER FINGER  RELEASE Left 06/19/2018   Procedure: EXTENSOR TENOLYSIS WITH CAPSULAR RELEASE OF LEFT LITTLE MCP JOINT;  Surgeon: Corky Mull, MD;  Location: [place];  Service: Orthopedics;  Laterality: Left;  . TUBAL LIGATION      There were no vitals filed for this visit.  Subjective Assessment - 07/10/18 1453    Subjective   I seen the Dr yesterday because it hurt so much and I cannot bend it as I should - I ended up not having hernia surgery - I brought my partner to see what you do - he helps me with my exercises     Patient Stated Goals  To get my motion and strength back in my L hand - and bend my pinkie and ring finger     Currently in Pain?  Yes    Pain Score  6     Pain Location  Hand    Pain Orientation  Left    Pain Descriptors / Indicators  Aching;Tightness;Sore;Tender    Pain Onset  1 to 4 weeks ago    Pain Frequency  Constant         [facility] OT Assessment - 07/10/18 0001      Left Hand AROM   L Little  MCP 0-90  50 Degrees   75 PROM  and AROM 65 this date        assess AROM in L hand - 5th MC - pt making progress -  did take pain meds  Pain between 3-6/10  Scar adhere distally - pt and partner ed on scar mobs and provided cica scar pad for night time          OT Treatments/Exercises (OP) - 07/10/18 0001      Moist Heat Therapy   Number Minutes Moist Heat  10 Minutes    Moist Heat Location  Hand   Coban flexion wrap done to 5th MC - to increase stretch whil     PROM and prolonged flexion stretch for MC - increase motion  Scar massage done  Assess appropriate dynamic flexion splint or stretch for pt to use   Done knuckle bender with 3 band on radial side and 2 on ulnar side - pt ed on need to be slow stretch but prolonged - pt to use several times during day  Pt unable to tolerate prolonged stretch for more than 10 min - pain increase from 3-6/10 and with attempt to do extention    Pt to 5 x day coban wrap her 5th in composite  flexion while doing heat   5-10 min  PROM for MC flexion , composite flexion  And AROM for tendon glides Knuckle bender 2 band on ulnar side 5-15 min - gradually increase stretch  And wearing time  5 x day And then fabricated  dorsal hand base splint for night time putting hand in 65 degrees of flexion  Will adjust every time as progress - and reassess dynamic traction on flexion          OT Education - 07/10/18 1458    Education Details  HEP adjustments and splints/dynamic splinting use ed on     Person(s) Educated  Patient    Methods  Demonstration;Verbal cues;Handout    Comprehension  Returned demonstration       OT Short Term Goals - 07/02/18 1715      OT SHORT TERM GOAL #1   Title  Pt to be ind in HEP to decrease edema, pain and increase AROM in 4th ad 5th digit     Baseline  Extention of PIP's 4th -25, 5th -30 ;  flexion 5th MC 30 , PIP 70 ; 4th MC 55 and PIP 80     Time  3    Period  Weeks    Status  New    Target Date  07/23/18      OT SHORT TERM GOAL #2   Title  AROM in L 4thand 5th digit improve for pt to grip 2 cm cylinder - brush, knife     Baseline  See AROM at 1st goal - baseline     Time  3    Period  Weeks    Status  New    Target Date  07/23/18        OT Long Term Goals - 07/02/18 1717      OT LONG TERM GOAL #1   Title  Pain on PRWHE improve with more than 12 points     Baseline  pain at rest 5-6/10 and increase with AROM and AAROM 10/10 - PRWHE to be done next visit     Time  6    Period  Weeks    Status  New    Target Date  08/13/18      OT LONG TERM GOAL #2   Title  AROM in 4thand 5th improve for pt to touch palm to hold small object in palm  Baseline  see baseline at goal 1 STG    Time  6    Period  Weeks    Status  New    Target Date  08/13/18      OT LONG TERM GOAL #3   Title  Grip strength in L improve to more than 50% to carry more than 5 lbs, cut with knife ,and squeeze washcloth     Baseline  to be assess at 6 wks s/p     Time  4    Period   Weeks    Status  New    Target Date  07/30/18            Plan - 07/10/18 1459    Clinical Impression Statement  Pt present about 3 wks s/p L 5th extensor tenolysis with capsular release - pt seen surgeon yesterday because of pain and slow progress - pt was seen week ago and flexion of MC ws 30 and this date come in with AROM 50 and in session got 75 PROM flexion and AROM 65 - pt HEP modified and provided her some dynamic splint to use , flexion wrap in heat and static splint for positioning at night time - will reassess in 2 days -     OT Occupational Profile and History  Problem Focused Assessment - Including review of records relating to presenting problem    Occupational performance deficits (Please refer to evaluation for details):  ADL's;IADL's;Leisure;Play    Body Structure / Function / Physical Skills  ADL;Dexterity;Flexibility;ROM;Strength;Scar mobility;FMC;Edema;Coordination;Pain;UE functional use    Rehab Potential  Good    Clinical Decision Making  Several treatment options, min-mod task modification necessary    Comorbidities Affecting Occupational Performance:  May have comorbidities impacting occupational performance    OT Frequency  2x / week    OT Duration  6 weeks    OT Treatment/Interventions  Self-care/ADL training;Cryotherapy;Paraffin;Therapeutic exercise;Splinting;Scar mobilization;Manual Therapy;Fluidtherapy;Contrast Bath;Passive range of motion;Patient/family education    Plan  assess progress with doing HEP 5 x day . splinting , flexion wrap     OT Home Exercise Plan  see pt instruction     Consulted and Agree with Plan of Care  Patient       Patient will benefit from skilled therapeutic intervention in order to improve the following deficits and impairments:  Body Structure / Function / Physical Skills  Visit Diagnosis: Stiffness of left hand, not elsewhere classified  Pain in left hand  Localized edema  Muscle weakness (generalized)    Problem  List Patient Active Problem List   Diagnosis Date Noted  . Chronic pelvic pain in female 06/14/2018  . Dysmenorrhea 06/14/2018  . Menorrhagia with regular cycle 06/14/2018  . Stiffness of finger joint, 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  . Closed displaced fracture of neck of fifth metacarpal bone of left hand 03/07/2018  . Ulcerative colitis   . GERD (gastroesophageal reflux disease)   . Anxiety     Rosalyn Gess OTR/L,CLT 07/10/2018, 3:05 PM  [place] [facility] [address], [place], [address] Phone: [phone]   Fax:  [phone]  Name: ALANYA VUKELICH MRN: [id number] Date of Birth: 05-Apr-1981

## 2018-07-10 NOTE — Patient Instructions (Signed)
Pt to 5 x day coban wrap her 5th in flexion while doing heat  5-10 min  PROM for MC flexion , composite flexion  And AROM for tendon glides Knuckle bender 2 band on ulnar side 5-15 min - gradually increase stretch  And wearing time  5 x day And then dorsal hand base splint for night time putting hand in 65 degrees of flexion

## 2018-07-12 ENCOUNTER — Ambulatory Visit: Payer: Medicaid Other | Admitting: Occupational Therapy

## 2018-07-12 ENCOUNTER — Other Ambulatory Visit: Payer: Self-pay

## 2018-07-12 DIAGNOSIS — M79642 Pain in left hand: Secondary | ICD-10-CM

## 2018-07-12 DIAGNOSIS — M25642 Stiffness of left hand, not elsewhere classified: Secondary | ICD-10-CM | POA: Diagnosis not present

## 2018-07-12 DIAGNOSIS — M6281 Muscle weakness (generalized): Secondary | ICD-10-CM

## 2018-07-12 DIAGNOSIS — R600 Localized edema: Secondary | ICD-10-CM

## 2018-07-12 DIAGNOSIS — M542 Cervicalgia: Secondary | ICD-10-CM

## 2018-07-12 NOTE — Therapy (Signed)
[facility] [address] [place], [address] Phone: [phone]   Fax:  [phone]  Occupational Therapy Treatment  Patient Details  Name: Victoria Pierce MRN: [id number] Date of Birth: [birth year] Referring Provider (OT): Poggi   Encounter Date: 07/12/2018  OT End of Session - 07/12/18 0922    Visit Number  3    Number of Visits  12    Date for OT Re-Evaluation  08/13/18    Authorization - Visit Number  2    Authorization - Number of Visits  3    OT Start Time  0900    OT Stop Time  0946    OT Time Calculation (min)  46 min    Activity Tolerance  Patient tolerated treatment well    Behavior During Therapy  [facility] for tasks assessed/performed       Past Medical History:  Diagnosis Date  . Anxiety   . Asthma   . Bipolar 1 disorder ([place])   . Depression   . Diverticulitis   . GERD (gastroesophageal reflux disease)   . Hernia, abdominal   . Ulcerative colitis   . Vaginal septum     Past Surgical History:  Procedure Laterality Date  . CLOSED REDUCTION METACARPAL WITH PERCUTANEOUS PINNING Left 03/07/2018   Procedure: CLOSED REDUCTION METACARPAL WITH PERCUTANEOUS PINNING-5th metacarpal;  Surgeon: Corky Mull, MD;  Location: [facility] ORS;  Service: Orthopedics;  Laterality: Left;  . COLONOSCOPY WITH PROPOFOL N/A 05/14/2018   Procedure: COLONOSCOPY WITH PROPOFOL;  Surgeon: Lin Landsman, MD;  Location: [facility] END[facility]COPY;  Service: Gastroenterology;  Laterality: N/A;  . ESOPHAGOGASTRODUODENOSCOPY (EGD) WITH PROPOFOL N/A 05/14/2018   Procedure: ESOPHAGOGASTRODUODENOSCOPY (EGD) WITH PROPOFOL;  Surgeon: Lin Landsman, MD;  Location: [facility] ENDOSCOPY;  Service: Gastroenterology;  Laterality: N/A;  . HARDWARE REMOVAL Left 04/03/2018   Procedure: LEFT HAND FIFTH METACARPAL PIN REMOVAL;  Surgeon: Corky Mull, MD;  Location: [place];  Service: Orthopedics;  Laterality: Left;  . TRIGGER FINGER  RELEASE Left 06/19/2018   Procedure: EXTENSOR TENOLYSIS WITH CAPSULAR RELEASE OF LEFT LITTLE MCP JOINT;  Surgeon: Corky Mull, MD;  Location: [place];  Service: Orthopedics;  Laterality: Left;  . TUBAL LIGATION      There were no vitals filed for this visit.  Subjective Assessment - 07/12/18 0858    Subjective   We tried do my exercises 5 x - I need my hand - Dr said this is the last change- did use the wrap and kunckle bender - tried 3 band but could only do 5 min     Patient Stated Goals  To get my motion and strength back in my L hand - and bend my pinkie and ring finger     Currently in Pain?  Yes    Pain Score  5     Pain Location  Hand    Pain Orientation  Left    Pain Descriptors / Indicators  Aching;Tightness;Sore    Pain Type  Surgical pain    Pain Frequency  Constant         [facility] OT Assessment - 07/12/18 0001      Left Hand AROM   L Ring  MCP 0-90  80 Degrees    L Ring PIP 0-100  80 Degrees    L Little  MCP 0-90  60 Degrees   70 AROM in session  PROM 75    L Little PIP 0-100  65 Degrees   -45 extention       Cont to progress- but PROM same this date - pt to wear knucklebender for longer times - about 20-30 min after session but only one band on ulnar side - OR 15 min 2 band   but not 3 like she tried - to much pressure and pain         OT Treatments/Exercises (OP) - 07/12/18 0001      LUE Contrast Bath   Time  8 minutes    Comments  coban wrap - 5th digit flexion wrap         PROM and prolonged flexion stretch for MC - increase motion  Scar massage held off on - one pin hole looked little fluid - pt to keep eye on it and contact surgeon if signs of infection - reinforce again not to soak hand - but only under running water  Assess dynamic flexion splint or stretch that pt is using    Done knuckle bender with 3 band on radial side and 2 on ulnar side - pt ed on need to be slow stretch but prolonged - pt to use several times during day -  did increase the last 2 days to 3 band could only do 5 in  REINFORCe -would prefer 1 band and 30 min end of session - or 15 min 2 bands  And can work while in it on PIP extention   Cont at home  Pt to 5 x day coban wrap her 5th in composite  flexion while doing heat  5-10 min  PROM for MC flexion , composite flexion  And AROM for tendon glides Knuckle bender 5 x day And then  add 1 cm soft foam to posterior proximal phalanges and over [place] in   dorsal hand base splint for night time putting hand in 65 degrees of flexion  Will adjust next session as progress - and reassess dynamic traction on flexion        OT Education - 07/12/18 0922    Education Details  adjusted knucklebender longer time - one band- and night splint with close cell foam over MC's     Person(s) Educated  Patient    Methods  Demonstration;Verbal cues;Handout    Comprehension  Returned demonstration       OT Short Term Goals - 07/12/18 0951      OT SHORT TERM GOAL #1   Title  Pt to be ind in HEP to decrease edema, pain and increase AROM in 4th ad 5th digit     Baseline  Extention of PIP's 4th -25, 5th -30 ;  flexion 5th MC 30 , PIP 70 ; 4th MC 55 and PIP 80 ; NOW -30 4th , -45 PIP 5th ; MC flexion 4th 80 , 5th 60     Time  2    Period  Weeks    Status  On-going    Target Date  07/23/18      OT SHORT TERM GOAL #2   Title  AROM in L 4thand 5th digit improve for pt to grip 2 cm cylinder - brush, knife     Baseline  in session able to touch or reach to 2 cm object- but not upon arrival -cannot grip     Time  2    Period  Weeks    Status  On-going    Target Date  07/23/18        OT  Long Term Goals - 07/12/18 0954      OT LONG TERM GOAL #1   Title  Pain on PRWHE improve with more than 12 points     Baseline  pain at rest 5-6/10 still but pain only increase now with PROM to 10/10 - PRWHE  for pain 46/50     Time  4    Period  Weeks    Status  On-going    Target Date  08/13/18      OT LONG TERM GOAL #2    Title  AROM in 4thand 5th improve for pt to touch palm to hold small object in palm     Baseline  see baseline at goal 1 STG- and see progress at STG 1    Time  4    Period  Weeks    Status  On-going    Target Date  08/13/18      OT LONG TERM GOAL #3   Title  Grip strength in L improve to more than 50% to carry more than 5 lbs, cut with knife ,and squeeze washcloth     Baseline   NT - pt only 3 1/2 wks s/p    Time  4    Period  Weeks    Status  New    Target Date  08/13/18            Plan - 07/12/18 0881    Clinical Impression Statement  Pt is present 3 1/2 wks s/p 5th extensor tenolysis with capsulare release - seen surgeon earlier this week because of increase pain and slow pogress - pt was scheduled for hernia surgery but was cancelled - seen 2 x this wk -- progressed from 3/17 30 degrees to 60 degrees of MC flexion at 5th - pain 5-6/10 - but PROM about the same at 75 degrees - pt to be seen 2 x wk to increase flexion of MC 4thand 5th and increase PIP flexion and exention - PIP extention lag of -45  - worse than prior to surgery - but focus now on 5th MC flexion     OT Occupational Profile and History  Problem Focused Assessment - Including review of records relating to presenting problem    Occupational performance deficits (Please refer to evaluation for details):  ADL's;IADL's;Leisure;Play    Body Structure / Function / Physical Skills  ADL;Dexterity;Flexibility;ROM;Strength;Scar mobility;FMC;Edema;Coordination;Pain;UE functional use    Rehab Potential  Good    Clinical Decision Making  Several treatment options, min-mod task modification necessary    Comorbidities Affecting Occupational Performance:  May have comorbidities impacting occupational performance    OT Frequency  2x / week    OT Duration  4 weeks    OT Treatment/Interventions  Self-care/ADL training;Cryotherapy;Paraffin;Therapeutic exercise;Splinting;Scar mobilization;Manual Therapy;Fluidtherapy;Contrast  Bath;Passive range of motion;Patient/family education    Plan  assess progress with doing HEP 5 x day . splinting , flexion wrap     OT Home Exercise Plan  see pt instruction     Consulted and Agree with Plan of Care  Patient       Patient will benefit from skilled therapeutic intervention in order to improve the following deficits and impairments:  Body Structure / Function / Physical Skills  Visit Diagnosis: Stiffness of left hand, not elsewhere classified  Pain in left hand  Localized edema  Muscle weakness (generalized)  Cervicalgia    Problem List Patient Active Problem List   Diagnosis Date Noted  . Chronic pelvic pain in female  06/14/2018  . Dysmenorrhea 06/14/2018  . Menorrhagia with regular cycle 06/14/2018  . Stiffness of finger joint, 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  . Closed displaced fracture of neck of fifth metacarpal bone of left hand 03/07/2018  . Ulcerative colitis   . GERD (gastroesophageal reflux disease)   . Anxiety     Rosalyn Gess OTR/L,CLT 07/12/2018, 9:57 AM  [facility] [address], [place], [address] Phone: [phone]   Fax:  [phone]  Name: MOZELLA REXRODE MRN: [id number] Date of Birth: 1981-02-09

## 2018-07-12 NOTE — Patient Instructions (Signed)
Cont with same HEP

## 2018-07-16 ENCOUNTER — Other Ambulatory Visit: Payer: Self-pay

## 2018-07-16 ENCOUNTER — Ambulatory Visit: Payer: Medicaid Other | Admitting: Occupational Therapy

## 2018-07-16 DIAGNOSIS — M6281 Muscle weakness (generalized): Secondary | ICD-10-CM

## 2018-07-16 DIAGNOSIS — M25642 Stiffness of left hand, not elsewhere classified: Secondary | ICD-10-CM | POA: Diagnosis not present

## 2018-07-16 DIAGNOSIS — R600 Localized edema: Secondary | ICD-10-CM

## 2018-07-16 DIAGNOSIS — M79642 Pain in left hand: Secondary | ICD-10-CM

## 2018-07-16 NOTE — Therapy (Signed)
[facility] [address], [place], [address] Phone: [phone]   Fax:  [phone]  Occupational Therapy Treatment  Patient Details  Name: Victoria Pierce MRN: [id number] Date of Birth: [birth year] Referring Provider (OT): Poggi   Encounter Date: 07/16/2018  OT End of Session - 07/16/18 1418    Number of Visits  12    Date for OT Re-Evaluation  08/13/18    Authorization - Visit Number  3    Authorization - Number of Visits  15    OT Start Time  1300    OT Stop Time  6803    OT Time Calculation (min)  59 min    Activity Tolerance  Patient tolerated treatment well    Behavior During Therapy  [facility] for tasks assessed/performed       Past Medical History:  Diagnosis Date  . Anxiety   . Asthma   . Bipolar 1 disorder ([place])   . Depression   . Diverticulitis   . GERD (gastroesophageal reflux disease)   . Hernia, abdominal   . Ulcerative colitis   . Vaginal septum     Past Surgical History:  Procedure Laterality Date  . CLOSED REDUCTION METACARPAL WITH PERCUTANEOUS PINNING Left 03/07/2018   Procedure: CLOSED REDUCTION METACARPAL WITH PERCUTANEOUS PINNING-5th metacarpal;  Surgeon: Corky Mull, MD;  Location: [facility] ORS;  Service: Orthopedics;  Laterality: Left;  . COLONOSCOPY WITH PROPOFOL N/A 05/14/2018   Procedure: COLONOSCOPY WITH PROPOFOL;  Surgeon: Lin Landsman, MD;  Location: [facility] ENDOSCOPY;  Service: Gastroenterology;  Laterality: N/A;  . ESOPHAGOGASTRODUODENOSCOPY (EGD) WITH PROPOFOL N/A 05/14/2018   Procedure: ESOPHAGOGASTRODUODENOSCOPY (EGD) WITH PROPOFOL;  Surgeon: Lin Landsman, MD;  Location: [facility] ENDOSCOPY;  Service: Gastroenterology;  Laterality: N/A;  . HARDWARE REMOVAL Left 04/03/2018   Procedure: LEFT HAND FIFTH METACARPAL PIN REMOVAL;  Surgeon: Corky Mull, MD;  Location: [place];  Service: Orthopedics;  Laterality: Left;  . TRIGGER FINGER RELEASE Left 06/19/2018    Procedure: EXTENSOR TENOLYSIS WITH CAPSULAR RELEASE OF LEFT LITTLE MCP JOINT;  Surgeon: Corky Mull, MD;  Location: [place];  Service: Orthopedics;  Laterality: Left;  . TUBAL LIGATION      There were no vitals filed for this visit.  Subjective Assessment - 07/16/18 1412    Subjective   I am doing my exercises 5 x day -and can do now for longer 2 bands on the pinkie side on knucklebender - night one needs to be adjusted - I brought it - seeing Dr tomorrow for this blister thing on my finger    Patient Stated Goals  To get my motion and strength back in my L hand - and bend my pinkie and ring finger     Currently in Pain?  Yes    Pain Score  --   3-6/10    Pain Location  Hand    Pain Orientation  Left    Pain Descriptors / Indicators  Aching;Tightness;Sore    Pain Type  Surgical pain    Pain Onset  1 to 4 weeks ago    Pain Frequency  Constant    Aggravating Factors   increase with ROM          [facility] OT Assessment - 07/16/18 0001      Left Hand AROM   L Little  MCP 0-90  65 Degrees   in session 70-75 and PROM 80-85     Pt stiff coming  in - pt report squeezing ball - education done that ball do not allow enough MC flexion to help - cont with HEP , dynamic stretch - flexion to 2cm foam block and night splint          OT Treatments/Exercises (OP) - 07/16/18 0001      LUE Contrast Bath   Time  11 minutes    Comments  --   coban flexion wrap to 5th to increase MC flexion     scar massage done this date -using xtractor with some AROM - and scar massage done by OT  kinesiotape done 2 short strips over distal scar in X to provided some scar mobilization while doing HEP   PROM and prolonged flexion stretch for MC - increase motion   Assess dynamic flexion splint or stretch that pt is using    Done knuckle bender with 3 band on radial side and 2 on ulnar side - pt report able to do now 2 band for 30 min - pt to do 3 bands now for 10-15 min and then 20-30 min  2 bands for  slow stretch and  prolonged - pt to use several times during day -  REINFORCe -would prefer 2 band and 30 min end of session - and 10-73mn  2 bands prior And can work while in it on PIP extention   Cont at home Pt to 5 x day coban wrap her 5th incompositeflexion while doing heat  5-10 min  PROM for MC flexion , composite flexion  Knuckle bender 5 x day Then place and hold done this date and add to HEP -for composite fist - was able to maintain place and hold at 80 this date in session   And AROM to 2 cm foamblock in palm     modify night dorsal hand base splint to  Provided increase  MC  Flexion at 70 degrees flexion - moldskin done inside of hand for padding over PIP's and MC's        OT Education - 07/16/18 1417    Education Details  adjusted knucklebender longer time 2 band with 3 band prior for short period- and [facility]    Person(s) Educated  Patient    Methods  Demonstration;Verbal cues;Handout    Comprehension  Returned demonstration       OT Short Term Goals - 07/12/18 0951      OT SHORT TERM GOAL #1   Title  Pt to be ind in HEP to decrease edema, pain and increase AROM in 4th ad 5th digit     Baseline  Extention of PIP's 4th -25, 5th -30 ;  flexion 5th MC 30 , PIP 70 ; 4th MC 55 and PIP 80 ; NOW -30 4th , -45 PIP 5th ; MC flexion 4th 80 , 5th 60     Time  2    Period  Weeks    Status  On-going    Target Date  07/23/18      OT SHORT TERM GOAL #2   Title  AROM in L 4thand 5th digit improve for pt to grip 2 cm cylinder - brush, knife     Baseline  in session able to touch or reach to 2 cm object- but not upon arrival -cannot grip     Time  2    Period  Weeks    Status  On-going    Target Date  07/23/18  OT Long Term Goals - 07/12/18 0954      OT LONG TERM GOAL #1   Title  Pain on PRWHE improve with more than 12 points     Baseline  pain at rest 5-6/10 still but pain only increase now with PROM  to 10/10 - PRWHE  for pain 46/50     Time  4    Period  Weeks    Status  On-going    Target Date  08/13/18      OT LONG TERM GOAL #2   Title  AROM in 4thand 5th improve for pt to touch palm to hold small object in palm     Baseline  see baseline at goal 1 STG- and see progress at STG 1    Time  4    Period  Weeks    Status  On-going    Target Date  08/13/18      OT LONG TERM GOAL #3   Title  Grip strength in L improve to more than 50% to carry more than 5 lbs, cut with knife ,and squeeze washcloth     Baseline   NT - pt only 3 1/2 wks s/p    Time  4    Period  Weeks    Status  New    Target Date  08/13/18            Plan - 07/16/18 1418    Clinical Impression Statement  Pt present about 4 wks s/p5th extensor tenolysis with capsulare release - pt cont to show progress in flexion - able to get about 80 degrees PROM in session -and pt increase to 75 degrees - walking in 65 - pt night splint increase for MC flexion -and knuckle bender during day for dynamic stretch 2-3 bands  5 x day for about 20-40 min - pt to see MD for scab on distal 5th tomorrow     OT Occupational Profile and History  Problem Focused Assessment - Including review of records relating to presenting problem    Occupational performance deficits (Please refer to evaluation for details):  ADL's;IADL's;Leisure;Play    Body Structure / Function / Physical Skills  ADL;Dexterity;Flexibility;ROM;Strength;Scar mobility;FMC;Edema;Coordination;Pain;UE functional use    Rehab Potential  Good    Clinical Decision Making  Several treatment options, min-mod task modification necessary    Comorbidities Affecting Occupational Performance:  May have comorbidities impacting occupational performance    OT Frequency  2x / week    OT Duration  4 weeks    OT Treatment/Interventions  Self-care/ADL training;Cryotherapy;Paraffin;Therapeutic exercise;Splinting;Scar mobilization;Manual Therapy;Fluidtherapy;Contrast Bath;Passive range of  motion;Patient/family education    Plan  assess progress with doing HEP 5 x day . splinting , flexion wrap     OT Home Exercise Plan  see pt instruction     Consulted and Agree with Plan of Care  Patient       Patient will benefit from skilled therapeutic intervention in order to improve the following deficits and impairments:  Body Structure / Function / Physical Skills  Visit Diagnosis: Stiffness of left hand, not elsewhere classified  Pain in left hand  Localized edema  Muscle weakness (generalized)    Problem List Patient Active Problem List   Diagnosis Date Noted  . Chronic pelvic pain in female 06/14/2018  . Dysmenorrhea 06/14/2018  . Menorrhagia with regular cycle 06/14/2018  . Stiffness of finger joint, 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  . Closed displaced fracture of neck of fifth metacarpal bone of left hand 03/07/2018  .  Ulcerative colitis   . GERD (gastroesophageal reflux disease)   . Anxiety     Rosalyn Gess OTR/l,CLT 07/16/2018, 2:22 PM  [facility] [address], [place], [address] Phone: [phone]   Fax:  [phone]  Name: Victoria Pierce MRN: [id number] Date of Birth: 09/22/1980

## 2018-07-16 NOTE — Patient Instructions (Signed)
Increase knuckle bender to 3 bands for 10-15 min on ulnar side and then 2 bands for 20-30 min  Followed by Place and hold 10 reps composite flexion  And AROM to 2 cm foam block

## 2018-07-18 ENCOUNTER — Other Ambulatory Visit: Payer: Self-pay

## 2018-07-18 ENCOUNTER — Ambulatory Visit
Admission: RE | Admit: 2018-07-18 | Discharge: 2018-07-18 | Disposition: A | Discharge status: Home / Self Care | Payer: Medicaid Other | Source: Ambulatory Visit | Attending: Obstetrics & Gynecology | Admitting: Obstetrics & Gynecology

## 2018-07-18 ENCOUNTER — Ambulatory Visit: Payer: Medicaid Other

## 2018-07-18 DIAGNOSIS — R102 Pelvic and perineal pain: Secondary | ICD-10-CM | POA: Diagnosis present

## 2018-07-18 DIAGNOSIS — N920 Excessive and frequent menstruation with regular cycle: Secondary | ICD-10-CM | POA: Insufficient documentation

## 2018-07-18 DIAGNOSIS — G8929 Other chronic pain: Secondary | ICD-10-CM

## 2018-07-19 ENCOUNTER — Encounter: Payer: Self-pay | Admitting: Obstetrics & Gynecology

## 2018-07-19 ENCOUNTER — Ambulatory Visit (INDEPENDENT_AMBULATORY_CARE_PROVIDER_SITE_OTHER): Payer: Medicaid Other | Admitting: Obstetrics & Gynecology

## 2018-07-19 VITALS — Ht 63.0 in | Wt 188.0 lb

## 2018-07-19 DIAGNOSIS — N946 Dysmenorrhea, unspecified: Secondary | ICD-10-CM | POA: Diagnosis not present

## 2018-07-19 DIAGNOSIS — R102 Pelvic and perineal pain: Secondary | ICD-10-CM

## 2018-07-19 DIAGNOSIS — G8929 Other chronic pain: Secondary | ICD-10-CM | POA: Diagnosis not present

## 2018-07-19 NOTE — Progress Notes (Signed)
Virtual Visit via Telephone Note  I connected with Victoria Pierce on 07/19/18 at 11:20 AM EDT by telephone and verified that I am speaking with the correct person using two identifiers.   I discussed the limitations, risks, security and privacy concerns of performing an evaluation and management service by telephone and the availability of in person appointments. I also discussed with the patient that there may be a patient responsible charge related to this service. The patient expressed understanding and agreed to proceed.  She was at home and I was in my office.  History of Present Illness: Pt reports continued pain, this has been chronic and long term, with pain in lower quadrants and deep pelvis worse with periods, associated w nausea, modified w Naproxen mildly, no radiation.  Is on other meds for other pains such as Gabapentin.  No prior hormones or birth control. Has reg but shortened cycles (every 23-25 days over last few mos). Recent US reviewed.  US Pelvic Complete With Transvaginal  Result Date: 07/18/2018 CLINICAL DATA:  Patient with chronic pelvic pain. EXAM: TRANSABDOMINAL AND TRANSVAGINAL ULTRASOUND OF PELVIS TECHNIQUE: Both transabdominal and transvaginal ultrasound examinations of the pelvis were performed. Transabdominal technique was performed for global imaging of the pelvis including uterus, ovaries, adnexal regions, and pelvic cul-de-sac. It was necessary to proceed with endovaginal exam following the transabdominal exam to visualize the endometrium and adnexal structures. COMPARISON:  None FINDINGS: Uterus Measurements: 9.3 x 5.4 x 6.3 cm = volume: 166.0 mL. No fibroids or other mass visualized. Endometrium Thickness: 5 mm.  No focal abnormality visualized. Right ovary Measurements: 3.0 x 2.3 x 1.4 cm = volume: 5.0 mL. Normal appearance/no adnexal mass. Left ovary Measurements: 3.5 x 1.9 x 1.7 cm = volume: 6.0 mL. Normal appearance/no adnexal mass. Other findings No abnormal  free fluid. IMPRESSION: No acute process within the pelvis.   PMHx: She  has a past medical history of Anxiety, Asthma, Bipolar 1 disorder ([place]), Depression, Diverticulitis, GERD (gastroesophageal reflux disease), Hernia, abdominal, Ulcerative colitis, and Vaginal septum. Also,  has a past surgical history that includes Tubal ligation; Closed reduction metacarpal with percutaneous pinning (Left, 03/07/2018); Hardware Removal (Left, 04/03/2018); Esophagogastroduodenoscopy (egd) with propofol (N/A, 05/14/2018); Colonoscopy with propofol (N/A, 05/14/2018); and Trigger finger release (Left, 06/19/2018)., family history includes Hypertension in her sister.,  reports that she has been smoking cigarettes. She has a 27.00 pack-year smoking history. She has never used smokeless tobacco. She reports current drug use. Drug: Marijuana. She reports that she does not drink alcohol.  She has a current medication list which includes the following prescription(s): albuterol, buspirone, cyclobenzaprine, gabapentin, guanfacine, lamotrigine, lurasidone, melatonin, multivitamin with minerals, naproxen, oxycodone, pantoprazole, promethazine, propranolol, topiramate, trazodone, and vitamin b-12. Also, has No Known Allergies.  Review of Systems  Constitutional: Positive for malaise/fatigue. Negative for chills and fever.  HENT: Negative for congestion, sinus pain and sore throat.   Eyes: Negative for blurred vision and pain.  Respiratory: Negative for cough and wheezing.   Cardiovascular: Negative for chest pain and leg swelling.  Gastrointestinal: Positive for nausea. Negative for abdominal pain, constipation, diarrhea, heartburn and vomiting.  Genitourinary: Negative for dysuria, frequency, hematuria and urgency.  Musculoskeletal: Negative for back pain, joint pain, myalgias and neck pain.  Skin: Negative for itching and rash.  Neurological: Negative for dizziness, tremors and weakness.  Endo/Heme/Allergies: Does not  bruise/bleed easily.  Psychiatric/Behavioral: Negative for depression. The patient is nervous/anxious and has insomnia.    Observations/Objective: No exam today, due to telephone eVisit  due to [facility] virus restriction on elective visits and procedures.  Prior visits reviewed along with ultrasounds/labs as indicated.  Assessment and Plan: 1. Chronic pelvic pain in female 2. Dysmenorrhea Options for pill, Depo, and IUD progesterone therapy d/w pt.  Pros and cons, side effects counseled.  Prefers Mirena IUD, will schedule after current period. Surgical options discussed, eval for endometriosis, hysterectomy, etc.  No elective surgeries currently due to [place], so will not plan these at this time.  Review of ULTRASOUND.    I have personally reviewed images and report of recent ultrasound done at [facility].  Plan of management to be discussed with patient.  Follow Up Instructions: IUD appt next week   I discussed the assessment and treatment plan with the patient. The patient was provided an opportunity to ask questions and all were answered. The patient agreed with the plan and demonstrated an understanding of the instructions.   The patient was advised to call back or seek an in-person evaluation if the symptoms worsen or if the condition fails to improve as anticipated.  I provided 12 minutes of non-face-to-face time during this encounter.   Hoyt Koch, MD [facility], [facility] 07/19/2018  11:11 AM

## 2018-07-19 NOTE — Patient Instructions (Signed)

## 2018-07-22 ENCOUNTER — Telehealth: Payer: Self-pay | Admitting: Obstetrics and Gynecology

## 2018-07-22 ENCOUNTER — Other Ambulatory Visit: Payer: Self-pay

## 2018-07-22 ENCOUNTER — Ambulatory Visit: Payer: Medicaid Other | Attending: Surgery | Admitting: Occupational Therapy

## 2018-07-22 DIAGNOSIS — R600 Localized edema: Secondary | ICD-10-CM | POA: Insufficient documentation

## 2018-07-22 DIAGNOSIS — M79642 Pain in left hand: Secondary | ICD-10-CM | POA: Diagnosis present

## 2018-07-22 DIAGNOSIS — M6281 Muscle weakness (generalized): Secondary | ICD-10-CM | POA: Diagnosis present

## 2018-07-22 DIAGNOSIS — M25642 Stiffness of left hand, not elsewhere classified: Secondary | ICD-10-CM | POA: Insufficient documentation

## 2018-07-22 NOTE — Patient Instructions (Signed)
Same but focus on reaching down into hand to engage [place] flexion  And place and hold composite flexion

## 2018-07-22 NOTE — Telephone Encounter (Signed)
Patient is schedule 07/23/18 with Dr. Gilman Schmidt for mirena insertion

## 2018-07-22 NOTE — Therapy (Signed)
[facility] [address] [place], [address] Phone: [phone]   Fax:  [phone]  Occupational Therapy Treatment  Patient Details  Name: Victoria Pierce MRN: [id number] Date of Birth: [birth year] Referring Provider (OT): Poggi   Encounter Date: 07/22/2018  OT End of Session - 07/22/18 1558    Visit Number  4    Number of Visits  12    Date for OT Re-Evaluation  08/13/18    Authorization - Visit Number  4    Authorization - Number of Visits  15    OT Start Time  1500    OT Stop Time  9892    OT Time Calculation (min)  44 min    Activity Tolerance  Patient tolerated treatment well    Behavior During Therapy  [facility] for tasks assessed/performed       Past Medical History:  Diagnosis Date  . Anxiety   . Asthma   . Bipolar 1 disorder ([place])   . Depression   . Diverticulitis   . GERD (gastroesophageal reflux disease)   . Hernia, abdominal   . Ulcerative colitis   . Vaginal septum     Past Surgical History:  Procedure Laterality Date  . CLOSED REDUCTION METACARPAL WITH PERCUTANEOUS PINNING Left 03/07/2018   Procedure: CLOSED REDUCTION METACARPAL WITH PERCUTANEOUS PINNING-5th metacarpal;  Surgeon: Corky Mull, MD;  Location: [facility] ORS;  Service: Orthopedics;  Laterality: Left;  . COLONOSCOPY WITH PROPOFOL N/A 05/14/2018   Procedure: COLONOSCOPY WITH PROPOFOL;  Surgeon: Lin Landsman, MD;  Location: [facility] ENDOSCOPY;  Service: Gastroenterology;  Laterality: N/A;  . ESOPHAGOGASTRODUODENOSCOPY (EGD) WITH PROPOFOL N/A 05/14/2018   Procedure: ESOPHAGOGASTRODUODENOSCOPY (EGD) WITH PROPOFOL;  Surgeon: Lin Landsman, MD;  Location: [facility] ENDOSCOPY;  Service: Gastroenterology;  Laterality: N/A;  . HARDWARE REMOVAL Left 04/03/2018   Procedure: LEFT HAND FIFTH METACARPAL PIN REMOVAL;  Surgeon: Corky Mull, MD;  Location: [place];  Service: Orthopedics;  Laterality: Left;  . TRIGGER FINGER  RELEASE Left 06/19/2018   Procedure: EXTENSOR TENOLYSIS WITH CAPSULAR RELEASE OF LEFT LITTLE MCP JOINT;  Surgeon: Corky Mull, MD;  Location: [place];  Service: Orthopedics;  Laterality: Left;  . TUBAL LIGATION      There were no vitals filed for this visit.  Subjective Assessment - 07/22/18 1515    Subjective   Doing better - seen Dr Roland Rack -and said my blister will come off by itself -and it fell off the next day - the strap from night time splint bother that area and I cannot wear it thru the night     Patient Stated Goals  To get my motion and strength back in my L hand - and bend my pinkie and ring finger     Currently in Pain?  Yes    Pain Score  --   2-5/10   Pain Location  Hand    Pain Orientation  Left    Pain Descriptors / Indicators  Aching;Burning;Tightness;Sore    Pain Type  Surgical pain    Pain Onset  1 to 4 weeks ago         [facility] OT Assessment - 07/22/18 0001      Left Hand AROM   L Ring  MCP 0-90  80 Degrees    L Ring PIP 0-100  80 Degrees   -30   L Little  MCP 0-90  70 Degrees   PROM - 80-85  L Little PIP 0-100  80 Degrees   -50     cont to make great progress in flexion of 5th - and 4th too PIP's still decrease extention - per surgeon can start joint jack on 4th PIP            OT Treatments/Exercises (OP) - 07/22/18 0001      Moist Heat Therapy   Number Minutes Moist Heat  8 Minutes    Moist Heat Location  Hand   flexion wrap to 5th to increase MC flexion SOC       Per pt she did use the kinesiotape  - and irritated skin - to hold off and will reassess next session    PROM and prolonged flexion stretch for MC  With PIP exention after paraffin - increase motion   Assess dynamic flexion splint or stretchthat pt is using  To cont with knuckle bender with 4  band on radial side and 4 on ulnar side - pt report 20-30 min for  slow stretch and  prolonged - pt to use several times during day-  REINFORCe -And can work while in  it on PIP extention  Cont at homePt to 5 x day coban wrap her 5th incompositeflexion while doing heat  5-10 min  PROM for MC flexion , composite flexion  Knuckle bender 5 x day Then place and hold -for composite fist - was able to maintain place and hold at 75-80   And AROM into 2 cm foamblock in palm  and small 1 cm bead     modify straps to  night dorsal hand base splint to  Not irritate where blister scab come off - pt report she cannot tolerate strap on it - take if off during night time  Adjusted and report that feels better -  MC  Flexion at 70 degrees flexion - moldskin done inside of hand for padding over PIP's and MC's - strap for PIP extention        OT Education - 07/22/18 1557    Education Details  adjusted straps on night splint for comfort and  increase flexion at [facility]     Person(s) Educated  Patient    Methods  Demonstration;Verbal cues;Handout    Comprehension  Returned demonstration       OT Short Term Goals - 07/12/18 0951      OT SHORT TERM GOAL #1   Title  Pt to be ind in HEP to decrease edema, pain and increase AROM in 4th ad 5th digit     Baseline  Extention of PIP's 4th -25, 5th -30 ;  flexion 5th MC 30 , PIP 70 ; 4th MC 55 and PIP 80 ; NOW -30 4th , -45 PIP 5th ; MC flexion 4th 80 , 5th 60     Time  2    Period  Weeks    Status  On-going    Target Date  07/23/18      OT SHORT TERM GOAL #2   Title  AROM in L 4thand 5th digit improve for pt to grip 2 cm cylinder - brush, knife     Baseline  in session able to touch or reach to 2 cm object- but not upon arrival -cannot grip     Time  2    Period  Weeks    Status  On-going    Target Date  07/23/18        OT Long Term Goals - 07/12/18  0954      OT LONG TERM GOAL #1   Title  Pain on PRWHE improve with more than 12 points     Baseline  pain at rest 5-6/10 still but pain only increase now with PROM to 10/10 - PRWHE  for pain 46/50     Time  4    Period  Weeks    Status  On-going     Target Date  08/13/18      OT LONG TERM GOAL #2   Title  AROM in 4thand 5th improve for pt to touch palm to hold small object in palm     Baseline  see baseline at goal 1 STG- and see progress at STG 1    Time  4    Period  Weeks    Status  On-going    Target Date  08/13/18      OT LONG TERM GOAL #3   Title  Grip strength in L improve to more than 50% to carry more than 5 lbs, cut with knife ,and squeeze washcloth     Baseline   NT - pt only 3 1/2 wks s/p    Time  4    Period  Weeks    Status  New    Target Date  08/13/18            Plan - 07/22/18 1559    Clinical Impression Statement  Pt present 5 wks s/p 5th extensor tenolysis and capsular release  - cont to show increase progress in flexion of 5th MC - this date 70 coming in and in session PROM 85 - and AROM 75 - pt to work on composite flexion -and place and hold - blister come off - tender over that area - and per surgeon can start using joint jack on 4th PIP     OT Occupational Profile and History  Problem Focused Assessment - Including review of records relating to presenting problem    Occupational performance deficits (Please refer to evaluation for details):  ADL's;IADL's;Leisure;Play    Body Structure / Function / Physical Skills  ADL;Dexterity;Flexibility;ROM;Strength;Scar mobility;FMC;Edema;Coordination;Pain;UE functional use    Rehab Potential  Good    Clinical Decision Making  Several treatment options, min-mod task modification necessary    Comorbidities Affecting Occupational Performance:  May have comorbidities impacting occupational performance    OT Frequency  2x / week    OT Duration  4 weeks    OT Treatment/Interventions  Self-care/ADL training;Cryotherapy;Paraffin;Therapeutic exercise;Splinting;Scar mobilization;Manual Therapy;Fluidtherapy;Contrast Bath;Passive range of motion;Patient/family education    Plan  assess progress with doing HEP 5 x day . splinting , flexion wrap     OT Home Exercise Plan  see  pt instruction     Consulted and Agree with Plan of Care  Patient       Patient will benefit from skilled therapeutic intervention in order to improve the following deficits and impairments:  Body Structure / Function / Physical Skills  Visit Diagnosis: Stiffness of left hand, not elsewhere classified  Pain in left hand  Localized edema  Muscle weakness (generalized)    Problem List Patient Active Problem List   Diagnosis Date Noted  . Chronic pelvic pain in female 06/14/2018  . Dysmenorrhea 06/14/2018  . Menorrhagia with regular cycle 06/14/2018  . Stiffness of finger joint, 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  . Closed displaced fracture of neck of fifth metacarpal bone of left hand 03/07/2018  . Ulcerative colitis   . GERD (gastroesophageal reflux disease)   .  Anxiety     Rosalyn Gess OTR/L,CLT 07/22/2018, 4:01 PM  [facility] [address], [place], [address] Phone: [phone]   Fax:  [phone]  Name: Victoria Pierce MRN: [id number] Date of Birth: Feb 15, 1981

## 2018-07-22 NOTE — Telephone Encounter (Signed)
-----   Message from Gae Dry, MD sent at 07/19/2018 11:10 AM EDT ----- Regarding: Sch appt for IUD w PH next week Mirena

## 2018-07-23 ENCOUNTER — Ambulatory Visit (INDEPENDENT_AMBULATORY_CARE_PROVIDER_SITE_OTHER): Payer: Medicaid Other | Admitting: Obstetrics and Gynecology

## 2018-07-23 ENCOUNTER — Encounter: Payer: Self-pay | Admitting: Obstetrics and Gynecology

## 2018-07-23 VITALS — BP 110/70 | HR 93 | Ht 63.0 in | Wt 188.0 lb

## 2018-07-23 DIAGNOSIS — Z30430 Encounter for insertion of intrauterine contraceptive device: Secondary | ICD-10-CM

## 2018-07-23 MED ORDER — LEVONORGESTREL 20 MCG/24HR IU IUD: INTRAUTERINE_SYSTEM | Freq: Once | INTRAUTERINE | Status: AC

## 2018-07-23 NOTE — Progress Notes (Signed)
  IUD PROCEDURE NOTE:  Victoria Pierce is a 38 y.o. [id number] here for IUD insertion. No GYN concerns.  Last pap smear was normal.  IUD Insertion Procedure Note Patient identified, informed consent performed, consent signed.   Discussed risks of irregular bleeding, cramping, infection, malpositioning or misplacement of the IUD outside the uterus which may require further procedure such as laparoscopy, risk of failure <1%. Time out was performed.  Urine pregnancy test negative.  A bimanual exam showed the uterus to be anteverted.  Speculum placed in the vagina.  Cervix visualized.  Cleaned with Betadine x 2.  Grasped anteriorly with a single tooth tenaculum.  Uterus sounded to 10 cm.   IUD placed per manufacturer's recommendations.  Strings trimmed to 3 cm. Tenaculum was removed, good hemostasis noted.  Patient tolerated procedure well.   Patient was given post-procedure instructions.  She was advised to have backup contraception for one week.  Patient was also asked to check IUD strings periodically and follow up in 4 weeks for IUD check.  Adrian Prows MD [facility] 07/23/2018 9:06 AM

## 2018-07-23 NOTE — Patient Instructions (Signed)

## 2018-07-24 ENCOUNTER — Ambulatory Visit: Payer: Medicaid Other | Admitting: Occupational Therapy

## 2018-07-24 ENCOUNTER — Other Ambulatory Visit: Payer: Self-pay

## 2018-07-24 DIAGNOSIS — M25642 Stiffness of left hand, not elsewhere classified: Secondary | ICD-10-CM

## 2018-07-24 DIAGNOSIS — R600 Localized edema: Secondary | ICD-10-CM

## 2018-07-24 DIAGNOSIS — M79642 Pain in left hand: Secondary | ICD-10-CM

## 2018-07-24 DIAGNOSIS — M6281 Muscle weakness (generalized): Secondary | ICD-10-CM

## 2018-07-24 NOTE — Therapy (Signed)
[facility] [address], [place], [address] Phone: [phone]   Fax:  [phone]  Occupational Therapy Treatment  Patient Details  Name: Victoria Pierce MRN: [id number] Date of Birth: [birth year] Referring Provider (OT): Poggi   Encounter Date: 07/24/2018  OT End of Session - 07/24/18 1714    Visit Number  5    Number of Visits  12    Date for OT Re-Evaluation  08/13/18    Authorization - Visit Number  5    Authorization - Number of Visits  15    OT Start Time  1600    OT Stop Time  4920    OT Time Calculation (min)  53 min    Activity Tolerance  Patient tolerated treatment well    Behavior During Therapy  [facility] for tasks assessed/performed       Past Medical History:  Diagnosis Date  . Anxiety   . Asthma   . Bipolar 1 disorder ([place])   . Depression   . Diverticulitis   . GERD (gastroesophageal reflux disease)   . Hernia, abdominal   . Ulcerative colitis   . Vaginal septum     Past Surgical History:  Procedure Laterality Date  . CLOSED REDUCTION METACARPAL WITH PERCUTANEOUS PINNING Left 03/07/2018   Procedure: CLOSED REDUCTION METACARPAL WITH PERCUTANEOUS PINNING-5th metacarpal;  Surgeon: Corky Mull, MD;  Location: [facility] ORS;  Service: Orthopedics;  Laterality: Left;  . COLONOSCOPY WITH PROPOFOL N/A 05/14/2018   Procedure: COLONOSCOPY WITH PROPOFOL;  Surgeon: Lin Landsman, MD;  Location: [facility] ENDOSCOPY;  Service: Gastroenterology;  Laterality: N/A;  . ESOPHAGOGASTRODUODENOSCOPY (EGD) WITH PROPOFOL N/A 05/14/2018   Procedure: ESOPHAGOGASTRODUODENOSCOPY (EGD) WITH PROPOFOL;  Surgeon: Lin Landsman, MD;  Location: [facility] ENDOSCOPY;  Service: Gastroenterology;  Laterality: N/A;  . HARDWARE REMOVAL Left 04/03/2018   Procedure: LEFT HAND FIFTH METACARPAL PIN REMOVAL;  Surgeon: Corky Mull, MD;  Location: [place];  Service: Orthopedics;  Laterality: Left;  . TRIGGER FINGER  RELEASE Left 06/19/2018   Procedure: EXTENSOR TENOLYSIS WITH CAPSULAR RELEASE OF LEFT LITTLE MCP JOINT;  Surgeon: Corky Mull, MD;  Location: [place];  Service: Orthopedics;  Laterality: Left;  . TUBAL LIGATION      There were no vitals filed for this visit.  Subjective Assessment - 07/24/18 1617    Subjective   My hand is getting better- feels tender over ring finger knuckle but had been like for while- uisng it more like washing my hair and gripping  cup with all digits     Patient Stated Goals  To get my motion and strength back in my L hand - and bend my pinkie and ring finger     Currently in Pain?  Yes    Pain Score  3    going up to 5 during HEP    Pain Location  Hand    Pain Orientation  Left    Pain Descriptors / Indicators  Aching;Burning;Tightness;Tender    Pain Type  Surgical pain    Pain Onset  1 to 4 weeks ago         [facility] OT Assessment - 07/24/18 0001      Left Hand AROM   L Ring  MCP 0-90  90 Degrees    L Ring PIP 0-100  95 Degrees   -30   L Little  MCP 0-90  70 Degrees   AROM in session 75 and PROM 90  L Little PIP 0-100  --   85 and -45 to -50 after graston -30               OT Treatments/Exercises (OP) - 07/24/18 0001      LUE Fluidotherapy   Number Minutes Fluidotherapy  8 Minutes    LUE Fluidotherapy Location  Hand    Comments  AROM tendon glides and fisting in 2 cm foamblock        PROM and prolonged flexion stretch for MC  With PIP exention afterfluido - increase motion  PLace and hold done Composite fist   done graston tool nr 2 on volar 4th and 5th prior to some PROM for PIP extention And traction on  4th PIP with some joint mobs   Assess dynamic flexion splint or stretchthat pt is using  To cont with knuckle bender with 4  band on radial side and 4 on ulnar side - ptreport 20-30 min forslow stretch andprolonged - pt to use several times during day-  REINFORCe -And can work while in it on PIP  extention  Cont at homePt to 5 x day coban wrap her 5th incompositeflexion while doing heat  5-10 min  PROM for MC flexion , composite flexion  Knuckle bender 5 x day Then place and hold -for composite fist - was able to maintain place and hold at 75-80   AndAROM into 2 cm foamblock in palm   modify  night dorsal hand base splint to increase MC flexion for night time   Adjusted and report that feels better - MC Flexion at 80 degrees flexion - moldskin done inside of hand for padding over PIP's and MC's - strap for PIP extention       OT Education - 07/24/18 1714    Education Details  progress, POC and modify night splint to increase MC flexion     Person(s) Educated  Patient    Methods  Demonstration;Verbal cues;Handout    Comprehension  Returned demonstration       OT Short Term Goals - 07/12/18 0951      OT SHORT TERM GOAL #1   Title  Pt to be ind in HEP to decrease edema, pain and increase AROM in 4th ad 5th digit     Baseline  Extention of PIP's 4th -25, 5th -30 ;  flexion 5th MC 30 , PIP 70 ; 4th MC 55 and PIP 80 ; NOW -30 4th , -45 PIP 5th ; MC flexion 4th 80 , 5th 60     Time  2    Period  Weeks    Status  On-going    Target Date  07/23/18      OT SHORT TERM GOAL #2   Title  AROM in L 4thand 5th digit improve for pt to grip 2 cm cylinder - brush, knife     Baseline  in session able to touch or reach to 2 cm object- but not upon arrival -cannot grip     Time  2    Period  Weeks    Status  On-going    Target Date  07/23/18        OT Long Term Goals - 07/12/18 0954      OT LONG TERM GOAL #1   Title  Pain on PRWHE improve with more than 12 points     Baseline  pain at rest 5-6/10 still but pain only increase now with PROM to 10/10 - PRWHE  for  pain 46/50     Time  4    Period  Weeks    Status  On-going    Target Date  08/13/18      OT LONG TERM GOAL #2   Title  AROM in 4thand 5th improve for pt to touch palm to hold small object in palm      Baseline  see baseline at goal 1 STG- and see progress at STG 1    Time  4    Period  Weeks    Status  On-going    Target Date  08/13/18      OT LONG TERM GOAL #3   Title  Grip strength in L improve to more than 50% to carry more than 5 lbs, cut with knife ,and squeeze washcloth     Baseline   NT - pt only 3 1/2 wks s/p    Time  4    Period  Weeks    Status  New    Target Date  08/13/18            Plan - 07/24/18 1715    Clinical Impression Statement  Pt present about 5 1/2 wks s/p 5th extensory tenolysi and capsular release - cont to make progress in AROM and PROM - and pain - initiating some extention of 4th PIP more than 5th - was able to touch palm this date after PROM  and end of session    OT Occupational Profile and History  Problem Focused Assessment - Including review of records relating to presenting problem    Occupational performance deficits (Please refer to evaluation for details):  ADL's;IADL's;Leisure;Play    Body Structure / Function / Physical Skills  ADL;Dexterity;Flexibility;ROM;Strength;Scar mobility;FMC;Edema;Coordination;Pain;UE functional use    Rehab Potential  Good    Clinical Decision Making  Several treatment options, min-mod task modification necessary    Comorbidities Affecting Occupational Performance:  May have comorbidities impacting occupational performance    OT Frequency  2x / week    OT Duration  4 weeks    OT Treatment/Interventions  Self-care/ADL training;Cryotherapy;Paraffin;Therapeutic exercise;Splinting;Scar mobilization;Manual Therapy;Fluidtherapy;Contrast Bath;Passive range of motion;Patient/family education    Plan  assess progress with doing HEP 5 x day . splinting , flexion wrap     OT Home Exercise Plan  see pt instruction     Consulted and Agree with Plan of Care  Patient       Patient will benefit from skilled therapeutic intervention in order to improve the following deficits and impairments:  Body Structure / Function /  Physical Skills  Visit Diagnosis: Stiffness of left hand, not elsewhere classified  Pain in left hand  Localized edema  Muscle weakness (generalized)    Problem List Patient Active Problem List   Diagnosis Date Noted  . Chronic pelvic pain in female 06/14/2018  . Dysmenorrhea 06/14/2018  . Menorrhagia with regular cycle 06/14/2018  . Stiffness of finger joint, 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  . Closed displaced fracture of neck of fifth metacarpal bone of left hand 03/07/2018  . Ulcerative colitis   . GERD (gastroesophageal reflux disease)   . Anxiety     Rosalyn Gess OTR/L,CLT 07/24/2018, 5:17 PM  [facility] [address] [place], [address] Phone: [phone]   Fax:  [phone]  Name: CHEYLA DUCHEMIN MRN: [id number] Date of Birth: 12-06-1980

## 2018-07-24 NOTE — Patient Instructions (Signed)
Same - can do gentle traction on 4th PIP prior to ROM

## 2018-07-25 NOTE — Telephone Encounter (Signed)
Pt rcvd/charged Mirena & insertion 07/23/2018

## 2018-07-29 ENCOUNTER — Ambulatory Visit: Payer: Medicaid Other | Admitting: Occupational Therapy

## 2018-07-29 ENCOUNTER — Other Ambulatory Visit: Payer: Self-pay

## 2018-07-29 DIAGNOSIS — R600 Localized edema: Secondary | ICD-10-CM

## 2018-07-29 DIAGNOSIS — M25642 Stiffness of left hand, not elsewhere classified: Secondary | ICD-10-CM | POA: Diagnosis not present

## 2018-07-29 DIAGNOSIS — M79642 Pain in left hand: Secondary | ICD-10-CM

## 2018-07-29 DIAGNOSIS — M6281 Muscle weakness (generalized): Secondary | ICD-10-CM

## 2018-07-29 NOTE — Therapy (Signed)
[place] [facility] [address] [place], [address] Phone: [phone]   Fax:  [phone]  Occupational Therapy Treatment  Patient Details  Name: Victoria Pierce MRN: [id number] Date of Birth: 1980/07/19 Referring Provider (OT): Poggi   Encounter Date: 07/29/2018  OT End of Session - 07/29/18 0914    Visit Number  6    Number of Visits  12    Date for OT Re-Evaluation  08/13/18    Authorization - Visit Number  6    Authorization - Number of Visits  15    OT Start Time  0900    OT Stop Time  0943    OT Time Calculation (min)  43 min    Activity Tolerance  Patient tolerated treatment well    Behavior During Therapy  [facility] for tasks assessed/performed       Past Medical History:  Diagnosis Date  . Anxiety   . Asthma   . Bipolar 1 disorder ([place])   . Depression   . Diverticulitis   . GERD (gastroesophageal reflux disease)   . Hernia, abdominal   . Ulcerative colitis   . Vaginal septum     Past Surgical History:  Procedure Laterality Date  . CLOSED REDUCTION METACARPAL WITH PERCUTANEOUS PINNING Left 03/07/2018   Procedure: CLOSED REDUCTION METACARPAL WITH PERCUTANEOUS PINNING-5th metacarpal;  Surgeon: Corky Mull, MD;  Location: [facility] ORS;  Service: Orthopedics;  Laterality: Left;  . COLONOSCOPY WITH PROPOFOL N/A 05/14/2018   Procedure: COLONOSCOPY WITH PROPOFOL;  Surgeon: Lin Landsman, MD;  Location: [facility] ENDOSCOPY;  Service: Gastroenterology;  Laterality: N/A;  . ESOPHAGOGASTRODUODENOSCOPY (EGD) WITH PROPOFOL N/A 05/14/2018   Procedure: ESOPHAGOGASTRODUODENOSCOPY (EGD) WITH PROPOFOL;  Surgeon: Lin Landsman, MD;  Location: [facility] ENDOSCOPY;  Service: Gastroenterology;  Laterality: N/A;  . HARDWARE REMOVAL Left 04/03/2018   Procedure: LEFT HAND FIFTH METACARPAL PIN REMOVAL;  Surgeon: Corky Mull, MD;  Location: [place];  Service: Orthopedics;  Laterality: Left;  . TRIGGER FINGER  RELEASE Left 06/19/2018   Procedure: EXTENSOR TENOLYSIS WITH CAPSULAR RELEASE OF LEFT LITTLE MCP JOINT;  Surgeon: Corky Mull, MD;  Location: [place];  Service: Orthopedics;  Laterality: Left;  . TUBAL LIGATION      There were no vitals filed for this visit.  Subjective Assessment - 07/29/18 0900    Subjective   I forgot to sleep with my splint and with this rainy weather I can tell it is more stiff , ache - I probable going to have more issues in the future with rainy and cold weather     Patient Stated Goals  To get my motion and strength back in my L hand - and bend my pinkie and ring finger     Currently in Pain?  Yes    Pain Score  5     Pain Location  Hand    Pain Orientation  Left    Pain Descriptors / Indicators  Aching    Pain Type  Surgical pain    Pain Onset  1 to 4 weeks ago    Pain Frequency  Constant         [facility] OT Assessment - 07/29/18 0001      Left Hand AROM   L Ring  MCP 0-90  90 Degrees    L Ring PIP 0-100  95 Degrees   -30   L Little  MCP 0-90  70 Degrees   82 PROM  SOC - AROM 80 and PROM 90 in session    L Little PIP 0-100  80 Degrees   -30       Pt cont to make progress in AROM and PROM at 5th digit MC   and then started addressing PIP extention last week - pt using joint jack on 4th - but not to use on 5th - need to cont to focus on MC flexion  But did improve since last time in PIP extention         OT Treatments/Exercises (OP) - 07/29/18 0001      LUE Fluidotherapy   Number Minutes Fluidotherapy  8 Minutes    LUE Fluidotherapy Location  Hand    Comments  AROM for tendon glides      Scar massage done using xtractor and vibration over dorsal 5th MC - and provided new cica scar pad and silicon digi sleeve for night time use    PROM and prolonged flexion stretch for MCWith PIP exention afterfluido- increase motion  PLace and hold done Composite fist   done graston tool nr 2 on volar 4th and 5th prior to some PROM for PIP  extention And traction on  4th PIP with some joint mobs   Assess dynamic flexion splint or stretchthat pt is using- to add 4th band on ulnar side- at least 10-15 min - and can if need drop down to 3 for rest of 30 min   ptreport 20-30 min forslow stretch andprolonged - pt to use several times during day-  REINFORCe -And can work while in it on PIP extention  Cont at homePt to 5 x day coban wrap her 5th incompositeflexion while doing heat  5-10 min  PROM for MC flexion , composite flexion  Knuckle bender 5 x day Then place and hold -for composite fist - was able to do AROM at 80-82 this date in session and PROM 90  PIP exention at 4th and 5th 30 this date   AndAROMinto 2 cm foamblock in palmdone with extention inbetween   reinforce night dorsal hand base splint to increase MC flexion for night time   MC Flexion at 80 degrees flexion - moldskin done inside of hand for padding over PIP's and MC's- strap for PIP extention        OT Education - 07/29/18 0914    Education Details  progress, POC and  increase one more rubber band on dynamic splint and cont to wear  night splint to increase MC flexion     Person(s) Educated  Patient    Methods  Demonstration;Verbal cues;Handout    Comprehension  Returned demonstration       OT Short Term Goals - 07/12/18 0951      OT SHORT TERM GOAL #1   Title  Pt to be ind in HEP to decrease edema, pain and increase AROM in 4th ad 5th digit     Baseline  Extention of PIP's 4th -25, 5th -30 ;  flexion 5th MC 30 , PIP 70 ; 4th MC 55 and PIP 80 ; NOW -30 4th , -45 PIP 5th ; MC flexion 4th 80 , 5th 60     Time  2    Period  Weeks    Status  On-going    Target Date  07/23/18      OT SHORT TERM GOAL #2   Title  AROM in L 4thand 5th digit improve for pt to grip 2 cm cylinder - brush,  knife     Baseline  in session able to touch or reach to 2 cm object- but not upon arrival -cannot grip     Time  2    Period  Weeks     Status  On-going    Target Date  07/23/18        OT Long Term Goals - 07/12/18 0954      OT LONG TERM GOAL #1   Title  Pain on PRWHE improve with more than 12 points     Baseline  pain at rest 5-6/10 still but pain only increase now with PROM to 10/10 - PRWHE  for pain 46/50     Time  4    Period  Weeks    Status  On-going    Target Date  08/13/18      OT LONG TERM GOAL #2   Title  AROM in 4thand 5th improve for pt to touch palm to hold small object in palm     Baseline  see baseline at goal 1 STG- and see progress at STG 1    Time  4    Period  Weeks    Status  On-going    Target Date  08/13/18      OT LONG TERM GOAL #3   Title  Grip strength in L improve to more than 50% to carry more than 5 lbs, cut with knife ,and squeeze washcloth     Baseline   NT - pt only 3 1/2 wks s/p    Time  4    Period  Weeks    Status  New    Target Date  08/13/18            Plan - 07/29/18 0914    Clinical Impression Statement  Pt is about 6 wks s/p 5th extensor tenolysis and capsular release - pt did not sleep with her hand base dorsal flexion at [facility]  splint - but appear to cont to make progress in flexion of 5th and 5th MC's- and PIP's extention improving - did  had her increase rubberband on knucklebender for  MC flexion at 5th at least the first 10-15 min at each session- and did initite some PIP exention last session     OT Occupational Profile and History  Problem Focused Assessment - Including review of records relating to presenting problem    Occupational performance deficits (Please refer to evaluation for details):  ADL's;IADL's;Leisure;Play    Body Structure / Function / Physical Skills  ADL;Dexterity;Flexibility;ROM;Strength;Scar mobility;FMC;Edema;Coordination;Pain;UE functional use    Rehab Potential  Good    Clinical Decision Making  Several treatment options, min-mod task modification necessary    Comorbidities Affecting Occupational Performance:  May have comorbidities  impacting occupational performance    OT Frequency  2x / week    OT Duration  4 weeks    OT Treatment/Interventions  Self-care/ADL training;Cryotherapy;Paraffin;Therapeutic exercise;Splinting;Scar mobilization;Manual Therapy;Fluidtherapy;Contrast Bath;Passive range of motion;Patient/family education    Plan  assess progress with doing HEP 5 x day . splinting , flexion wrap     OT Home Exercise Plan  see pt instruction     Consulted and Agree with Plan of Care  Patient       Patient will benefit from skilled therapeutic intervention in order to improve the following deficits and impairments:  Body Structure / Function / Physical Skills  Visit Diagnosis: Stiffness of left hand, not elsewhere classified  Pain in left hand  Localized edema  Muscle weakness (generalized)    Problem List Patient Active Problem List   Diagnosis Date Noted  . Chronic pelvic pain in female 06/14/2018  . Dysmenorrhea 06/14/2018  . Menorrhagia with regular cycle 06/14/2018  . Stiffness of finger joint, 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  . Closed displaced fracture of neck of fifth metacarpal bone of left hand 03/07/2018  . Ulcerative colitis   . GERD (gastroesophageal reflux disease)   . Anxiety     Rosalyn Gess OTR/L,CLT 07/29/2018, 11:32 AM  [place] [facility] [address], [place], [address] Phone: [phone]   Fax:  [phone]  Name: Victoria Pierce MRN: [id number] Date of Birth: 10/22/1980

## 2018-07-29 NOTE — Patient Instructions (Signed)
Same - but increase one more rubber band on 5th MC flexion

## 2018-07-31 ENCOUNTER — Other Ambulatory Visit: Payer: Self-pay

## 2018-07-31 ENCOUNTER — Ambulatory Visit: Payer: Medicaid Other | Admitting: Occupational Therapy

## 2018-07-31 DIAGNOSIS — M25642 Stiffness of left hand, not elsewhere classified: Secondary | ICD-10-CM | POA: Diagnosis not present

## 2018-07-31 DIAGNOSIS — M79642 Pain in left hand: Secondary | ICD-10-CM

## 2018-07-31 DIAGNOSIS — R600 Localized edema: Secondary | ICD-10-CM

## 2018-07-31 DIAGNOSIS — M6281 Muscle weakness (generalized): Secondary | ICD-10-CM

## 2018-07-31 NOTE — Patient Instructions (Signed)
Same

## 2018-07-31 NOTE — Therapy (Signed)
[place] [facility] [address] [place], [address] Phone: [phone]   Fax:  [phone]  Occupational Therapy Treatment  Patient Details  Name: Victoria Pierce MRN: [id number] Date of Birth: 22-Aug-1980 Referring Provider (OT): Poggi   Encounter Date: 07/31/2018  OT End of Session - 07/31/18 0919    Visit Number  7    Number of Visits  12    Date for OT Re-Evaluation  08/13/18    Authorization - Visit Number  7    Authorization - Number of Visits  15    OT Start Time  0905    OT Stop Time  0948    OT Time Calculation (min)  43 min    Activity Tolerance  Patient tolerated treatment well    Behavior During Therapy  [facility] for tasks assessed/performed       Past Medical History:  Diagnosis Date  . Anxiety   . Asthma   . Bipolar 1 disorder ([place])   . Depression   . Diverticulitis   . GERD (gastroesophageal reflux disease)   . Hernia, abdominal   . Ulcerative colitis   . Vaginal septum     Past Surgical History:  Procedure Laterality Date  . CLOSED REDUCTION METACARPAL WITH PERCUTANEOUS PINNING Left 03/07/2018   Procedure: CLOSED REDUCTION METACARPAL WITH PERCUTANEOUS PINNING-5th metacarpal;  Surgeon: Corky Mull, MD;  Location: [facility] ORS;  Service: Orthopedics;  Laterality: Left;  . COLONOSCOPY WITH PROPOFOL N/A 05/14/2018   Procedure: COLONOSCOPY WITH PROPOFOL;  Surgeon: Lin Landsman, MD;  Location: [facility] ENDOSCOPY;  Service: Gastroenterology;  Laterality: N/A;  . ESOPHAGOGASTRODUODENOSCOPY (EGD) WITH PROPOFOL N/A 05/14/2018   Procedure: ESOPHAGOGASTRODUODENOSCOPY (EGD) WITH PROPOFOL;  Surgeon: Lin Landsman, MD;  Location: [facility] ENDOSCOPY;  Service: Gastroenterology;  Laterality: N/A;  . HARDWARE REMOVAL Left 04/03/2018   Procedure: LEFT HAND FIFTH METACARPAL PIN REMOVAL;  Surgeon: Corky Mull, MD;  Location: [place];  Service: Orthopedics;  Laterality: Left;  . TRIGGER FINGER  RELEASE Left 06/19/2018   Procedure: EXTENSOR TENOLYSIS WITH CAPSULAR RELEASE OF LEFT LITTLE MCP JOINT;  Surgeon: Corky Mull, MD;  Location: [place];  Service: Orthopedics;  Laterality: Left;  . TUBAL LIGATION      There were no vitals filed for this visit.  Subjective Assessment - 07/31/18 0916    Subjective   Since last time - look  I can nearly touch my palm - I could pick up 2 cans with my L hand - very happy with my Ring finger too  - the little finger  not straigten     Patient Stated Goals  To get my motion and strength back in my L hand - and bend my pinkie and ring finger     Currently in Pain?  Yes    Pain Score  3     Pain Location  Hand    Pain Orientation  Left    Pain Descriptors / Indicators  Aching    Pain Type  Surgical pain    Pain Onset  1 to 4 weeks ago         [facility] OT Assessment - 07/31/18 0001      Strength   Right Hand Grip (lbs)  83    Right Hand Lateral Pinch  17 lbs    Right Hand 3 Point Pinch  22 lbs    Left Hand Grip (lbs)  55    Left Hand Lateral  Pinch  15 lbs    Left Hand 3 Point Pinch  17 lbs      Left Hand AROM   L Ring  MCP 0-90  90 Degrees    L Ring PIP 0-100  95 Degrees   -28   L Little  MCP 0-90  76 Degrees   PROM 90 coming in    L Little PIP 0-100  90 Degrees   -40 coming in       Pt cont to make great progress in MC flexion of 5th -  And improvement at 4th [facility] and PIP - and 5th PIP  Grip and prehension assess this date   Scar tissue still adhere over [facility] but improving          OT Treatments/Exercises (OP) - 07/31/18 0001      LUE Fluidotherapy   Number Minutes Fluidotherapy  8 Minutes    LUE Fluidotherapy Location  Hand    Comments  AROM digits flexion /extention        Scar massage done using vibration over dorsal 5th MC - and cont  cica scar pad and silicon digi sleeve for night time use    PROM and prolonged flexion stretch for MCWith PIP exention afterfluido- increase motion  PLace and hold  done Composite fist  done graston tool nr 2 on volar 4th and 5th prior to some PROM for PIP extention And traction on 4th PIP with some joint mobs   Assess dynamic flexion splint or stretchthat pt is using- cont since last time  4th band on ulnar side- at least 20  min - and can if need drop down to 3 for rest of 30 min   ptreport 20-30 min forslow stretch andprolonged - pt to use several times during day-  REINFORCe -And can work while in it on PIP extention  Cont at homePt to 5 x day coban wrap her 5th incompositeflexion while doing heat  5-10 min  PROM for MC flexion , composite flexion  Knuckle bender 5 x day Then place and hold -for composite fist - was able to do AROM at 80-82 this date in session and PROM 90  PIP exention at 4th and 5th 30 this date   AndAROMinto 1 cm foamblock wit 5th  into palmdone with extention inbetween   reinforce night dorsal hand base splint toincrease MC flexion for night time MC Flexion at 80 degrees flexion - moldskin done inside of hand for padding over PIP's and MC's- strap for PIP extention Need to adjust next time to increase lfexion  And initiate extention to 5th PIP - assess if can do parafffin to 5th - where her blister was          OT Education - 07/31/18 0919    Education Details  Still focus on flexion at 5th more than extention of PIP -but can do joint jack at 4th PIP while working on flexion     Person(s) Educated  Patient    Methods  Demonstration;Verbal cues;Handout    Comprehension  Returned demonstration       OT Short Term Goals - 07/12/18 0951      OT SHORT TERM GOAL #1   Title  Pt to be ind in HEP to decrease edema, pain and increase AROM in 4th ad 5th digit     Baseline  Extention of PIP's 4th -25, 5th -30 ;  flexion 5th MC 30 , PIP 70 ; 4th MC 55 and PIP  80 ; NOW -30 4th , -45 PIP 5th ; MC flexion 4th 80 , 5th 60     Time  2    Period  Weeks    Status  On-going    Target Date   07/23/18      OT SHORT TERM GOAL #2   Title  AROM in L 4thand 5th digit improve for pt to grip 2 cm cylinder - brush, knife     Baseline  in session able to touch or reach to 2 cm object- but not upon arrival -cannot grip     Time  2    Period  Weeks    Status  On-going    Target Date  07/23/18        OT Long Term Goals - 07/12/18 0954      OT LONG TERM GOAL #1   Title  Pain on PRWHE improve with more than 12 points     Baseline  pain at rest 5-6/10 still but pain only increase now with PROM to 10/10 - PRWHE  for pain 46/50     Time  4    Period  Weeks    Status  On-going    Target Date  08/13/18      OT LONG TERM GOAL #2   Title  AROM in 4thand 5th improve for pt to touch palm to hold small object in palm     Baseline  see baseline at goal 1 STG- and see progress at STG 1    Time  4    Period  Weeks    Status  On-going    Target Date  08/13/18      OT LONG TERM GOAL #3   Title  Grip strength in L improve to more than 50% to carry more than 5 lbs, cut with knife ,and squeeze washcloth     Baseline   NT - pt only 3 1/2 wks s/p    Time  4    Period  Weeks    Status  New    Target Date  08/13/18            Plan - 07/31/18 0919    Clinical Impression Statement  Pt is 6 1/2 wks s/p L  5th extensor tenolysis and capsular release - making great progress everytime she comes in with flexion of 5th MC  AROM and PROM  -and flexion of 4th and 5th PIP - did initiat to increase PIP extention of 4thadn 5th- but more on 4th - pt to not focus on that until we have AROM of 5th in the 80's walking in - she as 75 this date , in session place and hold 85 and PROM 90 - extention of PIP in session able to get -20  - pt can benefit from cont OT services      OT Occupational Profile and History  Problem Focused Assessment - Including review of records relating to presenting problem    Occupational performance deficits (Please refer to evaluation for details):  ADL's;IADL's;Leisure;Play     Body Structure / Function / Physical Skills  ADL;Dexterity;Flexibility;ROM;Strength;Scar mobility;FMC;Edema;Coordination;Pain;UE functional use    Rehab Potential  Good    Clinical Decision Making  Several treatment options, min-mod task modification necessary    Comorbidities Affecting Occupational Performance:  May have comorbidities impacting occupational performance    OT Frequency  2x / week    OT Duration  4 weeks    OT Treatment/Interventions  Self-care/ADL training;Cryotherapy;Paraffin;Therapeutic exercise;Splinting;Scar mobilization;Manual Therapy;Fluidtherapy;Contrast Bath;Passive range of motion;Patient/family education    Plan  assess progress and initiate extention of PIP more -and ?paraffin if skin on 5th good     OT Home Exercise Plan  see pt instruction     Consulted and Agree with Plan of Care  Patient       Patient will benefit from skilled therapeutic intervention in order to improve the following deficits and impairments:  Body Structure / Function / Physical Skills  Visit Diagnosis: Stiffness of left hand, not elsewhere classified  Pain in left hand  Localized edema  Muscle weakness (generalized)    Problem List Patient Active Problem List   Diagnosis Date Noted  . Chronic pelvic pain in female 06/14/2018  . Dysmenorrhea 06/14/2018  . Menorrhagia with regular cycle 06/14/2018  . Stiffness of finger joint, 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  . Closed displaced fracture of neck of fifth metacarpal bone of left hand 03/07/2018  . Ulcerative colitis   . GERD (gastroesophageal reflux disease)   . Anxiety     Rosalyn Gess OTR/L,CLT 07/31/2018, 11:23 AM  [place] [facility] [address] [place], [address] Phone: [phone]   Fax:  [phone]  Name: Victoria Pierce MRN: [id number] Date of Birth: Mar 14, 1981

## 2018-08-01 ENCOUNTER — Encounter: Admission: RE | Payer: Self-pay | Source: Home / Self Care

## 2018-08-01 ENCOUNTER — Inpatient Hospital Stay: Admission: RE | Admit: 2018-08-01 | Payer: Medicaid Other | Source: Home / Self Care | Admitting: Surgery

## 2018-08-01 SURGERY — ROBOTIC ASSISTED LAPAROSCOPIC NISSEN FUNDOPLICATION
Anesthesia: General

## 2018-08-05 ENCOUNTER — Ambulatory Visit: Payer: Medicaid Other | Admitting: Occupational Therapy

## 2018-08-06 ENCOUNTER — Other Ambulatory Visit: Payer: Self-pay

## 2018-08-06 ENCOUNTER — Ambulatory Visit: Payer: Medicaid Other | Admitting: Occupational Therapy

## 2018-08-06 DIAGNOSIS — M25642 Stiffness of left hand, not elsewhere classified: Secondary | ICD-10-CM | POA: Diagnosis not present

## 2018-08-06 DIAGNOSIS — M6281 Muscle weakness (generalized): Secondary | ICD-10-CM

## 2018-08-06 DIAGNOSIS — M79642 Pain in left hand: Secondary | ICD-10-CM

## 2018-08-06 DIAGNOSIS — R600 Localized edema: Secondary | ICD-10-CM

## 2018-08-06 NOTE — Therapy (Signed)
[place] [facility] [address], [place], [address] Phone: [phone]   Fax:  [phone]  Occupational Therapy Treatment  Patient Details  Name: Victoria Pierce MRN: [id number] Date of Birth: [birth year] Referring Provider (OT): Poggi   Encounter Date: 08/06/2018  OT End of Session - 08/06/18 0823    Visit Number  8    Number of Visits  12    Date for OT Re-Evaluation  08/13/18    Authorization - Visit Number  8    Authorization - Number of Visits  15    OT Start Time  0803    OT Stop Time  0900    OT Time Calculation (min)  57 min    Activity Tolerance  Patient tolerated treatment well    Behavior During Therapy  [facility] for tasks assessed/performed       Past Medical History:  Diagnosis Date  . Anxiety   . Asthma   . Bipolar 1 disorder ([place])   . Depression   . Diverticulitis   . GERD (gastroesophageal reflux disease)   . Hernia, abdominal   . Ulcerative colitis   . Vaginal septum     Past Surgical History:  Procedure Laterality Date  . CLOSED REDUCTION METACARPAL WITH PERCUTANEOUS PINNING Left 03/07/2018   Procedure: CLOSED REDUCTION METACARPAL WITH PERCUTANEOUS PINNING-5th metacarpal;  Surgeon: Corky Mull, MD;  Location: [facility];  Service: Orthopedics;  Laterality: Left;  . COLONOSCOPY WITH PROPOFOL N/A 05/14/2018   Procedure: COLONOSCOPY WITH PROPOFOL;  Surgeon: Lin Landsman, MD;  Location: [facility] ENDOSCOPY;  Service: Gastroenterology;  Laterality: N/A;  . ESOPHAGOGASTRODUODENOSCOPY (EGD) WITH PROPOFOL N/A 05/14/2018   Procedure: ESOPHAGOGASTRODUODENOSCOPY (EGD) WITH PROPOFOL;  Surgeon: Lin Landsman, MD;  Location: [facility] ENDOSCOPY;  Service: Gastroenterology;  Laterality: N/A;  . HARDWARE REMOVAL Left 04/03/2018   Procedure: LEFT HAND FIFTH METACARPAL PIN REMOVAL;  Surgeon: Corky Mull, MD;  Location: [place];  Service: Orthopedics;  Laterality: Left;  . TRIGGER FINGER  RELEASE Left 06/19/2018   Procedure: EXTENSOR TENOLYSIS WITH CAPSULAR RELEASE OF LEFT LITTLE MCP JOINT;  Surgeon: Corky Mull, MD;  Location: [place];  Service: Orthopedics;  Laterality: Left;  . TUBAL LIGATION      There were no vitals filed for this visit.  Subjective Assessment - 08/06/18 0820    Subjective   Using it more - gripping more - intentionly - seen Dr Roland Rack yesterday - going back 4th June - about 4/10 pain after the HEP - otherwise about 2/10      Patient Stated Goals  To get my motion and strength back in my L hand - and bend my pinkie and ring finger     Currently in Pain?  Yes    Pain Score  2     Pain Location  Hand    Pain Orientation  Left    Pain Descriptors / Indicators  Aching    Pain Type  Surgical pain    Pain Onset  1 to 4 weeks ago    Pain Frequency  Intermittent         [facility] OT Assessment - 08/06/18 0001      Left Hand AROM   L Ring  MCP 0-90  90 Degrees    L Ring PIP 0-100  95 Degrees   -30 coming in ; -20 in session    L Little  MCP 0-90  80 Degrees    PROM  85; in session 90    L Little PIP 0-100  85 Degrees   -40 coming in; -30 in session              OT Treatments/Exercises (OP) - 08/06/18 0001      LUE Paraffin   Number Minutes Paraffin  8 Minutes    LUE Paraffin Location  Hand    Comments  LMB splint on 4th and 5th PIP for extention        Scar massage done using vibration over dorsal 5th MC - and cont  cica scar pad and silicon digi sleeve for night time use   done graston tool nr 2 on volar 4th and 5th prior to PROM  Done some joint mobs and gentle traction on PIP and DIP of 4th and 5th  Teal putty rolling for digits extention - 20 reps  Add to HEP - 2-3 session of heat , LMB or joint jack to 4th and 5th PIP extention , rolling of putty and stretch for extention behind PIP joint   PROM and prolonged flexion stretch done to  [place] PIP exention   PLace and hold done Composite fist  Add teal putty for  gripping - into cylinder roll for end range grip strength-  Should be pain free   Cont at home but decrease to 3 session for flexion  coban wrap her 5th incompositeflexion while doing heat  5-10 min  PROM for MC flexion , composite flexion  Knuckle bender 5 x day Then place and hold -for composite fist -was able to do AROM at 80-82 this date in session and PROM 90      modify this date night dorsal hand base splint toincrease MC flexion for night time MC Flexion at 85 degrees flexion - moldskin done inside of hand for padding over PIP's and MC's- strap for PIP extention           OT Education - 08/06/18 0822    Education Details  Changes to HEP and add 2-3 session for PIP extention     Person(s) Educated  Patient    Methods  Demonstration;Verbal cues;Handout    Comprehension  Returned demonstration       OT Short Term Goals - 07/12/18 0951      OT SHORT TERM GOAL #1   Title  Pt to be ind in HEP to decrease edema, pain and increase AROM in 4th ad 5th digit     Baseline  Extention of PIP's 4th -25, 5th -30 ;  flexion 5th MC 30 , PIP 70 ; 4th MC 55 and PIP 80 ; NOW -30 4th , -45 PIP 5th ; MC flexion 4th 80 , 5th 60     Time  2    Period  Weeks    Status  On-going    Target Date  07/23/18      OT SHORT TERM GOAL #2   Title  AROM in L 4thand 5th digit improve for pt to grip 2 cm cylinder - brush, knife     Baseline  in session able to touch or reach to 2 cm object- but not upon arrival -cannot grip     Time  2    Period  Weeks    Status  On-going    Target Date  07/23/18        OT Long Term Goals - 07/12/18 0954      OT LONG TERM GOAL #1   Title  Pain on PRWHE improve with more than 12 points     Baseline  pain at rest 5-6/10 still but pain only increase now with PROM to 10/10 - PRWHE  for pain 46/50     Time  4    Period  Weeks    Status  On-going    Target Date  08/13/18      OT LONG TERM GOAL #2   Title  AROM in 4thand 5th improve for  pt to touch palm to hold small object in palm     Baseline  see baseline at goal 1 STG- and see progress at STG 1    Time  4    Period  Weeks    Status  On-going    Target Date  08/13/18      OT LONG TERM GOAL #3   Title  Grip strength in L improve to more than 50% to carry more than 5 lbs, cut with knife ,and squeeze washcloth     Baseline   NT - pt only 3 1/2 wks s/p    Time  4    Period  Weeks    Status  New    Target Date  08/13/18            Plan - 08/06/18 0824    Clinical Impression Statement  Pt is about 7 1/2 wks s/p extensor tenolysis and capsular release - mkaing progress in flexion of 5th and 4th MC and PIP - did follow up with surgeon yesterday - recommend adding more PIP extention now - pt had 80 flexion of 5th MC this date - and PROM in session to 90 on 4thand 5th - PIP extention about same as last week     OT Occupational Profile and History  Problem Focused Assessment - Including review of records relating to presenting problem    Occupational performance deficits (Please refer to evaluation for details):  ADL's;IADL's;Leisure;Play    Body Structure / Function / Physical Skills  ADL;Dexterity;Flexibility;ROM;Strength;Scar mobility;FMC;Edema;Coordination;Pain;UE functional use    Rehab Potential  Good    Clinical Decision Making  Several treatment options, min-mod task modification necessary    Comorbidities Affecting Occupational Performance:  May have comorbidities impacting occupational performance    OT Frequency  2x / week    OT Duration  4 weeks    OT Treatment/Interventions  Self-care/ADL training;Cryotherapy;Paraffin;Therapeutic exercise;Splinting;Scar mobilization;Manual Therapy;Fluidtherapy;Contrast Bath;Passive range of motion;Patient/family education    Plan  assess progress in flexion and  extention of  MC/PIP more -    OT Home Exercise Plan  see pt instruction     Consulted and Agree with Plan of Care  Patient       Patient will benefit from  skilled therapeutic intervention in order to improve the following deficits and impairments:  Body Structure / Function / Physical Skills  Visit Diagnosis: Stiffness of left hand, not elsewhere classified  Pain in left hand  Localized edema  Muscle weakness (generalized)    Problem List Patient Active Problem List   Diagnosis Date Noted  . Chronic pelvic pain in female 06/14/2018  . Dysmenorrhea 06/14/2018  . Menorrhagia with regular cycle 06/14/2018  . Stiffness of finger joint, 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  . Closed displaced fracture of neck of fifth metacarpal bone of left hand 03/07/2018  . Ulcerative colitis   . GERD (gastroesophageal reflux disease)   . Anxiety     Rosalyn Gess OTR/L,CLT 08/06/2018, 10:56 AM  [place] [facility] [address], [place], [address] Phone: [phone]   Fax:  [phone]  Name: Victoria Pierce MRN: [id number] Date of Birth: 04-28-1980

## 2018-08-06 NOTE — Patient Instructions (Signed)
Decrease flexion sessions to 3 x day  Add 2-3 session for extention   heat - with joint jack to PIP extention on 5th and LMB splint on 4th for PIP extention 15-20 min  PROM behind PIP 3 x 87mn  Rolling of putty for extention  20 reps  AROM for extention and lumbrical fist - PIP extention with MC flexion 10 reps

## 2018-08-08 ENCOUNTER — Ambulatory Visit: Payer: Medicaid Other | Admitting: Occupational Therapy

## 2018-08-08 ENCOUNTER — Other Ambulatory Visit: Payer: Self-pay

## 2018-08-08 DIAGNOSIS — M79642 Pain in left hand: Secondary | ICD-10-CM

## 2018-08-08 DIAGNOSIS — M6281 Muscle weakness (generalized): Secondary | ICD-10-CM

## 2018-08-08 DIAGNOSIS — M25642 Stiffness of left hand, not elsewhere classified: Secondary | ICD-10-CM | POA: Diagnosis not present

## 2018-08-08 DIAGNOSIS — R600 Localized edema: Secondary | ICD-10-CM

## 2018-08-08 NOTE — Therapy (Signed)
[place] [facility] [address], [place], [address] Phone: [phone]   Fax:  [phone]  Occupational Therapy Treatment  Patient Details  Name: Victoria Pierce MRN: [id number] Date of Birth: 1981/02/23 Referring Provider (OT): Poggi   Encounter Date: 08/08/2018  OT End of Session - 08/08/18 0920    Visit Number  9    Number of Visits  12    Date for OT Re-Evaluation  08/13/18    Authorization - Visit Number  9    Authorization - Number of Visits  15    OT Start Time  0903    OT Stop Time  0950    OT Time Calculation (min)  47 min    Activity Tolerance  Patient tolerated treatment well    Behavior During Therapy  [facility] for tasks assessed/performed       Past Medical History:  Diagnosis Date  . Anxiety   . Asthma   . Bipolar 1 disorder ([place])   . Depression   . Diverticulitis   . GERD (gastroesophageal reflux disease)   . Hernia, abdominal   . Ulcerative colitis   . Vaginal septum     Past Surgical History:  Procedure Laterality Date  . CLOSED REDUCTION METACARPAL WITH PERCUTANEOUS PINNING Left 03/07/2018   Procedure: CLOSED REDUCTION METACARPAL WITH PERCUTANEOUS PINNING-5th metacarpal;  Surgeon: Corky Mull, MD;  Location: [facility];  Service: Orthopedics;  Laterality: Left;  . COLONOSCOPY WITH PROPOFOL N/A 05/14/2018   Procedure: COLONOSCOPY WITH PROPOFOL;  Surgeon: Lin Landsman, MD;  Location: [facility] ENDOSCOPY;  Service: Gastroenterology;  Laterality: N/A;  . ESOPHAGOGASTRODUODENOSCOPY (EGD) WITH PROPOFOL N/A 05/14/2018   Procedure: ESOPHAGOGASTRODUODENOSCOPY (EGD) WITH PROPOFOL;  Surgeon: Lin Landsman, MD;  Location: [facility] ENDOSCOPY;  Service: Gastroenterology;  Laterality: N/A;  . HARDWARE REMOVAL Left 04/03/2018   Procedure: LEFT HAND FIFTH METACARPAL PIN REMOVAL;  Surgeon: Corky Mull, MD;  Location: [place];  Service: Orthopedics;  Laterality: Left;  . TRIGGER FINGER  RELEASE Left 06/19/2018   Procedure: EXTENSOR TENOLYSIS WITH CAPSULAR RELEASE OF LEFT LITTLE MCP JOINT;  Surgeon: Corky Mull, MD;  Location: [place];  Service: Orthopedics;  Laterality: Left;  . TUBAL LIGATION      There were no vitals filed for this visit.  Subjective Assessment - 08/08/18 0917    Subjective   I was able to snap my finges - that was first time - the spring splint work well on my ring finger - and then the joint jack on my pinkie- able to type better on  keyboard    Patient Stated Goals  To get my motion and strength back in my L hand - and bend my pinkie and ring finger     Pain Score  4     Pain Location  Finger (Comment which one)    Pain Orientation  Left    Pain Descriptors / Indicators  Aching    Pain Type  Surgical pain    Pain Onset  1 to 4 weeks ago    Pain Frequency  Occasional    Aggravating Factors   wiht exericses - 4/10 - rest 2/10          [facility] OT Assessment - 08/08/18 0001      Strength   Right Hand Grip (lbs)  83    Right Hand Lateral Pinch  17 lbs    Right Hand 3 Point Pinch  22 lbs  Left Hand Grip (lbs)  55    Left Hand Lateral Pinch  15 lbs    Left Hand 3 Point Pinch  18 lbs      Left Hand AROM   L Ring  MCP 0-90  90 Degrees    L Ring PIP 0-100  95 Degrees   -25 walking in; -20 in session   L Little  MCP 0-90  80 Degrees   PROM 90   L Little PIP 0-100  85 Degrees   -35 coming in ,  in session -25               OT Treatments/Exercises (OP) - 08/08/18 0001      LUE Paraffin   Number Minutes Paraffin  8 Minutes    LUE Paraffin Location  Hand    Comments  flexion wrap coban on 5th to 85 MC flexion      Scar massage done using vibration over dorsal 5th MC - andcontcica scar pad and silicon digi sleeve for night time use    PROM and prolonged flexion stretch done to  [place] PIP exention   PLace and hold done Composite fist  Add teal putty for gripping - into cylinder roll for end range grip  strength- and into small ball gripping with 5th into it   pain free   done graston tool nr 2 on volar 4th and 5th prior to PROM for PIP extentoin Done some joint mobs and gentle traction on PIP and DIP of 4th and 5th - pain increase 2-3/10 Teal putty rolling for digits extention - 20 reps  Pt wear joint jack on 5th and LMB on 4th for 5 min   with increase extention  Cont HEP - 2-3 session of heat , LMB or joint jack to 4th and 5th PIP extention , rolling of putty and stretch for extention behind PIP joint    Cont at home but decrease to 3 session for flexion  coban wrap her 5th incompositeflexion while doing heat  5-10 min  PROM for MC flexion , composite flexion  Knuckle bender 5 x day Then place and hold -for composite fist -was able to do AROM at 80-82 this date in session and PROM 90      modify this date night dorsal hand base splint toincrease MC flexion for night time MC Flexion at 85 degrees flexion - moldskin done inside of hand for padding over PIP's and MC's- strap for PIP extention          OT Education - 08/08/18 0920    Education Details  HEP for exention and flexion     Person(s) Educated  Patient    Methods  Demonstration;Verbal cues;Handout    Comprehension  Returned demonstration       OT Short Term Goals - 07/12/18 0951      OT SHORT TERM GOAL #1   Title  Pt to be ind in HEP to decrease edema, pain and increase AROM in 4th ad 5th digit     Baseline  Extention of PIP's 4th -25, 5th -30 ;  flexion 5th MC 30 , PIP 70 ; 4th MC 55 and PIP 80 ; NOW -30 4th , -45 PIP 5th ; MC flexion 4th 80 , 5th 60     Time  2    Period  Weeks    Status  On-going    Target Date  07/23/18      OT SHORT TERM GOAL #2  Title  AROM in L 4thand 5th digit improve for pt to grip 2 cm cylinder - brush, knife     Baseline  in session able to touch or reach to 2 cm object- but not upon arrival -cannot grip     Time  2    Period  Weeks    Status  On-going     Target Date  07/23/18        OT Long Term Goals - 07/12/18 0954      OT LONG TERM GOAL #1   Title  Pain on PRWHE improve with more than 12 points     Baseline  pain at rest 5-6/10 still but pain only increase now with PROM to 10/10 - PRWHE  for pain 46/50     Time  4    Period  Weeks    Status  On-going    Target Date  08/13/18      OT LONG TERM GOAL #2   Title  AROM in 4thand 5th improve for pt to touch palm to hold small object in palm     Baseline  see baseline at goal 1 STG- and see progress at STG 1    Time  4    Period  Weeks    Status  On-going    Target Date  08/13/18      OT LONG TERM GOAL #3   Title  Grip strength in L improve to more than 50% to carry more than 5 lbs, cut with knife ,and squeeze washcloth     Baseline   NT - pt only 3 1/2 wks s/p    Time  4    Period  Weeks    Status  New    Target Date  08/13/18            Plan - 08/08/18 0920    Clinical Impression Statement  Pt is about 8 wks s/ p extensor tenolysis and capsular release - pt making progress in extetion of PIP of 4thand 5th - and walk in again with 80 degrees 5th MC felxion - and in session was place and hold 90 - pt to cont to work on extention of PIP 's and Flexion of 5th MC     OT Occupational Profile and History  Problem Focused Assessment - Including review of records relating to presenting problem    Occupational performance deficits (Please refer to evaluation for details):  ADL's;IADL's;Leisure;Play    Body Structure / Function / Physical Skills  ADL;Dexterity;Flexibility;ROM;Strength;Scar mobility;FMC;Edema;Coordination;Pain;UE functional use    Rehab Potential  Good    Clinical Decision Making  Several treatment options, min-mod task modification necessary    Comorbidities Affecting Occupational Performance:  May have comorbidities impacting occupational performance    OT Frequency  2x / week    OT Duration  4 weeks    OT Treatment/Interventions  Self-care/ADL  training;Cryotherapy;Paraffin;Therapeutic exercise;Splinting;Scar mobilization;Manual Therapy;Fluidtherapy;Contrast Bath;Passive range of motion;Patient/family education    Plan  assess progress in flexion and  extention of  MC/PIP more -    OT Home Exercise Plan  see pt instruction     Consulted and Agree with Plan of Care  Patient       Patient will benefit from skilled therapeutic intervention in order to improve the following deficits and impairments:  Body Structure / Function / Physical Skills  Visit Diagnosis: Stiffness of left hand, not elsewhere classified  Pain in left hand  Localized edema  Muscle weakness (generalized)  Problem List Patient Active Problem List   Diagnosis Date Noted  . Chronic pelvic pain in female 06/14/2018  . Dysmenorrhea 06/14/2018  . Menorrhagia with regular cycle 06/14/2018  . Stiffness of finger joint, 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  . Closed displaced fracture of neck of fifth metacarpal bone of left hand 03/07/2018  . Ulcerative colitis   . GERD (gastroesophageal reflux disease)   . Anxiety     Rosalyn Gess OTR/L,CLT 08/08/2018, 5:22 PM  [facility] [address] [place], [address] Phone: [phone]   Fax:  [phone]  Name: Victoria Pierce MRN: [id number] Date of Birth: Sep 19, 1980

## 2018-08-08 NOTE — Patient Instructions (Signed)
Same as last time

## 2018-08-13 ENCOUNTER — Other Ambulatory Visit: Payer: Self-pay

## 2018-08-13 ENCOUNTER — Ambulatory Visit: Payer: Medicaid Other | Admitting: Occupational Therapy

## 2018-08-13 DIAGNOSIS — M25642 Stiffness of left hand, not elsewhere classified: Secondary | ICD-10-CM

## 2018-08-13 DIAGNOSIS — R600 Localized edema: Secondary | ICD-10-CM

## 2018-08-13 DIAGNOSIS — M79642 Pain in left hand: Secondary | ICD-10-CM

## 2018-08-13 DIAGNOSIS — M6281 Muscle weakness (generalized): Secondary | ICD-10-CM

## 2018-08-13 NOTE — Therapy (Signed)
[place] [facility] [address], [place], [address] Phone: [phone]   Fax:  [phone]  Occupational Therapy Treatment  Patient Details  Name: Victoria Pierce MRN: [id number] Date of Birth: 31-Jan-1981 Referring Provider (OT): Poggi   Encounter Date: 08/13/2018  OT End of Session - 08/13/18 1018    Visit Number  10    Number of Visits  12    Date for OT Re-Evaluation  08/13/18    Authorization - Visit Number  10    Authorization - Number of Visits  15    OT Start Time  0902    OT Stop Time  0950    OT Time Calculation (min)  48 min    Activity Tolerance  Patient tolerated treatment well    Behavior During Therapy  [facility] for tasks assessed/performed       Past Medical History:  Diagnosis Date  . Anxiety   . Asthma   . Bipolar 1 disorder ([place])   . Depression   . Diverticulitis   . GERD (gastroesophageal reflux disease)   . Hernia, abdominal   . Ulcerative colitis   . Vaginal septum     Past Surgical History:  Procedure Laterality Date  . CLOSED REDUCTION METACARPAL WITH PERCUTANEOUS PINNING Left 03/07/2018   Procedure: CLOSED REDUCTION METACARPAL WITH PERCUTANEOUS PINNING-5th metacarpal;  Surgeon: Corky Mull, MD;  Location: [facility] ORS;  Service: Orthopedics;  Laterality: Left;  . COLONOSCOPY WITH PROPOFOL N/A 05/14/2018   Procedure: COLONOSCOPY WITH PROPOFOL;  Surgeon: Lin Landsman, MD;  Location: [facility];  Service: Gastroenterology;  Laterality: N/A;  . ESOPHAGOGASTRODUODENOSCOPY (EGD) WITH PROPOFOL N/A 05/14/2018   Procedure: ESOPHAGOGASTRODUODENOSCOPY (EGD) WITH PROPOFOL;  Surgeon: Lin Landsman, MD;  Location: [facility] ENDOSCOPY;  Service: Gastroenterology;  Laterality: N/A;  . HARDWARE REMOVAL Left 04/03/2018   Procedure: LEFT HAND FIFTH METACARPAL PIN REMOVAL;  Surgeon: Corky Mull, MD;  Location: [place];  Service: Orthopedics;  Laterality: Left;  . TRIGGER FINGER  RELEASE Left 06/19/2018   Procedure: EXTENSOR TENOLYSIS WITH CAPSULAR RELEASE OF LEFT LITTLE MCP JOINT;  Surgeon: Corky Mull, MD;  Location: [place];  Service: Orthopedics;  Laterality: Left;  . TUBAL LIGATION      There were no vitals filed for this visit.  Subjective Assessment - 08/13/18 1015    Subjective   Doing okay - bending better - not touching palm yet -but close at home - and ring finger straigthening getting better     Patient Stated Goals  To get my motion and strength back in my L hand - and bend my pinkie and ring finger     Currently in Pain?  Yes    Pain Score  3     Pain Location  Hand    Pain Orientation  Left    Pain Descriptors / Indicators  Aching    Pain Type  Surgical pain    Pain Onset  More than a month ago    Pain Frequency  Occasional         [facility] OT Assessment - 08/13/18 0001      Left Hand AROM   L Ring  MCP 0-90  90 Degrees    L Ring PIP 0-100  95 Degrees   -30 coming in / -20 in session    L Little  MCP 0-90  85 Degrees   90 PROM    L Little PIP 0-100  90  Degrees   -35 coming in / session -25      Extention same as before coming in - unable to maintain progress made is session or home  Making great progress in flexion at PIP and MC of 5th and 4th          OT Treatments/Exercises (OP) - 08/13/18 0001      LUE Paraffin   Number Minutes Paraffin  8 Minutes    LUE Paraffin Location  Hand    Comments  LMB splints on 4thand 5th for PIP extention at [facility]        Scar massage done using vibration over dorsal 5th MC - and xtractor  andcontcica scar pad and silicon digi sleeve for night time use- new one issued this date     PROM and prolonged extention stretch to 4th and 5th DIP and PIP   PLace and hold tapping of table  And ABD and ADD of digits - needed AAROM    done graston tool nr 2 on volar 4th and 5th prior to PROM for PIP extentoin Done some joint mobs and gentle traction on PIP and DIP of 4th and 5th  - pain increase 2-3/10 Teal putty rolling for digits extention - 20 reps  Pt wear joint jack on 5th and LMB on 4th for 5 min   with increase extention  Cont HEP - 2-3 session of heat , LMB or joint jack to 4th and 5th PIP extention , rolling of putty and stretch for extention behind PIP joint   Cont at home but decrease to 3 session for flexioncoban wrap her 5th incompositeflexion while doing heat  5-10 min  PROM for MC flexion , composite flexion  Knuckle bender 5 x day Then place and hold -for composite fist -was able to do AROM 85 this date walking in and PROM 90        Fabricated   quick cast for extention to 4th PIP - to wear incombination with  night splint if can -and during day 20-30 min - and during knuckle bender use  extention PIP gutter splint on 5th       OT Education - 08/13/18 1017    Education Details  HEP for exention and flexion     Person(s) Educated  Patient    Methods  Demonstration;Verbal cues;Handout    Comprehension  Returned demonstration       OT Short Term Goals - 07/12/18 0951      OT SHORT TERM GOAL #1   Title  Pt to be ind in HEP to decrease edema, pain and increase AROM in 4th ad 5th digit     Baseline  Extention of PIP's 4th -25, 5th -30 ;  flexion 5th MC 30 , PIP 70 ; 4th MC 55 and PIP 80 ; NOW -30 4th , -45 PIP 5th ; MC flexion 4th 80 , 5th 60     Time  2    Period  Weeks    Status  On-going    Target Date  07/23/18      OT SHORT TERM GOAL #2   Title  AROM in L 4thand 5th digit improve for pt to grip 2 cm cylinder - brush, knife     Baseline  in session able to touch or reach to 2 cm object- but not upon arrival -cannot grip     Time  2    Period  Weeks    Status  On-going  Target Date  07/23/18        OT Long Term Goals - 07/12/18 0954      OT LONG TERM GOAL #1   Title  Pain on PRWHE improve with more than 12 points     Baseline  pain at rest 5-6/10 still but pain only increase now with PROM to 10/10 - PRWHE  for  pain 46/50     Time  4    Period  Weeks    Status  On-going    Target Date  08/13/18      OT LONG TERM GOAL #2   Title  AROM in 4thand 5th improve for pt to touch palm to hold small object in palm     Baseline  see baseline at goal 1 STG- and see progress at STG 1    Time  4    Period  Weeks    Status  On-going    Target Date  08/13/18      OT LONG TERM GOAL #3   Title  Grip strength in L improve to more than 50% to carry more than 5 lbs, cut with knife ,and squeeze washcloth     Baseline   NT - pt only 3 1/2 wks s/p    Time  4    Period  Weeks    Status  New    Target Date  08/13/18            Plan - 08/13/18 1036    Clinical Impression Statement  Pt is about 9 wks s/p extensor tenolysis and capsular release of 5th MC - making progress in flexion at 4thand 5th - but unable to maintain progress in PIP at [place] 5th made insession -fabricated this date quick extention cast for 4th PIP , and gutter splnt for 5th - to wear 20-30 min at time during day -and if she can in combination with hand base MC flexion splint night time     OT Occupational Profile and History  Problem Focused Assessment - Including review of records relating to presenting problem    Occupational performance deficits (Please refer to evaluation for details):  ADL's;IADL's;Leisure;Play    Body Structure / Function / Physical Skills  ADL;Dexterity;Flexibility;ROM;Strength;Scar mobility;FMC;Edema;Coordination;Pain;UE functional use    Rehab Potential  Good    Clinical Decision Making  Several treatment options, min-mod task modification necessary    Comorbidities Affecting Occupational Performance:  May have comorbidities impacting occupational performance    OT Frequency  2x / week    OT Duration  4 weeks    OT Treatment/Interventions  Self-care/ADL training;Cryotherapy;Paraffin;Therapeutic exercise;Splinting;Scar mobilization;Manual Therapy;Fluidtherapy;Contrast Bath;Passive range of motion;Patient/family  education    Plan  assess progress in flexion and  extention of  MC/PIP more -    OT Home Exercise Plan  see pt instruction     Consulted and Agree with Plan of Care  Patient       Patient will benefit from skilled therapeutic intervention in order to improve the following deficits and impairments:  Body Structure / Function / Physical Skills  Visit Diagnosis: Stiffness of left hand, not elsewhere classified  Pain in left hand  Localized edema  Muscle weakness (generalized)    Problem List Patient Active Problem List   Diagnosis Date Noted  . Chronic pelvic pain in female 06/14/2018  . Dysmenorrhea 06/14/2018  . Menorrhagia with regular cycle 06/14/2018  . Stiffness of finger joint, 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  . Closed displaced fracture of neck of fifth  metacarpal bone of left hand 03/07/2018  . Ulcerative colitis   . GERD (gastroesophageal reflux disease)   . Anxiety     Rosalyn Gess OTR/L,CLT 08/13/2018, 10:39 AM  [place] [facility] [address] [place], [address] Phone: [phone]   Fax:  [phone]  Name: Victoria Pierce MRN: [id number] Date of Birth: 09-12-1980

## 2018-08-13 NOTE — Patient Instructions (Signed)
Add quick cast for extention to 4th PIP - to wear in night splint if can -and during day 20-30 min - and during knuckle bender use  extention PIP gutter splint on 5th

## 2018-08-15 ENCOUNTER — Other Ambulatory Visit: Payer: Self-pay

## 2018-08-15 ENCOUNTER — Ambulatory Visit: Payer: Medicaid Other | Admitting: Occupational Therapy

## 2018-08-15 DIAGNOSIS — M6281 Muscle weakness (generalized): Secondary | ICD-10-CM

## 2018-08-15 DIAGNOSIS — M25642 Stiffness of left hand, not elsewhere classified: Secondary | ICD-10-CM | POA: Diagnosis not present

## 2018-08-15 DIAGNOSIS — M79642 Pain in left hand: Secondary | ICD-10-CM

## 2018-08-15 DIAGNOSIS — R600 Localized edema: Secondary | ICD-10-CM

## 2018-08-15 NOTE — Patient Instructions (Signed)
Can do PROM for composite flexion for 5th - rolling knuckle over hand in putty  10 reps   Reed how to fasten Joint jack correctly to apply extention stretch to 4th and 5th PIP

## 2018-08-15 NOTE — Therapy (Signed)
[place] [facility] [address], [place], [address] Phone: [phone]   Fax:  [phone]  Occupational Therapy Treatment  Patient Details  Name: Victoria Pierce MRN: [id number] Date of Birth: July 23, 1980 Referring Provider (OT): Poggi   Encounter Date: 08/15/2018  OT End of Session - 08/15/18 0813    Visit Number  11    Number of Visits  16    Date for OT Re-Evaluation  09/12/18    Authorization - Visit Number  11    Authorization - Number of Visits  15    OT Start Time  0803    OT Stop Time  0852    OT Time Calculation (min)  49 min    Activity Tolerance  Patient tolerated treatment well    Behavior During Therapy  [facility] for tasks assessed/performed       Past Medical History:  Diagnosis Date  . Anxiety   . Asthma   . Bipolar 1 disorder ([place])   . Depression   . Diverticulitis   . GERD (gastroesophageal reflux disease)   . Hernia, abdominal   . Ulcerative colitis   . Vaginal septum     Past Surgical History:  Procedure Laterality Date  . CLOSED REDUCTION METACARPAL WITH PERCUTANEOUS PINNING Left 03/07/2018   Procedure: CLOSED REDUCTION METACARPAL WITH PERCUTANEOUS PINNING-5th metacarpal;  Surgeon: Corky Mull, MD;  Location: [facility] ORS;  Service: Orthopedics;  Laterality: Left;  . COLONOSCOPY WITH PROPOFOL N/A 05/14/2018   Procedure: COLONOSCOPY WITH PROPOFOL;  Surgeon: Lin Landsman, MD;  Location: [facility] ENDOSCOPY;  Service: Gastroenterology;  Laterality: N/A;  . ESOPHAGOGASTRODUODENOSCOPY (EGD) WITH PROPOFOL N/A 05/14/2018   Procedure: ESOPHAGOGASTRODUODENOSCOPY (EGD) WITH PROPOFOL;  Surgeon: Lin Landsman, MD;  Location: [facility] END[facility]COPY;  Service: Gastroenterology;  Laterality: N/A;  . HARDWARE REMOVAL Left 04/03/2018   Procedure: LEFT HAND FIFTH METACARPAL PIN REMOVAL;  Surgeon: Corky Mull, MD;  Location: [place];  Service: Orthopedics;  Laterality: Left;  . TRIGGER FINGER  RELEASE Left 06/19/2018   Procedure: EXTENSOR TENOLYSIS WITH CAPSULAR RELEASE OF LEFT LITTLE MCP JOINT;  Surgeon: Corky Mull, MD;  Location: [place];  Service: Orthopedics;  Laterality: Left;  . TUBAL LIGATION      There were no vitals filed for this visit.  Subjective Assessment - 08/15/18 0810    Subjective   Increase stiffness today because of the rain - otherwise using it and pain about 2/10 but today 4/10 - starting to use my pinkie on the R on keyboard for A and caplock     Patient Stated Goals  To get my motion and strength back in my L hand - and bend my pinkie and ring finger     Currently in Pain?  Yes    Pain Score  4     Pain Location  Hand    Pain Orientation  Left    Pain Descriptors / Indicators  Aching;Tightness    Pain Type  Surgical pain    Pain Onset  More than a month ago         [facility] OT Assessment - 08/15/18 0001      Left Hand AROM   L Ring PIP 0-100  --   -32 and PROM in session -10 and AROM -25   L Little  MCP 0-90  70 Degrees   in session AROM 85 , PROM 90   L Little PIP 0-100  95 Degrees   -  35 and -30 in session-PROM -10              OT Treatments/Exercises (OP) - 08/15/18 0001      LUE Paraffin   Number Minutes Paraffin  8 Minutes    LUE Paraffin Location  Hand    Comments  Flexion stretch with coban 5th        Scar massage done using vibration over dorsal 5th MC - andcontcica scar pad and silicon digi sleeve for night time use    PROM and prolonged flexion stretch done toMCWith PIP exention  PLace and hold done Composite fist  Add  Rolling composite flexion to 5th over teal putty  10 reps  - 85 degrees of 5th MC flexion , 90 PROM  Review teal putty for gripping - into cylinder roll for end range grip strength- much better and deeper into putty and into small ball gripping with 5th into it   pain free  done graston tool nr 2 on volar 4th and 5th prior to PROM for PIP extentoin Done some joint mobs  and gentle traction on PIP and DIP of 4th and 5th - pain increase 2-3/10 Teal putty rolling for digits extention - 20 reps  Pt reed wearing and donning of  joint jack on 5th and  4th PIP - pt could not get enough pressure - can also use LMB splint for PIP     Cont HEP - 2-3 session of heat , LMB or joint jack to 4th and 5th PIP extention , rolling of putty and stretch for extention behind PIP joint   Cont at home but decrease to 3 session for flexioncoban wrap her 5th incompositeflexion while doing heat  5-10 min  PROM for MC flexion , composite flexion  Knuckle bender 5 x day Then place and hold -for composite fist -was able to do AROM at 80-82 this date in session and PROM 90           OT Education - 08/15/18 0813    Education Details  joint jack application for 4thand 5th PIP ext , PROM composite flexion 5th     Person(s) Educated  Patient    Methods  Demonstration;Verbal cues;Handout    Comprehension  Returned demonstration       OT Short Term Goals - 08/15/18 0901      OT SHORT TERM GOAL #1   Title  Pt to be ind in HEP to decrease edema, pain and increase AROM in 4th ad 5th digit     Status  Achieved      OT SHORT TERM GOAL #2   Title  AROM in L 4thand 5th digit improve for pt to grip 2 cm cylinder - brush, knife     Status  Achieved        OT Long Term Goals - 08/15/18 0902      OT LONG TERM GOAL #1   Title  Pain on PRWHE improve with more than 12 points     Baseline   PRWHE  for pain 46/50  and now 27/50 ( 08/15/18)    Status  Achieved      OT LONG TERM GOAL #2   Title  AROM in 4thand 5th improve for pt to touch palm to hold small object in palm     Baseline  SOC of session 80-85 degrees - except if rainy weather 70 degrees - show increase strenght into putty- small object still fall out  Time  4    Period  Weeks    Status  On-going    Target Date  09/12/18      OT LONG TERM GOAL #3   Title  Grip strength in L improve to more than 50% to  carry more than 5 lbs, cut with knife ,and squeeze washcloth     Baseline  83 lbs R , 55 on L - can carry 8 lbs without issues, and cut wit knife -but not yet tight on squeezing washcloth     Time  4    Period  Weeks    Status  On-going    Target Date  09/12/18            Plan - 08/15/18 0815    Clinical Impression Statement  Pt is 8 wks and one day s/p extensor tenolysis and capsular release of 5th MC - showing improvement in flexion of 5th and increase strength into teal putty - extention of PIP stil impaired -and pt was this ate more stiff coming in because of rain and pain was increase -but did decrease in session and showed increase ROM - reed on use of joint jack correctly     OT Occupational Profile and History  Problem Focused Assessment - Including review of records relating to presenting problem    Occupational performance deficits (Please refer to evaluation for details):  ADL's;IADL's;Leisure;Play    Body Structure / Function / Physical Skills  ADL;Dexterity;Flexibility;ROM;Strength;Scar mobility;FMC;Edema;Coordination;Pain;UE functional use    Rehab Potential  Good    Clinical Decision Making  Several treatment options, min-mod task modification necessary    Comorbidities Affecting Occupational Performance:  May have comorbidities impacting occupational performance    OT Frequency  2x / week    OT Duration  4 weeks    OT Treatment/Interventions  Self-care/ADL training;Cryotherapy;Paraffin;Therapeutic exercise;Splinting;Scar mobilization;Manual Therapy;Fluidtherapy;Contrast Bath;Passive range of motion;Patient/family education    Plan  assess progress in flexion and  extention of  MC/PIP more -    OT Home Exercise Plan  see pt instruction     Consulted and Agree with Plan of Care  Patient       Patient will benefit from skilled therapeutic intervention in order to improve the following deficits and impairments:  Body Structure / Function / Physical Skills  Visit  Diagnosis: Stiffness of left hand, not elsewhere classified  Pain in left hand  Localized edema  Muscle weakness (generalized)    Problem List Patient Active Problem List   Diagnosis Date Noted  . Chronic pelvic pain in female 06/14/2018  . Dysmenorrhea 06/14/2018  . Menorrhagia with regular cycle 06/14/2018  . Stiffness of finger joint, 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  . Closed displaced fracture of neck of fifth metacarpal bone of left hand 03/07/2018  . Ulcerative colitis   . GERD (gastroesophageal reflux disease)   . Anxiety     Rosalyn Gess OTR/l,CLT 08/15/2018, 9:59 AM  [place] [facility] [address] [place], [address] Phone: [phone]   Fax:  [phone]  Name: ALAYIAH FONTES MRN: [id number] Date of Birth: 03-06-1981

## 2018-08-20 ENCOUNTER — Other Ambulatory Visit: Payer: Self-pay

## 2018-08-20 ENCOUNTER — Ambulatory Visit: Payer: Medicaid Other | Attending: Surgery | Admitting: Occupational Therapy

## 2018-08-20 DIAGNOSIS — M25642 Stiffness of left hand, not elsewhere classified: Secondary | ICD-10-CM | POA: Diagnosis not present

## 2018-08-20 DIAGNOSIS — R600 Localized edema: Secondary | ICD-10-CM

## 2018-08-20 DIAGNOSIS — M79642 Pain in left hand: Secondary | ICD-10-CM

## 2018-08-20 DIAGNOSIS — M6281 Muscle weakness (generalized): Secondary | ICD-10-CM

## 2018-08-20 NOTE — Patient Instructions (Signed)
Same HEP but add night time quick cast to wear for PIP extention on 4th and 5th  And during day after LMB or joint jack while using knuckle bender

## 2018-08-20 NOTE — Therapy (Signed)
[facility] [address] [place], [address] Phone: [phone]   Fax:  [phone]  Occupational Therapy Treatment  Patient Details  Name: Victoria Pierce MRN: [id number] Date of Birth: [birth year] Referring Provider (OT): Poggi   Encounter Date: 08/20/2018  OT End of Session - 08/20/18 1018    Visit Number  12    Number of Visits  16    Date for OT Re-Evaluation  09/12/18    Authorization - Visit Number  12    Authorization - Number of Visits  15    OT Start Time  1005    OT Stop Time  1045    OT Time Calculation (min)  40 min    Activity Tolerance  Patient tolerated treatment well    Behavior During Therapy  [facility] for tasks assessed/performed       Past Medical History:  Diagnosis Date  . Anxiety   . Asthma   . Bipolar 1 disorder ([place])   . Depression   . Diverticulitis   . GERD (gastroesophageal reflux disease)   . Hernia, abdominal   . Ulcerative colitis   . Vaginal septum     Past Surgical History:  Procedure Laterality Date  . CLOSED REDUCTION METACARPAL WITH PERCUTANEOUS PINNING Left 03/07/2018   Procedure: CLOSED REDUCTION METACARPAL WITH PERCUTANEOUS PINNING-5th metacarpal;  Surgeon: Corky Mull, MD;  Location: [facility] ORS;  Service: Orthopedics;  Laterality: Left;  . COLONOSCOPY WITH PROPOFOL N/A 05/14/2018   Procedure: COLONOSCOPY WITH PROPOFOL;  Surgeon: Lin Landsman, MD;  Location: [facility] ENDOSCOPY;  Service: Gastroenterology;  Laterality: N/A;  . ESOPHAGOGASTRODUODENOSCOPY (EGD) WITH PROPOFOL N/A 05/14/2018   Procedure: ESOPHAGOGASTRODUODENOSCOPY (EGD) WITH PROPOFOL;  Surgeon: Lin Landsman, MD;  Location: [facility] ENDOSCOPY;  Service: Gastroenterology;  Laterality: N/A;  . HARDWARE REMOVAL Left 04/03/2018   Procedure: LEFT HAND FIFTH METACARPAL PIN REMOVAL;  Surgeon: Corky Mull, MD;  Location: [place];  Service: Orthopedics;  Laterality: Left;  . TRIGGER FINGER  RELEASE Left 06/19/2018   Procedure: EXTENSOR TENOLYSIS WITH CAPSULAR RELEASE OF LEFT LITTLE MCP JOINT;  Surgeon: Corky Mull, MD;  Location: [facility];  Service: Orthopedics;  Laterality: Left;  . TUBAL LIGATION      There were no vitals filed for this visit.  Subjective Assessment - 08/20/18 1016    Subjective   I clapped my hands this weekend - did order the paraffin bath to use at home - and working on keyboard -     Patient Stated Goals  To get my motion and strength back in my L hand - and bend my pinkie and ring finger     Currently in Pain?  Yes    Pain Score  4     Pain Location  Hand    Pain Orientation  Left    Pain Descriptors / Indicators  Aching    Pain Type  Surgical pain    Pain Onset  More than a month ago    Pain Frequency  Occasional         [facility] OT Assessment - 08/20/18 0001      Left Hand AROM   L Ring  MCP 0-90  90 Degrees    L Ring PIP 0-100  95 Degrees   -28 coming in , - 25 in session  -10 PROM    L Little  MCP 0-90  80 Degrees   90   L Little PIP  0-100  95 Degrees   -36 coming in , -30 AROM , - 5 PROM               OT Treatments/Exercises (OP) - 08/20/18 0001      LUE Paraffin   Number Minutes Paraffin  8 Minutes    LUE Paraffin Location  Hand    Comments  extention LMB splnt on 4th adn 5th PIP      Scar massage done using vibration over dorsal 5th MC - andcontcica scar pad and silicon digi sleeve for night time use     done graston tool nr 2 on volar 4th and 5th prior to PROMfor PIP extentoin Done some joint mobs and gentle traction on PIP and DIP of 4th and 5th- pain increase 2-3/10 Prolonged extention stretch to PIP on table prior to fabrication  Teal putty rolling for digits extention - 20 reps Pt to cont  wearing   joint jack on 5th and  4th PIP - pt could not get enough pressure - can also use LMB splint for PIP    ContHEP - 2-3 session of heat , LMB or joint jack to 4th and 5th PIP extention ,  rolling of putty and stretch for extention behind PIP joint  Fabricated PIP extention cast to 4th and 5th to wear at night time and during day as much as she can - in combination with knuckle bender     Cont at home but decrease to 3 session for flexioncoban wrap her 5th incompositeflexion while doing heat  5-10 min  PROM for MC flexion , composite flexion  Knuckle bender 5 x day Then place and hold -for composite fist -was able to do AROM at 80-82 this date in session and PROM 90           OT Education - 08/20/18 1059    Education Details  joint jack application for 4thand 5th PIP ext , and wear PIP extention cast     Person(s) Educated  Patient    Methods  Demonstration;Verbal cues;Handout    Comprehension  Returned demonstration       OT Short Term Goals - 08/15/18 0901      OT SHORT TERM GOAL #1   Title  Pt to be ind in HEP to decrease edema, pain and increase AROM in 4th ad 5th digit     Status  Achieved      OT SHORT TERM GOAL #2   Title  AROM in L 4thand 5th digit improve for pt to grip 2 cm cylinder - brush, knife     Status  Achieved        OT Long Term Goals - 08/15/18 0902      OT LONG TERM GOAL #1   Title  Pain on PRWHE improve with more than 12 points     Baseline   PRWHE  for pain 46/50  and now 27/50 ( 08/15/18)    Status  Achieved      OT LONG TERM GOAL #2   Title  AROM in 4thand 5th improve for pt to touch palm to hold small object in palm     Baseline  SOC of session 80-85 degrees - except if rainy weather 70 degrees - show increase strenght into putty- small object still fall out     Time  4    Period  Weeks    Status  On-going    Target Date  09/12/18  OT LONG TERM GOAL #3   Title  Grip strength in L improve to more than 50% to carry more than 5 lbs, cut with knife ,and squeeze washcloth     Baseline  83 lbs R , 55 on L - can carry 8 lbs without issues, and cut wit knife -but not yet tight on squeezing washcloth     Time  4     Period  Weeks    Status  On-going    Target Date  09/12/18            Plan - 08/20/18 1018    Clinical Impression Statement  Pt is 8 1/2 wks s/p for extensor tenolysis and capsular release of 5th MC - showing improvement in [place] and PIP flexion -but PIP extention about same - did fabrcate PIP extention cast to wear night time and some during day     OT Occupational Profile and History  Problem Focused Assessment - Including review of records relating to presenting problem    Occupational performance deficits (Please refer to evaluation for details):  ADL's;IADL's;Leisure;Play    Body Structure / Function / Physical Skills  ADL;Dexterity;Flexibility;ROM;Strength;Scar mobility;FMC;Edema;Coordination;Pain;UE functional use    Rehab Potential  Good    Clinical Decision Making  Several treatment options, min-mod task modification necessary    Comorbidities Affecting Occupational Performance:  May have comorbidities impacting occupational performance    OT Frequency  2x / week    OT Duration  4 weeks    OT Treatment/Interventions  Self-care/ADL training;Cryotherapy;Paraffin;Therapeutic exercise;Splinting;Scar mobilization;Manual Therapy;Fluidtherapy;Contrast Bath;Passive range of motion;Patient/family education    Plan  assess progress in flexion and  extention of  MC/PIP more -    OT Home Exercise Plan  see pt instruction     Consulted and Agree with Plan of Care  Patient       Patient will benefit from skilled therapeutic intervention in order to improve the following deficits and impairments:  Body Structure / Function / Physical Skills  Visit Diagnosis: Stiffness of left hand, not elsewhere classified  Pain in left hand  Localized edema  Muscle weakness (generalized)    Problem List Patient Active Problem List   Diagnosis Date Noted  . Chronic pelvic pain in female 06/14/2018  . Dysmenorrhea 06/14/2018  . Menorrhagia with regular cycle 06/14/2018  . Stiffness of finger  joint, 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  . Closed displaced fracture of neck of fifth metacarpal bone of left hand 03/07/2018  . Ulcerative colitis   . GERD (gastroesophageal reflux disease)   . Anxiety     Fred Franzen OTR/L,CLT 08/20/2018, 11:02 AM  [facility] [address] [place], [address] Phone: [phone]   Fax:  [phone]  Name: Victoria Pierce MRN: [id number] Date of Birth: 12-07-1980

## 2018-08-21 ENCOUNTER — Encounter: Payer: Self-pay | Admitting: Gastroenterology

## 2018-08-21 ENCOUNTER — Ambulatory Visit (INDEPENDENT_AMBULATORY_CARE_PROVIDER_SITE_OTHER): Payer: Medicaid Other | Admitting: Gastroenterology

## 2018-08-21 DIAGNOSIS — K219 Gastro-esophageal reflux disease without esophagitis: Secondary | ICD-10-CM

## 2018-08-21 MED ORDER — AMITRIPTYLINE HCL 50 MG PO TABS: 50.0000 mg | ORAL_TABLET | Freq: Every day | ORAL | 2 refills | Status: DC

## 2018-08-21 NOTE — Progress Notes (Signed)
Victoria Sear, MD [address]  [place]  [place], [place] [address]  Main: [phone]  Fax: [phone]    Gastroenterology Consultation Video Visit  Referring Provider:     [name], MD Primary Care Physician:  [name], MD Primary Gastroenterologist:  Dr. Cephas Darby Reason for Consultation:     GERD, atypical chest pain, frequent stools        HPI:   Victoria Pierce is a 38 y.o. female referred by Dr. [name], MD  for consultation & management of GERD, atypical chest pain, frequent stools  Virtual Visit Video Note  I connected with Victoria Pierce on 08/21/18 at  9:30 AM EDT by video and verified that I am speaking with the correct person using two identifiers.   I discussed the limitations, risks, security and privacy concerns of performing an evaluation and management service by video and the availability of in person appointments. I also discussed with the patient that there may be a patient responsible charge related to this service. The patient expressed understanding and agreed to proceed.  Location of the Patient: Home  Location of the provider: Home office  Persons participating in the visit: Patient and provider   History of Present Illness: I initially saw Victoria Pierce in 04/2018 for chronic GERD, atypical chest pain, lower GI symptoms.  She has been on Protonix 40 mg twice daily for several years.  She was also consuming Goody powder.  She underwent EGD and colonoscopy by me which revealed medium size hiatal hernia, otherwise unremarkable.  Duodenal biopsies revealed mild villous blunting with no intraepithelial lymphocytes and has celiac serologies came back negative.  Colonoscopy was unremarkable including biopsies as well as terminal ileum.  She completely stopped consuming BC powder.  She takes naproxen seldom.  She continues to smoke tobacco.  She is currently on Protonix 40 mg twice daily.  I referred her to general surgery  for fundoplication given the size of hernia and long-term PPI use, Dr. Perrin Maltese saw her in 06/2018.  Her procedure was postponed due to COVID-19 pandemic  Her lower abdominal pain, she underwent ultrasound pelvis which was normal.  Underwent IUD placement about a month ago and she thinks her symptoms are improving   She does not have any major concerns today other than 3 loose, nonbloody bowel movements in the morning  NSAIDs: Naproxen occasionally  Antiplts/Anticoagulants/Anti thrombotics: None  GI Procedures: EGD 05/14/2018 - Normal duodenal bulb and second portion of the duodenum. Biopsied. - 4 cm hiatal hernia. - Erythematous mucosa in the gastric body and antrum. Biopsied. - Non-bleeding erosive gastropathy. - Esophagogastric landmarks identified. - Normal esophagus. Biopsied.  Colonoscopy 05/14/2018 - The examined portion of the ileum was normal. - Normal mucosa in the entire examined colon. Biopsied. - Diverticulosis in the sigmoid colon and in the descending colon. - The distal rectum and anal verge are normal on retroflexion view.  DIAGNOSIS:  A. DUODENUM; BIOPSY:  - FEW AREAS OF VILLOUS BLUNTING WITHOUT INCREASED INTRAEPITHELIAL  LYMPHOCYTES.  - SEE COMMENT.   B. STOMACH, RANDOM; BIOPSIES:  - BENIGN GASTRIC MUCOSA WITH FOCAL FIBROSIS IN LAMINA PROPRIA AND  REACTIVE/REGENERATIVE APPEARING GLANDS.  - NEGATIVE FOR ACTIVE MUCOSAL INFLAMMATION, H. PYLORI, INTESTINAL  METAPLASIA AND DYSPLASIA.  - SEE COMMENT.   C. ESOPHAGUS; BIOPSY:  - NO SIGNIFICANT PATHOLOGIC FEATURES.   D. COLON, RANDOM; BIOPSIES:  - NEGATIVE FOR ACTIVE MUCOSAL INFLAMMATION, GRANULOMAS AND  LYMPHOCYTIC/MICROSCOPIC COLITIS.  - FEATURES OF IDIOPATHIC  CHRONIC INFLAMMATORY BOWEL DISEASE (ULCERATIVE  COLITIS) ARE NOT SEEN.  - NO DYSPLASIA OR MALIGNANCY IDENTIFIED.  Past Medical History:  Diagnosis Date  . Anxiety   . Asthma   . Bipolar 1 disorder ([place])   . Depression   . Diverticulitis   . GERD  (gastroesophageal reflux disease)   . Hernia, abdominal   . Ulcerative colitis   . Vaginal septum     Past Surgical History:  Procedure Laterality Date  . CLOSED REDUCTION METACARPAL WITH PERCUTANEOUS PINNING Left 03/07/2018   Procedure: CLOSED REDUCTION METACARPAL WITH PERCUTANEOUS PINNING-5th metacarpal;  Surgeon: Corky Mull, MD;  Location: [facility] ORS;  Service: Orthopedics;  Laterality: Left;  . COLONOSCOPY WITH PROPOFOL N/A 05/14/2018   Procedure: COLONOSCOPY WITH PROPOFOL;  Surgeon: Lin Landsman, MD;  Location: [facility] ENDOSCOPY;  Service: Gastroenterology;  Laterality: N/A;  . ESOPHAGOGASTRODUODENOSCOPY (EGD) WITH PROPOFOL N/A 05/14/2018   Procedure: ESOPHAGOGASTRODUODENOSCOPY (EGD) WITH PROPOFOL;  Surgeon: Lin Landsman, MD;  Location: [facility] END[facility]COPY;  Service: Gastroenterology;  Laterality: N/A;  . HARDWARE REMOVAL Left 04/03/2018   Procedure: LEFT HAND FIFTH METACARPAL PIN REMOVAL;  Surgeon: Corky Mull, MD;  Location: [place];  Service: Orthopedics;  Laterality: Left;  . TRIGGER FINGER RELEASE Left 06/19/2018   Procedure: EXTENSOR TENOLYSIS WITH CAPSULAR RELEASE OF LEFT LITTLE MCP JOINT;  Surgeon: Corky Mull, MD;  Location: [place];  Service: Orthopedics;  Laterality: Left;  . TUBAL LIGATION      Current Outpatient Medications:  .  albuterol (PROVENTIL HFA;VENTOLIN HFA) 108 (90 Base) MCG/ACT inhaler, Inhale 2 puffs into the lungs every 6 (six) hours as needed for wheezing or shortness of breath., Disp: , Rfl:  .  cariprazine (VRAYLAR) capsule, Take 1.5 mg by mouth 2 (two) times daily., Disp: , Rfl:  .  cyclobenzaprine (FLEXERIL) 10 MG tablet, Take 10 mg by mouth 3 (three) times daily as needed for muscle spasms. , Disp: , Rfl:  .  gabapentin (NEURONTIN) 300 MG capsule, Take 900 mg by mouth 2 (two) times daily. , Disp: , Rfl:  .  guanFACINE (INTUNIV) 1 MG TB24 ER tablet, Take 1 mg by mouth every morning. , Disp: , Rfl:  .  Melatonin 5 MG CAPS,  Take 5 mg by mouth at bedtime., Disp: , Rfl:  .  Multiple Vitamin (MULTIVITAMIN WITH MINERALS) TABS tablet, Take 1 tablet by mouth daily., Disp: , Rfl:  .  naproxen (NAPROSYN) 500 MG tablet, Take 500 mg by mouth 2 (two) times daily as needed for moderate pain. , Disp: , Rfl:  .  promethazine (PHENERGAN) 25 MG tablet, Take 25 mg by mouth every 6 (six) hours as needed for nausea or vomiting., Disp: , Rfl:  .  propranolol (INDERAL) 10 MG tablet, Take 10 mg by mouth 2 (two) times daily., Disp: , Rfl:  .  topiramate (TOPAMAX) 50 MG tablet, Take 50 mg by mouth 2 (two) times daily., Disp: , Rfl:  .  traZODone (DESYREL) 100 MG tablet, Take 100 mg by mouth at bedtime., Disp: , Rfl:  .  vitamin B-12 (CYANOCOBALAMIN) 1000 MCG tablet, Take 1,000 mcg by mouth daily., Disp: , Rfl:  .  pantoprazole (PROTONIX) 40 MG tablet, Take 1 tablet (40 mg total) by mouth 2 (two) times daily before a meal., Disp: 180 tablet, Rfl: 1  Current Facility-Administered Medications:  .  levonorgestrel (MIRENA) 20 MCG/24HR IUD, , Intrauterine, Once, Schuman, Christanna R, MD   Family History  Problem Relation Age of Onset  .  Hypertension Sister      Social History   Tobacco Use  . Smoking status: Current Every Day Smoker    Packs/day: 1.00    Years: 27.00    Pack years: 27.00    Types: Cigarettes  . Smokeless tobacco: Never Used  Substance Use Topics  . Alcohol use: No  . Drug use: Yes    Types: Marijuana    Allergies as of 08/21/2018  . (No Known Allergies)     Imaging Studies: Reviewed  Assessment and Plan:   ARITZA BRUNET is a 38 y.o. Caucasian female with history of bipolar, chronic lower abdominal pain, bloating, intermittent rectal bleeding, atypical chest pain and heartburn  Atypical chest pain and heartburn Continue Protonix 40 mg twice daily EGD with esophageal and gastric biopsies were unremarkable Has medium size hiatal hernia, evaluated by general surgery for fundoplication, postponed  due to COVID-19 pandemic  Chronic lower abdominal pain associated with bloating and intermittent rectal bleeding:   Symptoms have improved after the IUD placement Colonoscopy was unremarkable including biopsies.  She does not have inflammatory bowel disease She has IUD placed   Follow Up Instructions:   I discussed the assessment and treatment plan with the patient. The patient was provided an opportunity to ask questions and all were answered. The patient agreed with the plan and demonstrated an understanding of the instructions.   The patient was advised to call back or seek an in-person evaluation if the symptoms worsen or if the condition fails to improve as anticipated.  I provided 15 minutes of face-to-face time during this encounter.   Follow up after hernia repair   Cephas Darby, MD

## 2018-08-22 ENCOUNTER — Ambulatory Visit: Payer: Medicaid Other | Admitting: Occupational Therapy

## 2018-08-22 ENCOUNTER — Other Ambulatory Visit: Payer: Self-pay

## 2018-08-22 DIAGNOSIS — M25642 Stiffness of left hand, not elsewhere classified: Secondary | ICD-10-CM | POA: Diagnosis not present

## 2018-08-22 DIAGNOSIS — M79642 Pain in left hand: Secondary | ICD-10-CM

## 2018-08-22 DIAGNOSIS — R600 Localized edema: Secondary | ICD-10-CM

## 2018-08-22 DIAGNOSIS — M6281 Muscle weakness (generalized): Secondary | ICD-10-CM

## 2018-08-22 NOTE — Patient Instructions (Signed)
Same HEP but add new joint jack for PIP extention for 5th  And putty - doing PIP extention for 4thand 5th into putty ( into 2/3 of putty)

## 2018-08-22 NOTE — Therapy (Signed)
[facility] [address] [place], [address] Phone: [phone]   Fax:  [phone]  Occupational Therapy Treatment  Patient Details  Name: Victoria Pierce MRN: [id number] Date of Birth: Jul 06, 1980 Referring Provider (OT): Poggi   Encounter Date: 08/22/2018  OT End of Session - 08/22/18 0919    Visit Number  13    Number of Visits  16    Date for OT Re-Evaluation  09/19/18    Authorization - Visit Number  13    Authorization - Number of Visits  15    OT Start Time  0902    OT Stop Time  0955    OT Time Calculation (min)  53 min    Activity Tolerance  Patient tolerated treatment well    Behavior During Therapy  [facility] for tasks assessed/performed       Past Medical History:  Diagnosis Date  . Anxiety   . Asthma   . Bipolar 1 disorder ([place])   . Depression   . Diverticulitis   . GERD (gastroesophageal reflux disease)   . Hernia, abdominal   . Ulcerative colitis   . Vaginal septum     Past Surgical History:  Procedure Laterality Date  . CLOSED REDUCTION METACARPAL WITH PERCUTANEOUS PINNING Left 03/07/2018   Procedure: CLOSED REDUCTION METACARPAL WITH PERCUTANEOUS PINNING-5th metacarpal;  Surgeon: Corky Mull, MD;  Location: [facility] ORS;  Service: Orthopedics;  Laterality: Left;  . COLONOSCOPY WITH PROPOFOL N/A 05/14/2018   Procedure: COLONOSCOPY WITH PROPOFOL;  Surgeon: Lin Landsman, MD;  Location: [facility] ENDOSCOPY;  Service: Gastroenterology;  Laterality: N/A;  . ESOPHAGOGASTRODUODENOSCOPY (EGD) WITH PROPOFOL N/A 05/14/2018   Procedure: ESOPHAGOGASTRODUODENOSCOPY (EGD) WITH PROPOFOL;  Surgeon: Lin Landsman, MD;  Location: [facility] ENDOSCOPY;  Service: Gastroenterology;  Laterality: N/A;  . HARDWARE REMOVAL Left 04/03/2018   Procedure: LEFT HAND FIFTH METACARPAL PIN REMOVAL;  Surgeon: Corky Mull, MD;  Location: [place];  Service: Orthopedics;  Laterality: Left;  . TRIGGER FINGER  RELEASE Left 06/19/2018   Procedure: EXTENSOR TENOLYSIS WITH CAPSULAR RELEASE OF LEFT LITTLE MCP JOINT;  Surgeon: Corky Mull, MD;  Location: [place];  Service: Orthopedics;  Laterality: Left;  . TUBAL LIGATION      There were no vitals filed for this visit.  Subjective Assessment - 08/22/18 0916    Subjective   Done okay - Just the pinkie do not want to stay straight - I did the cast with the knucklebender - it pulls - but I need it - did not sleep with the night splint for bending of knuckles     Patient Stated Goals  To get my motion and strength back in my L hand - and bend my pinkie and ring finger     Currently in Pain?  Yes    Pain Score  3     Pain Location  Hand    Pain Orientation  Left    Pain Descriptors / Indicators  Aching    Pain Type  Surgical pain    Pain Onset  More than a month ago    Pain Frequency  Constant         [facility] OT Assessment - 08/22/18 0001      Left Hand AROM   L Ring  MCP 0-90  90 Degrees    L Ring PIP 0-100  95 Degrees   -25 and in session -20 and PROM -5   L Little  MCP 0-90  82 Degrees   in session 85 and PROM 90   L Little PIP 0-100  -32 Degrees   in session -25 to -30       Scar massage done using vibration over dorsal 5th MC - andcontcica scar pad and silicon digi sleeve for night time use     done graston tool nr 2 on volar 4th and 5th prior to PROMfor PIP extentoin Done some joint mobs and gentle traction on PIP and DIP of 4th and 5th-- pt can do at home but have to stabilize proximal phalanges.  Prolonged extention stretch to PIP  And DIP  Teal putty rolling for digits extention - 20 reps And then add this date doing PIP extention into putty - in 2/3 of teal putty - but block MC at neutral - 8-10 reps   Ptto cont  wearing joint jack on 5th and 4th PIP ( but did get new one and fitted to 5th for better fit - pt ed on donning and wearing - pt could not get enough pressure - can also use LMB splint for  PIP  ContHEP - 2-3 session of heat , LMB or joint jack to 4th and 5th PIP extention , rolling of putty and stretch for extention behind PIP joint  Fabricated PIP extention cast to 4th and 5th last visit  to wear at night time and during day as much as she can - in combination with knuckle bender     Cont at home but decrease to 2-3 session for flexioncoban wrap her 5th incompositeflexion while doing heat  5-10 min  PROM for MC flexion , composite flexion  Knuckle bender 5 x day Then place and hold -for composite fist -was able to do AROM at 80-82 this date in session and PROM 90            OT Treatments/Exercises (OP) - 08/22/18 0001      LUE Paraffin   Number Minutes Paraffin  8 Minutes    LUE Paraffin Location  Hand    Comments  extention splint for 4th and 5th PIP at [facility]              OT Education - 08/22/18 0959    Education Details  new joint jack application for  5th PIP ext , and wear PIP extention cast wiht knuckle bender and putty for PIP extention     Person(s) Educated  Patient    Methods  Demonstration;Verbal cues;Handout    Comprehension  Returned demonstration       OT Short Term Goals - 08/22/18 1005      OT SHORT TERM GOAL #1   Title  Pt to be ind in HEP to decrease edema, pain and increase AROM in 4th ad 5th digit     Baseline  Extention of PIP's 4th -25, 5th -30 ;  flexion 5th MC 30 , PIP 70 ; 4th MC 55 and PIP 80 ; MONTH AGO-30 4th , -45 PIP 5th ; MC flexion 4th 80 , 5th 60  NOW 4th  -25, 5th -32  flexion MC 4th 90,IP 95; 5th MC 82 , PIP 90     Status  Achieved      OT SHORT TERM GOAL #2   Title  AROM in L 4thand 5th digit improve for pt to grip 2 cm cylinder - brush, knife     Status  Achieved        OT Long Term  Goals - 08/22/18 1009      OT LONG TERM GOAL #1   Title  Pain on PRWHE improve with more than 12 points     Baseline   PRWHE  for pain 46/50  and now 27/50 ( 08/15/18)    Status  Achieved      OT LONG TERM  GOAL #2   Title  AROM in 4thand 5th improve for pt to touch palm to hold small object in palm     Baseline  SOC of session 80-85 degrees - except if rainy weather 70 degrees - show increase strenght into putty- small object still fall out     Time  4    Period  Weeks    Status  On-going    Target Date  09/19/18      OT LONG TERM GOAL #3   Title  Grip strength in L improve to more than 50% to carry more than 5 lbs, cut with knife ,and squeeze washcloth     Baseline  83 lbs R , 55 on L - can carry 8 lbs without issues, and cut wit knife -but not yet tight on squeezing washcloth     Time  4    Period  Weeks    Status  On-going    Target Date  09/19/18      OT LONG TERM GOAL #4   Title  Extention of PIP's of 4th and 5th improve to less than -25 degrees to be able to donn gloves or reach into pocket     Baseline  just started last week adressing PIP - because of last surgery was capsular release for 5th MC - PIP of 4th -30 , and 5th -35    Time  4    Period  Weeks    Status  New    Target Date  09/19/18            Plan - 08/22/18 1000    Clinical Impression Statement  Pt is 9 wks s/p for extensor tenolysis and capsular release of 5th MC - 5th MC flexion improving to 80-82  coming in - able to start adressing PIP exention since last week per MD orders - now that [facility] flexion progress well -PIP extention of 4th progressing better than 5th PIP -fabricated last time  PIP extention casts  For 4thand 5th PIP extention  -and can discharge MC flexion splint at night as long as MC flexion  progression staying same - add new joint jack this time to 5th PIP      OT Occupational Profile and History  Problem Focused Assessment - Including review of records relating to presenting problem    Occupational performance deficits (Please refer to evaluation for details):  ADL's;IADL's;Leisure;Play    Body Structure / Function / Physical Skills  ADL;Dexterity;Flexibility;ROM;Strength;Scar  mobility;FMC;Edema;Coordination;Pain;UE functional use    Rehab Potential  Good    Clinical Decision Making  Several treatment options, min-mod task modification necessary    Comorbidities Affecting Occupational Performance:  May have comorbidities impacting occupational performance    OT Frequency  2x / week    OT Duration  4 weeks    OT Treatment/Interventions  Self-care/ADL training;Cryotherapy;Paraffin;Therapeutic exercise;Splinting;Scar mobilization;Manual Therapy;Fluidtherapy;Contrast Bath;Passive range of motion;Patient/family education    Plan  new night time extention cast - and check on joint jack wear and progress at [facility] and PIP     OT Home Exercise Plan  see pt instruction     Consulted  and Agree with Plan of Care  Patient       Patient will benefit from skilled therapeutic intervention in order to improve the following deficits and impairments:  Body Structure / Function / Physical Skills  Visit Diagnosis: Stiffness of left hand, not elsewhere classified  Pain in left hand  Localized edema  Muscle weakness (generalized)    Problem List Patient Active Problem List   Diagnosis Date Noted  . Chronic pelvic pain in female 06/14/2018  . Dysmenorrhea 06/14/2018  . Menorrhagia with regular cycle 06/14/2018  . Stiffness of finger joint, 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  . Closed displaced fracture of neck of fifth metacarpal bone of left hand 03/07/2018  . Ulcerative colitis   . GERD (gastroesophageal reflux disease)   . Anxiety     Rosalyn Gess OTR/L,CLT 08/22/2018, 10:12 AM  [facility] [address], [place], [address] Phone: [phone]   Fax:  [phone]  Name: MILA PAIR MRN: [id number] Date of Birth: 19-Feb-1981

## 2018-08-23 ENCOUNTER — Telehealth: Payer: Self-pay | Admitting: Obstetrics and Gynecology

## 2018-08-23 NOTE — Telephone Encounter (Signed)
Had IUD placed 07/23/18 with [facility], has questions. CB# [phone]

## 2018-08-23 NOTE — Telephone Encounter (Signed)
Please advise 

## 2018-08-26 ENCOUNTER — Encounter: Payer: Self-pay | Admitting: Gastroenterology

## 2018-08-26 NOTE — Telephone Encounter (Signed)
Called and spoke with patient. Having persistent daily spotting since insertion of IUD, discussed that this is normal after initially insertion and generally resolves in 1-6 months. Patient says she continues to have daily mild cramping which is better with use of naproxen. She often does not take naproxen though because she has had complications taking it in the past of extended periods. She will wait to see how symptoms are over next several months.

## 2018-08-27 ENCOUNTER — Encounter: Payer: Medicaid Other | Admitting: Occupational Therapy

## 2018-08-28 ENCOUNTER — Telehealth: Payer: Self-pay | Admitting: Surgery

## 2018-08-28 ENCOUNTER — Encounter: Payer: Self-pay | Admitting: Gastroenterology

## 2018-08-28 NOTE — Telephone Encounter (Signed)
Message left for the patient letting her know that we cannot reschedule her surgery a [facility] yet. We have not been given a surgery start date. When we get this we will be calling her to get her surgery rescheduled.

## 2018-08-28 NOTE — Telephone Encounter (Signed)
Patient is calling asking when she will be able to have her surgery reschedule. Please call patient and advise.

## 2018-08-29 ENCOUNTER — Other Ambulatory Visit: Payer: Self-pay

## 2018-08-29 ENCOUNTER — Ambulatory Visit: Payer: Medicaid Other | Admitting: Occupational Therapy

## 2018-08-29 DIAGNOSIS — M79642 Pain in left hand: Secondary | ICD-10-CM

## 2018-08-29 DIAGNOSIS — M25642 Stiffness of left hand, not elsewhere classified: Secondary | ICD-10-CM | POA: Diagnosis not present

## 2018-08-29 DIAGNOSIS — R600 Localized edema: Secondary | ICD-10-CM

## 2018-08-29 DIAGNOSIS — M6281 Muscle weakness (generalized): Secondary | ICD-10-CM

## 2018-08-29 NOTE — Therapy (Signed)
[facility] [address] [place], [address] Phone: [phone]   Fax:  [phone]  Occupational Therapy Treatment  Patient Details  Name: Victoria Pierce MRN: [id number] Date of Birth: 1980/08/08 Referring Provider (OT): Poggi   Encounter Date: 08/29/2018  OT End of Session - 08/29/18 1513    Visit Number  14    Number of Visits  16    Date for OT Re-Evaluation  09/19/18    Authorization - Visit Number  14    Authorization - Number of Visits  23    OT Start Time  1005    OT Stop Time  1058    OT Time Calculation (min)  53 min    Activity Tolerance  Patient tolerated treatment well    Behavior During Therapy  [place] for tasks assessed/performed       Past Medical History:  Diagnosis Date  . Anxiety   . Asthma   . Bipolar 1 disorder ([place])   . Depression   . Diverticulitis   . GERD (gastroesophageal reflux disease)   . Hernia, abdominal   . Ulcerative colitis   . Vaginal septum     Past Surgical History:  Procedure Laterality Date  . CLOSED REDUCTION METACARPAL WITH PERCUTANEOUS PINNING Left 03/07/2018   Procedure: CLOSED REDUCTION METACARPAL WITH PERCUTANEOUS PINNING-5th metacarpal;  Surgeon: Corky Mull, MD;  Location: [facility] ORS;  Service: Orthopedics;  Laterality: Left;  . COLONOSCOPY WITH PROPOFOL N/A 05/14/2018   Procedure: COLONOSCOPY WITH PROPOFOL;  Surgeon: Lin Landsman, MD;  Location: [facility] ENDOSCOPY;  Service: Gastroenterology;  Laterality: N/A;  . ESOPHAGOGASTRODUODENOSCOPY (EGD) WITH PROPOFOL N/A 05/14/2018   Procedure: ESOPHAGOGASTRODUODENOSCOPY (EGD) WITH PROPOFOL;  Surgeon: Lin Landsman, MD;  Location: [facility] ENDOSCOPY;  Service: Gastroenterology;  Laterality: N/A;  . HARDWARE REMOVAL Left 04/03/2018   Procedure: LEFT HAND FIFTH METACARPAL PIN REMOVAL;  Surgeon: Corky Mull, MD;  Location: [place];  Service: Orthopedics;  Laterality: Left;  . TRIGGER FINGER  RELEASE Left 06/19/2018   Procedure: EXTENSOR TENOLYSIS WITH CAPSULAR RELEASE OF LEFT LITTLE MCP JOINT;  Surgeon: Corky Mull, MD;  Location: [place];  Service: Orthopedics;  Laterality: Left;  . TUBAL LIGATION      There were no vitals filed for this visit.  Subjective Assessment - 08/29/18 1004    Subjective   I using my hand much better - involving my ring finger not most all the time but still stick pinkie up in air - I did get my parafffin bath to use at home     Patient Stated Goals  To get my motion and strength back in my L hand - and bend my pinkie and ring finger     Currently in Pain?  Yes    Pain Score  3     Pain Location  Hand    Pain Orientation  Left    Pain Descriptors / Indicators  Aching    Pain Type  Surgical pain    Pain Onset  More than a month ago         [facility] OT Assessment - 08/29/18 0001      Strength   Right Hand Grip (lbs)  83    Right Hand Lateral Pinch  18 lbs    Right Hand 3 Point Pinch  22 lbs    Left Hand Grip (lbs)  60    Left Hand Lateral Pinch  18 lbs  Left Hand 3 Point Pinch  20 lbs      Left Hand AROM   L Ring  MCP 0-90  90 Degrees    L Ring PIP 0-100  95 Degrees   -30 coming in , -20 in session -and PROM -5   L Little  MCP 0-90  78 Degrees   in session 85-90 - and ext SOC -32, in session -25               OT Treatments/Exercises (OP) - 08/29/18 0001      LUE Paraffin   Number Minutes Paraffin  8 Minutes    LUE Paraffin Location  Hand    Comments  hand in fist - increase 5th MC flexion  at [facility]         Scar massage done using vibration over dorsal 5th MC - andcontcica scar pad and silicon digi sleeve for night time use But add again this date kinesiotape to be done to proximal scar during day - 30% pull parallel and 100% pull across   PROM and prolonged flexion stretch to 4th and 5th MC - and then composite flexion   place and hold , followed by AROM  85 -90 degrees at 5th    done graston  tool nr 2 on volar 4th and 5th prior to PROMfor PIP extentoin Done some joint mobs and gentle traction on PIP and DIP of 4th and 5th-- pt can do at home but have to stabilize proximal phalanges.  Prolonged extention stretch to PIP  And DIP  Teal putty rolling for digits extention - 20 reps Pushing into table for digits extention  PIP extention into putty - in 2/3 of teal putty - but block MC at neutral - 8-10 reps   Ptto contwearing joint jack on 5th and 4th PIP ( but did get new one and fitted to 5th for better fit - pt ed on donning and wearing - pt could not get enough pressure - can also use LMB splint for PIP  ContHEP - 2-3 session of heat , LMB or joint jack to 4th and 5th PIP extention , rolling of putty and stretch for extention behind PIP joint  ReFabricated PIP extention cast to 4th and 5th  - the ones from last  Visit wast to loose -   to wear at night time and during day as much as she can -  But not anymore in combination with knuckle bender-did nto feel she could get enough flexion stretch at 5th MC    Cont at home but decrease to 2-3 session for flexioncoban wrap her 5th incompositeflexion while doing heat  5-10 min  PROM for MC flexion , composite flexion  Knuckle bender 5 x day Then place and hold -for composite fist -was able to do AROM at 80-82 this date in session and PROM 90          OT Education - 08/29/18 1513    Education Details  cont joint jack application for  5th PIP ext ,  knuckle bender  without PIP extention cast and putty for PIP extention     Person(s) Educated  Patient    Methods  Demonstration;Verbal cues;Handout    Comprehension  Returned demonstration       OT Short Term Goals - 08/22/18 1005      OT SHORT TERM GOAL #1   Title  Pt to be ind in HEP to decrease edema, pain and increase AROM in 4th  ad 5th digit     Baseline  Extention of PIP's 4th -25, 5th -30 ;  flexion 5th MC 30 , PIP 70 ; 4th MC 55 and PIP 80 ;  MONTH AGO-30 4th , -45 PIP 5th ; MC flexion 4th 80 , 5th 60  NOW 4th  -25, 5th -32  flexion MC 4th 90,IP 95; 5th MC 82 , PIP 90     Status  Achieved      OT SHORT TERM GOAL #2   Title  AROM in L 4thand 5th digit improve for pt to grip 2 cm cylinder - brush, knife     Status  Achieved        OT Long Term Goals - 08/22/18 1009      OT LONG TERM GOAL #1   Title  Pain on PRWHE improve with more than 12 points     Baseline   PRWHE  for pain 46/50  and now 27/50 ( 08/15/18)    Status  Achieved      OT LONG TERM GOAL #2   Title  AROM in 4thand 5th improve for pt to touch palm to hold small object in palm     Baseline  SOC of session 80-85 degrees - except if rainy weather 70 degrees - show increase strenght into putty- small object still fall out     Time  4    Period  Weeks    Status  On-going    Target Date  09/19/18      OT LONG TERM GOAL #3   Title  Grip strength in L improve to more than 50% to carry more than 5 lbs, cut with knife ,and squeeze washcloth     Baseline  83 lbs R , 55 on L - can carry 8 lbs without issues, and cut wit knife -but not yet tight on squeezing washcloth     Time  4    Period  Weeks    Status  On-going    Target Date  09/19/18      OT LONG TERM GOAL #4   Title  Extention of PIP's of 4th and 5th improve to less than -25 degrees to be able to donn gloves or reach into pocket     Baseline  just started last week adressing PIP - because of last surgery was capsular release for 5th MC - PIP of 4th -30 , and 5th -35    Time  4    Period  Weeks    Status  New    Target Date  09/19/18            Plan - 08/29/18 1514    Clinical Impression Statement  Pt is 10 wks s/p extensor tenolysis and capsular release of 5th MC - pt was only seen one time this week - PIP extention about same as last week - did  fabricate new PIP casts - it was to loose the ones from last time - flexion of MC about same 78-82 - are able get in session 85-90 MC flexion - cont to  increase motion -showed great progress in grip and prehension strength     OT Occupational Profile and History  Problem Focused Assessment - Including review of records relating to presenting problem    Occupational performance deficits (Please refer to evaluation for details):  ADL's;IADL's;Leisure;Play    Body Structure / Function / Physical Skills  ADL;Dexterity;Flexibility;ROM;Strength;Scar mobility;FMC;Edema;Coordination;Pain;UE functional use    Rehab Potential  Good    Clinical Decision Making  Several treatment options, min-mod task modification necessary    Comorbidities Affecting Occupational Performance:  May have comorbidities impacting occupational performance    OT Frequency  2x / week    OT Duration  4 weeks    OT Treatment/Interventions  Self-care/ADL training;Cryotherapy;Paraffin;Therapeutic exercise;Splinting;Scar mobilization;Manual Therapy;Fluidtherapy;Contrast Bath;Passive range of motion;Patient/family education    Plan  new night time extention cast - and check on joint jack wear and progress at [facility] and PIP     OT Home Exercise Plan  see pt instruction     Consulted and Agree with Plan of Care  Patient       Patient will benefit from skilled therapeutic intervention in order to improve the following deficits and impairments:  Body Structure / Function / Physical Skills  Visit Diagnosis: Stiffness of left hand, not elsewhere classified  Pain in left hand  Localized edema  Muscle weakness (generalized)    Problem List Patient Active Problem List   Diagnosis Date Noted  . Chronic pelvic pain in female 06/14/2018  . Dysmenorrhea 06/14/2018  . Menorrhagia with regular cycle 06/14/2018  . Stiffness of finger joint, 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  . Closed displaced fracture of neck of fifth metacarpal bone of left hand 03/07/2018  . Ulcerative colitis   . GERD (gastroesophageal reflux disease)   . Anxiety     Rosalyn Gess OTR/L,CLT 08/29/2018, 3:18 PM  [facility] [address] [place], [address] Phone: [phone]   Fax:  [phone]  Name: Victoria Pierce MRN: [id number] Date of Birth: 1980/09/14

## 2018-08-29 NOTE — Patient Instructions (Signed)
Pt to cont same HEP -but removed PIP extention cast to wear knuckle bender -  And after joint jack use and rolling over foam roller - do extention of digits into table prior to PIP extention into putty

## 2018-08-30 ENCOUNTER — Telehealth: Payer: Self-pay | Admitting: *Deleted

## 2018-08-30 NOTE — Telephone Encounter (Signed)
Patient want to be scheduled ASAP at [facility]

## 2018-09-03 ENCOUNTER — Other Ambulatory Visit: Payer: Self-pay

## 2018-09-03 ENCOUNTER — Ambulatory Visit: Payer: Medicaid Other | Admitting: Occupational Therapy

## 2018-09-03 DIAGNOSIS — R600 Localized edema: Secondary | ICD-10-CM

## 2018-09-03 DIAGNOSIS — M25642 Stiffness of left hand, not elsewhere classified: Secondary | ICD-10-CM | POA: Diagnosis not present

## 2018-09-03 DIAGNOSIS — M6281 Muscle weakness (generalized): Secondary | ICD-10-CM

## 2018-09-03 DIAGNOSIS — M79642 Pain in left hand: Secondary | ICD-10-CM

## 2018-09-03 NOTE — Therapy (Signed)
[facility] [address] [place], [address] Phone: [phone]   Fax:  [phone]  Occupational Therapy Treatment  Patient Details  Name: Victoria Pierce MRN: [id number] Date of Birth: 06-04-1980 Referring Provider (OT): Poggi   Encounter Date: 09/03/2018  OT End of Session - 09/03/18 1016    Visit Number  15    Number of Visits  16    Date for OT Re-Evaluation  09/19/18    Authorization - Visit Number  15    Authorization - Number of Visits  23    OT Start Time  1000    OT Stop Time  1041    OT Time Calculation (min)  41 min    Activity Tolerance  Patient tolerated treatment well    Behavior During Therapy  [facility] for tasks assessed/performed       Past Medical History:  Diagnosis Date  . Anxiety   . Asthma   . Bipolar 1 disorder ([place])   . Depression   . Diverticulitis   . GERD (gastroesophageal reflux disease)   . Hernia, abdominal   . Ulcerative colitis   . Vaginal septum     Past Surgical History:  Procedure Laterality Date  . CLOSED REDUCTION METACARPAL WITH PERCUTANEOUS PINNING Left 03/07/2018   Procedure: CLOSED REDUCTION METACARPAL WITH PERCUTANEOUS PINNING-5th metacarpal;  Surgeon: Corky Mull, MD;  Location: [facility] ORS;  Service: Orthopedics;  Laterality: Left;  . COLONOSCOPY WITH PROPOFOL N/A 05/14/2018   Procedure: COLONOSCOPY WITH PROPOFOL;  Surgeon: Lin Landsman, MD;  Location: [facility];  Service: Gastroenterology;  Laterality: N/A;  . ESOPHAGOGASTRODUODENOSCOPY (EGD) WITH PROPOFOL N/A 05/14/2018   Procedure: ESOPHAGOGASTRODUODENOSCOPY (EGD) WITH PROPOFOL;  Surgeon: Lin Landsman, MD;  Location: [facility] ENDOSCOPY;  Service: Gastroenterology;  Laterality: N/A;  . HARDWARE REMOVAL Left 04/03/2018   Procedure: LEFT HAND FIFTH METACARPAL PIN REMOVAL;  Surgeon: Corky Mull, MD;  Location: [place];  Service: Orthopedics;  Laterality: Left;  . TRIGGER FINGER  RELEASE Left 06/19/2018   Procedure: EXTENSOR TENOLYSIS WITH CAPSULAR RELEASE OF LEFT LITTLE MCP JOINT;  Surgeon: Corky Mull, MD;  Location: [place];  Service: Orthopedics;  Laterality: Left;  . TUBAL LIGATION      There were no vitals filed for this visit.  Subjective Assessment - 09/03/18 1013    Subjective   My hand is sore today because of the rain - but look my fingers are more straight - I wore the cast at night time for about 8-9 hrs and about 2 x during day for 30 min     Patient Stated Goals  To get my motion and strength back in my L hand - and bend my pinkie and ring finger     Currently in Pain?  Yes    Pain Score  4     Pain Location  Hand    Pain Orientation  Left    Pain Descriptors / Indicators  Aching    Pain Type  Surgical pain    Pain Onset  More than a month ago         [facility] OT Assessment - 09/03/18 0001      Left Hand AROM   L Ring  MCP 0-90  90 Degrees    L Ring PIP 0-100  90 Degrees   coming in -20 , in session -17 and PROM 0   L Little  MCP 0-90  80 Degrees  L Little PIP 0-100  92 Degrees   - 26 coming in - 25 end of session - PROM -5      great progress since last week in PIP extention 5th and 4th         OT Treatments/Exercises (OP) - 09/03/18 0001      LUE Paraffin   Number Minutes Paraffin  8 Minutes    LUE Paraffin Location  Hand    Comments  LMB splint on 4thand 5th for extention       Scar massage done using vibration over dorsal 5th MC - andcontcica scar pad and silicon digi sleeve for night time use But add again this date kinesiotape to be done to proximal scar during day - 30% pull parallel and 100% pull across - progress since last week   PROM and prolonged flexion stretch to 4th and 5th MC - and then composite flexion   place and hold , followed by AROM  85 -90 degrees at 5th    done graston tool nr 2 on volar 4th and 5th prior to PROMfor PIP extentoin Done some joint mobs and gentle traction on  PIP and DIP of 4th and 5th-- pt can do at home but have to stabilize proximal phalanges. Prolonged extention stretch to PIPAnd DIP Teal putty rolling for digits extention - 20 reps Pushing into table for digits extention  PIP extention into putty - in 2/3 of teal putty - but block MC at neutral - 8-10 reps  Ptto contwearing joint jack on 5th and 4th PIP ( but did get new one and fitted to 5th for better fit - pt ed on donning and wearing- pt could not get enough pressure - can also use LMB splint for PIP  ContHEP - 2-3 session of heat , LMB or joint jack to 4th and 5th PIP extention , rolling of putty and stretch for extention behind PIP joint  Cont with same  PIP extention cast to 4th and 5th - will assess if need to increase extention next visit - to wear at night time and during day as much as she can -  But not anymore in combination with knuckle bender-did nto feel she could get enough flexion stretch at 5th MC    Cont at home but decrease to 2-3 session for flexioncoban wrap her 5th incompositeflexion while doing heat  5-10 min  PROM for MC flexion , composite flexion  Knuckle bender 5 x day Then place and hold -for composite fist -was able to do AROM at 80-82 this date in session and PROM 90           OT Education - 09/03/18 1016    Education Details  cont joint jack application for  5th PIP ext ,  knuckle bender  without PIP extention cast and putty for PIP extention     Person(s) Educated  Patient    Methods  Demonstration;Verbal cues;Handout    Comprehension  Returned demonstration       OT Short Term Goals - 08/22/18 1005      OT SHORT TERM GOAL #1   Title  Pt to be ind in HEP to decrease edema, pain and increase AROM in 4th ad 5th digit     Baseline  Extention of PIP's 4th -25, 5th -30 ;  flexion 5th MC 30 , PIP 70 ; 4th MC 55 and PIP 80 ; MONTH AGO-30 4th , -45 PIP 5th ; MC flexion 4th 80 ,  5th 60  NOW 4th  -25, 5th -32  flexion  MC 4th 90,IP 95; 5th MC 82 , PIP 90     Status  Achieved      OT SHORT TERM GOAL #2   Title  AROM in L 4thand 5th digit improve for pt to grip 2 cm cylinder - brush, knife     Status  Achieved        OT Long Term Goals - 08/22/18 1009      OT LONG TERM GOAL #1   Title  Pain on PRWHE improve with more than 12 points     Baseline   PRWHE  for pain 46/50  and now 27/50 ( 08/15/18)    Status  Achieved      OT LONG TERM GOAL #2   Title  AROM in 4thand 5th improve for pt to touch palm to hold small object in palm     Baseline  SOC of session 80-85 degrees - except if rainy weather 70 degrees - show increase strenght into putty- small object still fall out     Time  4    Period  Weeks    Status  On-going    Target Date  09/19/18      OT LONG TERM GOAL #3   Title  Grip strength in L improve to more than 50% to carry more than 5 lbs, cut with knife ,and squeeze washcloth     Baseline  83 lbs R , 55 on L - can carry 8 lbs without issues, and cut wit knife -but not yet tight on squeezing washcloth     Time  4    Period  Weeks    Status  On-going    Target Date  09/19/18      OT LONG TERM GOAL #4   Title  Extention of PIP's of 4th and 5th improve to less than -25 degrees to be able to donn gloves or reach into pocket     Baseline  just started last week adressing PIP - because of last surgery was capsular release for 5th MC - PIP of 4th -30 , and 5th -35    Time  4    Period  Weeks    Status  New    Target Date  09/19/18            Plan - 09/03/18 1016    Clinical Impression Statement  Pt is about 11 wks s/p extensor tenolysis and capsular release of L 5th - pt show increase extention of 4th and 5th PIP - after adding kinesiotape to scar last week and remaking PIP extention cast for 4than 5th - cont to increase 5th MC flexion and 4th and 5th PIP     OT Occupational Profile and History  Problem Focused Assessment - Including review of records relating to presenting problem     Occupational performance deficits (Please refer to evaluation for details):  ADL's;IADL's;Leisure;Play    Body Structure / Function / Physical Skills  ADL;Dexterity;Flexibility;ROM;Strength;Scar mobility;FMC;Edema;Coordination;Pain;UE functional use    Rehab Potential  Good    Clinical Decision Making  Several treatment options, min-mod task modification necessary    Comorbidities Affecting Occupational Performance:  May have comorbidities impacting occupational performance    OT Frequency  2x / week    OT Duration  4 weeks    OT Treatment/Interventions  Self-care/ADL training;Cryotherapy;Paraffin;Therapeutic exercise;Splinting;Scar mobilization;Manual Therapy;Fluidtherapy;Contrast Bath;Passive range of motion;Patient/family education    Plan  adjust night  time extention cast - and check on joint jack wear and progress at [facility] and PIP     OT Home Exercise Plan  see pt instruction     Consulted and Agree with Plan of Care  Patient       Patient will benefit from skilled therapeutic intervention in order to improve the following deficits and impairments:  Body Structure / Function / Physical Skills  Visit Diagnosis: Stiffness of left hand, not elsewhere classified  Pain in left hand  Localized edema  Muscle weakness (generalized)    Problem List Patient Active Problem List   Diagnosis Date Noted  . Chronic pelvic pain in female 06/14/2018  . Dysmenorrhea 06/14/2018  . Menorrhagia with regular cycle 06/14/2018  . Stiffness of finger joint, 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  . Closed displaced fracture of neck of fifth metacarpal bone of left hand 03/07/2018  . Ulcerative colitis   . GERD (gastroesophageal reflux disease)   . Anxiety     Rosalyn Gess OTR/L,CLT 09/03/2018, 12:03 PM  [facility] [address] [place], [address] Phone: [phone]   Fax:  [phone]  Name: Victoria Pierce MRN: [id number] Date of  Birth: 04/20/1980

## 2018-09-03 NOTE — Patient Instructions (Signed)
Same

## 2018-09-05 ENCOUNTER — Ambulatory Visit: Payer: Medicaid Other | Admitting: Occupational Therapy

## 2018-09-05 ENCOUNTER — Other Ambulatory Visit: Payer: Self-pay

## 2018-09-05 DIAGNOSIS — M79642 Pain in left hand: Secondary | ICD-10-CM

## 2018-09-05 DIAGNOSIS — M25642 Stiffness of left hand, not elsewhere classified: Secondary | ICD-10-CM | POA: Diagnosis not present

## 2018-09-05 DIAGNOSIS — M6281 Muscle weakness (generalized): Secondary | ICD-10-CM

## 2018-09-05 DIAGNOSIS — R600 Localized edema: Secondary | ICD-10-CM

## 2018-09-05 NOTE — Patient Instructions (Signed)
On 5th use joint jack and extention gutter splint to decrease pressure on dorsal  PIP of 5th

## 2018-09-05 NOTE — Therapy (Signed)
[facility] [address], [place], [address] Phone: [phone]   Fax:  [phone]  Occupational Therapy Treatment  Patient Details  Name: Victoria Pierce MRN: [id number] Date of Birth: April 19, 1980 Referring Provider (OT): Poggi   Encounter Date: 09/05/2018  OT End of Session - 09/05/18 1059    Visit Number  16    Number of Visits  20    Date for OT Re-Evaluation  09/19/18    Authorization - Visit Number  16    Authorization - Number of Visits  23    OT Start Time  1101    OT Stop Time  1151    OT Time Calculation (min)  50 min    Activity Tolerance  Patient tolerated treatment well    Behavior During Therapy  [facility] for tasks assessed/performed       Past Medical History:  Diagnosis Date  . Anxiety   . Asthma   . Bipolar 1 disorder ([place])   . Depression   . Diverticulitis   . GERD (gastroesophageal reflux disease)   . Hernia, abdominal   . Ulcerative colitis   . Vaginal septum     Past Surgical History:  Procedure Laterality Date  . CLOSED REDUCTION METACARPAL WITH PERCUTANEOUS PINNING Left 03/07/2018   Procedure: CLOSED REDUCTION METACARPAL WITH PERCUTANEOUS PINNING-5th metacarpal;  Surgeon: Corky Mull, MD;  Location: [facility] ORS;  Service: Orthopedics;  Laterality: Left;  . COLONOSCOPY WITH PROPOFOL N/A 05/14/2018   Procedure: COLONOSCOPY WITH PROPOFOL;  Surgeon: Lin Landsman, MD;  Location: [facility] ENDOSCOPY;  Service: Gastroenterology;  Laterality: N/A;  . ESOPHAGOGASTRODUODENOSCOPY (EGD) WITH PROPOFOL N/A 05/14/2018   Procedure: ESOPHAGOGASTRODUODENOSCOPY (EGD) WITH PROPOFOL;  Surgeon: Lin Landsman, MD;  Location: [facility] ENDOSCOPY;  Service: Gastroenterology;  Laterality: N/A;  . HARDWARE REMOVAL Left 04/03/2018   Procedure: LEFT HAND FIFTH METACARPAL PIN REMOVAL;  Surgeon: Corky Mull, MD;  Location: [place];  Service: Orthopedics;  Laterality: Left;  . TRIGGER FINGER  RELEASE Left 06/19/2018   Procedure: EXTENSOR TENOLYSIS WITH CAPSULAR RELEASE OF LEFT LITTLE MCP JOINT;  Surgeon: Corky Mull, MD;  Location: [place];  Service: Orthopedics;  Laterality: Left;  . TUBAL LIGATION      There were no vitals filed for this visit.  Subjective Assessment - 09/05/18 1059    Subjective   Considering it is rainy weather for about 3 days my pain is not to bad - stiff little more - need to get new wax for my paraffin bath - do not get that warm like yours     Patient Stated Goals  To get my motion and strength back in my L hand - and bend my pinkie and ring finger     Currently in Pain?  Yes    Pain Score  2     Pain Location  Hand    Pain Orientation  Left    Pain Descriptors / Indicators  Aching    Pain Type  Surgical pain    Pain Onset  More than a month ago    Pain Frequency  Constant         [facility] OT Assessment - 09/05/18 0001      Left Hand AROM   L Ring  MCP 0-90  90 Degrees    L Ring PIP 0-100  90 Degrees   -30 SOC , -20 in session - PROM 0   L Little  MCP  0-90  80 Degrees    L Little PIP 0-100  90 Degrees   -30 coming in , -25 in session and PROM 0       Progress not as good as earlier this week in PIP extention - but did had 3 days of cold and wet weather         OT Treatments/Exercises (OP) - 09/05/18 0001      LUE Paraffin   Number Minutes Paraffin  8 Minutes    LUE Paraffin Location  Hand    Comments  LMB splint on 4th and 5th PIP - with heat at [facility]      Scar massage done using vibration over dorsal 5th MC - andcontcica scar pad and silicon digi sleeve for night time use To cont  Again with  kinesiotape to be done to proximal scar during day - 30% pull parallel and 100% pull across- progress since last week   PROM and prolonged flexion stretch to 4th and 5th MC - and then composite flexion  place and hold , followed by AROM  85 -90 degrees at 5th   done graston tool nr 2 on volar 4th and 5th prior to  PROMfor PIP extentoin Done some joint mobs and gentle traction on PIP and DIP of 4th and 5th-- pt can do at home but have to stabilize proximal phalanges. Prolonged extention stretch to PIPAnd DIP Teal putty rolling for digits extention - 20 reps Pushing into table for digits extention PIP extention into putty - in 2/3 of teal putty - but block MC at neutral - 8-10 reps  Ptto contwearing joint jack on 5th  More during day instead of cast - dorsal PIP little tender -  ContHEP - 2-3 session of heat , LMB or joint jack to 4th and 5th PIP extention , rolling of putty and stretch for extention behind PIP joint   fabricate new cast for 4th PIP and for 5th did PIP extention gutter splint to wear  At night time    Cont at home but decrease to 2-3 session for flexioncoban wrap her 5th incompositeflexion while doing heat  5-10 min  PROM for MC flexion , composite flexion  Knuckle bender 5 x day Then place and hold -for composite fist -was able to do AROM at 80-82 this date in session and PROM 90            OT Education - 09/05/18 1059    Education Details   splint changes on 5th adn cont joint jack application for  5th PIP ext ,  knuckle bender  without PIP extention cast and putty for PIP extention     Person(s) Educated  Patient    Methods  Demonstration;Verbal cues;Handout;Explanation    Comprehension  Returned demonstration       OT Short Term Goals - 08/22/18 1005      OT SHORT TERM GOAL #1   Title  Pt to be ind in HEP to decrease edema, pain and increase AROM in 4th ad 5th digit     Baseline  Extention of PIP's 4th -25, 5th -30 ;  flexion 5th MC 30 , PIP 70 ; 4th MC 55 and PIP 80 ; MONTH AGO-30 4th , -45 PIP 5th ; MC flexion 4th 80 , 5th 60  NOW 4th  -25, 5th -32  flexion MC 4th 90,IP 95; 5th MC 82 , PIP 90     Status  Achieved      OT  SHORT TERM GOAL #2   Title  AROM in L 4thand 5th digit improve for pt to grip 2 cm cylinder - brush, knife      Status  Achieved        OT Long Term Goals - 08/22/18 1009      OT LONG TERM GOAL #1   Title  Pain on PRWHE improve with more than 12 points     Baseline   PRWHE  for pain 46/50  and now 27/50 ( 08/15/18)    Status  Achieved      OT LONG TERM GOAL #2   Title  AROM in 4thand 5th improve for pt to touch palm to hold small object in palm     Baseline  SOC of session 80-85 degrees - except if rainy weather 70 degrees - show increase strenght into putty- small object still fall out     Time  4    Period  Weeks    Status  On-going    Target Date  09/19/18      OT LONG TERM GOAL #3   Title  Grip strength in L improve to more than 50% to carry more than 5 lbs, cut with knife ,and squeeze washcloth     Baseline  83 lbs R , 55 on L - can carry 8 lbs without issues, and cut wit knife -but not yet tight on squeezing washcloth     Time  4    Period  Weeks    Status  On-going    Target Date  09/19/18      OT LONG TERM GOAL #4   Title  Extention of PIP's of 4th and 5th improve to less than -25 degrees to be able to donn gloves or reach into pocket     Baseline  just started last week adressing PIP - because of last surgery was capsular release for 5th MC - PIP of 4th -30 , and 5th -35    Time  4    Period  Weeks    Status  New    Target Date  09/19/18            Plan - 09/05/18 1100    Clinical Impression Statement  Pt is about 11 1/2 wks s/p extensor tenolysis and capsular release of 5th - considiring the rainy and cooler weather the last 3 days - pt pain is doing very well and ROM - did loose some extention compare to earlier this year -but was able to repeat progress in session - did adjust cast on 4th for increase extnetion - but made extention PIP gutter splint to provide pt with another option to decrease pressure on 5th dorsal PIP - pt report it was tender from wearing the cast     OT Occupational Profile and History  Problem Focused Assessment - Including review of records  relating to presenting problem    Occupational performance deficits (Please refer to evaluation for details):  ADL's;IADL's;Leisure;Play    Body Structure / Function / Physical Skills  ADL;Dexterity;Flexibility;ROM;Strength;Scar mobility;FMC;Edema;Coordination;Pain;UE functional use    Rehab Potential  Good    Clinical Decision Making  Several treatment options, min-mod task modification necessary    Comorbidities Affecting Occupational Performance:  May have comorbidities impacting occupational performance    OT Frequency  2x / week    OT Duration  4 weeks    OT Treatment/Interventions  Self-care/ADL training;Cryotherapy;Paraffin;Therapeutic exercise;Splinting;Scar mobilization;Manual Therapy;Fluidtherapy;Contrast Bath;Passive range of motion;Patient/family education  Plan  wearing of gutter extention splint onf 5th - and check on joint jack wear and progress at [facility] and PIP     OT Home Exercise Plan  see pt instruction     Consulted and Agree with Plan of Care  Patient       Patient will benefit from skilled therapeutic intervention in order to improve the following deficits and impairments:  Body Structure / Function / Physical Skills  Visit Diagnosis: Stiffness of left hand, not elsewhere classified  Pain in left hand  Localized edema  Muscle weakness (generalized)    Problem List Patient Active Problem List   Diagnosis Date Noted  . Chronic pelvic pain in female 06/14/2018  . Dysmenorrhea 06/14/2018  . Menorrhagia with regular cycle 06/14/2018  . Stiffness of finger joint, 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  . Closed displaced fracture of neck of fifth metacarpal bone of left hand 03/07/2018  . Ulcerative colitis   . GERD (gastroesophageal reflux disease)   . Anxiety     Rosalyn Gess OTR/L,CLT 09/05/2018, 3:43 PM  [facility] [address] [place], [address] Phone: [phone]   Fax:  [phone]  Name:  Victoria Pierce MRN: [id number] Date of Birth: 12-May-1980

## 2018-09-10 ENCOUNTER — Ambulatory Visit: Payer: Medicaid Other | Admitting: Occupational Therapy

## 2018-09-11 ENCOUNTER — Telehealth: Payer: Self-pay | Admitting: *Deleted

## 2018-09-11 NOTE — Telephone Encounter (Signed)
Victoria Pierce prior to surgery I will have to see her in the office. Thanks for checking

## 2018-09-11 NOTE — Telephone Encounter (Signed)
Patient contacted today about getting surgery rescheduled with Dr. Dahlia Byes at [facility]. She is wanting to have this done on 09-26-18 pending O.R. availability due to COVID-19.  The patient is aware that if surgery is scheduled for 09-26-18 that she will need to have COVID-19 testing done on at the [facility] drive thru ([address] [place]) between 10:30 am and 12:30 pm. She is aware to isolate after, have no visitors, wash hands frequently, and avoid touching face.   The patient previously completed a phone interview in March. We will see if this needs to be repeated. Patient is aware she may be contacted again by the [place] [facility] to complete a phone interview sometime in the near future.  Patient aware she will be contacted once [facility] posts surgery with further details.   Note to Dr. Dahlia Byes to see if he will require a pre-op visit.

## 2018-09-12 ENCOUNTER — Ambulatory Visit: Payer: Medicaid Other | Admitting: Occupational Therapy

## 2018-09-12 ENCOUNTER — Other Ambulatory Visit: Payer: Self-pay

## 2018-09-12 DIAGNOSIS — R600 Localized edema: Secondary | ICD-10-CM

## 2018-09-12 DIAGNOSIS — M6281 Muscle weakness (generalized): Secondary | ICD-10-CM

## 2018-09-12 DIAGNOSIS — M79642 Pain in left hand: Secondary | ICD-10-CM

## 2018-09-12 DIAGNOSIS — M25642 Stiffness of left hand, not elsewhere classified: Secondary | ICD-10-CM

## 2018-09-12 NOTE — Therapy (Signed)
[place] [address] [place], [address] Phone: [phone]   Fax:  [phone]  Occupational Therapy Treatment  Patient Details  Name: Victoria Pierce MRN: [id number] Date of Birth: 11-Jan-1981 Referring Provider (OT): Poggi   Encounter Date: 09/12/2018  OT End of Session - 09/12/18 0959    Visit Number  17    Number of Visits  20    Date for OT Re-Evaluation  09/19/18    Authorization - Visit Number  16    Authorization - Number of Visits  23    OT Start Time  0804    OT Stop Time  0904    OT Time Calculation (min)  60 min    Activity Tolerance  Patient tolerated treatment well    Behavior During Therapy  [facility] for tasks assessed/performed       Past Medical History:  Diagnosis Date  . Anxiety   . Asthma   . Bipolar 1 disorder ([place])   . Depression   . Diverticulitis   . GERD (gastroesophageal reflux disease)   . Hernia, abdominal   . Ulcerative colitis   . Vaginal septum     Past Surgical History:  Procedure Laterality Date  . CLOSED REDUCTION METACARPAL WITH PERCUTANEOUS PINNING Left 03/07/2018   Procedure: CLOSED REDUCTION METACARPAL WITH PERCUTANEOUS PINNING-5th metacarpal;  Surgeon: Corky Mull, MD;  Location: [facility] ORS;  Service: Orthopedics;  Laterality: Left;  . COLONOSCOPY WITH PROPOFOL N/A 05/14/2018   Procedure: COLONOSCOPY WITH PROPOFOL;  Surgeon: Lin Landsman, MD;  Location: [facility] ENDOSCOPY;  Service: Gastroenterology;  Laterality: N/A;  . ESOPHAGOGASTRODUODENOSCOPY (EGD) WITH PROPOFOL N/A 05/14/2018   Procedure: ESOPHAGOGASTRODUODENOSCOPY (EGD) WITH PROPOFOL;  Surgeon: Lin Landsman, MD;  Location: [facility] ENDOSCOPY;  Service: Gastroenterology;  Laterality: N/A;  . HARDWARE REMOVAL Left 04/03/2018   Procedure: LEFT HAND FIFTH METACARPAL PIN REMOVAL;  Surgeon: Corky Mull, MD;  Location: [place];  Service: Orthopedics;  Laterality: Left;  . TRIGGER FINGER  RELEASE Left 06/19/2018   Procedure: EXTENSOR TENOLYSIS WITH CAPSULAR RELEASE OF LEFT LITTLE MCP JOINT;  Surgeon: Corky Mull, MD;  Location: [place];  Service: Orthopedics;  Laterality: Left;  . TUBAL LIGATION      There were no vitals filed for this visit.  Subjective Assessment - 09/12/18 0955    Subjective   I did not had a good week - mentally - my fingers are getting more straight - but the fist is tight this week with all this rain - they called me - hernia surgery scedule for 11 June     Patient Stated Goals  To get my motion and strength back in my L hand - and bend my pinkie and ring finger     Currently in Pain?  Yes    Pain Score  2     Pain Location  Hand    Pain Orientation  Left    Pain Descriptors / Indicators  Aching    Pain Type  Surgical pain    Pain Onset  More than a month ago         [facility] OT Assessment - 09/12/18 0001      Strength   Right Hand Grip (lbs)  83    Right Hand Lateral Pinch  18 lbs    Right Hand 3 Point Pinch  22 lbs    Left Hand Grip (lbs)  60    Left Hand Lateral  Pinch  16 lbs    Left Hand 3 Point Pinch  21 lbs      Left Hand AROM   L Ring  MCP 0-90  90 Degrees    L Ring PIP 0-100  95 Degrees   -25 coming in - PROM 0   L Little  MCP 0-90  80 Degrees   in session 85 and PROM 90    L Little PIP 0-100  90 Degrees   coming in -32 and PROM 0              OT Treatments/Exercises (OP) - 09/12/18 0001      LUE Paraffin   Number Minutes Paraffin  8 Minutes    LUE Paraffin Location  Hand    Comments  LMB splint on 4thadn 5th for PIP extention        Scar massage done using vibration and xtractor with AROM flexion , extention  over dorsal 5th MC - andprovided newcica scar pad for night time use To cont  Again with  kinesiotape to be done to proximal scar during day - 30% pull parallel and 100% pull across- xtra provided     done graston tool nr 2 on volar 4th and 5th prior to PROMfor PIP extentoin Done  some joint mobs and gentle traction on PIP and DIP of 4th and 5th-- pt can do at home but have to stabilize proximal phalanges. Prolonged extention stretch to PIPAnd DIP- PROM for 4th and 5th PIP 0 degrees  Can cont with Teal putty rolling for digits extention - 20 reps Pushing into table for digits extention PIP extention into putty - in 2/3 of teal putty - but block MC at neutral - 8-10 reps  Ptto contwearing joint jack on 5th  More during day instead of cast - dorsal PIP little tender -  PROM and prolonged flexion stretch to 4th and 5th MC - and then composite flexion  place and hold , followed by AROM  85 -90 degrees at 5th  ContHEP - 2-3 session of heat , LMB or joint jack to 4th and 5th PIP extention , rolling of putty and stretch for extention behind PIP joint   modified cast to gutter splint on 5th PIP - and use elastic velcro to keep in place     Cont at home but decrease to 2-3 session for flexioncoban wrap her 5th incompositeflexion while doing heat  5-10 min  PROM for MC flexion , composite flexion  Knuckle bender 5 x day Then place and hold -for composite fist -was able to do AROM at 80-82 this date in session and PROM 90          OT Education - 09/12/18 0958    Education Details  reinforce what to focus on for HEP     Person(s) Educated  Patient    Methods  Demonstration;Verbal cues;Handout;Explanation    Comprehension  Returned demonstration;Verbalized understanding       OT Short Term Goals - 08/22/18 1005      OT SHORT TERM GOAL #1   Title  Pt to be ind in HEP to decrease edema, pain and increase AROM in 4th ad 5th digit     Baseline  Extention of PIP's 4th -25, 5th -30 ;  flexion 5th MC 30 , PIP 70 ; 4th MC 55 and PIP 80 ; MONTH AGO-30 4th , -45 PIP 5th ; MC flexion 4th 80 , 5th 60  NOW 4th  -25,  5th -32  flexion MC 4th 90,IP 95; 5th MC 82 , PIP 90     Status  Achieved      OT SHORT TERM GOAL #2   Title  AROM in L 4thand  5th digit improve for pt to grip 2 cm cylinder - brush, knife     Status  Achieved        OT Long Term Goals - 08/22/18 1009      OT LONG TERM GOAL #1   Title  Pain on PRWHE improve with more than 12 points     Baseline   PRWHE  for pain 46/50  and now 27/50 ( 08/15/18)    Status  Achieved      OT LONG TERM GOAL #2   Title  AROM in 4thand 5th improve for pt to touch palm to hold small object in palm     Baseline  SOC of session 80-85 degrees - except if rainy weather 70 degrees - show increase strenght into putty- small object still fall out     Time  4    Period  Weeks    Status  On-going    Target Date  09/19/18      OT LONG TERM GOAL #3   Title  Grip strength in L improve to more than 50% to carry more than 5 lbs, cut with knife ,and squeeze washcloth     Baseline  83 lbs R , 55 on L - can carry 8 lbs without issues, and cut wit knife -but not yet tight on squeezing washcloth     Time  4    Period  Weeks    Status  On-going    Target Date  09/19/18      OT LONG TERM GOAL #4   Title  Extention of PIP's of 4th and 5th improve to less than -25 degrees to be able to donn gloves or reach into pocket     Baseline  just started last week adressing PIP - because of last surgery was capsular release for 5th MC - PIP of 4th -30 , and 5th -35    Time  4    Period  Weeks    Status  New    Target Date  09/19/18            Plan - 09/12/18 0959    Clinical Impression Statement  Pt is about 3 months out from s/p extensor tenolysis and capsular release of L 5th - pt show steady progress in flexion of 4thadn 5th flexion - and extention of PIP 's of 4thand 5th - pt did not make progress this past week in AROM measurement - but  PROM on 4thadn 5th PIP 0 degrees - and 5th PIP not as tight with PROM - scar still adhere cont with increase ROM     OT Occupational Profile and History  Problem Focused Assessment - Including review of records relating to presenting problem    Occupational  performance deficits (Please refer to evaluation for details):  ADL's;IADL's;Leisure;Play    Body Structure / Function / Physical Skills  ADL;Dexterity;Flexibility;ROM;Strength;Scar mobility;FMC;Edema;Coordination;Pain;UE functional use    Rehab Potential  Good    Clinical Decision Making  Several treatment options, min-mod task modification necessary    Comorbidities Affecting Occupational Performance:  May have comorbidities impacting occupational performance    OT Frequency  2x / week    OT Duration  2 weeks    OT Treatment/Interventions  Self-care/ADL  training;Cryotherapy;Paraffin;Therapeutic exercise;Splinting;Scar mobilization;Manual Therapy;Fluidtherapy;Contrast Bath;Passive range of motion;Patient/family education    Plan  assess splint use , HEP and progress with HEP     OT Home Exercise Plan  see pt instruction     Consulted and Agree with Plan of Care  Patient       Patient will benefit from skilled therapeutic intervention in order to improve the following deficits and impairments:  Body Structure / Function / Physical Skills  Visit Diagnosis: Stiffness of left hand, not elsewhere classified  Pain in left hand  Localized edema  Muscle weakness (generalized)    Problem List Patient Active Problem List   Diagnosis Date Noted  . Chronic pelvic pain in female 06/14/2018  . Dysmenorrhea 06/14/2018  . Menorrhagia with regular cycle 06/14/2018  . Stiffness of finger joint, 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  . Closed displaced fracture of neck of fifth metacarpal bone of left hand 03/07/2018  . Ulcerative colitis   . GERD (gastroesophageal reflux disease)   . Anxiety     Rosalyn Gess OTR/L,CLT 09/12/2018, 10:03 AM  [place] [facility] [address] [place], [address] Phone: [phone]   Fax:  [phone]  Name: Victoria Pierce MRN: [id number] Date of Birth: 1980/06/04

## 2018-09-12 NOTE — Telephone Encounter (Signed)
Patient is scheduled for 09/23/18 at 2:15 pm with Dr Dahlia Byes for a pre op visit prior to surgery on 09/26/18.

## 2018-09-12 NOTE — Patient Instructions (Signed)
Same HEP -

## 2018-09-13 NOTE — Telephone Encounter (Signed)
Patient contacted today and appointment with Dr. Dahlia Byes moved up to 11:30 am on 09-23-18 due to patient needing to self isolate after COVID testing.   The patient is aware to go for COVID testing after appointment with Dr. Dahlia Byes on 09-23-18. She verbalizes understanding.

## 2018-09-16 NOTE — Addendum Note (Signed)
Addended by: Caroleen Hamman F on: 09/16/2018 11:30 AM   Modules accepted: Orders, SmartSet

## 2018-09-17 ENCOUNTER — Other Ambulatory Visit: Payer: Self-pay

## 2018-09-17 ENCOUNTER — Ambulatory Visit: Payer: Medicaid Other | Attending: Surgery | Admitting: Occupational Therapy

## 2018-09-17 DIAGNOSIS — M79642 Pain in left hand: Secondary | ICD-10-CM

## 2018-09-17 DIAGNOSIS — M6281 Muscle weakness (generalized): Secondary | ICD-10-CM | POA: Diagnosis present

## 2018-09-17 DIAGNOSIS — R600 Localized edema: Secondary | ICD-10-CM | POA: Diagnosis present

## 2018-09-17 DIAGNOSIS — M25642 Stiffness of left hand, not elsewhere classified: Secondary | ICD-10-CM

## 2018-09-17 NOTE — Therapy (Signed)
[place] [facility] [address], [place], [address] Phone: [phone]   Fax:  [phone]  Occupational Therapy Treatment  Patient Details  Name: Victoria VIRDEN MRN: [id number] Date of Birth: 1980/08/31 Referring Provider (OT): Poggi   Encounter Date: 09/17/2018  OT End of Session - 09/17/18 1112    Visit Number  18    Number of Visits  20    Date for OT Re-Evaluation  09/19/18    Authorization - Visit Number  18    Authorization - Number of Visits  23    OT Start Time  0904    OT Stop Time  0942    OT Time Calculation (min)  38 min    Activity Tolerance  Patient tolerated treatment well    Behavior During Therapy  [facility] for tasks assessed/performed       Past Medical History:  Diagnosis Date  . Anxiety   . Asthma   . Bipolar 1 disorder ([place])   . Depression   . Diverticulitis   . GERD (gastroesophageal reflux disease)   . Hernia, abdominal   . Ulcerative colitis   . Vaginal septum     Past Surgical History:  Procedure Laterality Date  . CLOSED REDUCTION METACARPAL WITH PERCUTANEOUS PINNING Left 03/07/2018   Procedure: CLOSED REDUCTION METACARPAL WITH PERCUTANEOUS PINNING-5th metacarpal;  Surgeon: Corky Mull, MD;  Location: [facility] ORS;  Service: Orthopedics;  Laterality: Left;  . COLONOSCOPY WITH PROPOFOL N/A 05/14/2018   Procedure: COLONOSCOPY WITH PROPOFOL;  Surgeon: Lin Landsman, MD;  Location: [facility] ENDOSCOPY;  Service: Gastroenterology;  Laterality: N/A;  . ESOPHAGOGASTRODUODENOSCOPY (EGD) WITH PROPOFOL N/A 05/14/2018   Procedure: ESOPHAGOGASTRODUODENOSCOPY (EGD) WITH PROPOFOL;  Surgeon: Lin Landsman, MD;  Location: [facility] ENDOSCOPY;  Service: Gastroenterology;  Laterality: N/A;  . HARDWARE REMOVAL Left 04/03/2018   Procedure: LEFT HAND FIFTH METACARPAL PIN REMOVAL;  Surgeon: Corky Mull, MD;  Location: [place];  Service: Orthopedics;  Laterality: Left;  . TRIGGER FINGER  RELEASE Left 06/19/2018   Procedure: EXTENSOR TENOLYSIS WITH CAPSULAR RELEASE OF LEFT LITTLE MCP JOINT;  Surgeon: Corky Mull, MD;  Location: [place];  Service: Orthopedics;  Laterality: Left;  . TUBAL LIGATION      There were no vitals filed for this visit.  Subjective Assessment - 09/17/18 1102    Subjective   I seen Dr Roland Rack yesterday - was mix up with dates - I released me - doing okay - stiff but progressing - and using it more    Patient Stated Goals  To get my motion and strength back in my L hand - and bend my pinkie and ring finger     Currently in Pain?  Yes    Pain Score  2     Pain Location  Hand    Pain Orientation  Left    Pain Descriptors / Indicators  Aching    Pain Type  Surgical pain    Pain Onset  More than a month ago         [facility] OT Assessment - 09/17/18 0001      Strength   Right Hand Grip (lbs)  83    Left Hand Grip (lbs)  61      Left Hand AROM   L Ring  MCP 0-90  90 Degrees    L Ring PIP 0-100  92 Degrees   -30 and in session -20   L Little  MCP 0-90  80 Degrees   PROM 90   L Little PIP 0-100  97 Degrees   -31 and in session -25         AROM and PROM assess - see flowsheet     OT Treatments/Exercises (OP) - 09/17/18 0001      LUE Paraffin   Number Minutes Paraffin  8 Minutes    LUE Paraffin Location  Hand    Comments  LMB splints on for 5th and 4th PIP        Scar massage done using vibration and xtractor with AROM flexion , extention  over dorsal 5th MC - andto cont with cica scar pad for night time use To contAgain withkinesiotape to be done to proximal scar during day - 30% pull parallel and 100% pull across     done graston tool nr 2 on volar 4th and 5th prior to PROMfor PIP extentoin Done some joint mobs and gentle traction on PIP and DIP of 4th and 5th-- pt can do at home but have to stabilize proximal phalanges. Prolonged extention stretch to PIPAnd DIP in intrinsic plus fist - PROM for 4th  and 5th PIP 0 degrees  And AROM block  Can do at home  Can cont with Teal putty rolling for digits extention - 20 reps Pushing into table for digits extention PIP extention into putty - in 2/3 of teal putty - but block MC at neutral - 8-10 reps  Ptto contwearing joint jack and LMB extention splint in paraffin at home 4th and 5th More during day instead of cast - dorsal PIP little tender -  PROM and prolonged flexion stretch to 4th and 5th MC - and then composite flexion  place and hold , followed by AROM  85 -90 degrees at 5th    Cont at home but decrease to 2-3 session for flexioncoban wrap her 5th incompositeflexion while doing heat  5-10 min  PROM for MC flexion , composite flexion  Then place and hold -for composite fist -was able to do AROM at 80-82 this date in session and PROM 90         OT Education - 09/17/18 1106    Education Details  progress and splint wearing and add intrinsic fist plus PROM and AROM    Person(s) Educated  Patient    Methods  Demonstration;Verbal cues;Handout;Explanation    Comprehension  Returned demonstration;Verbalized understanding       OT Short Term Goals - 08/22/18 1005      OT SHORT TERM GOAL #1   Title  Pt to be ind in HEP to decrease edema, pain and increase AROM in 4th ad 5th digit     Baseline  Extention of PIP's 4th -25, 5th -30 ;  flexion 5th MC 30 , PIP 70 ; 4th MC 55 and PIP 80 ; MONTH AGO-30 4th , -45 PIP 5th ; MC flexion 4th 80 , 5th 60  NOW 4th  -25, 5th -32  flexion MC 4th 90,IP 95; 5th MC 82 , PIP 90     Status  Achieved      OT SHORT TERM GOAL #2   Title  AROM in L 4thand 5th digit improve for pt to grip 2 cm cylinder - brush, knife     Status  Achieved        OT Long Term Goals - 08/22/18 1009      OT LONG TERM GOAL #1   Title  Pain on  PRWHE improve with more than 12 points     Baseline   PRWHE  for pain 46/50  and now 27/50 ( 08/15/18)    Status  Achieved      OT LONG TERM GOAL #2    Title  AROM in 4thand 5th improve for pt to touch palm to hold small object in palm     Baseline  SOC of session 80-85 degrees - except if rainy weather 70 degrees - show increase strenght into putty- small object still fall out     Time  4    Period  Weeks    Status  On-going    Target Date  09/19/18      OT LONG TERM GOAL #3   Title  Grip strength in L improve to more than 50% to carry more than 5 lbs, cut with knife ,and squeeze washcloth     Baseline  83 lbs R , 55 on L - can carry 8 lbs without issues, and cut wit knife -but not yet tight on squeezing washcloth     Time  4    Period  Weeks    Status  On-going    Target Date  09/19/18      OT LONG TERM GOAL #4   Title  Extention of PIP's of 4th and 5th improve to less than -25 degrees to be able to donn gloves or reach into pocket     Baseline  just started last week adressing PIP - because of last surgery was capsular release for 5th MC - PIP of 4th -30 , and 5th -35    Time  4    Period  Weeks    Status  New    Target Date  09/19/18            Plan - 09/17/18 1112    Clinical Impression Statement  Pt about 13 wks out from s/p extensor tenolysis adn capsular release of L 5th - pt show slow but steady progress in extention and flexion of 4th and 5th digit- seen surgeon yesterday and was release - pt cont to walk in about -30 at 4thand 5th PIP - will focus on increase intrinsic fist plus HEP and adjust splints prior to pt having hernia surgery next week     OT Occupational Profile and History  Problem Focused Assessment - Including review of records relating to presenting problem    Occupational performance deficits (Please refer to evaluation for details):  ADL's;IADL's;Leisure;Play    Body Structure / Function / Physical Skills  ADL;Dexterity;Flexibility;ROM;Strength;Scar mobility;FMC;Edema;Coordination;Pain;UE functional use    Rehab Potential  Good    Clinical Decision Making  Several treatment options, min-mod task  modification necessary    Comorbidities Affecting Occupational Performance:  May have comorbidities impacting occupational performance    OT Frequency  2x / week    OT Duration  2 weeks    OT Treatment/Interventions  Self-care/ADL training;Cryotherapy;Paraffin;Therapeutic exercise;Splinting;Scar mobilization;Manual Therapy;Fluidtherapy;Contrast Bath;Passive range of motion;Patient/family education    Plan  assess splint use , HEP and progress with HEP     OT Home Exercise Plan  see pt instruction     Consulted and Agree with Plan of Care  Patient       Patient will benefit from skilled therapeutic intervention in order to improve the following deficits and impairments:  Body Structure / Function / Physical Skills  Visit Diagnosis: Stiffness of left hand, not elsewhere classified  Pain in left hand  Localized  edema  Muscle weakness (generalized)    Problem List Patient Active Problem List   Diagnosis Date Noted  . Chronic pelvic pain in female 06/14/2018  . Dysmenorrhea 06/14/2018  . Menorrhagia with regular cycle 06/14/2018  . Stiffness of finger joint, 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  . Closed displaced fracture of neck of fifth metacarpal bone of left hand 03/07/2018  . Ulcerative colitis   . GERD (gastroesophageal reflux disease)   . Anxiety     Rosalyn Gess OTR/L,CLT 09/17/2018, 11:19 AM  [place] [facility] [address] [place], [address] Phone: [phone]   Fax:  [phone]  Name: MOLLEY HOUSER MRN: [id number] Date of Birth: 1980/12/08

## 2018-09-18 ENCOUNTER — Encounter
Admission: RE | Admit: 2018-09-18 | Discharge: 2018-09-18 | Disposition: A | Discharge status: Home / Self Care | Payer: Medicaid Other | Source: Ambulatory Visit | Attending: Surgery | Admitting: Surgery

## 2018-09-18 ENCOUNTER — Other Ambulatory Visit: Payer: Self-pay

## 2018-09-18 NOTE — Patient Instructions (Signed)
Your procedure is scheduled on: 09-26-18 THURSDAY Report to Same Day Surgery 2nd floor medical mall [facility] Entrance-take elevator on left to 2nd floor.  Check in with surgery information desk.) To find out your arrival time please call [phone] between 1PM - 3PM on 09-25-18 [facility]  Remember: Instructions that are not followed completely may result in serious medical risk, up to and including death, or upon the discretion of your surgeon and anesthesiologist your surgery may need to be rescheduled.    _x___ 1. Do not eat food after midnight the night before your procedure. You may drink clear liquids up to 2 hours before you are scheduled to arrive at the hospital for your procedure.  Do not drink clear liquids within 2 hours of your scheduled arrival to the hospital.  Clear liquids include  --Water or Apple juice without pulp  --Clear carbohydrate beverage such as ClearFast or Gatorade  --Black Coffee or Clear Tea (No milk, no creamers, do not add anything to the coffee or Tea   ____Ensure clear carbohydrate drink on the way to the hospital for bariatric patients  ____Ensure clear carbohydrate drink 3 hours before surgery for Dr Dwyane Luo patients if physician instructed.   No gum chewing or hard candies.     __x__ 2. No Alcohol for 24 hours before or after surgery.   __x__3. No Smoking or e-cigarettes for 24 prior to surgery.  Do not use any chewable tobacco products for at least 6 hour prior to surgery   ____  4. Bring all medications with you on the day of surgery if instructed.    __x__ 5. Notify your doctor if there is any change in your medical condition     (cold, fever, infections).    x___6. On the morning of surgery brush your teeth with toothpaste and water.  You may rinse your mouth with mouth wash if you wish.  Do not swallow any toothpaste or mouthwash.   Do not wear jewelry, make-up, hairpins, clips or nail polish.  Do not wear lotions, powders, or  perfumes. You may wear deodorant.  Do not shave 48 hours prior to surgery. Men may shave face and neck.  Do not bring valuables to the hospital.    [facility] is not responsible for any belongings or valuables.               Contacts, dentures or bridgework may not be worn into surgery.  Leave your suitcase in the car. After surgery it may be brought to your room.  For patients admitted to the hospital, discharge time is determined by your treatment team.  _  Patients discharged the day of surgery will not be allowed to drive home.  You will need someone to drive you home and stay with you the night of your procedure.    Please read over the following fact sheets that you were given:   [facility] Preparing for Surgery    _x___ TAKE THE FOLLOWING MEDICATION THE MORNING OF SURGERY WITH A SMALL SIP OF WATER. These include:  1. VRAYLAR  2. ZYRTEC  3. GABAPENTIN  4. PROTONIX  5. PROPRANOLOL  6. TOMAPAX  ____Fleets enema or Magnesium Citrate as directed.   ____ Use CHG Soap or sage wipes as directed on instruction sheet   _X___ Use inhalers on the day of surgery and bring to hospital day of surgery-USE YOUR ALBUTEROL INHALER AND BRING  ____ Stop Metformin and Janumet 2 days prior to surgery.  ____ Take 1/2 of usual insulin dose the night before surgery and none on the morning surgery.   ____ Follow recommendations from Cardiologist, Pulmonologist or PCP regarding stopping Aspirin, Coumadin, Plavix ,Eliquis, Effient, or Pradaxa, and Pletal.  X____Stop Anti-inflammatories such as Advil, Aleve, Ibuprofen, Motrin, Naproxen, Naprosyn, Goodies powders or aspirin products 7 DAYS PRIOR-OK to take Tylenol    _x___ Stop supplements until after surgery-STOP MELATONIN 7 DAYS PRIOR TO SURGERY   ____ Bring C-Pap to the hospital.

## 2018-09-19 ENCOUNTER — Encounter: Payer: Medicaid Other | Admitting: Occupational Therapy

## 2018-09-19 ENCOUNTER — Other Ambulatory Visit: Payer: Self-pay

## 2018-09-19 ENCOUNTER — Ambulatory Visit: Payer: Medicaid Other | Admitting: Occupational Therapy

## 2018-09-19 DIAGNOSIS — M79642 Pain in left hand: Secondary | ICD-10-CM

## 2018-09-19 DIAGNOSIS — M25642 Stiffness of left hand, not elsewhere classified: Secondary | ICD-10-CM

## 2018-09-19 DIAGNOSIS — R600 Localized edema: Secondary | ICD-10-CM

## 2018-09-19 DIAGNOSIS — M6281 Muscle weakness (generalized): Secondary | ICD-10-CM

## 2018-09-19 NOTE — Therapy (Signed)
[place] [facility] [address], [place], [address] Phone: [phone]   Fax:  [phone]  Occupational Therapy Treatment  Patient Details  Name: Victoria Pierce MRN: [id number] Date of Birth: 1981/04/09 Referring Provider (OT): Poggi   Encounter Date: 09/19/2018  OT End of Session - 09/19/18 0850    Visit Number  19    Number of Visits  19    Date for OT Re-Evaluation  09/19/18    Authorization - Visit Number  34    Authorization - Number of Visits  23    OT Start Time  0803    OT Stop Time  0845    OT Time Calculation (min)  42 min    Activity Tolerance  Patient tolerated treatment well    Behavior During Therapy  [facility] for tasks assessed/performed       Past Medical History:  Diagnosis Date  . Anxiety   . Asthma   . Bipolar 1 disorder ([place])   . Depression   . Diverticulitis   . GERD (gastroesophageal reflux disease)   . Hernia, abdominal   . Ulcerative colitis   . Vaginal septum     Past Surgical History:  Procedure Laterality Date  . CLOSED REDUCTION METACARPAL WITH PERCUTANEOUS PINNING Left 03/07/2018   Procedure: CLOSED REDUCTION METACARPAL WITH PERCUTANEOUS PINNING-5th metacarpal;  Surgeon: Corky Mull, MD;  Location: [facility] ORS;  Service: Orthopedics;  Laterality: Left;  . COLONOSCOPY WITH PROPOFOL N/A 05/14/2018   Procedure: COLONOSCOPY WITH PROPOFOL;  Surgeon: Lin Landsman, MD;  Location: [facility] ENDOSCOPY;  Service: Gastroenterology;  Laterality: N/A;  . ESOPHAGOGASTRODUODENOSCOPY (EGD) WITH PROPOFOL N/A 05/14/2018   Procedure: ESOPHAGOGASTRODUODENOSCOPY (EGD) WITH PROPOFOL;  Surgeon: Lin Landsman, MD;  Location: [facility] ENDOSCOPY;  Service: Gastroenterology;  Laterality: N/A;  . HARDWARE REMOVAL Left 04/03/2018   Procedure: LEFT HAND FIFTH METACARPAL PIN REMOVAL;  Surgeon: Corky Mull, MD;  Location: [place];  Service: Orthopedics;  Laterality: Left;  . TRIGGER FINGER  RELEASE Left 06/19/2018   Procedure: EXTENSOR TENOLYSIS WITH CAPSULAR RELEASE OF LEFT LITTLE MCP JOINT;  Surgeon: Corky Mull, MD;  Location: [place];  Service: Orthopedics;  Laterality: Left;  . TUBAL LIGATION      There were no vitals filed for this visit.  Subjective Assessment - 09/19/18 0848    Subjective   I am doing good- I am having my hernia surgery at last next Thursday - I need new cast for my ring finger and splint for pinkie need to be adjust    Patient Stated Goals  To get my motion and strength back in my L hand - and bend my pinkie and ring finger     Currently in Pain?  No/denies         [facility] OT Assessment - 09/19/18 0001      Strength   Right Hand Grip (lbs)  83    Right Hand Lateral Pinch  18 lbs    Right Hand 3 Point Pinch  22 lbs    Left Hand Grip (lbs)  61    Left Hand Lateral Pinch  16 lbs    Left Hand 3 Point Pinch  21 lbs      Left Hand AROM   L Ring  MCP 0-90  90 Degrees    L Ring PIP 0-100  95 Degrees   -20   L Little  MCP 0-90  80 Degrees  L Little PIP 0-100  97 Degrees   -15     Pt made great progress from [facility] - in ROM of 5th MC flexion ,and PIP extention at [facility] 5th   scar tissue adhesion at 5th [facility] improving and soft tissue and kinesiotape  xtra provided          OT Treatments/Exercises (OP) - 09/19/18 0001      LUE Paraffin   Number Minutes Paraffin  8 Minutes    LUE Paraffin Location  Hand    Comments  LMB splints on for 4thand 5th prior to soft tissue and PROM         Scar massage done using vibrationand xtractor with AROM flexion , extentionover dorsal 5th MC - andto cont with cica scar pad for night time use To contAgain withkinesiotape to be done to proximal scar during day - 30% pull parallel and 100% pull across     done graston tool nr 2 on volar 4th and 5th with  Stretch for PIP extentoin Done some joint mobs and gentle traction on PIP and DIP of 4th and 5th-- pt can do at home but  have to stabilize proximal phalanges. SHe report MC 's did pop over weekend and feels like she has less stiffness  Prolonged extention stretch to PIPAnd DIP in intrinsic plus fist - PROM for 4th and 5th PIP 0 degrees  And AROM block  Can do at home  Can cont withTeal putty rolling for digits extention - 20 reps Pushing into table for digits extention PIP extention into putty - in 2/3 of teal putty - but block MC at neutral - 8-10 reps  Ptto cont LMB extention splint in paraffin at home 4th and 5th   PROM and prolonged flexion stretch to 4th and 5th MC - and then composite flexion  place and hold , followed by AROM  85 -90 degrees at 5th    Fabricated new PIP extention cast for 4th  And adjusted 5th digits extention gutter splint - provided some moldskin for proximal edge       OT Education - 09/19/18 0850    Education Details  discharge instructions and splint wearing     Person(s) Educated  Patient    Methods  Demonstration;Verbal cues;Handout;Explanation    Comprehension  Returned demonstration;Verbalized understanding       OT Short Term Goals - 09/19/18 0847      OT SHORT TERM GOAL #1   Title  Pt to be ind in HEP to decrease edema, pain and increase AROM in 4th ad 5th digit     Status  Achieved    Target Date  07/23/18      OT SHORT TERM GOAL #2   Title  AROM in L 4thand 5th digit improve for pt to grip 2 cm cylinder - brush, knife     Status  Achieved    Target Date  07/23/18        OT Long Term Goals - 09/19/18 0847      OT LONG TERM GOAL #1   Title  Pain on PRWHE improve with more than 12 points     Baseline   PRWHE  for pain 46/50  and now 27/50 ( 08/15/18) and now 10/50    Status  Achieved    Target Date  09/19/18      OT LONG TERM GOAL #2   Title  AROM in 4thand 5th improve for pt to touch palm to hold  small object in palm     Status  Achieved    Target Date  09/19/18      OT LONG TERM GOAL #3   Title  Grip strength in L improve  to more than 50% to carry more than 5 lbs, cut with knife ,and squeeze washcloth     Baseline  83 lbs R , L 61 lbs     Status  Achieved    Target Date  09/19/18      OT LONG TERM GOAL #4   Title  Extention of PIP's of 4th and 5th improve to less than -25 degrees to be able to donn gloves or reach into pocket     Baseline  PIP of 5th -25 and 4th -20     Status  Achieved    Target Date  09/19/18            Plan - 09/19/18 0851    Clinical Impression Statement  Pt is about 13 1/2 wks s/p extensor tenolysis and capsular release of L 5th digit - pt made great progress from [facility] in flexion of 5th MC to 80-90 degrees , and PIP extention in 4thand 5th - pain improved greatly from 46/50 to 10/50 on PRWHE - using hand in most everything - pt having hernia surgery next week and can cont at home with HEP and PIP extention cast and extention splint wearing     OT Occupational Profile and History  Problem Focused Assessment - Including review of records relating to presenting problem    Occupational performance deficits (Please refer to evaluation for details):  ADL's;IADL's;Leisure;Play    Body Structure / Function / Physical Skills  ADL;Dexterity;Flexibility;ROM;Strength;Scar mobility;FMC;Edema;Coordination;Pain;UE functional use    Rehab Potential  Good    Comorbidities Affecting Occupational Performance:  May have comorbidities impacting occupational performance    Plan  discharge instruction    OT Home Exercise Plan  see pt instruction     Consulted and Agree with Plan of Care  Patient       Patient will benefit from skilled therapeutic intervention in order to improve the following deficits and impairments:  Body Structure / Function / Physical Skills  Visit Diagnosis: Stiffness of left hand, not elsewhere classified  Pain in left hand  Localized edema  Muscle weakness (generalized)    Problem List Patient Active Problem List   Diagnosis Date Noted  . Chronic pelvic pain in  female 06/14/2018  . Dysmenorrhea 06/14/2018  . Menorrhagia with regular cycle 06/14/2018  . Stiffness of finger joint, 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  . Closed displaced fracture of neck of fifth metacarpal bone of left hand 03/07/2018  . Ulcerative colitis   . GERD (gastroesophageal reflux disease)   . Anxiety     Rosalyn Gess OTR/L,CLT 09/19/2018, 8:54 AM  [place] [facility] [address] [place], [address] Phone: [phone]   Fax:  [phone]  Name: ELARIA OSIAS MRN: [id number] Date of Birth: [birth year]

## 2018-09-19 NOTE — Patient Instructions (Signed)
Cont same HEP for soft tissue , stretches for PIP extention and 5th MC flexion  Cont cast and splints

## 2018-09-23 ENCOUNTER — Encounter: Payer: Self-pay | Admitting: Surgery

## 2018-09-23 ENCOUNTER — Other Ambulatory Visit
Admission: RE | Admit: 2018-09-23 | Discharge: 2018-09-23 | Disposition: A | Discharge status: Home / Self Care | Payer: Medicaid Other | Source: Ambulatory Visit | Attending: Surgery | Admitting: Surgery

## 2018-09-23 ENCOUNTER — Other Ambulatory Visit: Payer: Self-pay

## 2018-09-23 ENCOUNTER — Ambulatory Visit: Payer: Medicaid Other | Admitting: Surgery

## 2018-09-23 VITALS — BP 162/120 | HR 93 | Temp 98.1°F | Ht 63.0 in | Wt 198.0 lb

## 2018-09-23 DIAGNOSIS — Z1159 Encounter for screening for other viral diseases: Secondary | ICD-10-CM | POA: Diagnosis not present

## 2018-09-23 DIAGNOSIS — K219 Gastro-esophageal reflux disease without esophagitis: Secondary | ICD-10-CM | POA: Diagnosis not present

## 2018-09-23 NOTE — H&P (View-Only) (Signed)
Outpatient Surgical Follow Up  09/23/2018  Victoria Pierce is an 38 y.o. female.   Chief Complaint  Patient presents with  . Pre-op Exam    HPI: Victoria Pierce following up for hiatal hernia.  She does report some intermittent heartburn.  She does report some intermittent feeling that some pills get stuck in her throat.  GERD undergo a barium swallow that I have personally reviewed showing small hiatal hernia and some reflux with the patient in the decubitus position. Gust with the patient in detail about options of continuation of medical management versus repair of hiatal hernia, fundoplication.  Patient feels very strongly about pursuing surgical intervention since she does have significant symptoms. Denies any fevers any chills any respiratory symptoms.  She some pulmonary exacerbation 2 weeks ago but has completely resolved.     Past Medical History:  Diagnosis Date  . Anxiety   . Asthma   . Bipolar 1 disorder ([place])   . Depression   . Diverticulitis   . GERD (gastroesophageal reflux disease)   . Hernia, abdominal   . Ulcerative colitis   . Vaginal septum     Past Surgical History:  Procedure Laterality Date  . CLOSED REDUCTION METACARPAL WITH PERCUTANEOUS PINNING Left 03/07/2018   Procedure: CLOSED REDUCTION METACARPAL WITH PERCUTANEOUS PINNING-5th metacarpal;  Surgeon: Corky Mull, MD;  Location: [facility] ORS;  Service: Orthopedics;  Laterality: Left;  . COLONOSCOPY WITH PROPOFOL N/A 05/14/2018   Procedure: COLONOSCOPY WITH PROPOFOL;  Surgeon: Lin Landsman, MD;  Location: [facility] ENDOSCOPY;  Service: Gastroenterology;  Laterality: N/A;  . ESOPHAGOGASTRODUODENOSCOPY (EGD) WITH PROPOFOL N/A 05/14/2018   Procedure: ESOPHAGOGASTRODUODENOSCOPY (EGD) WITH PROPOFOL;  Surgeon: Lin Landsman, MD;  Location: [facility] ENDOSCOPY;  Service: Gastroenterology;  Laterality: N/A;  . HARDWARE REMOVAL Left 04/03/2018   Procedure: LEFT HAND FIFTH METACARPAL PIN REMOVAL;  Surgeon: Corky Mull, MD;  Location: [place];  Service: Orthopedics;  Laterality: Left;  . TRIGGER FINGER RELEASE Left 06/19/2018   Procedure: EXTENSOR TENOLYSIS WITH CAPSULAR RELEASE OF LEFT LITTLE MCP JOINT;  Surgeon: Corky Mull, MD;  Location: [place];  Service: Orthopedics;  Laterality: Left;  . TUBAL LIGATION      Family History  Problem Relation Age of Onset  . Hypertension Sister     Social History:  reports that she has been smoking cigarettes. She has a 27.00 pack-year smoking history. She has never used smokeless tobacco. She reports current drug use. Drug: Marijuana. She reports that she does not drink alcohol.  Allergies: No Known Allergies  Medications reviewed.    ROS Full ROS performed and is otherwise negative other than what is stated in HPI   BP (!) 162/120   Pulse 93   Temp 98.1 F (36.7 C) (Skin)   Ht 5' 3"  (1.6 m)   Wt 198 lb (89.8 kg)   SpO2 98%   BMI 35.07 kg/m   Physical Exam Vitals signs and nursing note reviewed. Exam conducted with a chaperone present.  Constitutional:      Appearance: Normal appearance. She is normal weight.  Eyes:     General: No scleral icterus.       Right eye: No discharge.        Left eye: No discharge.  Neck:     Musculoskeletal: Normal range of motion. No neck rigidity.  Cardiovascular:     Rate and Rhythm: Normal rate and regular rhythm.  Pulmonary:     Effort: Pulmonary effort is normal. No  respiratory distress.     Breath sounds: No stridor. No wheezing or rhonchi.  Abdominal:     General: Abdomen is flat. There is no distension.     Palpations: Abdomen is soft. There is no mass.     Tenderness: There is no abdominal tenderness.     Hernia: No hernia is present.  Musculoskeletal: Normal range of motion.        General: No swelling or tenderness.  Skin:    General: Skin is warm and dry.     Capillary Refill: Capillary refill takes less than 2 seconds.  Neurological:     General: No focal  deficit present.     Mental Status: She is alert and oriented to person, place, and time.  Psychiatric:        Mood and Affect: Mood normal.        Behavior: Behavior normal.        Thought Content: Thought content normal.        Judgment: Judgment normal.     Assessment/Plan: Sliding hiatal hernia with reflux.  Discussed with patient in detail about options and she has decided to pursue surgical intervention.  We will plan for robotic assisted laparoscopic hiatal hernia repair with Nissen fundoplication.  Procedure discussed with patient in detail.  Risk, benefits and complications including but not limited to: Bleeding, infection, esophageal injury, dysphagia, chronic pain.  Need for re-intervention.  Also discussed with her in detail about diet modification after Nissen fundoplication. Proceed.  Extensive counseling was performed Greater than 50% of the 25 minutes  visit was spent in counseling/coordination of care   Caroleen Hamman, MD [place] Surgeon

## 2018-09-23 NOTE — Progress Notes (Signed)
Outpatient Surgical Follow Up  09/23/2018  Victoria Pierce is an 38 y.o. female.   Chief Complaint  Patient presents with  . Pre-op Exam    HPI: Victoria Pierce following up for hiatal hernia.  She does report some intermittent heartburn.  She does report some intermittent feeling that some pills get stuck in her throat.  GERD undergo a barium swallow that I have personally reviewed showing small hiatal hernia and some reflux with the patient in the decubitus position. Gust with the patient in detail about options of continuation of medical management versus repair of hiatal hernia, fundoplication.  Patient feels very strongly about pursuing surgical intervention since she does have significant symptoms. Denies any fevers any chills any respiratory symptoms.  She some pulmonary exacerbation 2 weeks ago but has completely resolved.     Past Medical History:  Diagnosis Date  . Anxiety   . Asthma   . Bipolar 1 disorder ([place])   . Depression   . Diverticulitis   . GERD (gastroesophageal reflux disease)   . Hernia, abdominal   . Ulcerative colitis   . Vaginal septum     Past Surgical History:  Procedure Laterality Date  . CLOSED REDUCTION METACARPAL WITH PERCUTANEOUS PINNING Left 03/07/2018   Procedure: CLOSED REDUCTION METACARPAL WITH PERCUTANEOUS PINNING-5th metacarpal;  Surgeon: Corky Mull, MD;  Location: [facility] ORS;  Service: Orthopedics;  Laterality: Left;  . COLONOSCOPY WITH PROPOFOL N/A 05/14/2018   Procedure: COLONOSCOPY WITH PROPOFOL;  Surgeon: Lin Landsman, MD;  Location: [facility] ENDOSCOPY;  Service: Gastroenterology;  Laterality: N/A;  . ESOPHAGOGASTRODUODENOSCOPY (EGD) WITH PROPOFOL N/A 05/14/2018   Procedure: ESOPHAGOGASTRODUODENOSCOPY (EGD) WITH PROPOFOL;  Surgeon: Lin Landsman, MD;  Location: [facility] ENDOSCOPY;  Service: Gastroenterology;  Laterality: N/A;  . HARDWARE REMOVAL Left 04/03/2018   Procedure: LEFT HAND FIFTH METACARPAL PIN REMOVAL;  Surgeon: Corky Mull, MD;  Location: [place];  Service: Orthopedics;  Laterality: Left;  . TRIGGER FINGER RELEASE Left 06/19/2018   Procedure: EXTENSOR TENOLYSIS WITH CAPSULAR RELEASE OF LEFT LITTLE MCP JOINT;  Surgeon: Corky Mull, MD;  Location: [place];  Service: Orthopedics;  Laterality: Left;  . TUBAL LIGATION      Family History  Problem Relation Age of Onset  . Hypertension Sister     Social History:  reports that she has been smoking cigarettes. She has a 27.00 pack-year smoking history. She has never used smokeless tobacco. She reports current drug use. Drug: Marijuana. She reports that she does not drink alcohol.  Allergies: No Known Allergies  Medications reviewed.    ROS Full ROS performed and is otherwise negative other than what is stated in HPI   BP (!) 162/120   Pulse 93   Temp 98.1 F (36.7 C) (Skin)   Ht 5' 3"  (1.6 m)   Wt 198 lb (89.8 kg)   SpO2 98%   BMI 35.07 kg/m   Physical Exam Vitals signs and nursing note reviewed. Exam conducted with a chaperone present.  Constitutional:      Appearance: Normal appearance. She is normal weight.  Eyes:     General: No scleral icterus.       Right eye: No discharge.        Left eye: No discharge.  Neck:     Musculoskeletal: Normal range of motion. No neck rigidity.  Cardiovascular:     Rate and Rhythm: Normal rate and regular rhythm.  Pulmonary:     Effort: Pulmonary effort is normal. No  respiratory distress.     Breath sounds: No stridor. No wheezing or rhonchi.  Abdominal:     General: Abdomen is flat. There is no distension.     Palpations: Abdomen is soft. There is no mass.     Tenderness: There is no abdominal tenderness.     Hernia: No hernia is present.  Musculoskeletal: Normal range of motion.        General: No swelling or tenderness.  Skin:    General: Skin is warm and dry.     Capillary Refill: Capillary refill takes less than 2 seconds.  Neurological:     General: No focal  deficit present.     Mental Status: She is alert and oriented to person, place, and time.  Psychiatric:        Mood and Affect: Mood normal.        Behavior: Behavior normal.        Thought Content: Thought content normal.        Judgment: Judgment normal.     Assessment/Plan: Sliding hiatal hernia with reflux.  Discussed with patient in detail about options and she has decided to pursue surgical intervention.  We will plan for robotic assisted laparoscopic hiatal hernia repair with Nissen fundoplication.  Procedure discussed with patient in detail.  Risk, benefits and complications including but not limited to: Bleeding, infection, esophageal injury, dysphagia, chronic pain.  Need for re-intervention.  Also discussed with her in detail about diet modification after Nissen fundoplication. Proceed.  Extensive counseling was performed Greater than 50% of the 25 minutes  visit was spent in counseling/coordination of care   Caroleen Hamman, MD [place] Surgeon

## 2018-09-24 LAB — NOVEL CORONAVIRUS, NAA (HOSP ORDER, SEND-OUT TO REF LAB; TAT 18-24 HRS): SARS-CoV-2, NAA: NOT DETECTED

## 2018-09-25 ENCOUNTER — Encounter: Payer: Self-pay | Admitting: Anesthesiology

## 2018-09-25 MED ORDER — CEFAZOLIN SODIUM-DEXTROSE 2-4 GM/100ML-% IV SOLN
2.0000 g | INTRAVENOUS | Status: AC
  Administered 2018-09-26: 2 g via INTRAVENOUS

## 2018-09-26 ENCOUNTER — Other Ambulatory Visit: Payer: Self-pay

## 2018-09-26 ENCOUNTER — Observation Stay: Payer: Medicaid Other

## 2018-09-26 ENCOUNTER — Inpatient Hospital Stay: Payer: Medicaid Other | Admitting: Anesthesiology

## 2018-09-26 ENCOUNTER — Encounter
Admission: RE | Disposition: A | Discharge status: Home / Self Care | Payer: Self-pay | Source: Home / Self Care | Attending: Surgery

## 2018-09-26 ENCOUNTER — Observation Stay
Admission: RE | Admit: 2018-09-26 | Discharge: 2018-09-27 | Disposition: A | Discharge status: Home / Self Care | Payer: Medicaid Other | Attending: Surgery | Admitting: Surgery

## 2018-09-26 ENCOUNTER — Encounter: Payer: Self-pay | Admitting: *Deleted

## 2018-09-26 DIAGNOSIS — F17210 Nicotine dependence, cigarettes, uncomplicated: Secondary | ICD-10-CM | POA: Insufficient documentation

## 2018-09-26 DIAGNOSIS — Z9889 Other specified postprocedural states: Secondary | ICD-10-CM

## 2018-09-26 DIAGNOSIS — K219 Gastro-esophageal reflux disease without esophagitis: Secondary | ICD-10-CM

## 2018-09-26 DIAGNOSIS — K449 Diaphragmatic hernia without obstruction or gangrene: Principal | ICD-10-CM

## 2018-09-26 HISTORY — PX: ROBOTIC ASSISTED LAPAROSCOPIC NISSEN FUNDOPLICATION: SHX6509

## 2018-09-26 LAB — CBC
HCT: 40.6 % (ref 36.0–46.0)
Hemoglobin: 13.1 g/dL (ref 12.0–15.0)
MCH: 31.7 pg (ref 26.0–34.0)
MCHC: 32.3 g/dL (ref 30.0–36.0)
MCV: 98.3 fL (ref 80.0–100.0)
Platelets: 321 10*3/uL (ref 150–400)
RBC: 4.13 MIL/uL (ref 3.87–5.11)
RDW: 11.9 % (ref 11.5–15.5)
WBC: 22.4 10*3/uL — ABNORMAL HIGH (ref 4.0–10.5)
nRBC: 0 % (ref 0.0–0.2)

## 2018-09-26 LAB — URINE DRUG SCREEN, QUALITATIVE (ARMC ONLY)
Amphetamines, Ur Screen: NOT DETECTED
Barbiturates, Ur Screen: NOT DETECTED
Benzodiazepine, Ur Scrn: NOT DETECTED
Cannabinoid 50 Ng, Ur ~~LOC~~: POSITIVE — AB
Cocaine Metabolite,Ur ~~LOC~~: NOT DETECTED
MDMA (Ecstasy)Ur Screen: NOT DETECTED
Methadone Scn, Ur: NOT DETECTED
Opiate, Ur Screen: NOT DETECTED
Phencyclidine (PCP) Ur S: NOT DETECTED
Tricyclic, Ur Screen: POSITIVE — AB

## 2018-09-26 LAB — CREATININE, SERUM
Creatinine, Ser: 0.97 mg/dL (ref 0.44–1.00)
GFR calc Af Amer: >60 mL/min (ref >60)
GFR calc non Af Amer: >60 mL/min (ref >60)

## 2018-09-26 LAB — POCT PREGNANCY, URINE: Preg Test, Ur: NEGATIVE

## 2018-09-26 SURGERY — ROBOTIC ASSISTED LAPAROSCOPIC NISSEN FUNDOPLICATION
Anesthesia: General

## 2018-09-26 MED ORDER — PROPOFOL 10 MG/ML IV BOLUS
INTRAVENOUS | Status: DC | PRN
  Administered 2018-09-26: 150 mg via INTRAVENOUS

## 2018-09-26 MED ORDER — ONDANSETRON HCL 4 MG/2ML IJ SOLN
4.0000 mg | Freq: Once | INTRAMUSCULAR | Status: AC | PRN
  Administered 2018-09-26: 4 mg via INTRAVENOUS

## 2018-09-26 MED ORDER — LACTATED RINGERS IV SOLN
INTRAVENOUS | Status: DC | PRN
  Administered 2018-09-26 (×2): via INTRAVENOUS

## 2018-09-26 MED ORDER — DEXAMETHASONE SODIUM PHOSPHATE 4 MG/ML IJ SOLN
INTRAMUSCULAR | Status: AC
  Filled 2018-09-26: qty 1

## 2018-09-26 MED ORDER — ONDANSETRON HCL 4 MG/2ML IJ SOLN
INTRAMUSCULAR | Status: AC
  Filled 2018-09-26: qty 2

## 2018-09-26 MED ORDER — BUPIVACAINE-EPINEPHRINE 0.25% -1:200000 IJ SOLN
INTRAMUSCULAR | Status: DC | PRN
  Administered 2018-09-26: 30 mL

## 2018-09-26 MED ORDER — GABAPENTIN 300 MG PO CAPS: 300.0000 mg | ORAL_CAPSULE | ORAL | Status: DC

## 2018-09-26 MED ORDER — FENTANYL CITRATE (PF) 100 MCG/2ML IJ SOLN
INTRAMUSCULAR | Status: DC | PRN
  Administered 2018-09-26 (×2): 50 ug via INTRAVENOUS
  Administered 2018-09-26: 100 ug via INTRAVENOUS
  Administered 2018-09-26: 50 ug via INTRAVENOUS

## 2018-09-26 MED ORDER — SUGAMMADEX SODIUM 200 MG/2ML IV SOLN
INTRAVENOUS | Status: DC | PRN
  Administered 2018-09-26: 200 mg via INTRAVENOUS

## 2018-09-26 MED ORDER — PROMETHAZINE HCL 25 MG PO TABS
25.0000 mg | ORAL_TABLET | Freq: Four times a day (QID) | ORAL | Status: DC | PRN
  Administered 2018-09-27 (×2): 25 mg via ORAL
  Filled 2018-09-26 (×2): qty 1

## 2018-09-26 MED ORDER — HYDROMORPHONE HCL 1 MG/ML IJ SOLN
INTRAMUSCULAR | Status: DC | PRN
  Administered 2018-09-26 (×2): .2 mg via INTRAVENOUS

## 2018-09-26 MED ORDER — TRAZODONE HCL 100 MG PO TABS
100.0000 mg | ORAL_TABLET | Freq: Every day | ORAL | Status: DC
  Administered 2018-09-26: 100 mg via ORAL
  Filled 2018-09-26: qty 1

## 2018-09-26 MED ORDER — HEPARIN SODIUM (PORCINE) 5000 UNIT/ML IJ SOLN
INTRAMUSCULAR | Status: AC
  Filled 2018-09-26: qty 1

## 2018-09-26 MED ORDER — HEPARIN SODIUM (PORCINE) 5000 UNIT/ML IJ SOLN
5000.0000 [IU] | Freq: Once | INTRAMUSCULAR | Status: AC
  Administered 2018-09-26: 5000 [IU] via SUBCUTANEOUS

## 2018-09-26 MED ORDER — LIDOCAINE HCL (CARDIAC) PF 100 MG/5ML IV SOSY
PREFILLED_SYRINGE | INTRAVENOUS | Status: DC | PRN
  Administered 2018-09-26: 60 mg via INTRAVENOUS

## 2018-09-26 MED ORDER — GABAPENTIN 300 MG PO CAPS
900.0000 mg | ORAL_CAPSULE | Freq: Two times a day (BID) | ORAL | Status: DC
  Administered 2018-09-26 – 2018-09-27 (×2): 900 mg via ORAL
  Filled 2018-09-26 (×2): qty 3

## 2018-09-26 MED ORDER — CYCLOBENZAPRINE HCL 10 MG PO TABS: 10.0000 mg | ORAL_TABLET | Freq: Three times a day (TID) | ORAL | Status: DC | PRN

## 2018-09-26 MED ORDER — ACETAMINOPHEN 10 MG/ML IV SOLN
INTRAVENOUS | Status: AC
  Filled 2018-09-26: qty 100

## 2018-09-26 MED ORDER — CELECOXIB 200 MG PO CAPS
200.0000 mg | ORAL_CAPSULE | ORAL | Status: AC
  Administered 2018-09-26: 200 mg via ORAL

## 2018-09-26 MED ORDER — ESMOLOL HCL 100 MG/10ML IV SOLN
INTRAVENOUS | Status: AC
  Filled 2018-09-26: qty 10

## 2018-09-26 MED ORDER — MIDAZOLAM HCL 2 MG/2ML IJ SOLN
INTRAMUSCULAR | Status: AC
  Filled 2018-09-26: qty 2

## 2018-09-26 MED ORDER — ROCURONIUM BROMIDE 100 MG/10ML IV SOLN
INTRAVENOUS | Status: DC | PRN
  Administered 2018-09-26: 50 mg via INTRAVENOUS
  Administered 2018-09-26: 30 mg via INTRAVENOUS
  Administered 2018-09-26: 20 mg via INTRAVENOUS

## 2018-09-26 MED ORDER — GUANFACINE HCL ER 1 MG PO TB24
1.0000 mg | ORAL_TABLET | ORAL | Status: DC
  Administered 2018-09-27: 1 mg via ORAL
  Filled 2018-09-26 (×2): qty 1

## 2018-09-26 MED ORDER — ONDANSETRON HCL 4 MG/2ML IJ SOLN
INTRAMUSCULAR | Status: DC | PRN
  Administered 2018-09-26: 4 mg via INTRAVENOUS

## 2018-09-26 MED ORDER — DEXAMETHASONE SODIUM PHOSPHATE 10 MG/ML IJ SOLN
INTRAMUSCULAR | Status: DC | PRN
  Administered 2018-09-26: 4 mg via INTRAVENOUS

## 2018-09-26 MED ORDER — PROPOFOL 10 MG/ML IV BOLUS
INTRAVENOUS | Status: AC
  Filled 2018-09-26: qty 40

## 2018-09-26 MED ORDER — CARIPRAZINE HCL 1.5 MG PO CAPS
1.5000 mg | ORAL_CAPSULE | Freq: Every day | ORAL | Status: DC
  Administered 2018-09-27: 1.5 mg via ORAL
  Filled 2018-09-26 (×2): qty 1

## 2018-09-26 MED ORDER — FENTANYL CITRATE (PF) 100 MCG/2ML IJ SOLN
25.0000 ug | INTRAMUSCULAR | Status: DC | PRN
  Administered 2018-09-26 (×3): 25 ug via INTRAVENOUS
  Administered 2018-09-26: 50 ug via INTRAVENOUS
  Administered 2018-09-26: 25 ug via INTRAVENOUS

## 2018-09-26 MED ORDER — METHOCARBAMOL 500 MG PO TABS
500.0000 mg | ORAL_TABLET | Freq: Four times a day (QID) | ORAL | Status: DC | PRN
  Filled 2018-09-26: qty 1

## 2018-09-26 MED ORDER — BUPIVACAINE LIPOSOME 1.3 % IJ SUSP
INTRAMUSCULAR | Status: DC | PRN
  Administered 2018-09-26: 20 mL

## 2018-09-26 MED ORDER — ACETAMINOPHEN 10 MG/ML IV SOLN
INTRAVENOUS | Status: DC | PRN
  Administered 2018-09-26: 1000 mg via INTRAVENOUS

## 2018-09-26 MED ORDER — ACETAMINOPHEN 500 MG PO TABS
1000.0000 mg | ORAL_TABLET | Freq: Four times a day (QID) | ORAL | Status: DC
  Administered 2018-09-26 – 2018-09-27 (×3): 1000 mg via ORAL
  Filled 2018-09-26 (×3): qty 2

## 2018-09-26 MED ORDER — BUPIVACAINE-EPINEPHRINE (PF) 0.25% -1:200000 IJ SOLN
INTRAMUSCULAR | Status: AC
  Filled 2018-09-26: qty 30

## 2018-09-26 MED ORDER — PROCHLORPERAZINE MALEATE 10 MG PO TABS: 10.0000 mg | ORAL_TABLET | Freq: Four times a day (QID) | ORAL | Status: DC | PRN

## 2018-09-26 MED ORDER — MIDAZOLAM HCL 2 MG/2ML IJ SOLN
INTRAMUSCULAR | Status: DC | PRN
  Administered 2018-09-26: 2 mg via INTRAVENOUS

## 2018-09-26 MED ORDER — HYDRALAZINE HCL 20 MG/ML IJ SOLN
10.0000 mg | INTRAMUSCULAR | Status: DC | PRN
  Administered 2018-09-26: 10 mg via INTRAVENOUS
  Filled 2018-09-26: qty 1

## 2018-09-26 MED ORDER — SUGAMMADEX SODIUM 200 MG/2ML IV SOLN
INTRAVENOUS | Status: AC
  Filled 2018-09-26: qty 2

## 2018-09-26 MED ORDER — MORPHINE SULFATE (PF) 2 MG/ML IV SOLN
2.0000 mg | INTRAVENOUS | Status: DC | PRN
  Administered 2018-09-26 – 2018-09-27 (×5): 2 mg via INTRAVENOUS
  Filled 2018-09-26 (×5): qty 1

## 2018-09-26 MED ORDER — FENTANYL CITRATE (PF) 100 MCG/2ML IJ SOLN
INTRAMUSCULAR | Status: AC
  Administered 2018-09-26: 25 ug via INTRAVENOUS
  Filled 2018-09-26: qty 2

## 2018-09-26 MED ORDER — ESMOLOL HCL 100 MG/10ML IV SOLN
INTRAVENOUS | Status: DC | PRN
  Administered 2018-09-26: 10 mg via INTRAVENOUS
  Administered 2018-09-26 (×2): 20 mg via INTRAVENOUS

## 2018-09-26 MED ORDER — DIPHENHYDRAMINE HCL 50 MG/ML IJ SOLN: 12.5000 mg | Freq: Four times a day (QID) | INTRAMUSCULAR | Status: DC | PRN

## 2018-09-26 MED ORDER — BUPIVACAINE LIPOSOME 1.3 % IJ SUSP
INTRAMUSCULAR | Status: AC
  Filled 2018-09-26: qty 20

## 2018-09-26 MED ORDER — CEFAZOLIN SODIUM-DEXTROSE 2-4 GM/100ML-% IV SOLN
INTRAVENOUS | Status: AC
  Filled 2018-09-26: qty 100

## 2018-09-26 MED ORDER — ACETAMINOPHEN 500 MG PO TABS
1000.0000 mg | ORAL_TABLET | ORAL | Status: AC
  Administered 2018-09-26: 1000 mg via ORAL

## 2018-09-26 MED ORDER — DIPHENHYDRAMINE HCL 12.5 MG/5ML PO ELIX
12.5000 mg | ORAL_SOLUTION | Freq: Four times a day (QID) | ORAL | Status: DC | PRN
  Filled 2018-09-26: qty 5

## 2018-09-26 MED ORDER — ENOXAPARIN SODIUM 40 MG/0.4ML ~~LOC~~ SOLN
40.0000 mg | SUBCUTANEOUS | Status: DC
  Administered 2018-09-27: 40 mg via SUBCUTANEOUS
  Filled 2018-09-26: qty 0.4

## 2018-09-26 MED ORDER — LABETALOL HCL 5 MG/ML IV SOLN
INTRAVENOUS | Status: DC | PRN
  Administered 2018-09-26: 2.5 mg via INTRAVENOUS

## 2018-09-26 MED ORDER — LABETALOL HCL 5 MG/ML IV SOLN
INTRAVENOUS | Status: AC
  Filled 2018-09-26: qty 4

## 2018-09-26 MED ORDER — FENTANYL CITRATE (PF) 100 MCG/2ML IJ SOLN
INTRAMUSCULAR | Status: AC
  Filled 2018-09-26: qty 2

## 2018-09-26 MED ORDER — PROPRANOLOL HCL 10 MG PO TABS
10.0000 mg | ORAL_TABLET | Freq: Two times a day (BID) | ORAL | Status: DC
  Administered 2018-09-26 – 2018-09-27 (×2): 10 mg via ORAL
  Filled 2018-09-26 (×4): qty 1

## 2018-09-26 MED ORDER — TOPIRAMATE 25 MG PO TABS
50.0000 mg | ORAL_TABLET | Freq: Two times a day (BID) | ORAL | Status: DC
  Administered 2018-09-26 – 2018-09-27 (×2): 50 mg via ORAL
  Filled 2018-09-26 (×4): qty 2

## 2018-09-26 MED ORDER — LACTATED RINGERS IV SOLN: INTRAVENOUS | Status: DC

## 2018-09-26 MED ORDER — CHLORHEXIDINE GLUCONATE CLOTH 2 % EX PADS: 6.0000 | MEDICATED_PAD | Freq: Once | CUTANEOUS | Status: DC

## 2018-09-26 MED ORDER — CELECOXIB 200 MG PO CAPS
ORAL_CAPSULE | ORAL | Status: AC
  Filled 2018-09-26: qty 1

## 2018-09-26 MED ORDER — ACETAMINOPHEN 500 MG PO TABS
ORAL_TABLET | ORAL | Status: AC
  Filled 2018-09-26: qty 2

## 2018-09-26 MED ORDER — HYDROMORPHONE HCL 1 MG/ML IJ SOLN
INTRAMUSCULAR | Status: AC
  Filled 2018-09-26: qty 1

## 2018-09-26 MED ORDER — ONDANSETRON 4 MG PO TBDP: 4.0000 mg | ORAL_TABLET | Freq: Four times a day (QID) | ORAL | Status: DC | PRN

## 2018-09-26 MED ORDER — MELATONIN 5 MG PO TABS
5.0000 mg | ORAL_TABLET | Freq: Every day | ORAL | Status: DC
  Administered 2018-09-26: 5 mg via ORAL
  Filled 2018-09-26 (×3): qty 1

## 2018-09-26 MED ORDER — PROCHLORPERAZINE EDISYLATE 10 MG/2ML IJ SOLN: 5.0000 mg | Freq: Four times a day (QID) | INTRAMUSCULAR | Status: DC | PRN

## 2018-09-26 MED ORDER — ONDANSETRON HCL 4 MG/2ML IJ SOLN
4.0000 mg | Freq: Four times a day (QID) | INTRAMUSCULAR | Status: DC | PRN
  Administered 2018-09-26 – 2018-09-27 (×2): 4 mg via INTRAVENOUS
  Filled 2018-09-26 (×2): qty 2

## 2018-09-26 MED ORDER — FENTANYL CITRATE (PF) 250 MCG/5ML IJ SOLN
INTRAMUSCULAR | Status: AC
  Filled 2018-09-26: qty 5

## 2018-09-26 MED ORDER — LACTATED RINGERS IV SOLN
INTRAVENOUS | Status: DC
  Administered 2018-09-26: 18:00:00 via INTRAVENOUS

## 2018-09-26 MED ORDER — KETOROLAC TROMETHAMINE 30 MG/ML IJ SOLN
30.0000 mg | Freq: Four times a day (QID) | INTRAMUSCULAR | Status: DC
  Administered 2018-09-26 – 2018-09-27 (×3): 30 mg via INTRAVENOUS
  Filled 2018-09-26 (×3): qty 1

## 2018-09-26 SURGICAL SUPPLY — 44 items
APL PRP STRL LF DISP 70% ISPRP (MISCELLANEOUS) ×1
CANISTER SUCT 1200ML W/VALVE (MISCELLANEOUS) ×3 IMPLANT
CHLORAPREP W/TINT 26 (MISCELLANEOUS) ×3 IMPLANT
CORD BIP STRL DISP 12FT (MISCELLANEOUS) ×1 IMPLANT
COVER WAND RF STERILE (DRAPES) ×3 IMPLANT
DEFOGGER SCOPE WARMER CLEARIFY (MISCELLANEOUS) ×3 IMPLANT
DRAPE ARM DVNC X/XI (DISPOSABLE) ×1 IMPLANT
DRAPE COLUMN DVNC XI (DISPOSABLE) IMPLANT
DRAPE DA VINCI XI ARM (DISPOSABLE) ×8
DRAPE DA VINCI XI COLUMN (DISPOSABLE) ×2
DRAPE SHEET LG 3/4 BI-LAMINATE (DRAPES) ×3 IMPLANT
ELECT CAUTERY BLADE 6.4 (BLADE) ×5 IMPLANT
ELECT REM PT RETURN 9FT ADLT (ELECTROSURGICAL) ×3
ELECTRODE REM PT RTRN 9FT ADLT (ELECTROSURGICAL) ×1 IMPLANT
GLOVE BIO SURGEON STRL SZ7 (GLOVE) ×11 IMPLANT
GOWN STRL REUS W/ TWL LRG LVL3 (GOWN DISPOSABLE) ×3 IMPLANT
GOWN STRL REUS W/TWL LRG LVL3 (GOWN DISPOSABLE) ×15
IV NS 1000ML (IV SOLUTION) ×3
IV NS 1000ML BAXH (IV SOLUTION) ×1 IMPLANT
KIT PINK PAD W/HEAD ARE REST (MISCELLANEOUS) ×3
KIT PINK PAD W/HEAD ARM REST (MISCELLANEOUS) ×1 IMPLANT
LABEL OR SOLS (LABEL) ×3 IMPLANT
NEEDLE HYPO 22GX1.5 SAFETY (NEEDLE) ×3 IMPLANT
OBTURATOR OPTICAL STANDARD 8MM (TROCAR)
OBTURATOR OPTICAL STND 8 DVNC (TROCAR)
OBTURATOR OPTICALSTD 8 DVNC (TROCAR) ×1 IMPLANT
PACK LAP CHOLECYSTECTOMY (MISCELLANEOUS) ×3 IMPLANT
PENCIL ELECTRO HAND CTR (MISCELLANEOUS) ×3 IMPLANT
SEAL CANN UNIV 5-8 DVNC XI (MISCELLANEOUS) IMPLANT
SEAL XI 5MM-8MM UNIVERSAL (MISCELLANEOUS) ×8
SEALER ENDOWRIST ONE VESSEL (MISCELLANEOUS) ×3 IMPLANT
SOLUTION ELECTROLUBE (MISCELLANEOUS) ×3 IMPLANT
SPONGE LAP 18X18 RF (DISPOSABLE) ×3 IMPLANT
SPONGE LAP 4X18 RFD (DISPOSABLE) ×3 IMPLANT
STRAP SAFETY 5IN WIDE (MISCELLANEOUS) ×1 IMPLANT
SUT MNCRL 4-0 (SUTURE) ×3
SUT MNCRL 4-0 27XMFL (SUTURE) ×1
SUT SILK 2 0 SH (SUTURE) ×12 IMPLANT
SUT VICRYL 0 AB UR-6 (SUTURE) ×6 IMPLANT
SUT VLOC 90 S/L VL9 GS22 (SUTURE) ×3 IMPLANT
SUTURE MNCRL 4-0 27XMF (SUTURE) ×1 IMPLANT
TROCAR BALLN GELPORT 12X130M (ENDOMECHANICALS) ×1 IMPLANT
TROCAR XCEL NON-BLD 5MMX100MML (ENDOMECHANICALS) ×3 IMPLANT
TUBING EVAC SMOKE HEATED PNEUM (TUBING) ×3 IMPLANT

## 2018-09-26 NOTE — Anesthesia Preprocedure Evaluation (Signed)
Anesthesia Evaluation  Patient identified by MRN, date of birth, ID band Patient awake    Reviewed: Allergy & Precautions, NPO status , Patient's Chart, lab work & pertinent test results, reviewed documented beta blocker date and time   Airway Mallampati: III  TM Distance: >3 FB     Dental  (+) Chipped   Pulmonary asthma , Current Smoker,           Cardiovascular      Neuro/Psych PSYCHIATRIC DISORDERS Anxiety Depression Bipolar Disorder    GI/Hepatic PUD, GERD  Controlled,  Endo/Other    Renal/GU      Musculoskeletal   Abdominal   Peds  Hematology   Anesthesia Other Findings Obese.  Reproductive/Obstetrics                             Anesthesia Physical Anesthesia Plan  ASA: III  Anesthesia Plan: General   Post-op Pain Management:    Induction: Intravenous  PONV Risk Score and Plan:   Airway Management Planned: Oral ETT  Additional Equipment:   Intra-op Plan:   Post-operative Plan:   Informed Consent: I have reviewed the patients History and Physical, chart, labs and discussed the procedure including the risks, benefits and alternatives for the proposed anesthesia with the patient or authorized representative who has indicated his/her understanding and acceptance.       Plan Discussed with: CRNA  Anesthesia Plan Comments:         Anesthesia Quick Evaluation

## 2018-09-26 NOTE — Op Note (Signed)
Robotic assisted laparoscopic Nissen fundoplication w repair of  hiatal hernia  Pre-operative Diagnosis: GERD, hiatal hernia  Post-operative Diagnosis: same  Procedure:  Robotic assisted laparoscopic Nissen fundoplication w repair of  hiatal hernia  Surgeon: Caroleen Hamman, MD FACS  Assistant: Dr. Genevive Bi. Required due to the complexity of the case the need for exposure and lack of first assist.  Anesthesia: Gen. with endotracheal tube  Findings: Sliding hiatal hernia Loose wrap 360 degree over 48 FR Bougie   Estimated Blood Loss: 15cc       Specimens: Gallbladder           Complications: none   Procedure Details  The patient was seen again in the Holding Room. The benefits, complications, treatment options, and expected outcomes were discussed with the patient. The risks of bleeding, infection, recurrence of symptoms, failure to resolve symptoms,  esophageal damage, Dysphagia, bowel injury, any of which could require further surgery were reviewed with the patient. The likelihood of improving the patient's symptoms with return to their baseline status is good.  The patient and/or family concurred with the proposed plan, giving informed consent.  The patient was taken to Operating Room, identified  and the procedure verified.  A Time Out was held and the above information confirmed.  Prior to the induction of general anesthesia, antibiotic prophylaxis was administered. VTE prophylaxis was in place. General endotracheal anesthesia was then administered and tolerated well. After the induction, the abdomen was prepped with Chloraprep and draped in the sterile fashion. The patient was positioned in the supine position.  Cut down technique was used to enter the abdominal cavity and a Hasson trochar was placed after two vicryl stitches were anchored to the fascia. Pneumoperitoneum was then created with CO2 and tolerated well without any adverse changes in the patient's vital signs.  Three 8-mm  ports were placed under direct vision. All skin incisions  were infiltrated with a local anesthetic agent before making the incision and placing the trocars. An additional 5 mm regular laparoscopic port was placed to assist with retraction and exposure.   The patient was positioned  in reverse Trendelenburg, robot was brought to the surgical field and docked in the standard fashion.  We made sure all the instrumentation was kept indirect view at all times and that there were no collision between the arms. I scrubbed out and went to the console.  I used a attic arm to retract the liver, the vessel sealer on my right hand and a forced bipolar grasper on my left hand.  There is along the extra 5 mm port allow me ample exposure and the ability to perform meticulous dissection  We Started dividing the lesser omentum via the pars flaccida.  We Were able to dissect the lesser curvature of the stomach and  dissected the fundus free from the right and left crus.  We circumferentially dissected the GE junction.  The hernia sac was also completely reduced and we were able to bring the stomach into the intra-abdominal position.  Attention then was turned to the greater curvature where the short gastrics were divided with sealer device.  We were able to identify the left crus and again were able to make sure there was a good circumferential dissection and that the hernia sac was completely excised.  We did perform also some dissection within the mediastinum to allow a complete reduction of the sac and a to completely allow an intra-abdominal Nissen fundoplication.  2-0V lock suture was inserted and the crus  as well as the hernia was closed with a running suture  We Asked anesthesia to place a 48 French bougie and this went easily.  We also observe trajectory of the bougie. 360 degree Nissen fundoplication was created with multiple 2-0 silk sutures and we placed 3 stitches taking some of the esophagus within that  bite.  The fundoplication measured approximately 3-1/2 cm and he was floppy. I was very happy with the way the fundoplication laid and the repair of the hernia.  Is a small traction laceration within the liver that measure about 2 mm and we were able to cauterize it appropriately.   Inspection of the  upper quadrant was performed. No bleeding, bile  Or esophageal injuries leaks, or bowel injuries were noted. Robotic instruments and robotic arms were undocked in the standard fashion. All the needles were removed under direct visualization.   I scrubbed back in.  Pneumoperitoneum was released.  The periumbilical port site was closed with interrumpted 0 Vicryl sutures. 4-0 subcuticular Monocryl was used to close the skin. Liposomal marcaine was injected to all the incisions sites.  Dermabond was  applied.  The patient was then extubated and brought to the recovery room in stable condition. Sponge, lap, and needle counts were correct at closure and at the conclusion of the case.               Caroleen Hamman, MD, FACS

## 2018-09-26 NOTE — Transfer of Care (Signed)
Immediate Anesthesia Transfer of Care Note  Patient: Victoria Pierce  Procedure(s) Performed: ROBOTIC ASSISTED LAPAROSCOPIC NISSEN FUNDOPLICATION (N/A )  Patient Location: PACU  Anesthesia Type:General  Level of Consciousness: awake and drowsy  Airway & Oxygen Therapy: Patient Spontanous Breathing and Patient connected to face mask oxygen  Post-op Assessment: Report given to RN and Post -op Vital signs reviewed and stable  Post vital signs: Reviewed and stable  Last Vitals:  Vitals Value Taken Time  BP 150/94 09/26/18 1216  Temp 36.9 C 09/26/18 1216  Pulse 116 09/26/18 1217  Resp 19 09/26/18 1217  SpO2 98 % 09/26/18 1217  Vitals shown include unvalidated device data.  Last Pain:  Vitals:   09/26/18 0816  TempSrc: Tympanic  PainSc: 0-No pain         Complications: No apparent anesthesia complications

## 2018-09-26 NOTE — Anesthesia Procedure Notes (Signed)
Procedure Name: Intubation Date/Time: 09/26/2018 10:00 AM Performed by: Gentry Fitz, CRNA Pre-anesthesia Checklist: Patient identified, Suction available, Emergency Drugs available and Patient being monitored Patient Re-evaluated:Patient Re-evaluated prior to induction Oxygen Delivery Method: Circle system utilized Preoxygenation: Pre-oxygenation with 100% oxygen Induction Type: IV induction and Cricoid Pressure applied Ventilation: Mask ventilation without difficulty Laryngoscope Size: Mac and 4 Grade View: Grade II Tube type: Oral Tube size: 7.0 mm Number of attempts: 1 Airway Equipment and Method: Stylet Placement Confirmation: ETT inserted through vocal cords under direct vision,  positive ETCO2 and breath sounds checked- equal and bilateral Secured at: 22 cm Tube secured with: Tape Dental Injury: Teeth and Oropharynx as per pre-operative assessment

## 2018-09-26 NOTE — Anesthesia Post-op Follow-up Note (Signed)
Anesthesia QCDR form completed.        

## 2018-09-26 NOTE — Interval H&P Note (Signed)
History and Physical Interval Note:  09/26/2018 9:20 AM  Victoria Pierce  has presented today for surgery, with the diagnosis of HIATAL HERNIA.  The various methods of treatment have been discussed with the patient and family. After consideration of risks, benefits and other options for treatment, the patient has consented to  Procedure(s): ROBOTIC ASSISTED LAPAROSCOPIC NISSEN FUNDOPLICATION (N/A) as a surgical intervention.  The patient's history has been reviewed, patient examined, no change in status, stable for surgery.  I have reviewed the patient's chart and labs.  Questions were answered to the patient's satisfaction.     [place]

## 2018-09-27 ENCOUNTER — Encounter: Payer: Self-pay | Admitting: Surgery

## 2018-09-27 ENCOUNTER — Telehealth: Payer: Self-pay | Admitting: *Deleted

## 2018-09-27 DIAGNOSIS — K449 Diaphragmatic hernia without obstruction or gangrene: Secondary | ICD-10-CM | POA: Diagnosis not present

## 2018-09-27 LAB — CBC
HCT: 38.2 % (ref 36.0–46.0)
Hemoglobin: 12.6 g/dL (ref 12.0–15.0)
MCH: 31.7 pg (ref 26.0–34.0)
MCHC: 33 g/dL (ref 30.0–36.0)
MCV: 96 fL (ref 80.0–100.0)
Platelets: 337 10*3/uL (ref 150–400)
RBC: 3.98 MIL/uL (ref 3.87–5.11)
RDW: 11.9 % (ref 11.5–15.5)
WBC: 17.4 10*3/uL — ABNORMAL HIGH (ref 4.0–10.5)
nRBC: 0 % (ref 0.0–0.2)

## 2018-09-27 LAB — BASIC METABOLIC PANEL
Anion gap: 9 (ref 5–15)
BUN: 7 mg/dL (ref 6–20)
CO2: 18 mmol/L — ABNORMAL LOW (ref 22–32)
Calcium: 8.8 mg/dL — ABNORMAL LOW (ref 8.9–10.3)
Chloride: 111 mmol/L (ref 98–111)
Creatinine, Ser: 0.74 mg/dL (ref 0.44–1.00)
GFR calc Af Amer: >60 mL/min (ref >60)
GFR calc non Af Amer: >60 mL/min (ref >60)
Glucose, Bld: 109 mg/dL — ABNORMAL HIGH (ref 70–99)
Potassium: 3.5 mmol/L (ref 3.5–5.1)
Sodium: 138 mmol/L (ref 135–145)

## 2018-09-27 LAB — HIV ANTIBODY (ROUTINE TESTING W REFLEX): HIV Screen 4th Generation wRfx: NONREACTIVE

## 2018-09-27 MED ORDER — OXYCODONE HCL 5 MG PO TABS
5.0000 mg | ORAL_TABLET | ORAL | Status: DC | PRN
  Administered 2018-09-27: 5 mg via ORAL
  Filled 2018-09-27: qty 1

## 2018-09-27 MED ORDER — OXYCODONE HCL 5 MG PO TABS: 5.0000 mg | ORAL_TABLET | Freq: Four times a day (QID) | ORAL | 0 refills | Status: DC | PRN

## 2018-09-27 MED ORDER — OXYCODONE HCL 5 MG PO TABS: 5.0000 mg | ORAL_TABLET | ORAL | Status: DC | PRN

## 2018-09-27 MED ORDER — ONDANSETRON 4 MG PO TBDP: 4.0000 mg | ORAL_TABLET | Freq: Four times a day (QID) | ORAL | 0 refills | Status: AC | PRN

## 2018-09-27 NOTE — Discharge Summary (Signed)
[facility] SURGICAL DISCHARGE SUMMARY  Patient ID: Victoria Pierce MRN: [id number] DOB/AGE: [birth year] 38 y.o.  Admit date: 09/26/2018 Discharge date: 09/27/2018  Discharge Diagnoses Patient Active Problem List   Diagnosis Date Noted  . S/P Nissen fundoplication (without gastrostomy tube) procedure 09/26/2018  . Chronic pelvic pain in female 06/14/2018  . Dysmenorrhea 06/14/2018  . Menorrhagia with regular cycle 06/14/2018  . Stiffness of finger joint, 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  . Closed displaced fracture of neck of fifth metacarpal bone of left hand 03/07/2018  . Ulcerative colitis   . GERD (gastroesophageal reflux disease)   . Anxiety     Consultants None  Procedures 09/26/2018:  Robotic assisted laparoscopic Nissen fundoplication w repair of  hiatal hernia  HPI: Victoria Pierce is a 38 y.o. female with a history of symptomatic hiatal hernia who presents to [facility] on 06/11 for hiatal hernia repaor with Dr Dahlia Byes.   Hospital Course: Informed consent was obtained and documented, and patient underwent uneventful Robotic assisted laparoscopic Nissen fundoplication w repair of hiatal hernia (Dr Dahlia Byes, 09/26/2018).  Post-operatively, patient's pain/nausea/GERD symptoms improved/resolved and advancement of patient's diet and ambulation were well-tolerated. The remainder of patient's hospital course was essentially unremarkable, and discharge planning was initiated accordingly with patient safely able to be discharged home with appropriate discharge instructions, pain control, and outpatient follow-up after all of her questions were answered to her expressed satisfaction.   Discharge Condition: Good   Physical Examination:  Constitutional: Well appearing female, NAD Pulmonary: Normal effort, no respiratory distress Gastrointestinal: Obese, soft, incisional tenderness, non-distended, no rebound/guarding Skin: Laparoscopic incisions are CDI with dermabond, there is  surrounding ecchymosis, no erythema or drainage.    Allergies as of 09/27/2018   No Known Allergies     Medication List    TAKE these medications   albuterol 108 (90 Base) MCG/ACT inhaler Commonly known as: VENTOLIN HFA Inhale 2 puffs into the lungs every 6 (six) hours as needed for wheezing or shortness of breath.   cetirizine 10 MG tablet Commonly known as: ZYRTEC Take 10 mg by mouth every morning.   [facility] Eye Itch Relief 0.025 % ophthalmic solution Generic drug: ketotifen Place 2 drops into both eyes 2 (two) times daily.   cyclobenzaprine 10 MG tablet Commonly known as: FLEXERIL Take 10 mg by mouth 3 (three) times daily as needed for muscle spasms.   gabapentin 300 MG capsule Commonly known as: NEURONTIN Take 900 mg by mouth 2 (two) times daily.   hydroxypropyl methylcellulose / hypromellose 2.5 % ophthalmic solution Commonly known as: ISOPTO TEARS / GONIOVISC Place 2 drops into both eyes 2 (two) times daily as needed for dry eyes. ARTIFICAL TEARS   Intuniv 1 MG Tb24 ER tablet Generic drug: guanFACINE Take 1 mg by mouth every morning.   Melatonin 5 MG Caps Take 5 mg by mouth at bedtime.   multivitamin with minerals Tabs tablet Take 1 tablet by mouth daily.   naproxen 500 MG tablet Commonly known as: NAPROSYN Take 500 mg by mouth 2 (two) times daily as needed for moderate pain.   ondansetron 4 MG disintegrating tablet Commonly known as: ZOFRAN-ODT Take 1 tablet (4 mg total) by mouth every 6 (six) hours as needed for nausea or vomiting.   oxyCODONE 5 MG immediate release tablet Commonly known as: Oxy IR/ROXICODONE Take 1-2 tablets (5-10 mg total) by mouth every 6 (six) hours as needed for severe pain or breakthrough pain.   pantoprazole 40 MG tablet Commonly known as: Protonix Take 1 tablet (  40 mg total) by mouth 2 (two) times daily before a meal.   PROBIOTIC DAILY PO Take 1 capsule by mouth daily.   promethazine 25 MG tablet Commonly known as:  PHENERGAN Take 25 mg by mouth every 6 (six) hours as needed for nausea or vomiting.   propranolol 10 MG tablet Commonly known as: INDERAL Take 10 mg by mouth 2 (two) times daily.   topiramate 50 MG tablet Commonly known as: TOPAMAX Take 50 mg by mouth 2 (two) times daily.   traZODone 100 MG tablet Commonly known as: DESYREL Take 100 mg by mouth at bedtime.   vitamin B-12 1000 MCG tablet Commonly known as: CYANOCOBALAMIN Take 1,000 mcg by mouth daily.   Vraylar capsule Generic drug: cariprazine Take 1.5 mg by mouth 2 (two) times daily.        Follow-up Information    Pabon, [place] F, MD. Schedule an appointment as soon as possible for a visit in 2 week(s).   Specialty: General Surgery Why: s/p nissen fundoplication....needs 2 week follow up Contact information: [address] [place] [address] [phone]            Time spent on discharge management including discussion of hospital course, clinical condition, outpatient instructions, prescriptions, and follow up with the patient and members of the medical team: >30 minutes  -- Edison Simon , PA-C [facility]  09/27/2018, 10:44 AM [phone] M-F: 7am - 4pm

## 2018-09-27 NOTE — Anesthesia Postprocedure Evaluation (Signed)
Anesthesia Post Note  Patient: Victoria Pierce  Procedure(s) Performed: ROBOTIC ASSISTED LAPAROSCOPIC NISSEN FUNDOPLICATION (N/A )  Patient location during evaluation: PACU Anesthesia Type: General Level of consciousness: awake and alert Pain management: pain level controlled Vital Signs Assessment: post-procedure vital signs reviewed and stable Respiratory status: spontaneous breathing, nonlabored ventilation, respiratory function stable and patient connected to nasal cannula oxygen Cardiovascular status: blood pressure returned to baseline and stable Postop Assessment: no apparent nausea or vomiting Anesthetic complications: no     Last Vitals:  Vitals:   09/27/18 0107 09/27/18 0513  BP: (!) 153/101 (!) 134/91  Pulse: 99 (!) 108  Resp:  18  Temp:  36.8 C  SpO2: 98% 98%    Last Pain:  Vitals:   09/27/18 0924  TempSrc:   PainSc: [place]

## 2018-09-27 NOTE — Telephone Encounter (Signed)
Patient states that  Dr. Dahlia Byes need to call [facility] [phone].  [facility] will be [facility] DR. Pabon

## 2018-10-02 ENCOUNTER — Telehealth: Payer: Self-pay | Admitting: *Deleted

## 2018-10-02 NOTE — Telephone Encounter (Signed)
I called and she would like just a few to get her though the weekend, her appointment is for Monday.

## 2018-10-02 NOTE — Telephone Encounter (Signed)
Patient called and wants to see if she can get another refill on oxycodone. Please call and advise

## 2018-10-02 NOTE — Telephone Encounter (Signed)
My understanding is that she has a narcotic contract with a pain specialist. She did not disclose this to [place] but so found out after receiving a call from the pharmacy. we may have to defer to the pain specialist so we are not caught in the middle of the contract and is a more transparent situation. We will be happy to see her in person if she so desires

## 2018-10-03 ENCOUNTER — Telehealth: Payer: Self-pay

## 2018-10-03 MED ORDER — GABAPENTIN 300 MG PO CAPS: 300.0000 mg | ORAL_CAPSULE | Freq: Three times a day (TID) | ORAL | 0 refills | Status: DC

## 2018-10-03 NOTE — Telephone Encounter (Signed)
She may have 1 gm tylenol q 6 hrs plus ibuprofen 800 mTID. We can also call her in gabapentin 300 mg PO TID # 30.

## 2018-10-03 NOTE — Telephone Encounter (Signed)
She would like to know if their is anything else she can take for pain, no necessarily the narcotic pain medication. She was wondering about over the counter pain medication.

## 2018-10-03 NOTE — Telephone Encounter (Signed)
Patient notified she may use OTC pain medication. Would like for [place] to call in Gabapentin.  This has been sent into her pharmacy. Patient scheduled to follow up on Monday.

## 2018-10-07 ENCOUNTER — Encounter: Payer: Self-pay | Admitting: Surgery

## 2018-10-07 ENCOUNTER — Ambulatory Visit (INDEPENDENT_AMBULATORY_CARE_PROVIDER_SITE_OTHER): Payer: Medicaid Other | Admitting: Surgery

## 2018-10-07 ENCOUNTER — Other Ambulatory Visit: Payer: Self-pay

## 2018-10-07 VITALS — BP 135/84 | HR 103 | Temp 98.1°F | Ht 63.0 in | Wt 198.0 lb

## 2018-10-07 DIAGNOSIS — Z09 Encounter for follow-up examination after completed treatment for conditions other than malignant neoplasm: Secondary | ICD-10-CM

## 2018-10-07 NOTE — Progress Notes (Signed)
S/p robotic Nissen fundoplication Doing well No fever or chills Had some drainage from left side, no stopped Taking PO well, no emesis   PE NAD Incision CDI, no infection, no peritonitis. No drainage, or infection  A/P Doing very well RTC 2 weeks Follow nissen diet

## 2018-10-07 NOTE — Patient Instructions (Signed)
Please see your appointment listed below.

## 2018-10-21 ENCOUNTER — Ambulatory Visit (INDEPENDENT_AMBULATORY_CARE_PROVIDER_SITE_OTHER): Payer: Self-pay | Admitting: Surgery

## 2018-10-21 ENCOUNTER — Other Ambulatory Visit: Payer: Self-pay

## 2018-10-21 ENCOUNTER — Encounter: Payer: Self-pay | Admitting: Surgery

## 2018-10-21 VITALS — BP 131/88 | HR 105 | Temp 97.7°F | Resp 18 | Ht 63.0 in | Wt 196.6 lb

## 2018-10-21 DIAGNOSIS — Z09 Encounter for follow-up examination after completed treatment for conditions other than malignant neoplasm: Secondary | ICD-10-CM

## 2018-10-21 NOTE — Progress Notes (Signed)
S/p Rob  Nissen  Doing well Had some difficulty w meat last week No reflux  PE NAD AbD: incisions c/d/i, no infection, I removed a small tail of monocryl that was stocking out No peritoniits   A/P Doing well D/W Victoria Pierce about strict compliance with diet, I anticipate this episode of swallowing had to do with the meat she had,  No other issues She wishes to follow on a prn basis

## 2018-10-21 NOTE — Patient Instructions (Addendum)
Please call our office if you have questions or concerns.

## 2018-10-24 ENCOUNTER — Telehealth: Payer: Self-pay | Admitting: *Deleted

## 2018-10-24 ENCOUNTER — Ambulatory Visit: Payer: Medicaid Other | Admitting: Obstetrics and Gynecology

## 2018-10-24 NOTE — Telephone Encounter (Signed)
Patient is status post nissen procedure done by Dr. Dahlia Byes 4 weeks ago.   She states she has a few questions that she forgot to ask him about and would appreciate a call back at [phone].  Note routed to the [facility] Clinical Pool.

## 2018-10-24 NOTE — Telephone Encounter (Signed)
She would like to know if or when she would be able to drink soda. She would like also know when she can expect to be back to her regular diet.

## 2018-10-25 NOTE — Telephone Encounter (Signed)
In 2 weeks, small sips at a time

## 2018-10-29 ENCOUNTER — Other Ambulatory Visit: Payer: Self-pay | Admitting: Gastroenterology

## 2018-10-29 DIAGNOSIS — R072 Precordial pain: Secondary | ICD-10-CM

## 2018-10-31 ENCOUNTER — Ambulatory Visit (INDEPENDENT_AMBULATORY_CARE_PROVIDER_SITE_OTHER): Payer: Medicaid Other | Admitting: Gastroenterology

## 2018-10-31 DIAGNOSIS — K219 Gastro-esophageal reflux disease without esophagitis: Secondary | ICD-10-CM

## 2018-10-31 DIAGNOSIS — K580 Irritable bowel syndrome with diarrhea: Secondary | ICD-10-CM | POA: Diagnosis not present

## 2018-10-31 NOTE — Progress Notes (Signed)
Sherri Sear, MD [address]  [place]  [place], [place] [address]  Main: [phone]  Fax: [phone]    Gastroenterology Consultation Video Visit  Referring Provider:     [name], MD Primary Care Physician:  [name], MD Primary Gastroenterologist:  Dr. Cephas Darby Reason for Consultation:     GERD, atypical chest pain, frequent stools        HPI:   Victoria Pierce is a 38 y.o. female referred by Dr. [name], MD  for consultation & management of GERD, atypical chest pain, frequent stools  Virtual Visit Video Note  I connected with Lemmie Evens on 10/31/18 at  3:15 PM EDT by video and verified that I am speaking with the correct person using two identifiers.   I discussed the limitations, risks, security and privacy concerns of performing an evaluation and management service by video and the availability of in person appointments. I also discussed with the patient that there may be a patient responsible charge related to this service. The patient expressed understanding and agreed to proceed.  Location of the Patient: Home  Location of the provider: Home office  Persons participating in the visit: Patient and provider   History of Present Illness: I initially saw Namrata in 04/2018 for chronic GERD, atypical chest pain, lower GI symptoms.  She has been on Protonix 40 mg twice daily for several years.  She was also consuming Goody powder.  She underwent EGD and colonoscopy by me which revealed medium size hiatal hernia, otherwise unremarkable.  Duodenal biopsies revealed mild villous blunting with no intraepithelial lymphocytes and has celiac serologies came back negative.  Colonoscopy was unremarkable including biopsies as well as terminal ileum.  She completely stopped consuming BC powder.  She takes naproxen seldom.  She continues to smoke tobacco.  She is currently on Protonix 40 mg twice daily.  I referred her to general surgery  for fundoplication given the size of hernia and long-term PPI use, Dr. Perrin Maltese saw her in 06/2018.  Her procedure was postponed due to COVID-19 pandemic  Her lower abdominal pain, she underwent ultrasound pelvis which was normal.  Underwent IUD placement about a month ago and she thinks her symptoms are improving   She does not have any major concerns today other than 3 loose, nonbloody bowel movements in the morning  Follow-up visit 10/31/2018 She underwent robotic Nissen fundoplication on 5/99/3570 and has recovered well from surgery.  She does report intermittent mild chest pain only the pain is due to "phantom hernia".  She reports feeling significantly better.  She is taking Protonix 40 mg twice daily.  She continues to have early morning loose stools for which she is taking probiotics and she finds them expensive  NSAIDs: Naproxen occasionally  Antiplts/Anticoagulants/Anti thrombotics: None  GI Procedures: EGD 05/14/2018 - Normal duodenal bulb and second portion of the duodenum. Biopsied. - 4 cm hiatal hernia. - Erythematous mucosa in the gastric body and antrum. Biopsied. - Non-bleeding erosive gastropathy. - Esophagogastric landmarks identified. - Normal esophagus. Biopsied.  Colonoscopy 05/14/2018 - The examined portion of the ileum was normal. - Normal mucosa in the entire examined colon. Biopsied. - Diverticulosis in the sigmoid colon and in the descending colon. - The distal rectum and anal verge are normal on retroflexion view.  DIAGNOSIS:  A. DUODENUM; BIOPSY:  - FEW AREAS OF VILLOUS BLUNTING WITHOUT INCREASED INTRAEPITHELIAL  LYMPHOCYTES.  - SEE COMMENT.   B. STOMACH, RANDOM; BIOPSIES:  -  BENIGN GASTRIC MUCOSA WITH FOCAL FIBROSIS IN LAMINA PROPRIA AND  REACTIVE/REGENERATIVE APPEARING GLANDS.  - NEGATIVE FOR ACTIVE MUCOSAL INFLAMMATION, H. PYLORI, INTESTINAL  METAPLASIA AND DYSPLASIA.  - SEE COMMENT.   C. ESOPHAGUS; BIOPSY:  - NO SIGNIFICANT PATHOLOGIC FEATURES.    D. COLON, RANDOM; BIOPSIES:  - NEGATIVE FOR ACTIVE MUCOSAL INFLAMMATION, GRANULOMAS AND  LYMPHOCYTIC/MICROSCOPIC COLITIS.  - FEATURES OF IDIOPATHIC CHRONIC INFLAMMATORY BOWEL DISEASE (ULCERATIVE  COLITIS) ARE NOT SEEN.  - NO DYSPLASIA OR MALIGNANCY IDENTIFIED.  Past Medical History:  Diagnosis Date  . Anxiety   . Asthma   . Bipolar 1 disorder ([place])   . Depression   . Diverticulitis   . GERD (gastroesophageal reflux disease)   . Hernia, abdominal   . Ulcerative colitis   . Vaginal septum     Past Surgical History:  Procedure Laterality Date  . CLOSED REDUCTION METACARPAL WITH PERCUTANEOUS PINNING Left 03/07/2018   Procedure: CLOSED REDUCTION METACARPAL WITH PERCUTANEOUS PINNING-5th metacarpal;  Surgeon: Corky Mull, MD;  Location: [facility] ORS;  Service: Orthopedics;  Laterality: Left;  . COLONOSCOPY WITH PROPOFOL N/A 05/14/2018   Procedure: COLONOSCOPY WITH PROPOFOL;  Surgeon: Lin Landsman, MD;  Location: [facility] ENDOSCOPY;  Service: Gastroenterology;  Laterality: N/A;  . ESOPHAGOGASTRODUODENOSCOPY (EGD) WITH PROPOFOL N/A 05/14/2018   Procedure: ESOPHAGOGASTRODUODENOSCOPY (EGD) WITH PROPOFOL;  Surgeon: Lin Landsman, MD;  Location: [facility] ENDOSCOPY;  Service: Gastroenterology;  Laterality: N/A;  . HARDWARE REMOVAL Left 04/03/2018   Procedure: LEFT HAND FIFTH METACARPAL PIN REMOVAL;  Surgeon: Corky Mull, MD;  Location: [place];  Service: Orthopedics;  Laterality: Left;  . ROBOTIC ASSISTED LAPAROSCOPIC NISSEN FUNDOPLICATION N/A 6/76/1950   Procedure: ROBOTIC ASSISTED LAPAROSCOPIC NISSEN FUNDOPLICATION;  Surgeon: Jules Husbands, MD;  Location: [facility] ORS;  Service: General;  Laterality: N/A;  . TRIGGER FINGER RELEASE Left 06/19/2018   Procedure: EXTENSOR TENOLYSIS WITH CAPSULAR RELEASE OF LEFT LITTLE MCP JOINT;  Surgeon: Corky Mull, MD;  Location: [place];  Service: Orthopedics;  Laterality: Left;  . TUBAL LIGATION      Current Outpatient Medications:   .  albuterol (PROVENTIL HFA;VENTOLIN HFA) 108 (90 Base) MCG/ACT inhaler, Inhale 2 puffs into the lungs every 6 (six) hours as needed for wheezing or shortness of breath., Disp: , Rfl:  .  cariprazine (VRAYLAR) capsule, Take 1.5 mg by mouth 2 (two) times daily., Disp: , Rfl:  .  cetirizine (ZYRTEC) 10 MG tablet, Take 10 mg by mouth every morning. , Disp: , Rfl:  .  cyclobenzaprine (FLEXERIL) 10 MG tablet, Take 10 mg by mouth 3 (three) times daily as needed for muscle spasms. , Disp: , Rfl:  .  gabapentin (NEURONTIN) 300 MG capsule, Take 1 capsule (300 mg total) by mouth 3 (three) times daily., Disp: 30 capsule, Rfl: 0 .  guanFACINE (INTUNIV) 1 MG TB24 ER tablet, Take 1 mg by mouth every morning. , Disp: , Rfl:  .  hydroxypropyl methylcellulose / hypromellose (ISOPTO TEARS / GONIOVISC) 2.5 % ophthalmic solution, Place 2 drops into both eyes 2 (two) times daily as needed for dry eyes. ARTIFICAL TEARS, Disp: , Rfl:  .  ketotifen ([employer] EYE ITCH RELIEF) 0.025 % ophthalmic solution, Place 2 drops into both eyes 2 (two) times daily. , Disp: , Rfl:  .  Melatonin 5 MG CAPS, Take 5 mg by mouth at bedtime., Disp: , Rfl:  .  Multiple Vitamin (MULTIVITAMIN WITH MINERALS) TABS tablet, Take 1 tablet by mouth daily., Disp: , Rfl:  .  naproxen (NAPROSYN)  500 MG tablet, Take 500 mg by mouth 2 (two) times daily as needed for moderate pain. , Disp: , Rfl:  .  ondansetron (ZOFRAN-ODT) 4 MG disintegrating tablet, Take 1 tablet (4 mg total) by mouth every 6 (six) hours as needed for nausea or vomiting., Disp: 40 tablet, Rfl: 0 .  pantoprazole (PROTONIX) 40 MG tablet, TAKE 1 TABLET (40 MG TOTAL) BY MOUTH 2 (TWO) TIMES DAILY BEFORE A MEAL., Disp: 60 tablet, Rfl: 5 .  Probiotic Product (PROBIOTIC DAILY PO), Take 1 capsule by mouth daily., Disp: , Rfl:  .  promethazine (PHENERGAN) 25 MG tablet, Take 25 mg by mouth every 6 (six) hours as needed for nausea or vomiting., Disp: , Rfl:  .  propranolol (INDERAL) 10 MG tablet, Take 10  mg by mouth 2 (two) times daily., Disp: , Rfl:  .  topiramate (TOPAMAX) 50 MG tablet, Take 50 mg by mouth 2 (two) times daily., Disp: , Rfl:  .  traZODone (DESYREL) 100 MG tablet, Take 100 mg by mouth at bedtime., Disp: , Rfl:  .  vitamin B-12 (CYANOCOBALAMIN) 1000 MCG tablet, Take 1,000 mcg by mouth daily., Disp: , Rfl:  .  gabapentin (NEURONTIN) 300 MG capsule, Take 900 mg by mouth 2 (two) times daily. , Disp: , Rfl:   Current Facility-Administered Medications:  .  levonorgestrel (MIRENA) 20 MCG/24HR IUD, , Intrauterine, Once, Schuman, Christanna R, MD   Family History  Problem Relation Age of Onset  . Hypertension Sister      Social History   Tobacco Use  . Smoking status: Current Every Day Smoker    Packs/day: 1.00    Years: 27.00    Pack years: 27.00    Types: Cigarettes  . Smokeless tobacco: Never Used  Substance Use Topics  . Alcohol use: No  . Drug use: Yes    Types: Marijuana    Allergies as of 10/31/2018  . (No Known Allergies)     Imaging Studies: Reviewed  Assessment and Plan:   FREYA ZOBRIST is a 38 y.o. Caucasian female with history of bipolar, chronic GERD refractory to PPI status post Nissen fundoplication with repair of hiatal hernia, chronic lower abdominal pain, bloating and nonbloody diarrhea  GERD, hiatal hernia Status post Nissen fundoplication with repair of hiatal hernia EGD with esophageal and gastric biopsies were unremarkable Decrease Protonix to 40 mg once a day for 2 to 3 weeks, then stop  IBS-diarrhea Suggested her to discuss with her psychiatrist about trial of amitriptyline 25 mg at bedtime by switching with 1 of her existing medications as patient is already on 3 different psychotropic medications  Follow Up Instructions:   I discussed the assessment and treatment plan with the patient. The patient was provided an opportunity to ask questions and all were answered. The patient agreed with the plan and demonstrated an  understanding of the instructions.   The patient was advised to call back or seek an in-person evaluation if the symptoms worsen or if the condition fails to improve as anticipated.  I provided 15 minutes of face-to-face time during this encounter.   Follow up as needed   Cephas Darby, MD

## 2018-11-07 ENCOUNTER — Encounter: Payer: Self-pay | Admitting: Obstetrics and Gynecology

## 2018-11-07 ENCOUNTER — Other Ambulatory Visit: Payer: Self-pay

## 2018-11-07 ENCOUNTER — Ambulatory Visit (INDEPENDENT_AMBULATORY_CARE_PROVIDER_SITE_OTHER): Payer: Medicaid Other | Admitting: Obstetrics and Gynecology

## 2018-11-07 VITALS — BP 120/84 | HR 91 | Ht 63.0 in | Wt 194.0 lb

## 2018-11-07 DIAGNOSIS — Z30431 Encounter for routine checking of intrauterine contraceptive device: Secondary | ICD-10-CM

## 2018-11-07 DIAGNOSIS — Z716 Tobacco abuse counseling: Secondary | ICD-10-CM | POA: Diagnosis not present

## 2018-11-07 DIAGNOSIS — N951 Menopausal and female climacteric states: Secondary | ICD-10-CM

## 2018-11-07 MED ORDER — VARENICLINE TARTRATE 1 MG PO TABS: 1.0000 mg | ORAL_TABLET | Freq: Two times a day (BID) | ORAL | 5 refills | Status: DC

## 2018-11-07 MED ORDER — VARENICLINE TARTRATE 0.5 MG PO TABS: 0.5000 mg | ORAL_TABLET | Freq: Two times a day (BID) | ORAL | 0 refills | Status: DC

## 2018-11-07 NOTE — Progress Notes (Signed)
Patient ID: Victoria Pierce, female   DOB: 10/30/1980, 38 y.o.   MRN: [id number]  Reason for Consult: Follow-up (Having major hot flashes, nausea, cramping is gone away mostly, periods are lasting about 10-11 days, mood swings. )   Referred by [name], MD  Subjective:     HPI:  Victoria Pierce is a 38 y.o. female. She presents today for an IUD check. She has several additional concerns. She reports that her bleeding is improved, but she still has 10-12 days of light spotting. She is concerned that she needs a hysterectomy. She says "my mom and sister had hysterectomies at the age of 54." She additionally  Reports 5 hot flashes a day with frequent night time awakening. She has low sex drive and mood swings.   Additionally she is interested in smoking cessation. She is currently smoking a 1/2 ppd, about 20 cigarettes. She has stopped smoking in the past reports that it is hard because her family and boyfriend smoke and she is always around tobacco and has frequent temptations.     Past Medical History:  Diagnosis Date  . Anxiety   . Asthma   . Bipolar 1 disorder ([place])   . Depression   . Diverticulitis   . GERD (gastroesophageal reflux disease)   . Hernia, abdominal   . Ulcerative colitis   . Vaginal septum    Family History  Problem Relation Age of Onset  . Hypertension Sister    Past Surgical History:  Procedure Laterality Date  . CLOSED REDUCTION METACARPAL WITH PERCUTANEOUS PINNING Left 03/07/2018   Procedure: CLOSED REDUCTION METACARPAL WITH PERCUTANEOUS PINNING-5th metacarpal;  Surgeon: Corky Mull, MD;  Location: [facility] ORS;  Service: Orthopedics;  Laterality: Left;  . COLONOSCOPY WITH PROPOFOL N/A 05/14/2018   Procedure: COLONOSCOPY WITH PROPOFOL;  Surgeon: Lin Landsman, MD;  Location: [facility] ENDOSCOPY;  Service: Gastroenterology;  Laterality: N/A;  . ESOPHAGOGASTRODUODENOSCOPY (EGD) WITH PROPOFOL N/A 05/14/2018   Procedure: ESOPHAGOGASTRODUODENOSCOPY  (EGD) WITH PROPOFOL;  Surgeon: Lin Landsman, MD;  Location: [facility] ENDOSCOPY;  Service: Gastroenterology;  Laterality: N/A;  . HARDWARE REMOVAL Left 04/03/2018   Procedure: LEFT HAND FIFTH METACARPAL PIN REMOVAL;  Surgeon: Corky Mull, MD;  Location: [place];  Service: Orthopedics;  Laterality: Left;  . ROBOTIC ASSISTED LAPAROSCOPIC NISSEN FUNDOPLICATION N/A 0/93/2355   Procedure: ROBOTIC ASSISTED LAPAROSCOPIC NISSEN FUNDOPLICATION;  Surgeon: Jules Husbands, MD;  Location: [facility] ORS;  Service: General;  Laterality: N/A;  . TRIGGER FINGER RELEASE Left 06/19/2018   Procedure: EXTENSOR TENOLYSIS WITH CAPSULAR RELEASE OF LEFT LITTLE MCP JOINT;  Surgeon: Corky Mull, MD;  Location: [place];  Service: Orthopedics;  Laterality: Left;  . TUBAL LIGATION      Short Social History:  Social History   Tobacco Use  . Smoking status: Current Every Day Smoker    Packs/day: 1.00    Years: 27.00    Pack years: 27.00    Types: Cigarettes  . Smokeless tobacco: Never Used  Substance Use Topics  . Alcohol use: No    No Known Allergies  Current Outpatient Medications  Medication Sig Dispense Refill  . cariprazine (VRAYLAR) capsule Take 1.5 mg by mouth 2 (two) times daily.    . cetirizine (ZYRTEC) 10 MG tablet Take 10 mg by mouth every morning.     . cyclobenzaprine (FLEXERIL) 10 MG tablet Take 10 mg by mouth 3 (three) times daily as needed for muscle spasms.     Marland Kitchen gabapentin (NEURONTIN)  300 MG capsule Take 900 mg by mouth 2 (two) times daily.     Marland Kitchen guanFACINE (INTUNIV) 1 MG TB24 ER tablet Take 1 mg by mouth every morning.     . hydroxypropyl methylcellulose / hypromellose (ISOPTO TEARS / GONIOVISC) 2.5 % ophthalmic solution Place 2 drops into both eyes 2 (two) times daily as needed for dry eyes. ARTIFICAL TEARS    . ketotifen ([employer] EYE ITCH RELIEF) 0.025 % ophthalmic solution Place 2 drops into both eyes 2 (two) times daily.     . Melatonin 5 MG CAPS Take 5 mg by mouth at  bedtime.    . Multiple Vitamin (MULTIVITAMIN WITH MINERALS) TABS tablet Take 1 tablet by mouth daily.    . naproxen (NAPROSYN) 500 MG tablet Take 500 mg by mouth 2 (two) times daily as needed for moderate pain.     Marland Kitchen ondansetron (ZOFRAN-ODT) 4 MG disintegrating tablet Take 1 tablet (4 mg total) by mouth every 6 (six) hours as needed for nausea or vomiting. 40 tablet 0  . pantoprazole (PROTONIX) 40 MG tablet TAKE 1 TABLET (40 MG TOTAL) BY MOUTH 2 (TWO) TIMES DAILY BEFORE A MEAL. 60 tablet 5  . promethazine (PHENERGAN) 25 MG tablet Take 25 mg by mouth every 6 (six) hours as needed for nausea or vomiting.    . propranolol (INDERAL) 10 MG tablet Take 10 mg by mouth 2 (two) times daily.    Marland Kitchen topiramate (TOPAMAX) 50 MG tablet Take 50 mg by mouth 2 (two) times daily.    . traZODone (DESYREL) 100 MG tablet Take 100 mg by mouth at bedtime.    . vitamin B-12 (CYANOCOBALAMIN) 1000 MCG tablet Take 1,000 mcg by mouth daily.    Marland Kitchen albuterol (PROVENTIL HFA;VENTOLIN HFA) 108 (90 Base) MCG/ACT inhaler Inhale 2 puffs into the lungs every 6 (six) hours as needed for wheezing or shortness of breath.    . Probiotic Product (PROBIOTIC DAILY PO) Take 1 capsule by mouth daily.    . varenicline (CHANTIX CONTINUING MONTH PAK) 1 MG tablet Take 1 tablet (1 mg total) by mouth 2 (two) times daily. 60 tablet 5  . varenicline (CHANTIX) 0.5 MG tablet Take 1 tablet (0.5 mg total) by mouth 2 (two) times daily. 0.5 mg daily for three days, then 0.5 mg twice daily for four days 11 tablet 0   Current Facility-Administered Medications  Medication Dose Route Frequency Provider Last Rate Last Dose  . levonorgestrel (MIRENA) 20 MCG/24HR IUD   Intrauterine Once Schuman, Christanna R, MD        REVIEW OF SYSTEMS      Objective:  Objective   Vitals:   11/07/18 0848  BP: 120/84  Pulse: 91  Weight: 194 lb (88 kg)  Height: 5' 3"  (1.6 m)   Body mass index is 34.37 kg/m.  Physical Exam Vitals signs and nursing note reviewed. Exam  conducted with a chaperone present.  Constitutional:      Appearance: She is well-developed.  HENT:     Head: Normocephalic and atraumatic.  Eyes:     Pupils: Pupils are equal, round, and reactive to light.  Cardiovascular:     Rate and Rhythm: Normal rate and regular rhythm.  Pulmonary:     Effort: Pulmonary effort is normal. No respiratory distress.  Genitourinary:    Comments: External: Normal appearing vulva. No lesions noted.  Speculum examination: Normal appearing cervix. No blood in the vaginal vault. no discharge.   IUD strings seen Skin:    General: Skin  is warm and dry.  Neurological:     Mental Status: She is alert and oriented to person, place, and time.  Psychiatric:        Behavior: Behavior normal.        Thought Content: Thought content normal.        Judgment: Judgment normal.          Assessment/Plan:     38 yo with several concerns.  Chantix for smoking cessation. Discussed in detail strategies for stopping smoking. Provided patient with resources for quitting. See AVS.   IUD is in place, strings seen. Discussed expectation for bleeding patterns and cramping after insertion. Continue with IUD.   Discussed SSRI vs hormone replacment therapy. Will test [facility] and estradiol and make decision about management of vasomotor symptoms based on result.   More than 30 minutes were spent face to face with the patient in the room with more than 50% of the time spent providing counseling and discussing the plan of management. We discussed.    Adrian Prows MD [facility] 11/07/2018 9:37 AM

## 2018-11-07 NOTE — Patient Instructions (Addendum)
1-800-QUIT-NOW [phone])   The recommended dose of varenicline is 0.5 mg daily for three days, then 0.5 mg twice daily for four days, and then 1 mg twice daily for the remainder of a 12-week course    Varenicline oral tablets What is this medicine? VARENICLINE (var e NI kleen) is used to help people quit smoking. It is used with a patient support program recommended by your physician. This medicine may be used for other purposes; ask your health care provider or pharmacist if you have questions. COMMON BRAND NAME(S): Chantix What should I tell my health care provider before I take this medicine? They need to know if you have any of these conditions:  heart disease  if you often drink alcohol  kidney disease  mental illness  on hemodialysis  seizures  history of stroke  suicidal thoughts, plans, or attempt; a previous suicide attempt by you or a family member  an unusual or allergic reaction to varenicline, other medicines, foods, dyes, or preservatives  pregnant or trying to get pregnant  breast-feeding How should I use this medicine? Take this medicine by mouth after eating. Take with a full glass of water. Follow the directions on the prescription label. Take your doses at regular intervals. Do not take your medicine more often than directed. There are 3 ways you can use this medicine to help you quit smoking; talk to your health care professional to decide which plan is right for you: 1) you can choose a quit date and start this medicine 1 week before the quit date, or, 2) you can start taking this medicine before you choose a quit date, and then pick a quit date between day 8 and 35 days of treatment, or, 3) if you are not sure that you are able or willing to quit smoking right away, start taking this medicine and slowly decrease the amount you smoke as directed by your health care professional with the goal of being cigarette-free by week 12 of treatment. Stick  to your plan; ask about support groups or other ways to help you remain cigarette-free. If you are motivated to quit smoking and did not succeed during a previous attempt with this medicine for reasons other than side effects, or if you returned to smoking after this treatment, speak with your health care professional about whether another course of this medicine may be right for you. A special MedGuide will be given to you by the pharmacist with each prescription and refill. Be sure to read this information carefully each time. Talk to your pediatrician regarding the use of this medicine in children. This medicine is not approved for use in children. Overdosage: If you think you have taken too much of this medicine contact a poison control center or emergency room at once. NOTE: This medicine is only for you. Do not share this medicine with others. What if I miss a dose? If you miss a dose, take it as soon as you can. If it is almost time for your next dose, take only that dose. Do not take double or extra doses. What may interact with this medicine?  alcohol  insulin  other medicines used to help people quit smoking  theophylline  warfarin This list may not describe all possible interactions. Give your health care provider a list of all the medicines, herbs, non-prescription drugs, or dietary supplements you use. Also tell them if you smoke, drink alcohol, or use illegal drugs. Some items may interact with your medicine.  What should I watch for while using this medicine? It is okay if you do not succeed at your attempt to quit and have a cigarette. You can still continue your quit attempt and keep using this medicine as directed. Just throw away your cigarettes and get back to your quit plan. Talk to your health care provider before using other treatments to quit smoking. Using this medicine with other treatments to quit smoking may increase the risk for side effects compared to using a  treatment alone. You may get drowsy or dizzy. Do not drive, use machinery, or do anything that needs mental alertness until you know how this medicine affects you. Do not stand or sit up quickly, especially if you are an older patient. This reduces the risk of dizzy or fainting spells. Decrease the number of alcoholic beverages that you drink during treatment with this medicine until you know if this medicine affects your ability to tolerate alcohol. Some people have experienced increased drunkenness (intoxication), unusual or sometimes aggressive behavior, or no memory of things that have happened (amnesia) during treatment with this medicine. Sleepwalking can happen during treatment with this medicine, and can sometimes lead to behavior that is harmful to you, other people, or property. Stop taking this medicine and tell your doctor if you start sleepwalking or have other unusual sleep-related activity. After taking this medicine, you may get up out of bed and do an activity that you do not know you are doing. The next morning, you may have no memory of this. Activities include driving a car ("sleep-driving"), making and eating food, talking on the phone, sexual activity, and sleep-walking. Serious injuries have occurred. Stop the medicine and call your doctor right away if you find out you have done any of these activities. Do not take this medicine if you have used alcohol that evening. Do not take it if you have taken another medicine for sleep. The risk of doing these sleep-related activities is higher. Patients and their families should watch out for new or worsening depression or thoughts of suicide. Also watch out for sudden changes in feelings such as feeling anxious, agitated, panicky, irritable, hostile, aggressive, impulsive, severely restless, overly excited and hyperactive, or not being able to sleep. If this happens, call your health care professional. If you have diabetes and you quit smoking,  the effects of insulin may be increased and you may need to reduce your insulin dose. Check with your doctor or health care professional about how you should adjust your insulin dose. What side effects may I notice from receiving this medicine? Side effects that you should report to your doctor or health care professional as soon as possible:  allergic reactions like skin rash, itching or hives, swelling of the face, lips, tongue, or throat  acting aggressive, being angry or violent, or acting on dangerous impulses  breathing problems  changes in emotions or moods  chest pain or chest tightness  feeling faint or lightheaded, falls  hallucination, loss of contact with reality  mouth sores  redness, blistering, peeling or loosening of the skin, including inside the mouth  signs and symptoms of a stroke like changes in vision; confusion; trouble speaking or understanding; severe headaches; sudden numbness or weakness of the face, arm or leg; trouble walking; dizziness; loss of balance or coordination  seizures  sleepwalking  suicidal thoughts or other mood changes Side effects that usually do not require medical attention (report to your doctor or health care professional if they continue  or are bothersome):  constipation  gas  headache  nausea, vomiting  strange dreams  trouble sleeping This list may not describe all possible side effects. Call your doctor for medical advice about side effects. You may report side effects to FDA at 1-800-FDA-1088. Where should I keep my medicine? Keep out of the reach of children. Store at room temperature between 15 and 30 degrees C (59 and 86 degrees F). Throw away any unused medicine after the expiration date. NOTE: This sheet is a summary. It may not cover all possible information. If you have questions about this medicine, talk to your doctor, pharmacist, or health care provider.  2020 Elsevier/Gold Standard (2018-03-22  14:27:36) Steps to Quit Smoking Smoking tobacco is the leading cause of preventable death. It can affect almost every organ in the body. Smoking puts you and people around you at risk for many serious, long-lasting (chronic) diseases. Quitting smoking can be hard, but it is one of the best things that you can do for your health. It is never too late to quit. How do I get ready to quit? When you decide to quit smoking, make a plan to help you succeed. Before you quit:  Pick a date to quit. Set a date within the next 2 weeks to give you time to prepare.  Write down the reasons why you are quitting. Keep this list in places where you will see it often.  Tell your family, friends, and co-workers that you are quitting. Their support is important.  Talk with your doctor about the choices that may help you quit.  Find out if your health insurance will pay for these treatments.  Know the people, places, things, and activities that make you want to smoke (triggers). Avoid them. What first steps can I take to quit smoking?  Throw away all cigarettes at home, at work, and in your car.  Throw away the things that you use when you smoke, such as ashtrays and lighters.  Clean your car. Make sure to empty the ashtray.  Clean your home, including curtains and carpets. What can I do to help me quit smoking? Talk with your doctor about taking medicines and seeing a counselor at the same time. You are more likely to succeed when you do both.  If you are pregnant or breastfeeding, talk with your doctor about counseling or other ways to quit smoking. Do not take medicine to help you quit smoking unless your doctor tells you to do so. To quit smoking: Quit right away  Quit smoking totally, instead of slowly cutting back on how much you smoke over a period of time.  Go to counseling. You are more likely to quit if you go to counseling sessions regularly. Take medicine You may take medicines to help you  quit. Some medicines need a prescription, and some you can buy over-the-counter. Some medicines may contain a drug called nicotine to replace the nicotine in cigarettes. Medicines may:  Help you to stop having the desire to smoke (cravings).  Help to stop the problems that come when you stop smoking (withdrawal symptoms). Your doctor may ask you to use:  Nicotine patches, gum, or lozenges.  Nicotine inhalers or sprays.  Non-nicotine medicine that is taken by mouth. Find resources Find resources and other ways to help you quit smoking and remain smoke-free after you quit. These resources are most helpful when you use them often. They include:  Online chats with a Social worker.  Phone quitlines.  Printed  self-help materials.  Support groups or group counseling.  Text messaging programs.  Mobile phone apps. Use apps on your mobile phone or tablet that can help you stick to your quit plan. There are many free apps for mobile phones and tablets as well as websites. Examples include Quit Guide from the [employer] and smokefree.gov  What things can I do to make it easier to quit?   Talk to your family and friends. Ask them to support and encourage you.  Call a phone quitline (1-800-QUIT-NOW), reach out to support groups, or work with a Social worker.  Ask people who smoke to not smoke around you.  Avoid places that make you want to smoke, such as: ? Bars. ? Parties. ? Smoke-break areas at work.  Spend time with people who do not smoke.  Lower the stress in your life. Stress can make you want to smoke. Try these things to help your stress: ? Getting regular exercise. ? Doing deep-breathing exercises. ? Doing yoga. ? Meditating. ? Doing a body scan. To do this, close your eyes, focus on one area of your body at a time from head to toe. Notice which parts of your body are tense. Try to relax the muscles in those areas. How will I feel when I quit smoking? Day 1 to 3 weeks Within the first  24 hours, you may start to have some problems that come from quitting tobacco. These problems are very bad 2-3 days after you quit, but they do not often last for more than 2-3 weeks. You may get these symptoms:  Mood swings.  Feeling restless, nervous, angry, or annoyed.  Trouble concentrating.  Dizziness.  Strong desire for high-sugar foods and nicotine.  Weight gain.  Trouble pooping (constipation).  Feeling like you may vomit (nausea).  Coughing or a sore throat.  Changes in how the medicines that you take for other issues work in your body.  Depression.  Trouble sleeping (insomnia). Week 3 and afterward After the first 2-3 weeks of quitting, you may start to notice more positive results, such as:  Better sense of smell and taste.  Less coughing and sore throat.  Slower heart rate.  Lower blood pressure.  Clearer skin.  Better breathing.  Fewer sick days. Quitting smoking can be hard. Do not give up if you fail the first time. Some people need to try a few times before they succeed. Do your best to stick to your quit plan, and talk with your doctor if you have any questions or concerns. Summary  Smoking tobacco is the leading cause of preventable death. Quitting smoking can be hard, but it is one of the best things that you can do for your health.  When you decide to quit smoking, make a plan to help you succeed.  Quit smoking right away, not slowly over a period of time.  When you start quitting, seek help from your doctor, family, or friends. This information is not intended to replace advice given to you by your health care provider. Make sure you discuss any questions you have with your health care provider. Document Released: 01/28/2009 Document Revised: 06/21/2018 Document Reviewed: 06/22/2018 Elsevier Patient Education  2020 [employer].

## 2018-11-08 LAB — ESTRADIOL: Estradiol: 163.6 pg/mL

## 2018-11-08 LAB — FOLLICLE STIMULATING HORMONE: FSH: 6.5 m[IU]/mL

## 2018-11-13 ENCOUNTER — Telehealth: Payer: Self-pay

## 2018-11-13 ENCOUNTER — Encounter: Payer: Self-pay | Admitting: Gastroenterology

## 2018-11-13 NOTE — Telephone Encounter (Signed)
Pt inquiring about lab results from 11/07/2018. Labs resulted, but not signed off on. [phone]

## 2018-11-14 ENCOUNTER — Other Ambulatory Visit: Payer: Self-pay | Admitting: Obstetrics and Gynecology

## 2018-11-14 DIAGNOSIS — N951 Menopausal and female climacteric states: Secondary | ICD-10-CM

## 2018-11-14 MED ORDER — PAROXETINE HCL 10 MG PO TABS: 10.0000 mg | ORAL_TABLET | Freq: Every day | ORAL | 11 refills | Status: DC

## 2018-11-14 NOTE — Telephone Encounter (Signed)
Pt calling triage again about these results.

## 2018-11-14 NOTE — Progress Notes (Signed)
Called and discussed with patient. Will start paxil.

## 2018-11-15 ENCOUNTER — Other Ambulatory Visit: Payer: Self-pay | Admitting: Obstetrics and Gynecology

## 2018-11-15 DIAGNOSIS — Z716 Tobacco abuse counseling: Secondary | ICD-10-CM

## 2018-11-29 ENCOUNTER — Encounter: Payer: Self-pay | Admitting: Gastroenterology

## 2018-12-04 ENCOUNTER — Encounter: Payer: Self-pay | Admitting: Obstetrics and Gynecology

## 2018-12-04 ENCOUNTER — Telehealth: Payer: Self-pay | Admitting: Obstetrics and Gynecology

## 2018-12-04 ENCOUNTER — Other Ambulatory Visit: Payer: Self-pay

## 2018-12-04 ENCOUNTER — Ambulatory Visit (INDEPENDENT_AMBULATORY_CARE_PROVIDER_SITE_OTHER): Payer: Medicaid Other | Admitting: Obstetrics and Gynecology

## 2018-12-04 DIAGNOSIS — R232 Flushing: Secondary | ICD-10-CM | POA: Diagnosis not present

## 2018-12-04 DIAGNOSIS — Z716 Tobacco abuse counseling: Secondary | ICD-10-CM

## 2018-12-04 MED ORDER — VITAMIN E 180 MG (400 UNIT) PO CAPS: 400.0000 [IU] | ORAL_CAPSULE | Freq: Every day | ORAL | 11 refills | Status: DC

## 2018-12-04 NOTE — Progress Notes (Signed)
Virtual Visit via Telephone Note  I connected with Victoria Pierce on 12/04/18 at  8:50 AM EDT by telephone and verified that I am speaking with the correct person using two identifiers.   I discussed the limitations, risks, security and privacy concerns of performing an evaluation and management service by telephone and the availability of in person appointments. I also discussed with the patient that there may be a patient responsible charge related to this service. The patient expressed understanding and agreed to proceed.  The patient was at home I spoke with the patient from my  office The names of people involved in this encounter were: Micah Noel and Dr. Gilman Schmidt.  History of Present Illness: She is following up today for concerns of vasomotor symptoms and smoking cessation.  She did not start paxil because of her other medications and potential side effects. She Will consider starting vitamin E instead.   She has made progress in smoking cessation. She has stopped chantix because of nightmares. She  was smoking 1 ppd. Now she is smoking 6-7 cigarettes a day No quit date set yet. Encouraged patient on her already significant progress.   She is happy with IUD, bleeding pattern has been improving.  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 Because of the COVID-19 outbreak this visit was performed over the phone and not in person.   Assessment and Plan: 38 yo following up for vasomotor symptoms and tobacco cessation.  Continue to monitor and support tobacco cessation.  Will trial Vitamin E for vasomotor symptoms  Follow Up Instructions: 3 months, telephone visit   I discussed the assessment and treatment plan with the patient. The patient was provided an opportunity to ask questions and all were answered. The patient agreed with the plan and demonstrated an understanding  of the instructions.   The patient was advised to call back or seek an in-person evaluation if the symptoms worsen or if the condition fails to improve as anticipated.  I provided 15 minutes of non-face-to-face time during this encounter.  Adrian Prows MD [facility] 12/04/2018 11:47 AM

## 2018-12-04 NOTE — Telephone Encounter (Signed)
-----   Message from Homero Fellers, MD sent at 12/04/2018  9:58 AM EDT ----- Please call this patient about scheduling a telephone follow up visit in 2-3 months.  Thank you! Dr. Gilman Schmidt

## 2018-12-04 NOTE — Telephone Encounter (Signed)
Called and left voice mail for patient to call back to be schedule °

## 2018-12-06 ENCOUNTER — Other Ambulatory Visit: Payer: Self-pay | Admitting: Obstetrics and Gynecology

## 2018-12-06 DIAGNOSIS — N951 Menopausal and female climacteric states: Secondary | ICD-10-CM

## 2018-12-20 ENCOUNTER — Ambulatory Visit (INDEPENDENT_AMBULATORY_CARE_PROVIDER_SITE_OTHER): Payer: Medicaid Other | Admitting: Gastroenterology

## 2018-12-20 ENCOUNTER — Other Ambulatory Visit: Payer: Self-pay

## 2018-12-20 VITALS — BP 119/80 | HR 94 | Temp 98.4°F | Ht 63.0 in | Wt 187.0 lb

## 2018-12-20 DIAGNOSIS — K602 Anal fissure, unspecified: Secondary | ICD-10-CM | POA: Diagnosis not present

## 2018-12-20 DIAGNOSIS — K641 Second degree hemorrhoids: Secondary | ICD-10-CM

## 2018-12-20 DIAGNOSIS — F411 Generalized anxiety disorder: Secondary | ICD-10-CM

## 2018-12-20 DIAGNOSIS — K5904 Chronic idiopathic constipation: Secondary | ICD-10-CM | POA: Diagnosis not present

## 2018-12-20 MED ORDER — AMITRIPTYLINE HCL 25 MG PO TABS: 25.0000 mg | ORAL_TABLET | Freq: Every day | ORAL | 2 refills | Status: DC

## 2018-12-20 NOTE — Patient Instructions (Signed)

## 2018-12-20 NOTE — Progress Notes (Signed)
Cephas Darby, MD [address]  [place], [place] [address]  Main: [phone]  Fax: [phone] Pager: [phone]   Primary Care Physician: [name], MD  Primary Gastroenterologist:  Dr. Cephas Darby  Chief Complaint  Patient presents with  . Constipation  . Hemorrhoids    HPI: Victoria Pierce is a 38 y.o. female with history of chronic refractory GERD, status post Nissen fundoplication, symptomatic hemorrhoids, IBS with diarrhea.  With regards to her GERD, patient decreased PPI to Protonix 40 mg once a day and feels significantly better after fundoplication.  However, she reports constant gurgling in her left upper quadrant.  For the last 2 weeks, her constipation has gotten severe associated with rectal bleeding on wiping, rectal discomfort, protrusion of hemorrhoids.  She continues to smoke cigarettes,  incorporate more starches in her diet, consumes red meat regularly.  She is currently on amitriptyline 50 mg at bedtime  Current Outpatient Medications  Medication Sig Dispense Refill  . albuterol (PROVENTIL HFA;VENTOLIN HFA) 108 (90 Base) MCG/ACT inhaler Inhale 2 puffs into the lungs every 6 (six) hours as needed for wheezing or shortness of breath.    Marland Kitchen amitriptyline (ELAVIL) 25 MG tablet Take 1 tablet (25 mg total) by mouth at bedtime. 30 tablet 2  . cariprazine (VRAYLAR) capsule Take 1.5 mg by mouth 2 (two) times daily.    . cetirizine (ZYRTEC) 10 MG tablet Take 10 mg by mouth every morning.     Marland Kitchen CHANTIX 0.5 MG tablet TAKE 1 TAB BY MOUTH EVERY DAY FOR 3 DAYS THEN TAKE 1 TAB TWICE A DAY FOR 4 DAYS (Patient not taking: Reported on 12/20/2018) 11 tablet 0  . cyclobenzaprine (FLEXERIL) 10 MG tablet Take 10 mg by mouth 3 (three) times daily as needed for muscle spasms.     Marland Kitchen gabapentin (NEURONTIN) 300 MG capsule Take 900 mg by mouth 2 (two) times daily.     Marland Kitchen guanFACINE (INTUNIV) 1 MG TB24 ER tablet Take 1 mg by mouth every morning.     .  hydroxypropyl methylcellulose / hypromellose (ISOPTO TEARS / GONIOVISC) 2.5 % ophthalmic solution Place 2 drops into both eyes 2 (two) times daily as needed for dry eyes. ARTIFICAL TEARS    . ketotifen ([employer] EYE ITCH RELIEF) 0.025 % ophthalmic solution Place 2 drops into both eyes 2 (two) times daily.     . Multiple Vitamin (MULTIVITAMIN WITH MINERALS) TABS tablet Take 1 tablet by mouth daily.    . naproxen (NAPROSYN) 500 MG tablet Take 500 mg by mouth 2 (two) times daily as needed for moderate pain.     Marland Kitchen ondansetron (ZOFRAN-ODT) 4 MG disintegrating tablet Take 1 tablet (4 mg total) by mouth every 6 (six) hours as needed for nausea or vomiting. 40 tablet 0  . pantoprazole (PROTONIX) 40 MG tablet TAKE 1 TABLET (40 MG TOTAL) BY MOUTH 2 (TWO) TIMES DAILY BEFORE A MEAL. 60 tablet 5  . PARoxetine (PAXIL) 10 MG tablet Take 1 tablet (10 mg total) by mouth daily. (Patient not taking: Reported on 12/04/2018) 30 tablet 11  . promethazine (PHENERGAN) 25 MG tablet Take 25 mg by mouth every 6 (six) hours as needed for nausea or vomiting.    . propranolol (INDERAL) 10 MG tablet Take 10 mg by mouth 2 (two) times daily.    Marland Kitchen topiramate (TOPAMAX) 50 MG tablet Take 50 mg by mouth 2 (two) times daily.    . varenicline (CHANTIX CONTINUING MONTH PAK) 1 MG  tablet Take 1 tablet (1 mg total) by mouth 2 (two) times daily. (Patient not taking: Reported on 12/20/2018) 60 tablet 5  . vitamin B-12 (CYANOCOBALAMIN) 1000 MCG tablet Take 1,000 mcg by mouth daily.    . vitamin E (VITAMIN E) 400 UNIT capsule Take 1 capsule (400 Units total) by mouth daily. 30 capsule 11   Current Facility-Administered Medications  Medication Dose Route Frequency Provider Last Rate Last Dose  . levonorgestrel (MIRENA) 20 MCG/24HR IUD   Intrauterine Once Homero Fellers, MD        Allergies as of 12/20/2018  . (No Known Allergies)    NSAIDs: None  Antiplts/Anticoagulants/Anti thrombotics: None  GI procedures:  EGD 05/14/2018 - Normal  duodenal bulb and second portion of the duodenum. Biopsied. - 4 cm hiatal hernia. - Erythematous mucosa in the gastric body and antrum. Biopsied. - Non-bleeding erosive gastropathy. - Esophagogastric landmarks identified. - Normal esophagus. Biopsied.  Colonoscopy 05/14/2018 - The examined portion of the ileum was normal. - Normal mucosa in the entire examined colon. Biopsied. - Diverticulosis in the sigmoid colon and in the descending colon. - The distal rectum and anal verge are normal on retroflexion view.  DIAGNOSIS:  A. DUODENUM; BIOPSY:  - FEW AREAS OF VILLOUS BLUNTING WITHOUT INCREASED INTRAEPITHELIAL  LYMPHOCYTES.  - SEE COMMENT.   B. STOMACH, RANDOM; BIOPSIES:  - BENIGN GASTRIC MUCOSA WITH FOCAL FIBROSIS IN LAMINA PROPRIA AND  REACTIVE/REGENERATIVE APPEARING GLANDS.  - NEGATIVE FOR ACTIVE MUCOSAL INFLAMMATION, H. PYLORI, INTESTINAL  METAPLASIA AND DYSPLASIA.  - SEE COMMENT.   C. ESOPHAGUS; BIOPSY:  - NO SIGNIFICANT PATHOLOGIC FEATURES.   D. COLON, RANDOM; BIOPSIES:  - NEGATIVE FOR ACTIVE MUCOSAL INFLAMMATION, GRANULOMAS AND  LYMPHOCYTIC/MICROSCOPIC COLITIS.  - FEATURES OF IDIOPATHIC CHRONIC INFLAMMATORY BOWEL DISEASE (ULCERATIVE  COLITIS) ARE NOT SEEN.  - NO DYSPLASIA OR MALIGNANCY IDENTIFIED.  ROS:  General: Negative for anorexia, weight loss, fever, chills, fatigue, weakness. ENT: Negative for hoarseness, difficulty swallowing , nasal congestion. CV: Negative for chest pain, angina, palpitations, dyspnea on exertion, peripheral edema.  Respiratory: Negative for dyspnea at rest, dyspnea on exertion, cough, sputum, wheezing.  GI: See history of present illness. GU:  Negative for dysuria, hematuria, urinary incontinence, urinary frequency, nocturnal urination.  Endo: Negative for unusual weight change.    Physical Examination:   BP 119/80   Pulse 94   Temp 98.4 F (36.9 C)   Ht 5' 3"  (1.6 m)   Wt 187 lb (84.8 kg)   BMI 33.13 kg/m   General:  Well-nourished, well-developed in no acute distress.  Eyes: No icterus. Conjunctivae pink. Mouth: Oropharyngeal mucosa moist and pink , no lesions erythema or exudate. Lungs: Clear to auscultation bilaterally. Non-labored. Heart: Regular rate and rhythm, no murmurs rubs or gallops.  Abdomen: Bowel sounds are normal, nontender, nondistended, no hepatosplenomegaly or masses, no hernia , no rebound or guarding.   Rectal exam: Perianal exam revealed skin tags and noninflamed external hemorrhoids, nontender.  Digital rectal exam revealed tenderness in the posterior wall of anal canal consistent with anal fissure Extremities: No lower extremity edema. No clubbing or deformities. Neuro: Alert and oriented x 3.  Grossly intact. Skin: Warm and dry, no jaundice.   Psych: Alert and cooperative, normal mood and affect.   Imaging Studies: Reviewed  Assessment and Plan:   Victoria Pierce is a 38 y.o. female with history of bipolar, chronic GERD refractory to PPI status post Nissen fundoplication with repair of hiatal hernia is seen for follow-up of 2 weeks  history of constipation, symptomatic hemorrhoids  Constipation: Most likely combination of her eating habits and side effect of amitriptyline Decrease amitriptyline to 25 mg at bedtime Minimize intake of simple carbs Discussed with her about high-fiber diet, fiber supplement such as Metamucil MiraLAX daily  Anal fissure: Treat with 0.125% nitroglycerin plus lidocaine, instructions provided  Symptomatic external hemorrhoids Patient is interested in hemorrhoid ligation.  Will treat anal fissure and constipation before performing ligation   Follow up in 4 to 6 weeks   Dr Sherri Sear, MD

## 2019-01-13 ENCOUNTER — Other Ambulatory Visit: Payer: Self-pay | Admitting: Gastroenterology

## 2019-01-13 DIAGNOSIS — F411 Generalized anxiety disorder: Secondary | ICD-10-CM

## 2019-01-13 NOTE — Telephone Encounter (Signed)
Last office visit 12/20/2018 Constipation Anxiety anal fissures  Last refill 12/20/2018 2 refills

## 2019-01-29 ENCOUNTER — Telehealth: Payer: Self-pay | Admitting: *Deleted

## 2019-01-29 NOTE — Telephone Encounter (Signed)
Spoke with patient. She stated she is making loud gurgling sounds and unable to burp during meals and becomes nauseous from the bloating. Patient added to schedule after speaking with Dr.Pabon.

## 2019-01-29 NOTE — Telephone Encounter (Signed)
Patient had surgery with Dr.Pabon on 09/26/18 Laparoscopic Nissen Fundoplication, patient called and stated that she is still having constant gurgling under left rib cage, she stated that it is annoying and loud, and she can not burp when she eats. Please call and advise

## 2019-02-03 ENCOUNTER — Ambulatory Visit: Payer: Medicaid Other | Admitting: Gastroenterology

## 2019-02-03 ENCOUNTER — Other Ambulatory Visit: Payer: Self-pay

## 2019-02-03 ENCOUNTER — Encounter: Payer: Self-pay | Admitting: Gastroenterology

## 2019-02-03 VITALS — BP 115/83 | HR 99 | Temp 98.3°F | Resp 18 | Ht 63.0 in | Wt 192.2 lb

## 2019-02-03 DIAGNOSIS — K5904 Chronic idiopathic constipation: Secondary | ICD-10-CM

## 2019-02-03 DIAGNOSIS — Z9889 Other specified postprocedural states: Secondary | ICD-10-CM | POA: Diagnosis not present

## 2019-02-03 DIAGNOSIS — R142 Eructation: Secondary | ICD-10-CM

## 2019-02-03 DIAGNOSIS — K602 Anal fissure, unspecified: Secondary | ICD-10-CM

## 2019-02-03 NOTE — Progress Notes (Signed)
Cephas Darby, MD [address]  [place]  [place], [place] [address]  Main: [phone]  Fax: [phone] Pager: [phone]   Primary Care Physician: [name], MD  Primary Gastroenterologist:  Dr. Cephas Darby  Chief Complaint  Patient presents with  . Follow-up  . Hemorrhoids    HPI: Victoria Pierce is a 38 y.o. female with history of chronic refractory GERD, status post Nissen fundoplication, symptomatic hemorrhoids, IBS with diarrhea.  With regards to her GERD, patient decreased PPI to Protonix 40 mg once a day and feels significantly better after fundoplication.  However, she reports constant gurgling in her left upper quadrant.  For the last 2 weeks, her constipation has gotten severe associated with rectal bleeding on wiping, rectal discomfort, protrusion of hemorrhoids.  She continues to smoke cigarettes,  incorporate more starches in her diet, consumes red meat regularly.  She is currently on amitriptyline 50 mg at bedtime  Follow-up visit 02/03/2019 Patient continues to have difficulty burping while eating, ongoing gurgling sensation in left upper quadrant.  She says she can otherwise burp in between meals, sometimes has to forcibly regurgitate the food in order to alleviate the discomfort when she eats, has ongoing chest pain/pressure, dependent on Protonix 40 mg daily.  She also has constipation associated with straining, incomplete emptying, despite decreasing amitriptyline to 25 mg at bedtime.  She tried topical nitroglycerin for possible anal fissure, developed severe migraine headaches when she attempted to apply twice.  She continues to have stinging sensation during a bowel movement  Current Outpatient Medications  Medication Sig Dispense Refill  . albuterol (PROVENTIL HFA;VENTOLIN HFA) 108 (90 Base) MCG/ACT inhaler Inhale 2 puffs into the lungs every 6 (six) hours as needed for wheezing or shortness of breath.    . cariprazine (VRAYLAR) capsule  Take 1.5 mg by mouth 2 (two) times daily.    . cetirizine (ZYRTEC) 10 MG tablet Take 10 mg by mouth every morning.     . cyclobenzaprine (FLEXERIL) 10 MG tablet Take 10 mg by mouth 3 (three) times daily as needed for muscle spasms.     Marland Kitchen gabapentin (NEURONTIN) 300 MG capsule Take 900 mg by mouth 2 (two) times daily.     Marland Kitchen guanFACINE (INTUNIV) 1 MG TB24 ER tablet Take 1 mg by mouth every morning.     . hydroxypropyl methylcellulose / hypromellose (ISOPTO TEARS / GONIOVISC) 2.5 % ophthalmic solution Place 2 drops into both eyes 2 (two) times daily as needed for dry eyes. ARTIFICAL TEARS    . ketotifen ([employer] EYE ITCH RELIEF) 0.025 % ophthalmic solution Place 2 drops into both eyes 2 (two) times daily.     . Multiple Vitamin (MULTIVITAMIN WITH MINERALS) TABS tablet Take 1 tablet by mouth daily.    . naproxen (NAPROSYN) 500 MG tablet Take 500 mg by mouth 2 (two) times daily as needed for moderate pain.     Marland Kitchen ondansetron (ZOFRAN-ODT) 4 MG disintegrating tablet Take 1 tablet (4 mg total) by mouth every 6 (six) hours as needed for nausea or vomiting. 40 tablet 0  . promethazine (PHENERGAN) 25 MG tablet Take 25 mg by mouth every 6 (six) hours as needed for nausea or vomiting.    . propranolol (INDERAL) 10 MG tablet Take 10 mg by mouth 2 (two) times daily.    Marland Kitchen topiramate (TOPAMAX) 50 MG tablet Take 50 mg by mouth 2 (two) times daily.    . vitamin B-12 (CYANOCOBALAMIN) 1000 MCG tablet Take 1,000 mcg  by mouth daily.    Marland Kitchen amitriptyline (ELAVIL) 25 MG tablet Take 1 tablet (25 mg total) by mouth at bedtime. 30 tablet 2  . CHANTIX 0.5 MG tablet TAKE 1 TAB BY MOUTH EVERY DAY FOR 3 DAYS THEN TAKE 1 TAB TWICE A DAY FOR 4 DAYS (Patient not taking: Reported on 12/20/2018) 11 tablet 0  . pantoprazole (PROTONIX) 40 MG tablet TAKE 1 TABLET (40 MG TOTAL) BY MOUTH 2 (TWO) TIMES DAILY BEFORE A MEAL. 60 tablet 5  . PARoxetine (PAXIL) 10 MG tablet Take 1 tablet (10 mg total) by mouth daily. (Patient not taking: Reported on  12/04/2018) 30 tablet 11  . varenicline (CHANTIX CONTINUING MONTH PAK) 1 MG tablet Take 1 tablet (1 mg total) by mouth 2 (two) times daily. (Patient not taking: Reported on 12/20/2018) 60 tablet 5  . vitamin E (VITAMIN E) 400 UNIT capsule Take 1 capsule (400 Units total) by mouth daily. (Patient not taking: Reported on 02/03/2019) 30 capsule 11   Current Facility-Administered Medications  Medication Dose Route Frequency Provider Last Rate Last Dose  . levonorgestrel (MIRENA) 20 MCG/24HR IUD   Intrauterine Once Homero Fellers, MD        Allergies as of 02/03/2019  . (No Known Allergies)    NSAIDs: None  Antiplts/Anticoagulants/Anti thrombotics: None  GI procedures:  EGD 05/14/2018 - Normal duodenal bulb and second portion of the duodenum. Biopsied. - 4 cm hiatal hernia. - Erythematous mucosa in the gastric body and antrum. Biopsied. - Non-bleeding erosive gastropathy. - Esophagogastric landmarks identified. - Normal esophagus. Biopsied.  Colonoscopy 05/14/2018 - The examined portion of the ileum was normal. - Normal mucosa in the entire examined colon. Biopsied. - Diverticulosis in the sigmoid colon and in the descending colon. - The distal rectum and anal verge are normal on retroflexion view.  DIAGNOSIS:  A. DUODENUM; BIOPSY:  - FEW AREAS OF VILLOUS BLUNTING WITHOUT INCREASED INTRAEPITHELIAL  LYMPHOCYTES.  - SEE COMMENT.   B. STOMACH, RANDOM; BIOPSIES:  - BENIGN GASTRIC MUCOSA WITH FOCAL FIBROSIS IN LAMINA PROPRIA AND  REACTIVE/REGENERATIVE APPEARING GLANDS.  - NEGATIVE FOR ACTIVE MUCOSAL INFLAMMATION, H. PYLORI, INTESTINAL  METAPLASIA AND DYSPLASIA.  - SEE COMMENT.   C. ESOPHAGUS; BIOPSY:  - NO SIGNIFICANT PATHOLOGIC FEATURES.   D. COLON, RANDOM; BIOPSIES:  - NEGATIVE FOR ACTIVE MUCOSAL INFLAMMATION, GRANULOMAS AND  LYMPHOCYTIC/MICROSCOPIC COLITIS.  - FEATURES OF IDIOPATHIC CHRONIC INFLAMMATORY BOWEL DISEASE (ULCERATIVE  COLITIS) ARE NOT SEEN.  - NO  DYSPLASIA OR MALIGNANCY IDENTIFIED.  ROS:  General: Negative for anorexia, weight loss, fever, chills, fatigue, weakness. ENT: Negative for hoarseness, difficulty swallowing , nasal congestion. CV: Negative for chest pain, angina, palpitations, dyspnea on exertion, peripheral edema.  Respiratory: Negative for dyspnea at rest, dyspnea on exertion, cough, sputum, wheezing.  GI: See history of present illness. GU:  Negative for dysuria, hematuria, urinary incontinence, urinary frequency, nocturnal urination.  Endo: Negative for unusual weight change.    Physical Examination:   BP 115/83 (BP Location: Left Arm, Patient Position: Sitting, Cuff Size: Large)   Pulse 99   Temp 98.3 F (36.8 C)   Resp 18   Ht 5' 3"  (1.6 m)   Wt 192 lb 3.2 oz (87.2 kg)   BMI 34.05 kg/m   General: Well-nourished, well-developed in no acute distress.  Eyes: No icterus. Conjunctivae pink. Mouth: Oropharyngeal mucosa moist and pink , no lesions erythema or exudate. Lungs: Clear to auscultation bilaterally. Non-labored. Heart: Regular rate and rhythm, no murmurs rubs or gallops.  Abdomen: Bowel  sounds are normal, nontender, nondistended, no hepatosplenomegaly or masses, no hernia , no rebound or guarding.   Rectal exam: Perianal exam revealed skin tags and noninflamed external hemorrhoids, nontender.  Digital rectal exam revealed tenderness in the posterior wall of anal canal consistent with anal fissure Extremities: No lower extremity edema. No clubbing or deformities. Neuro: Alert and oriented x 3.  Grossly intact. Skin: Warm and dry, no jaundice.   Psych: Alert and cooperative, normal mood and affect.   Imaging Studies: Reviewed  Assessment and Plan:   Victoria Pierce is a 38 y.o. female with history of bipolar, chronic GERD refractory to PPI status post Nissen fundoplication with repair of hiatal hernia is seen for follow-up of constipation, unable to burp while eating, gurgling in left upper  quadrant  S/p Nissen's fundoplication Unable to burp which started after fundoplication Patient has follow-up appointment to see Dr. Perrin Maltese on 10/29 Asked her to message me via MyChart to schedule upper endoscopy after meeting with Dr. Perrin Maltese, to evaluate if the wrap is tight and she may benefit from empiric esophageal dilation, other differentials include aerophagia Advised her to completely eliminate soft drinks, artificial sweeteners, creamer in coffee Continue Protonix 40 mg daily for now  Chronic constipation:  Minimize intake of simple carbs Continue high-fiber diet, fiber supplement such as Metamucil MiraLAX daily Trial of Amitiza 8 MCG 2 times a day, samples provided Highly encouraged to quit smoking  Anal fissure: We will try 0.3% nifedipine with lidocaine   Follow up in 6 weeks   Dr Sherri Sear, MD

## 2019-02-10 ENCOUNTER — Encounter: Payer: Self-pay | Admitting: Surgery

## 2019-02-10 ENCOUNTER — Other Ambulatory Visit: Payer: Self-pay

## 2019-02-10 ENCOUNTER — Ambulatory Visit: Payer: Medicaid Other | Admitting: Surgery

## 2019-02-10 VITALS — BP 112/77 | HR 90 | Temp 97.7°F | Resp 14 | Ht 63.0 in | Wt 192.0 lb

## 2019-02-10 DIAGNOSIS — R17 Unspecified jaundice: Secondary | ICD-10-CM

## 2019-02-10 DIAGNOSIS — R1010 Upper abdominal pain, unspecified: Secondary | ICD-10-CM

## 2019-02-10 NOTE — Patient Instructions (Addendum)
Patient has been scheduled for a CT abdomen/pelvis with/without contrast at [facility] for 10:00 am  (arrive: 9:45am). Prep: NPO 4 hours prior and pick up prep kit. Patient verbalizes understanding.   [facility]  [address] [place], [place] [address] [phone]   Please call our office if you have questions or concerns.

## 2019-02-10 NOTE — Progress Notes (Signed)
Outpatient Surgical Follow Up  02/10/2019  Victoria Pierce is an 38 y.o. female.   Chief Complaint  Patient presents with  . Routine Post Op    Nisen Fundoplication 6/80/32 Dr.    HPI:   Victoria Pierce is a 38 year old female well-known to me with a prior history of robotic fundoplication on June 1224.  Her main issue was recalcitrant reflux.  From a reflux perspective she is doing much better.  She does have a history of anxiety, bipolar disorder.  Now comes complaining of upper abdominal pain.  The pain is colicky and intermittent.  Pain seems to worsen after she eats.  She is still continues to drink soft drinks. Also of note the pain seems to be located within the chest wall as well.  No fevers no chills.  She also complains of gurgling sounds that are loud.  She has a hard time burping.  She was seen by Dr. Marius Ditch and was given counseling as well as an option for EGD. She does not report dysphagia.    Past Medical History:  Diagnosis Date  . Anxiety   . Asthma   . Bipolar 1 disorder ([place])   . Depression   . Diverticulitis   . GERD (gastroesophageal reflux disease)   . Hernia, abdominal   . Ulcerative colitis   . Vaginal septum     Past Surgical History:  Procedure Laterality Date  . CLOSED REDUCTION METACARPAL WITH PERCUTANEOUS PINNING Left 03/07/2018   Procedure: CLOSED REDUCTION METACARPAL WITH PERCUTANEOUS PINNING-5th metacarpal;  Surgeon: Corky Mull, MD;  Location: [facility] ORS;  Service: Orthopedics;  Laterality: Left;  . COLONOSCOPY WITH PROPOFOL N/A 05/14/2018   Procedure: COLONOSCOPY WITH PROPOFOL;  Surgeon: Lin Landsman, MD;  Location: [facility] ENDOSCOPY;  Service: Gastroenterology;  Laterality: N/A;  . ESOPHAGOGASTRODUODENOSCOPY (EGD) WITH PROPOFOL N/A 05/14/2018   Procedure: ESOPHAGOGASTRODUODENOSCOPY (EGD) WITH PROPOFOL;  Surgeon: Lin Landsman, MD;  Location: [facility] ENDOSCOPY;  Service: Gastroenterology;  Laterality: N/A;  . HARDWARE REMOVAL Left  04/03/2018   Procedure: LEFT HAND FIFTH METACARPAL PIN REMOVAL;  Surgeon: Corky Mull, MD;  Location: [place];  Service: Orthopedics;  Laterality: Left;  . ROBOTIC ASSISTED LAPAROSCOPIC NISSEN FUNDOPLICATION N/A 12/10/35   Procedure: ROBOTIC ASSISTED LAPAROSCOPIC NISSEN FUNDOPLICATION;  Surgeon: Jules Husbands, MD;  Location: [facility] ORS;  Service: General;  Laterality: N/A;  . TRIGGER FINGER RELEASE Left 06/19/2018   Procedure: EXTENSOR TENOLYSIS WITH CAPSULAR RELEASE OF LEFT LITTLE MCP JOINT;  Surgeon: Corky Mull, MD;  Location: [place];  Service: Orthopedics;  Laterality: Left;  . TUBAL LIGATION      Family History  Problem Relation Age of Onset  . Hypertension Sister     Social History:  reports that she has been smoking cigarettes. She has a 27.00 pack-year smoking history. She has never used smokeless tobacco. She reports current drug use. Drug: Marijuana. She reports that she does not drink alcohol.  Allergies: No Known Allergies  Medications reviewed.    ROS Full ROS performed and is otherwise negative other than what is stated in HPI   BP 112/77   Pulse 90   Temp 97.7 F (36.5 C) (Temporal)   Resp 14   Ht 5' 3"  (1.6 m)   Wt 192 lb (87.1 kg)   SpO2 97%   BMI 34.01 kg/m   Physical Exam Vitals signs and nursing note reviewed. Exam conducted with a chaperone present.  Constitutional:      General: She is not  in acute distress.    Appearance: She is normal weight.  Eyes:     General: No scleral icterus.       Right eye: No discharge.        Left eye: No discharge.  Neck:     Musculoskeletal: Normal range of motion and neck supple. No neck rigidity.  Cardiovascular:     Rate and Rhythm: Normal rate and regular rhythm.     Heart sounds: No murmur.  Pulmonary:     Effort: Pulmonary effort is normal. No respiratory distress.     Breath sounds: No stridor. No wheezing or rhonchi.     Comments: There is tenderness to palpation in the left  chest wall Abdominal:     General: Abdomen is flat. There is no distension.     Palpations: There is no mass.     Tenderness: There is abdominal tenderness. There is no guarding.     Hernia: No hernia is present.     Comments: Mild tenderness to palpation in the upper abdomen no peritonitis.  Previous robotic scars, no evidence complications  Skin:    General: Skin is warm.     Capillary Refill: Capillary refill takes less than 2 seconds.  Neurological:     General: No focal deficit present.     Mental Status: She is alert and oriented to person, place, and time.  Psychiatric:        Mood and Affect: Mood normal.        Behavior: Behavior normal.        Thought Content: Thought content normal.        Judgment: Judgment normal.      Assessment/Plan: 38 year old female with an complex history and some abdominal pain.  Sure if this is due to the gas and the inability for her to burp.  She does not have dysphagia and I doubt that the wrap is too tight but I do think that an EGD is indicated given her persistent symptoms.  I will also obtain a CT scan of the abdomen and pelvis to make sure there is no other acute intra-abdominal pathology that we need to address.  No need for any surgical intervention at this time   Greater than 50% of the 25 minutes  visit was spent in counseling/coordination of care   Caroleen Hamman, MD [facility]

## 2019-02-14 ENCOUNTER — Other Ambulatory Visit: Payer: Self-pay

## 2019-02-14 ENCOUNTER — Ambulatory Visit
Admission: RE | Admit: 2019-02-14 | Discharge: 2019-02-14 | Disposition: A | Discharge status: Home / Self Care | Payer: Medicaid Other | Source: Ambulatory Visit | Attending: Surgery | Admitting: Surgery

## 2019-02-14 DIAGNOSIS — R1010 Upper abdominal pain, unspecified: Secondary | ICD-10-CM | POA: Diagnosis not present

## 2019-02-14 MED ORDER — IOHEXOL 300 MG/ML  SOLN
100.0000 mL | Freq: Once | INTRAMUSCULAR | Status: AC | PRN
  Administered 2019-02-14: 100 mL via INTRAVENOUS

## 2019-02-18 ENCOUNTER — Telehealth: Payer: Self-pay

## 2019-02-18 NOTE — Telephone Encounter (Signed)
Left message for patient to return call to office regarding CT results.

## 2019-02-18 NOTE — Telephone Encounter (Signed)
Spoke with patient . Appointment made 02/24/2019.

## 2019-02-24 ENCOUNTER — Ambulatory Visit: Payer: Medicaid Other | Attending: Family Medicine | Admitting: Occupational Therapy

## 2019-02-24 ENCOUNTER — Other Ambulatory Visit: Payer: Self-pay

## 2019-02-24 ENCOUNTER — Ambulatory Visit (INDEPENDENT_AMBULATORY_CARE_PROVIDER_SITE_OTHER): Payer: Medicaid Other | Admitting: Gastroenterology

## 2019-02-24 ENCOUNTER — Encounter: Payer: Self-pay | Admitting: Surgery

## 2019-02-24 ENCOUNTER — Telehealth: Payer: Self-pay | Admitting: Gastroenterology

## 2019-02-24 ENCOUNTER — Ambulatory Visit (INDEPENDENT_AMBULATORY_CARE_PROVIDER_SITE_OTHER): Payer: Medicaid Other | Admitting: Surgery

## 2019-02-24 VITALS — BP 128/87 | HR 106 | Temp 97.7°F | Resp 14 | Ht 63.0 in | Wt 191.0 lb

## 2019-02-24 DIAGNOSIS — K219 Gastro-esophageal reflux disease without esophagitis: Secondary | ICD-10-CM

## 2019-02-24 DIAGNOSIS — K5904 Chronic idiopathic constipation: Secondary | ICD-10-CM

## 2019-02-24 DIAGNOSIS — K449 Diaphragmatic hernia without obstruction or gangrene: Secondary | ICD-10-CM | POA: Diagnosis not present

## 2019-02-24 DIAGNOSIS — R1310 Dysphagia, unspecified: Secondary | ICD-10-CM

## 2019-02-24 DIAGNOSIS — M25642 Stiffness of left hand, not elsewhere classified: Secondary | ICD-10-CM

## 2019-02-24 DIAGNOSIS — R1319 Other dysphagia: Secondary | ICD-10-CM

## 2019-02-24 MED ORDER — LUBIPROSTONE 8 MCG PO CAPS: 8.0000 ug | ORAL_CAPSULE | Freq: Two times a day (BID) | ORAL | 2 refills | Status: DC

## 2019-02-24 MED ORDER — METOCLOPRAMIDE HCL 5 MG PO TABS: 5.0000 mg | ORAL_TABLET | Freq: Three times a day (TID) | ORAL | 0 refills | Status: DC | PRN

## 2019-02-24 NOTE — Telephone Encounter (Signed)
Left vm to offer apt in 2 month with Dr. Marius Ditch

## 2019-02-24 NOTE — Therapy (Signed)
[facility] [address] [place], [address] Phone: [phone]   Fax:  [phone]  Occupational Therapy Screen  Patient Details  Name: Victoria Pierce MRN: [id number] Date of Birth: [birth year] Referring Provider (OT): Poggi   Encounter Date: 02/24/2019    Past Medical History:  Diagnosis Date  . Anxiety   . Asthma   . Bipolar 1 disorder ([place])   . Depression   . Diverticulitis   . GERD (gastroesophageal reflux disease)   . Hernia, abdominal   . Ulcerative colitis   . Vaginal septum     Past Surgical History:  Procedure Laterality Date  . CLOSED REDUCTION METACARPAL WITH PERCUTANEOUS PINNING Left 03/07/2018   Procedure: CLOSED REDUCTION METACARPAL WITH PERCUTANEOUS PINNING-5th metacarpal;  Surgeon: Corky Mull, MD;  Location: [facility] ORS;  Service: Orthopedics;  Laterality: Left;  . COLONOSCOPY WITH PROPOFOL N/A 05/14/2018   Procedure: COLONOSCOPY WITH PROPOFOL;  Surgeon: Lin Landsman, MD;  Location[facility]COPY;  Service: Gastroenterology;  Laterality: N/A;  . ESOPHAGOGASTRODUODENOSCOPY (EGD) WITH PROPOFOL N/A 05/14/2018   Procedure: ESOPHAGOGASTRODUODENOSCOPY (EGD) WITH PROPOFOL;  Surgeon: Lin Landsman, MD;  Location: [facility] ENDOSCOPY;  Service: Gastroenterology;  Laterality: N/A;  . HARDWARE REMOVAL Left 04/03/2018   Procedure: LEFT HAND FIFTH METACARPAL PIN REMOVAL;  Surgeon: Corky Mull, MD;  Location: [facility];  Service: Orthopedics;  Laterality: Left;  . ROBOTIC ASSISTED LAPAROSCOPIC NISSEN FUNDOPLICATION N/A 8/36/6294   Procedure: ROBOTIC ASSISTED LAPAROSCOPIC NISSEN FUNDOPLICATION;  Surgeon: Jules Husbands, MD;  Location: [facility] ORS;  Service: General;  Laterality: N/A;  . TRIGGER FINGER RELEASE Left 06/19/2018   Procedure: EXTENSOR TENOLYSIS WITH CAPSULAR RELEASE OF LEFT LITTLE MCP JOINT;  Surgeon: Corky Mull, MD;  Location: [facility];  Service:  Orthopedics;  Laterality: Left;  . TUBAL LIGATION      There were no vitals filed for this visit.  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  Yes    Pain Score  7     Pain Location  Hand    Pain Orientation  Left    Pain Descriptors / Indicators  Aching    Pain Type  Surgical pain    Aggravating Factors   making tight fist -and 4thand 5th knucle         [facility] OT Assessment - 02/24/19 0001      Strength   Right Hand Grip (lbs)  78    Right Hand Lateral Pinch  17 lbs    Right Hand 3 Point Pinch  23 lbs    Left Hand Grip (lbs)  65    Left Hand Lateral Pinch  17 lbs    Left Hand 3 Point Pinch  22 lbs      Left Hand AROM   L Ring  MCP 0-90  90 Degrees    L Ring PIP 0-100  98 Degrees   -32   L Little  MCP 0-90  82 Degrees    L Little PIP 0-100  98 Degrees   -32      Pt arrive after being seen last time in June 2020 - she is now in Health and safety inspector school and report having hard time holding things to cut  and her fingers wants to curl in on 4th and 5th  Upon measurements her flexion of digits 4th and 5th improve greatly and grip and prehension also increase on L   but  Her extention of 4th and 5th PIP got worse  Pt ed on using her paraffin with PIP extention LMB splints on  4th and 5th - to increase extention and to do soft tissue massage on volar 4thand 5th  Prior to PROM , and AAROM   Showed increase extention to -25 and -28 after paraffin and after massage -5th -5 degrees and 4th -20 PROM  Pt to do HEP for 3-4 wks and follow up                      OT Short Term Goals - 09/19/18 0847      OT SHORT TERM GOAL #1   Title  Pt to be ind in HEP to decrease edema, pain and increase AROM in 4th ad 5th digit     Status  Achieved    Target Date  07/23/18      OT SHORT TERM  GOAL #2   Title  AROM in L 4thand 5th digit improve for pt to grip 2 cm cylinder - brush, knife     Status  Achieved    Target Date  07/23/18        OT Long Term Goals - 09/19/18 0847      OT LONG TERM GOAL #1   Title  Pain on PRWHE improve with more than 12 points     Baseline   PRWHE  for pain 46/50  and now 27/50 ( 08/15/18) and now 10/50    Status  Achieved    Target Date  09/19/18      OT LONG TERM GOAL #2   Title  AROM in 4thand 5th improve for pt to touch palm to hold small object in palm     Status  Achieved    Target Date  09/19/18      OT LONG TERM GOAL #3   Title  Grip strength in L improve to more than 50% to carry more than 5 lbs, cut with knife ,and squeeze washcloth     Baseline  83 lbs R , L 61 lbs     Status  Achieved    Target Date  09/19/18      OT LONG TERM GOAL #4   Title  Extention of PIP's of 4th and 5th improve to less than -25 degrees to be able to donn gloves or reach into pocket     Baseline  PIP of 5th -25 and 4th -20     Status  Achieved    Target Date  09/19/18              Patient will benefit from skilled therapeutic intervention in order to improve the following deficits and impairments:           Visit Diagnosis: Stiffness of left hand, not elsewhere classified    Problem List Patient Active Problem List   Diagnosis Date Noted  . S/P Nissen fundoplication (without gastrostomy tube) procedure 09/26/2018  . Chronic pelvic pain in female 06/14/2018  . Dysmenorrhea 06/14/2018  . Menorrhagia with regular cycle 06/14/2018  . Contracture of finger joint, 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  . Closed displaced fracture of neck of fifth metacarpal bone of left hand 03/07/2018  . GERD (gastroesophageal reflux disease)   . Anxiety  Rosalyn Gess OTR/L,CLT 02/24/2019, 10:17 AM  [facility] [address] [place], [address] Phone: [phone]   Fax:   [phone]  Name: AILANY KOREN MRN: [id number] Date of Birth: 12-07-1980

## 2019-02-24 NOTE — Progress Notes (Signed)
Sherri Sear, MD [address]  [place]  [place], [place] [address]  Main: [phone]  Fax: [phone]    Gastroenterology Consultation Video Visit  Referring Provider:     [name], MD Primary Care Physician:  [name], MD Primary Gastroenterologist:  Dr. Cephas Darby Reason for Consultation:   Follow-up of GERD and chronic constipation        HPI:   Victoria Pierce is a 38 y.o. female referred by Dr. [name], MD  for consultation & management of chronic GERD and chronic constipation  Virtual Visit Video Note  I connected with Victoria Pierce on 02/24/19 at  1:00 PM EST by video and verified that I am speaking with the correct person using two identifiers.   I discussed the limitations, risks, security and privacy concerns of performing an evaluation and management service by video and the availability of in person appointments. I also discussed with the patient that there may be a patient responsible charge related to this service. The patient expressed understanding and agreed to proceed.  Location of the Patient: Home  Location of the provider: Office  Persons participating in the visit: Patient and provider   History of Present Illness: Victoria Pierce has been seeing me for chronic GERD and chronic constipation.  She underwent Nissen's fundoplication.  She continues to experience gas bubble in her upper stomach and unable to burp.  She saw Dr. Perrin Maltese on 02/14/2019, recommended CT abdomen and pelvis with contrast which showed small hiatal hernia and possible gastritis and duodenitis.  Patient is worried about the presence of hiatal hernia that was seen on the CT scan and she was wondering how it recurred so soon. She has appointment to see Dr. Perrin Maltese this afternoon to discuss further steps.  With regards to constipation, she reports that Amitiza 8 MCG samples that were given from my office are helping and she is trying to incorporate more  fiber in her diet.  She likes to have a prescription of Amitiza.  She has not tried topical nifedipine for anal fissure but tells me that her rectal discomfort is overall better.    NSAIDs: None  Antiplts/Anticoagulants/Anti thrombotics: None  GI procedures:  EGD 05/14/2018 - Normal duodenal bulb and second portion of the duodenum. Biopsied. - 4 cm hiatal hernia. - Erythematous mucosa in the gastric body and antrum. Biopsied. - Non-bleeding erosive gastropathy. - Esophagogastric landmarks identified. - Normal esophagus. Biopsied.  Colonoscopy 05/14/2018 - The examined portion of the ileum was normal. - Normal mucosa in the entire examined colon. Biopsied. - Diverticulosis in the sigmoid colon and in the descending colon. - The distal rectum and anal verge are normal on retroflexion view.  DIAGNOSIS:  A. DUODENUM; BIOPSY:  - FEW AREAS OF VILLOUS BLUNTING WITHOUT INCREASED INTRAEPITHELIAL  LYMPHOCYTES.  - SEE COMMENT.   B. STOMACH, RANDOM; BIOPSIES:  - BENIGN GASTRIC MUCOSA WITH FOCAL FIBROSIS IN LAMINA PROPRIA AND  REACTIVE/REGENERATIVE APPEARING GLANDS.  - NEGATIVE FOR ACTIVE MUCOSAL INFLAMMATION, H. PYLORI, INTESTINAL  METAPLASIA AND DYSPLASIA.  - SEE COMMENT.   C. ESOPHAGUS; BIOPSY:  - NO SIGNIFICANT PATHOLOGIC FEATURES.   D. COLON, RANDOM; BIOPSIES:  - NEGATIVE FOR ACTIVE MUCOSAL INFLAMMATION, GRANULOMAS AND  LYMPHOCYTIC/MICROSCOPIC COLITIS.  - FEATURES OF IDIOPATHIC CHRONIC INFLAMMATORY BOWEL DISEASE (ULCERATIVE  COLITIS) ARE NOT SEEN.  - NO DYSPLASIA OR MALIGNANCY IDENTIFIED.  Past Medical History:  Diagnosis Date  . Anxiety   . Asthma   . Bipolar  1 disorder ([place])   . Depression   . Diverticulitis   . GERD (gastroesophageal reflux disease)   . Hernia, abdominal   . Ulcerative colitis   . Vaginal septum     Past Surgical History:  Procedure Laterality Date  . CLOSED REDUCTION METACARPAL WITH PERCUTANEOUS PINNING Left 03/07/2018   Procedure: CLOSED  REDUCTION METACARPAL WITH PERCUTANEOUS PINNING-5th metacarpal;  Surgeon: Corky Mull, MD;  Location: [facility];  Service: Orthopedics;  Laterality: Left;  . COLONOSCOPY WITH PROPOFOL N/A 05/14/2018   Procedure: COLONOSCOPY WITH PROPOFOL;  Surgeon: Lin Landsman, MD;  Location: [facility] ENDOSCOPY;  Service: Gastroenterology;  Laterality: N/A;  . ESOPHAGOGASTRODUODENOSCOPY (EGD) WITH PROPOFOL N/A 05/14/2018   Procedure: ESOPHAGOGASTRODUODENOSCOPY (EGD) WITH PROPOFOL;  Surgeon: Lin Landsman, MD;  Location: [facility] ENDOSCOPY;  Service: Gastroenterology;  Laterality: N/A;  . HARDWARE REMOVAL Left 04/03/2018   Procedure: LEFT HAND FIFTH METACARPAL PIN REMOVAL;  Surgeon: Corky Mull, MD;  Location: [place];  Service: Orthopedics;  Laterality: Left;  . ROBOTIC ASSISTED LAPAROSCOPIC NISSEN FUNDOPLICATION N/A 8/76/8115   Procedure: ROBOTIC ASSISTED LAPAROSCOPIC NISSEN FUNDOPLICATION;  Surgeon: Jules Husbands, MD;  Location: [facility];  Service: General;  Laterality: N/A;  . TRIGGER FINGER RELEASE Left 06/19/2018   Procedure: EXTENSOR TENOLYSIS WITH CAPSULAR RELEASE OF LEFT LITTLE MCP JOINT;  Surgeon: Corky Mull, MD;  Location: [place];  Service: Orthopedics;  Laterality: Left;  . TUBAL LIGATION      Current Outpatient Medications:  .  albuterol (PROVENTIL HFA;VENTOLIN HFA) 108 (90 Base) MCG/ACT inhaler, Inhale 2 puffs into the lungs every 6 (six) hours as needed for wheezing or shortness of breath., Disp: , Rfl:  .  cariprazine (VRAYLAR) capsule, Take 1.5 mg by mouth 2 (two) times daily., Disp: , Rfl:  .  cetirizine (ZYRTEC) 10 MG tablet, Take 10 mg by mouth every morning. , Disp: , Rfl:  .  cyclobenzaprine (FLEXERIL) 10 MG tablet, Take 10 mg by mouth 3 (three) times daily as needed for muscle spasms. , Disp: , Rfl:  .  gabapentin (NEURONTIN) 300 MG capsule, Take 900 mg by mouth 2 (two) times daily. , Disp: , Rfl:  .  guanFACINE (INTUNIV) 1 MG TB24 ER tablet, Take 1 mg by  mouth every morning. , Disp: , Rfl:  .  hydroxypropyl methylcellulose / hypromellose (ISOPTO TEARS / GONIOVISC) 2.5 % ophthalmic solution, Place 2 drops into both eyes 2 (two) times daily as needed for dry eyes. ARTIFICAL TEARS, Disp: , Rfl:  .  ketotifen ([employer] EYE ITCH RELIEF) 0.025 % ophthalmic solution, Place 2 drops into both eyes 2 (two) times daily. , Disp: , Rfl:  .  Multiple Vitamin (MULTIVITAMIN WITH MINERALS) TABS tablet, Take 1 tablet by mouth daily., Disp: , Rfl:  .  naproxen (NAPROSYN) 500 MG tablet, Take 500 mg by mouth 2 (two) times daily as needed for moderate pain. , Disp: , Rfl:  .  ondansetron (ZOFRAN-ODT) 4 MG disintegrating tablet, Take 1 tablet (4 mg total) by mouth every 6 (six) hours as needed for nausea or vomiting., Disp: 40 tablet, Rfl: 0 .  promethazine (PHENERGAN) 25 MG tablet, Take 25 mg by mouth every 6 (six) hours as needed for nausea or vomiting., Disp: , Rfl:  .  propranolol (INDERAL) 10 MG tablet, Take 10 mg by mouth 2 (two) times daily., Disp: , Rfl:  .  topiramate (TOPAMAX) 50 MG tablet, Take 50 mg by mouth 2 (two) times daily., Disp: , Rfl:  .  vitamin B-12 (CYANOCOBALAMIN) 1000 MCG tablet, Take 1,000 mcg by mouth daily., Disp: , Rfl:  .  amitriptyline (ELAVIL) 25 MG tablet, Take 1 tablet (25 mg total) by mouth at bedtime., Disp: 30 tablet, Rfl: 2 .  lubiprostone (AMITIZA) 8 MCG capsule, Take 1 capsule (8 mcg total) by mouth 2 (two) times daily with a meal., Disp: 60 capsule, Rfl: 2 .  pantoprazole (PROTONIX) 40 MG tablet, TAKE 1 TABLET (40 MG TOTAL) BY MOUTH 2 (TWO) TIMES DAILY BEFORE A MEAL., Disp: 60 tablet, Rfl: 5  Current Facility-Administered Medications:  .  levonorgestrel (MIRENA) 20 MCG/24HR IUD, , Intrauterine, Once, Schuman, Christanna R, MD   Family History  Problem Relation Age of Onset  . Hypertension Sister      Social History   Tobacco Use  . Smoking status: Current Every Day Smoker    Packs/day: 1.00    Years: 27.00    Pack years:  27.00    Types: Cigarettes  . Smokeless tobacco: Never Used  Substance Use Topics  . Alcohol use: No  . Drug use: Yes    Types: Marijuana    Allergies as of 02/24/2019  . (No Known Allergies)     Imaging Studies: Reviewed  Assessment and Plan:   CARAH BARRIENTES is a 38 y.o. female with history of bipolar,chronic GERD refractory to PPI status post Nissen fundoplication with repair of hiatal hernia  in 06/2018 is seen for follow-up of chronic GERD, chronic constipation, symptomatic hemorrhoids  Chronic GERD s/p Nissen's fundoplication Currently on Protonix 40 mg once daily CT revealed small hiatal hernia, to discuss next steps with Dr. Perrin Maltese Discussed with her about possibility of repeat EGD after seeing Dr. Perrin Maltese today.  If EGD is unremarkable, I recommend pH impedance and esophageal manometry   Chronic constipation Continue Amitiza 8 MCG twice daily High-fiber diet    Follow Up Instructions:   I discussed the assessment and treatment plan with the patient. The patient was provided an opportunity to ask questions and all were answered. The patient agreed with the plan and demonstrated an understanding of the instructions.   The patient was advised to call back or seek an in-person evaluation if the symptoms worsen or if the condition fails to improve as anticipated.  I provided 12 minutes of face-to-face time during this encounter.   Follow up in 2 months   Cephas Darby, MD

## 2019-02-24 NOTE — Patient Instructions (Addendum)
Please speak with your Gastroenterologist about stretching your esophagus. Please pick up your medication at the pharmacy.   Please call our office if you have questions or concerns.

## 2019-02-25 ENCOUNTER — Telehealth: Payer: Self-pay | Admitting: Gastroenterology

## 2019-02-25 ENCOUNTER — Encounter: Payer: Self-pay | Admitting: Gastroenterology

## 2019-02-25 NOTE — Telephone Encounter (Signed)
Pt is  Calling she would like Dr. Marius Ditch  To review her notes from Dr. Dahlia Byes  visit and call pt please

## 2019-02-26 ENCOUNTER — Encounter: Payer: Self-pay | Admitting: Surgery

## 2019-02-26 NOTE — Progress Notes (Signed)
Outpatient Surgical Follow Up  02/26/2019  [place] Pierce is an 38 y.o. female.   Chief Complaint  Patient presents with  . Follow-up    Nissen fundoplication [id number] Ct scan    HPI: [place] Pierce is a 38 year old female with past medical history significant for bipolar disorder, anxiety and chronic pain I did perform a Nissen fundoplication for recalcitrant reflux on June 2020.  She did well for a few months and now more recently is reporting significant gurgling after eating.  She does have some mild dysphagia for certain solid meals. She denies any fevers any chills.  I did perform a CT scan that I have personally reviewed showing a very small hiatal hernia.  The wrap is intact.  There is no evidence of perforation.  There is no evidence of obstruction.  Overall underwhelming findings. She reports significant high-pitched gurgling.  She is able to have bowel movements.  Past Medical History:  Diagnosis Date  . Anxiety   . Asthma   . Bipolar 1 disorder ([place])   . Depression   . Diverticulitis   . GERD (gastroesophageal reflux disease)   . Hernia, abdominal   . Ulcerative colitis   . Vaginal septum     Past Surgical History:  Procedure Laterality Date  . CLOSED REDUCTION METACARPAL WITH PERCUTANEOUS PINNING Left 03/07/2018   Procedure: CLOSED REDUCTION METACARPAL WITH PERCUTANEOUS PINNING-5th metacarpal;  Surgeon: Corky Mull, MD;  Location: [facility];  Service: Orthopedics;  Laterality: Left;  . COLONOSCOPY WITH PROPOFOL N/A 05/14/2018   Procedure: COLONOSCOPY WITH PROPOFOL;  Surgeon: Lin Landsman, MD;  Location: [facility] ENDOSCOPY;  Service: Gastroenterology;  Laterality: N/A;  . ESOPHAGOGASTRODUODENOSCOPY (EGD) WITH PROPOFOL N/A 05/14/2018   Procedure: ESOPHAGOGASTRODUODENOSCOPY (EGD) WITH PROPOFOL;  Surgeon: Lin Landsman, MD;  Location: [facility] ENDOSCOPY;  Service: Gastroenterology;  Laterality: N/A;  . HARDWARE REMOVAL Left 04/03/2018   Procedure: LEFT HAND  FIFTH METACARPAL PIN REMOVAL;  Surgeon: Corky Mull, MD;  Location: [place];  Service: Orthopedics;  Laterality: Left;  . ROBOTIC ASSISTED LAPAROSCOPIC NISSEN FUNDOPLICATION N/A 10/01/735   Procedure: ROBOTIC ASSISTED LAPAROSCOPIC NISSEN FUNDOPLICATION;  Surgeon: Jules Husbands, MD;  Location: [facility];  Service: General;  Laterality: N/A;  . TRIGGER FINGER RELEASE Left 06/19/2018   Procedure: EXTENSOR TENOLYSIS WITH CAPSULAR RELEASE OF LEFT LITTLE MCP JOINT;  Surgeon: Corky Mull, MD;  Location: [place];  Service: Orthopedics;  Laterality: Left;  . TUBAL LIGATION      Family History  Problem Relation Age of Onset  . Hypertension Sister     Social History:  reports that she has been smoking cigarettes. She has a 27.00 pack-year smoking history. She has never used smokeless tobacco. She reports current drug use. Drug: Marijuana. She reports that she does not drink alcohol.  Allergies: No Known Allergies  Medications reviewed.    ROS Full ROS performed and is otherwise negative other than what is stated in HPI   BP 128/87   Pulse (!) 106   Temp 97.7 F (36.5 C) (Temporal)   Resp 14   Ht 5' 3"  (1.6 m)   Wt 191 lb (86.6 kg)   LMP 02/14/2019 (Exact Date)   SpO2 98%   BMI 33.83 kg/m   Physical Exam Vitals signs and nursing note reviewed. Exam conducted with a chaperone present.  Constitutional:      General: She is not in acute distress.    Appearance: Normal appearance. She is not ill-appearing.  Neck:  Musculoskeletal: Normal range of motion and neck supple. No neck rigidity.  Cardiovascular:     Rate and Rhythm: Normal rate and regular rhythm.  Pulmonary:     Effort: Pulmonary effort is normal. No respiratory distress.     Breath sounds: No wheezing.  Abdominal:     General: Abdomen is flat. There is no distension.     Palpations: Abdomen is soft. There is no mass.     Tenderness: There is no abdominal tenderness. There is no guarding or  rebound.     Hernia: No hernia is present.  Lymphadenopathy:     Cervical: No cervical adenopathy.  Neurological:     General: No focal deficit present.     Mental Status: She is alert and oriented to person, place, and time.  Psychiatric:        Mood and Affect: Mood normal.        Behavior: Behavior normal.        Thought Content: Thought content normal.        Judgment: Judgment normal.       Assessment/Plan: [facility] Pierce is 5 months out after robotic Nissen fundoplication.  She has some dysphagia for certain solid foods.  CT not overly impressive.  No evidence of perforation or complication.  Discussed with patient detail I will start some Reglan.  I will also request an EGD by Dr. Marius Ditch potentially an esophageal dilation if indicated.  I will be happy to see her in a few months.  She does have other significant comorbidities including chronic pain and anxiety.   Greater than 50% of the 25 minutes  visit was spent in counseling/coordination of care   Caroleen Hamman, MD [facility]

## 2019-02-27 ENCOUNTER — Other Ambulatory Visit: Payer: Self-pay

## 2019-02-27 DIAGNOSIS — K219 Gastro-esophageal reflux disease without esophagitis: Secondary | ICD-10-CM

## 2019-02-27 NOTE — Telephone Encounter (Signed)
Procedure has been scheduled for 03/06/2019 @ [facility], pt has been notified and verbalized understanding

## 2019-03-03 ENCOUNTER — Other Ambulatory Visit: Payer: Self-pay

## 2019-03-03 ENCOUNTER — Other Ambulatory Visit
Admission: RE | Admit: 2019-03-03 | Discharge: 2019-03-03 | Disposition: A | Discharge status: Home / Self Care | Payer: Medicaid Other | Source: Ambulatory Visit | Attending: Gastroenterology | Admitting: Gastroenterology

## 2019-03-03 DIAGNOSIS — Z01812 Encounter for preprocedural laboratory examination: Secondary | ICD-10-CM | POA: Diagnosis present

## 2019-03-03 DIAGNOSIS — Z20828 Contact with and (suspected) exposure to other viral communicable diseases: Secondary | ICD-10-CM | POA: Insufficient documentation

## 2019-03-03 LAB — SARS CORONAVIRUS 2 (TAT 6-24 HRS): SARS Coronavirus 2: NEGATIVE

## 2019-03-06 ENCOUNTER — Encounter: Payer: Self-pay | Admitting: Certified Registered Nurse Anesthetist

## 2019-03-06 ENCOUNTER — Ambulatory Visit: Payer: Medicaid Other | Admitting: Certified Registered Nurse Anesthetist

## 2019-03-06 ENCOUNTER — Other Ambulatory Visit: Payer: Self-pay

## 2019-03-06 ENCOUNTER — Ambulatory Visit
Admission: RE | Admit: 2019-03-06 | Discharge: 2019-03-06 | Disposition: A | Discharge status: Home / Self Care | Payer: Medicaid Other | Attending: Gastroenterology | Admitting: Gastroenterology

## 2019-03-06 ENCOUNTER — Telehealth: Payer: Self-pay

## 2019-03-06 ENCOUNTER — Encounter
Admission: RE | Disposition: A | Discharge status: Home / Self Care | Payer: Self-pay | Source: Home / Self Care | Attending: Gastroenterology

## 2019-03-06 DIAGNOSIS — J45909 Unspecified asthma, uncomplicated: Secondary | ICD-10-CM | POA: Diagnosis not present

## 2019-03-06 DIAGNOSIS — K279 Peptic ulcer, site unspecified, unspecified as acute or chronic, without hemorrhage or perforation: Secondary | ICD-10-CM | POA: Diagnosis not present

## 2019-03-06 DIAGNOSIS — K3189 Other diseases of stomach and duodenum: Secondary | ICD-10-CM | POA: Insufficient documentation

## 2019-03-06 DIAGNOSIS — F419 Anxiety disorder, unspecified: Secondary | ICD-10-CM | POA: Insufficient documentation

## 2019-03-06 DIAGNOSIS — K219 Gastro-esophageal reflux disease without esophagitis: Secondary | ICD-10-CM

## 2019-03-06 DIAGNOSIS — Z79899 Other long term (current) drug therapy: Secondary | ICD-10-CM | POA: Diagnosis not present

## 2019-03-06 DIAGNOSIS — Z9889 Other specified postprocedural states: Secondary | ICD-10-CM | POA: Diagnosis not present

## 2019-03-06 DIAGNOSIS — F17210 Nicotine dependence, cigarettes, uncomplicated: Secondary | ICD-10-CM | POA: Diagnosis not present

## 2019-03-06 DIAGNOSIS — F319 Bipolar disorder, unspecified: Secondary | ICD-10-CM | POA: Insufficient documentation

## 2019-03-06 DIAGNOSIS — Z791 Long term (current) use of non-steroidal anti-inflammatories (NSAID): Secondary | ICD-10-CM | POA: Diagnosis not present

## 2019-03-06 DIAGNOSIS — K449 Diaphragmatic hernia without obstruction or gangrene: Secondary | ICD-10-CM

## 2019-03-06 HISTORY — DX: Diaphragmatic hernia without obstruction or gangrene: K44.9

## 2019-03-06 HISTORY — PX: ESOPHAGOGASTRODUODENOSCOPY (EGD) WITH PROPOFOL: SHX5813

## 2019-03-06 LAB — POCT PREGNANCY, URINE: Preg Test, Ur: NEGATIVE

## 2019-03-06 SURGERY — ESOPHAGOGASTRODUODENOSCOPY (EGD) WITH PROPOFOL
Anesthesia: General

## 2019-03-06 MED ORDER — LIDOCAINE HCL (PF) 2 % IJ SOLN
INTRAMUSCULAR | Status: AC
  Filled 2019-03-06: qty 10

## 2019-03-06 MED ORDER — MIDAZOLAM HCL 2 MG/2ML IJ SOLN
INTRAMUSCULAR | Status: DC | PRN
  Administered 2019-03-06: 2 mg via INTRAVENOUS

## 2019-03-06 MED ORDER — PROPOFOL 500 MG/50ML IV EMUL
INTRAVENOUS | Status: AC
  Filled 2019-03-06: qty 50

## 2019-03-06 MED ORDER — PROPOFOL 10 MG/ML IV BOLUS
INTRAVENOUS | Status: DC | PRN
  Administered 2019-03-06 (×2): 20 mg via INTRAVENOUS
  Administered 2019-03-06: 80 mg via INTRAVENOUS
  Administered 2019-03-06: 20 mg via INTRAVENOUS

## 2019-03-06 MED ORDER — MIDAZOLAM HCL 2 MG/2ML IJ SOLN
INTRAMUSCULAR | Status: AC
  Filled 2019-03-06: qty 2

## 2019-03-06 MED ORDER — LIDOCAINE HCL (CARDIAC) PF 100 MG/5ML IV SOSY
PREFILLED_SYRINGE | INTRAVENOUS | Status: DC | PRN
  Administered 2019-03-06: 50 mg via INTRAVENOUS

## 2019-03-06 MED ORDER — SODIUM CHLORIDE 0.9 % IV SOLN
INTRAVENOUS | Status: DC
  Administered 2019-03-06: 10:00:00 via INTRAVENOUS

## 2019-03-06 MED ORDER — PROPOFOL 500 MG/50ML IV EMUL
INTRAVENOUS | Status: DC | PRN
  Administered 2019-03-06: 130 ug/kg/min via INTRAVENOUS

## 2019-03-06 NOTE — Telephone Encounter (Signed)
Referral and notes faxed to [facility] [phone] at this time-  Patient notified and to call office if not contacted within 7 days.

## 2019-03-06 NOTE — Anesthesia Post-op Follow-up Note (Signed)
Anesthesia QCDR form completed.        

## 2019-03-06 NOTE — H&P (Signed)
[place] Darby, MD [address]  [place]  [place], [place] [address]  Main: [phone]  Fax: [phone] Pager: [phone]  Primary Care Physician:  [name], MD Primary Gastroenterologist:  Dr. Cephas Pierce  Pre-Procedure History & Physical: HPI:  [place] Pierce is a 38 y.o. female is here for an endoscopy.   Past Medical History:  Diagnosis Date  . Anxiety   . Asthma   . Bipolar 1 disorder ([place])   . Depression   . Diverticulitis   . GERD (gastroesophageal reflux disease)   . Hernia, abdominal   . Hiatal hernia   . Ulcerative colitis   . Vaginal septum     Past Surgical History:  Procedure Laterality Date  . CLOSED REDUCTION METACARPAL WITH PERCUTANEOUS PINNING Left 03/07/2018   Procedure: CLOSED REDUCTION METACARPAL WITH PERCUTANEOUS PINNING-5th metacarpal;  Surgeon: Corky Mull, MD;  Location: [facility];  Service: Orthopedics;  Laterality: Left;  . COLONOSCOPY WITH PROPOFOL N/A 05/14/2018   Procedure: COLONOSCOPY WITH PROPOFOL;  Surgeon: Lin Landsman, MD;  Location: [facility] ENDOSCOPY;  Service: Gastroenterology;  Laterality: N/A;  . ESOPHAGOGASTRODUODENOSCOPY (EGD) WITH PROPOFOL N/A 05/14/2018   Procedure: ESOPHAGOGASTRODUODENOSCOPY (EGD) WITH PROPOFOL;  Surgeon: Lin Landsman, MD;  Location: [facility] ENDOSCOPY;  Service: Gastroenterology;  Laterality: N/A;  . HARDWARE REMOVAL Left 04/03/2018   Procedure: LEFT HAND FIFTH METACARPAL PIN REMOVAL;  Surgeon: Corky Mull, MD;  Location: [place];  Service: Orthopedics;  Laterality: Left;  . ROBOTIC ASSISTED LAPAROSCOPIC NISSEN FUNDOPLICATION N/A 9/50/9326   Procedure: ROBOTIC ASSISTED LAPAROSCOPIC NISSEN FUNDOPLICATION;  Surgeon: Jules Husbands, MD;  Location: [facility];  Service: General;  Laterality: N/A;  . TRIGGER FINGER RELEASE Left 06/19/2018   Procedure: EXTENSOR TENOLYSIS WITH CAPSULAR RELEASE OF LEFT LITTLE MCP JOINT;  Surgeon: Corky Mull, MD;  Location: [place];   Service: Orthopedics;  Laterality: Left;  . TUBAL LIGATION      Prior to Admission medications   Medication Sig Start Date End Date Taking? Authorizing Provider  albuterol (PROVENTIL HFA;VENTOLIN HFA) 108 (90 Base) MCG/ACT inhaler Inhale 2 puffs into the lungs every 6 (six) hours as needed for wheezing or shortness of breath.    [provider]  amitriptyline (ELAVIL) 25 MG tablet Take 1 tablet (25 mg total) by mouth at bedtime. 12/20/18 01/19/19  Lin Landsman, MD  cariprazine (VRAYLAR) capsule Take 1.5 mg by mouth 2 (two) times daily.    [provider]  cetirizine (ZYRTEC) 10 MG tablet Take 10 mg by mouth every morning.     [provider]  cyclobenzaprine (FLEXERIL) 10 MG tablet Take 10 mg by mouth 3 (three) times daily as needed for muscle spasms.     [provider]  gabapentin (NEURONTIN) 300 MG capsule Take 900 mg by mouth 2 (two) times daily.     [provider]  guanFACINE (INTUNIV) 1 MG TB24 ER tablet Take 1 mg by mouth every morning.     [provider]  hydroxypropyl methylcellulose / hypromellose (ISOPTO TEARS / GONIOVISC) 2.5 % ophthalmic solution Place 2 drops into both eyes 2 (two) times daily as needed for dry eyes. ARTIFICAL TEARS    [provider]  ketotifen ([employer] EYE ITCH RELIEF) 0.025 % ophthalmic solution Place 2 drops into both eyes 2 (two) times daily.     [provider]  lubiprostone (AMITIZA) 8 MCG capsule Take 1 capsule (8 mcg total) by mouth 2 (two) times daily with a  meal. 02/24/19 03/26/19  Lin Landsman, MD  metoCLOPramide (REGLAN) 5 MG tablet Take 1 tablet (5 mg total) by mouth every 8 (eight) hours as needed for nausea. 02/24/19   Pabon, Marjory Lies, MD  Multiple Vitamin (MULTIVITAMIN WITH MINERALS) TABS tablet Take 1 tablet by mouth daily.    [provider]  naproxen (NAPROSYN) 500 MG tablet Take 500 mg by mouth 2 (two) times daily as needed for moderate pain.     [provider]  ondansetron (ZOFRAN-ODT) 4 MG disintegrating tablet Take 1 tablet (4 mg total) by mouth every 6 (six) hours as needed for nausea or vomiting. 09/27/18   Tylene Fantasia, PA-C  pantoprazole (PROTONIX) 40 MG tablet TAKE 1 TABLET (40 MG TOTAL) BY MOUTH 2 (TWO) TIMES DAILY BEFORE A MEAL. 10/30/18 01/28/19  Lin Landsman, MD  promethazine (PHENERGAN) 25 MG tablet Take 25 mg by mouth every 6 (six) hours as needed for nausea or vomiting.    [provider]  propranolol (INDERAL) 10 MG tablet Take 10 mg by mouth 2 (two) times daily.    [provider]  topiramate (TOPAMAX) 50 MG tablet Take 50 mg by mouth 2 (two) times daily.    [provider]  vitamin B-12 (CYANOCOBALAMIN) 1000 MCG tablet Take 1,000 mcg by mouth daily.    [provider]    Allergies as of 02/27/2019  . (No Known Allergies)    Family History  Problem Relation Age of Onset  . Hypertension Sister     Social History   Socioeconomic History  . Marital status: Single    Spouse name: Not on file  . Number of children: Not on file  . Years of education: Not on file  . Highest education level: Not on file  Occupational History  . Not on file  Social Needs  . Financial resource strain: Not on file  . Food insecurity    Worry: Not on file    Inability: Not on file  . Transportation needs    Medical: Not on file    Non-medical: Not on file  Tobacco Use  . Smoking status: Current Every Day Smoker    Packs/day: 1.00    Years: 27.00    Pack years: 27.00    Types: Cigarettes  . Smokeless tobacco: Never Used  Substance and Sexual Activity  . Alcohol use: No  . Drug use: Yes    Types: Marijuana  . Sexual activity: Yes    Birth control/protection: None, I.U.D., Surgical  Lifestyle  . Physical activity    Days per week: Not on file    Minutes per session: Not on file  . Stress: Not on file  Relationships  . Social Herbalist on phone: Not on file     Gets together: Not on file    Attends religious service: Not on file    Active member of club or organization: Not on file    Attends meetings of clubs or organizations: Not on file    Relationship status: Not on file  . Intimate partner violence    Fear of current or ex partner: Not on file    Emotionally abused: Not on file    Physically abused: Not on file    Forced sexual activity: Not on file  Other Topics Concern  . Not on file  Social History Narrative  . Not on file    Review of Systems: See HPI, otherwise negative ROS  Physical Exam: BP 113/90   Pulse 88   Temp (!) 97.4 F (36.3 C) (Temporal)   Resp 20   Ht 5' 3"  (1.6 m)   Wt 80.7 kg   LMP 02/14/2019 (Exact Date) Comment: urine pregnancy to be done  SpO2 100%   BMI 31.53 kg/m  General:   Alert,  pleasant and cooperative in NAD Head:  Normocephalic and atraumatic. Neck:  Supple; no masses or thyromegaly. Lungs:  Clear throughout to auscultation.    Heart:  Regular rate and rhythm. Abdomen:  Soft, nontender and nondistended. Normal bowel sounds, without guarding, and without rebound.   Neurologic:  Alert and  oriented x4;  grossly normal neurologically.  Impression/Plan: [facility] Pierce is here for an endoscopy to be performed for chronic GERD, infrequent burping, s/p nissen's fundoplication  Risks, benefits, limitations, and alternatives regarding  endoscopy have been reviewed with the patient.  Questions have been answered.  All parties agreeable.   Sherri Sear, MD  03/06/2019, 10:37 AM

## 2019-03-06 NOTE — Transfer of Care (Signed)
Immediate Anesthesia Transfer of Care Note  Patient: [place] Pierce  Procedure(s) Performed: ESOPHAGOGASTRODUODENOSCOPY (EGD) WITH PROPOFOL (N/A )  Patient Location: PACU and Endoscopy Unit  Anesthesia Type:General  Level of Consciousness: awake, alert , oriented and patient cooperative  Airway & Oxygen Therapy: Patient Spontanous Breathing  Post-op Assessment: Report given to RN and Post -op Vital signs reviewed and stable  Post vital signs: Reviewed and stable  Last Vitals:  Vitals Value Taken Time  BP 106/61 03/06/19 1107  Temp 36.3 C 03/06/19 1107  Pulse 95 03/06/19 1107  Resp 22 03/06/19 1107  SpO2 100 % 03/06/19 1107    Last Pain:  Vitals:   03/06/19 1107  TempSrc: Temporal  PainSc: 0-No pain         Complications: No apparent anesthesia complications

## 2019-03-06 NOTE — Anesthesia Postprocedure Evaluation (Signed)
Anesthesia Post Note  Patient: [place] Pierce  Procedure(s) Performed: ESOPHAGOGASTRODUODENOSCOPY (EGD) WITH PROPOFOL (N/A )  Patient location during evaluation: Endoscopy Anesthesia Type: General Level of consciousness: awake and alert Pain management: pain level controlled Vital Signs Assessment: post-procedure vital signs reviewed and stable Respiratory status: spontaneous breathing, nonlabored ventilation, respiratory function stable and patient connected to nasal cannula oxygen Cardiovascular status: blood pressure returned to baseline and stable Postop Assessment: no apparent nausea or vomiting Anesthetic complications: no     Last Vitals:  Vitals:   03/06/19 1117 03/06/19 1127  BP: 118/83 (!) 127/95  Pulse: 89 93  Resp: 14 18  Temp:    SpO2: 100% 100%    Last Pain:  Vitals:   03/06/19 1127  TempSrc:   PainSc: 0-No pain                 Martha Clan

## 2019-03-06 NOTE — Anesthesia Preprocedure Evaluation (Signed)
Anesthesia Evaluation  Patient identified by MRN, date of birth, ID band Patient awake    Reviewed: Allergy & Precautions, H&P , NPO status , Patient's Chart, lab work & pertinent test results, reviewed documented beta blocker date and time   History of Anesthesia Complications Negative for: history of anesthetic complications  Airway Mallampati: I  TM Distance: >3 FB Neck ROM: full    Dental  (+) Dental Advidsory Given, Loose   Pulmonary neg shortness of breath, asthma , neg sleep apnea, neg recent URI, Current Smoker and Patient abstained from smoking.,           Cardiovascular Exercise Tolerance: Good negative cardio ROS       Neuro/Psych PSYCHIATRIC DISORDERS Anxiety Depression Bipolar Disorder negative neurological ROS     GI/Hepatic Neg liver ROS, hiatal hernia, PUD, GERD  ,  Endo/Other  negative endocrine ROS  Renal/GU negative Renal ROS  negative genitourinary   Musculoskeletal   Abdominal   Peds  Hematology negative hematology ROS (+)   Anesthesia Other Findings Past Medical History: No date: Anxiety No date: Asthma No date: Bipolar 1 disorder (HCC) No date: Depression No date: GERD (gastroesophageal reflux disease) No date: Ulcerative colitis No date: Vaginal septum   Reproductive/Obstetrics negative OB ROS                             Anesthesia Physical  Anesthesia Plan  ASA: II  Anesthesia Plan: General   Post-op Pain Management:    Induction: Intravenous  PONV Risk Score and Plan: 2 and Propofol infusion and TIVA  Airway Management Planned: Nasal Cannula and Natural Airway  Additional Equipment:   Intra-op Plan:   Post-operative Plan:   Informed Consent: I have reviewed the patients History and Physical, chart, labs and discussed the procedure including the risks, benefits and alternatives for the proposed anesthesia with the patient or authorized  representative who has indicated his/her understanding and acceptance.     Dental Advisory Given  Plan Discussed with: Anesthesiologist, CRNA and Surgeon  Anesthesia Plan Comments:         Anesthesia Quick Evaluation

## 2019-03-06 NOTE — Op Note (Signed)
[facility] Gastroenterology Patient Name: Victoria Pierce Procedure Date: 03/06/2019 10:37 AM MRN: [id number] Account #: [id number] Date of Birth: 1980/11/07 Admit Type: Outpatient Age: 38 Room: [facility] ENDO ROOM 1 Gender: Female Note Status: Finalized Procedure:             Upper GI endoscopy Indications:           Esophageal reflux, , Failure to respond to medical                         treatment, h/o recent Nissen's fundoplication Providers:             Lin Landsman MD, MD Referring MD:          Edmonia Lynch. Aycock MD (Referring MD) Medicines:             Monitored Anesthesia Care Complications:         No immediate complications. Estimated blood loss: None. Procedure:             Pre-Anesthesia Assessment:                        - Prior to the procedure, a History and Physical was                         performed, and patient medications and allergies were                         reviewed. The patient is competent. The risks and                         benefits of the procedure and the sedation options and                         risks were discussed with the patient. All questions                         were answered and informed consent was obtained.                         Patient identification and proposed procedure were                         verified by the physician, the nurse, the                         anesthesiologist, the anesthetist and the technician                         in the pre-procedure area in the procedure room in the                         endoscopy suite. Mental Status Examination: alert and                         oriented. Airway Examination: normal oropharyngeal                         airway and neck mobility. Respiratory Examination:  clear to auscultation. CV Examination: normal.                         Prophylactic Antibiotics: The patient does not require                         prophylactic  antibiotics. Prior Anticoagulants: The                         patient has taken no previous anticoagulant or                         antiplatelet agents. ASA Grade Assessment: II - A                         patient with mild systemic disease. After reviewing                         the risks and benefits, the patient was deemed in                         satisfactory condition to undergo the procedure. The                         anesthesia plan was to use monitored anesthesia care                         (MAC). Immediately prior to administration of                         medications, the patient was re-assessed for adequacy                         to receive sedatives. The heart rate, respiratory                         rate, oxygen saturations, blood pressure, adequacy of                         pulmonary ventilation, and response to care were                         monitored throughout the procedure. The physical                         status of the patient was re-assessed after the                         procedure.                        After obtaining informed consent, the endoscope was                         passed under direct vision. Throughout the procedure,                         the patient's blood pressure, pulse, and oxygen  saturations were monitored continuously. The Endoscope                         was introduced through the mouth, and advanced to the                         second part of duodenum. The upper GI endoscopy was                         accomplished without difficulty. The patient tolerated                         the procedure fairly well. Findings:      The duodenal bulb and second portion of the duodenum were normal.      Evidence of a Nissen fundoplication was found in the gastric fundus. The       wrap appeared loose. This was traversed.      A few dispersed diminutive erosions with no bleeding and no stigmata of        recent bleeding were found in the gastric body.      The gastroesophageal junction and examined esophagus were normal. Impression:            - Normal duodenal bulb and second portion of the                         duodenum.                        - A Nissen fundoplication was found. The wrap appears                         loose.                        - Erosive gastropathy with no bleeding and no stigmata                         of recent bleeding.                        - Normal gastroesophageal junction and esophagus.                        - No specimens collected. Recommendation:        - Discharge patient to home (with escort).                        - Resume previous diet today.                        - Continue present medications. Procedure Code(s):     --- Professional ---                        [phone], Esophagogastroduodenoscopy, flexible,                         transoral; diagnostic, including collection of                         specimen(s) by brushing  or washing, when performed                         (separate procedure) Diagnosis Code(s):     --- Professional ---                        [phone], Other specified postprocedural states                        K31.89, Other diseases of stomach and duodenum                        K21.9, Gastro-esophageal reflux disease without                         esophagitis CPT copyright 2019 American Medical Association. All rights reserved. The codes documented in this report are preliminary and upon coder review may  be revised to meet current compliance requirements. Dr. Ulyess Mort Lin Landsman MD, MD 03/06/2019 11:06:33 AM This report has been signed electronically. Number of Addenda: 0 Note Initiated On: 03/06/2019 10:37 AM Estimated Blood Loss:  Estimated blood loss: none.      [facility]

## 2019-03-07 ENCOUNTER — Encounter: Payer: Self-pay | Admitting: Gastroenterology

## 2019-03-07 NOTE — Telephone Encounter (Signed)
Patient notified of referral faxed and number to call. She will call office if she has not been contacted within 7 days.

## 2019-03-10 ENCOUNTER — Telehealth: Payer: Self-pay

## 2019-03-10 NOTE — Telephone Encounter (Signed)
Patient scheduled with Dr.Farrell at [facility] surgery 06/09/2019.

## 2019-03-10 NOTE — Telephone Encounter (Signed)
Patient called office at this time stating she was asked to call our office and have the Op note from her surgery in June 2020 faxed to [place] at [phone].  Op note faxed to the above number at this time.

## 2019-03-17 ENCOUNTER — Telehealth: Payer: Self-pay

## 2019-03-17 MED ORDER — METOCLOPRAMIDE HCL 5 MG PO TABS: 5.0000 mg | ORAL_TABLET | Freq: Four times a day (QID) | ORAL | 0 refills | Status: DC

## 2019-03-17 NOTE — Telephone Encounter (Signed)
Per Thedore Mins Reglan 5 mg refill.

## 2019-03-19 ENCOUNTER — Encounter: Payer: Self-pay | Admitting: Occupational Therapy

## 2019-03-24 ENCOUNTER — Ambulatory Visit: Payer: Self-pay | Admitting: Surgery

## 2019-04-03 ENCOUNTER — Other Ambulatory Visit: Payer: Self-pay | Admitting: Obstetrics and Gynecology

## 2019-04-03 DIAGNOSIS — Z716 Tobacco abuse counseling: Secondary | ICD-10-CM

## 2019-04-27 ENCOUNTER — Other Ambulatory Visit: Payer: Self-pay | Admitting: Gastroenterology

## 2019-04-27 DIAGNOSIS — R072 Precordial pain: Secondary | ICD-10-CM

## 2019-04-30 ENCOUNTER — Institutional Professional Consult (permissible substitution): Payer: Medicaid Other | Admitting: Pulmonary Disease

## 2019-05-08 ENCOUNTER — Other Ambulatory Visit: Payer: Self-pay | Admitting: Obstetrics and Gynecology

## 2019-05-08 DIAGNOSIS — Z716 Tobacco abuse counseling: Secondary | ICD-10-CM

## 2019-05-12 ENCOUNTER — Encounter: Payer: Self-pay | Admitting: Pulmonary Disease

## 2019-05-12 ENCOUNTER — Other Ambulatory Visit: Payer: Self-pay

## 2019-05-12 ENCOUNTER — Ambulatory Visit (INDEPENDENT_AMBULATORY_CARE_PROVIDER_SITE_OTHER): Payer: Medicaid Other | Admitting: Pulmonary Disease

## 2019-05-12 VITALS — BP 128/72 | HR 80 | Temp 97.0°F | Ht 63.0 in | Wt 193.2 lb

## 2019-05-12 DIAGNOSIS — J45909 Unspecified asthma, uncomplicated: Secondary | ICD-10-CM

## 2019-05-12 DIAGNOSIS — K219 Gastro-esophageal reflux disease without esophagitis: Secondary | ICD-10-CM

## 2019-05-12 DIAGNOSIS — F17210 Nicotine dependence, cigarettes, uncomplicated: Secondary | ICD-10-CM

## 2019-05-12 DIAGNOSIS — K449 Diaphragmatic hernia without obstruction or gangrene: Secondary | ICD-10-CM

## 2019-05-12 DIAGNOSIS — R059 Cough, unspecified: Secondary | ICD-10-CM

## 2019-05-12 DIAGNOSIS — R05 Cough: Secondary | ICD-10-CM | POA: Diagnosis not present

## 2019-05-12 MED ORDER — BUDESONIDE-FORMOTEROL FUMARATE 80-4.5 MCG/ACT IN AERO: 2.0000 | INHALATION_SPRAY | Freq: Two times a day (BID) | RESPIRATORY_TRACT | 0 refills | Status: DC

## 2019-05-12 NOTE — Progress Notes (Signed)
Subjective:    Patient ID: Victoria Pierce, female    DOB: [birth year], 39 y.o.   MRN: [id number]  HPI This is a 39 year old current smoker (half PPD) who presents as a referral for management of mild intermittent asthma. Patient is kindly referred by Dr. Clide Deutscher. Patient states that she has had asthma for several years and for the most part stays controlled in this regard. She does not have a coughing spells that are so violent sometimes that she loses her breath and almost passes out. She notices that she has "constant" congestion in her chest. The symptoms have been present for over 6 years. She had a CT scan of the chest in 2020 for assessment of a potential hiatal hernia which showed some minor atelectasis versus scarring. She also had a 6 mm right middle lobe nodule and a 4 mm right upper lobe pulmonary nodule.  She notes that inhalers and allergy medications helps her symptoms but other times her symptoms do not subside. She notes shortness of breath on walking up stairs and when she is active. Shortness of breath is also worse in the mornings. She has not been evaluated by pulmonologist previously.  He has not had any fevers, chills or sweats and no other symptomatology. Current inhalers only Ventolin as needed. She has ongoing issues with gastroesophageal reflux and chest pain associated with this and with heartburn.  She has had prior patient appears to have some issues with this. She is to see surgery at [facility] for this issue.   She is currently to enroll in smoking cessation and is going to get Chantix through her primary physician.  Of note she has 5 cats and 1 dog.   Review of Systems A 10 point review of systems was performed and it is as noted above otherwise negative.  Past Medical History:  Diagnosis Date  . Anxiety   . Asthma   . Bipolar 1 disorder ([place])   . Depression   . Diverticulitis   . GERD (gastroesophageal reflux disease)   . Hernia, abdominal   . Hiatal  hernia   . Ulcerative colitis   . Vaginal septum    Patient Active Problem List   Diagnosis Date Noted  . Non-intractable vomiting   . Rectal bleeding   . Tobacco use disorder 08/11/2019  . S/P repair of paraesophageal hernia 08/07/2019  . History of Nissen fundoplication 93/81/0175  . Chronic pelvic pain in female 06/14/2018  . Dysmenorrhea 06/14/2018  . Menorrhagia with regular cycle 06/14/2018  . Contracture of finger joint, 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  . Closed displaced fracture of neck of fifth metacarpal bone of left hand 03/07/2018  . Chronic GERD   . Anxiety    Past Surgical History:  Procedure Laterality Date  . CLOSED REDUCTION METACARPAL WITH PERCUTANEOUS PINNING Left 03/07/2018   Procedure: CLOSED REDUCTION METACARPAL WITH PERCUTANEOUS PINNING-5th metacarpal;  Surgeon: Corky Mull, MD;  Location: [facility] ORS;  Service: Orthopedics;  Laterality: Left;  . COLONOSCOPY WITH PROPOFOL N/A 05/14/2018   Procedure: COLONOSCOPY WITH PROPOFOL;  Surgeon: Lin Landsman, MD;  Location: [facility] ENDOSCOPY;  Service: Gastroenterology;  Laterality: N/A;  . ESOPHAGOGASTRODUODENOSCOPY (EGD) WITH PROPOFOL N/A 05/14/2018   Procedure: ESOPHAGOGASTRODUODENOSCOPY (EGD) WITH PROPOFOL;  Surgeon: Lin Landsman, MD;  Location: [facility] ENDOSCOPY;  Service: Gastroenterology;  Laterality: N/A;  . ESOPHAGOGASTRODUODENOSCOPY (EGD) WITH PROPOFOL N/A 03/06/2019   Procedure: ESOPHAGOGASTRODUODENOSCOPY (EGD) WITH PROPOFOL;  Surgeon: Lin Landsman, MD;  Location: [facility] ENDOSCOPY;  Service: Gastroenterology;  Laterality: N/A;  .  ESOPHAGOGASTRODUODENOSCOPY (EGD) WITH PROPOFOL N/A 02/04/2020   Procedure: ESOPHAGOGASTRODUODENOSCOPY (EGD) WITH PROPOFOL;  Surgeon: Lin Landsman, MD;  Location: [place];  Service: Gastroenterology;  Laterality: N/A;  . FRACTURE SURGERY    . HARDWARE REMOVAL Left 04/03/2018   Procedure: LEFT HAND FIFTH METACARPAL PIN REMOVAL;  Surgeon: Corky Mull, MD;  Location: [place];  Service: Orthopedics;  Laterality: Left;  . HERNIA REPAIR    . ROBOTIC ASSISTED LAPAROSCOPIC NISSEN FUNDOPLICATION N/A 12/17/1113   Procedure: ROBOTIC ASSISTED LAPAROSCOPIC NISSEN FUNDOPLICATION;  Surgeon: Jules Husbands, MD;  Location: [facility] ORS;  Service: General;  Laterality: N/A;  . TRIGGER FINGER RELEASE Left 06/19/2018   Procedure: EXTENSOR TENOLYSIS WITH CAPSULAR RELEASE OF LEFT LITTLE MCP JOINT;  Surgeon: Corky Mull, MD;  Location: [place];  Service: Orthopedics;  Laterality: Left;  . TUBAL LIGATION     Family History  Problem Relation Age of Onset  . Hypertension Sister    Social History   Tobacco Use  . Smoking status: Current Every Day Smoker    Packs/day: 1.00    Years: 27.00    Pack years: 27.00    Types: Cigarettes  . Smokeless tobacco: Never Used  . Tobacco comment: .50 currently  Substance Use Topics  . Alcohol use: No   No Known Allergies  Current Meds  Medication Sig  . albuterol (PROVENTIL HFA;VENTOLIN HFA) 108 (90 Base) MCG/ACT inhaler Inhale 2 puffs into the lungs every 6 (six) hours as needed for wheezing or shortness of breath.  . cyclobenzaprine (FLEXERIL) 10 MG tablet Take 10 mg by mouth 3 (three) times daily as needed for muscle spasms.   . hydroxypropyl methylcellulose / hypromellose (ISOPTO TEARS / GONIOVISC) 2.5 % ophthalmic solution Place 2 drops into both eyes 2 (two) times daily as needed for dry eyes. ARTIFICAL TEARS  . ketotifen ([employer] EYE ITCH RELIEF) 0.025 % ophthalmic solution Place 2 drops into both eyes 2 (two) times daily.   . Multiple Vitamin (MULTIVITAMIN WITH MINERALS) TABS tablet Take 1 tablet by mouth daily.  . ondansetron (ZOFRAN-ODT) 4 MG disintegrating tablet Take 1 tablet (4 mg total) by mouth every 6 (six) hours as needed for nausea or vomiting.  . topiramate (TOPAMAX) 50 MG tablet Take 50 mg by mouth 2 (two) times daily.  . [DISCONTINUED] cariprazine (VRAYLAR) capsule Take 1.5 mg by mouth 2 (two) times  daily. (Patient not taking: Reported on 01/29/2020)  . [DISCONTINUED] cetirizine (ZYRTEC) 10 MG tablet Take 10 mg by mouth every morning.   . [DISCONTINUED] gabapentin (NEURONTIN) 300 MG capsule Take 900 mg by mouth 2 (two) times daily.   . [DISCONTINUED] guanFACINE (INTUNIV) 1 MG TB24 ER tablet Take 1 mg by mouth every morning.   . [DISCONTINUED] metoCLOPramide (REGLAN) 5 MG tablet Take 1 tablet (5 mg total) by mouth every 8 (eight) hours as needed for nausea. (Patient not taking: Reported on 09/02/2019)  . [DISCONTINUED] metoCLOPramide (REGLAN) 5 MG tablet Take 1 tablet (5 mg total) by mouth 4 (four) times daily. (Patient not taking: Reported on 09/02/2019)  . [DISCONTINUED] naproxen (NAPROSYN) 500 MG tablet Take 500 mg by mouth 2 (two) times daily as needed for moderate pain.   . [DISCONTINUED] pantoprazole (PROTONIX) 40 MG tablet Take 40 mg by mouth daily.  . [DISCONTINUED] pantoprazole (PROTONIX) 40 MG tablet TAKE 1 TABLET (40 MG TOTAL) BY MOUTH 2 (TWO) TIMES DAILY BEFORE A MEAL. (Patient not taking: Reported on 01/29/2020)  . [DISCONTINUED] promethazine (PHENERGAN) 25 MG tablet Take  25 mg by mouth every 6 (six) hours as needed for nausea or vomiting.  . [DISCONTINUED] propranolol (INDERAL) 10 MG tablet Take 10 mg by mouth 2 (two) times daily.  . [DISCONTINUED] vitamin B-12 (CYANOCOBALAMIN) 1000 MCG tablet Take 1,000 mcg by mouth daily.      Objective:   Physical Exam BP 128/72 (BP Location: Left Arm, Patient Position: Sitting, Cuff Size: Large)   Pulse 80   Temp (!) 97 F (36.1 C) (Temporal)   Ht 5' 3"  (1.6 m)   Wt 193 lb 3.2 oz (87.6 kg)   SpO2 98% Comment: on ra  BMI 34.22 kg/m  GENERAL: Well-developed weight woman, in no acute distress. Somewhat flat affect. HEAD: Normocephalic, atraumatic.  EYES: Pupils equal, round, reactive to light.  No scleral icterus.  MOUTH: Nose/mouth/throat not examined due to masking requirements for COVID 19. NECK: Supple. No thyromegaly. Trachea  midline. No JVD.  No adenopathy. PULMONARY: Good air entry bilaterally.  No adventitious sounds. CARDIOVASCULAR: S1 and S2. Regular rate and rhythm. No rubs, murmurs or gallops heard. ABDOMEN: Protuberant abdomen, soft, nondistended. MUSCULOSKELETAL: No joint deformity, no clubbing, no edema.  NEUROLOGIC: No overt focal deficit. Speech is fluent. SKIN: Intact,warm,dry. On limited exam no rashes. PSYCH: Flat affect, normal behavior.     Assessment & Plan:     ICD-10-CM   1. Moderate asthma without complication, unspecified whether persistent  J45.909    PFTs once COVID-19 restrictions lifted Trial of Symbicort 80/4.5, 2 inhalations twice a day STOP SMOKING  2. Cough  R05    Multifactorial likely related mostly to her hiatal hernia and GERD issues Poorly controlled asthma could also be adding to the issue  3. Hiatal hernia with GERD  K21.9    K44.9    Currently under evaluation by surgery at [facility] to need Nissen redo  4. Tobacco dependence due to cigarettes  F17.210    Patient counseled regards to discontinuation of smoking She is precontemplative in this regard   Meds ordered this encounter  Medications  . Budesonide-formoterol (SYMBICORT) 80-4.5 MCG/ACT inhaler    Sig: Inhale 2 puffs into the lungs 2 (two) times daily.    Dispense:  1 Inhaler    Refill:  0    Order Specific Question:   Lot Number?    Answer:   [id number]    Order Specific Question:   Manufacturer?    Answer:   [employer] [[id number]]    Order Specific Question:   Quantity    Answer:   1   Discussion:  The patient's issues with cough are likely mostly related to ongoing issues with gastroesophageal reflux. She has been encouraged to keep her appointments with surgery at [facility] they are evaluating for potential Niesen redo. With regards to her asthma issues it is imperative that she discontinue smoking and this has been made clear to her. She was counseled extensively in this regard. We have provided her a trial  of Symbicort 80/4.5, 2 inhalations twice a day. She is to let us know how this works for her. We will see her in follow-up. We will order PFTs at that time if the COVID-19 restrictions for this study have been lifted.  Renold Don, MD [facility]   *This note was dictated using voice recognition software/Dragon.  Despite best efforts to proofread, errors can occur which can change the meaning.  Any change was purely unintentional.

## 2019-05-12 NOTE — Patient Instructions (Addendum)
1.  Your cough is likely related to the issues with the hiatal hernia continue your antireflux medications.  I agree with evaluation by surgery at [facility].  2.  We will start Symbicort 80/4.5, 2 inhalations twice a day as a maintenance medication for your asthma.  3.  Check with your primary physician for fills on your Chantix.  I congratulate you on trying to discontinue smoking as this is a very important next step to control your asthma and your cough.  4.  We will see you in follow-up in 2 months time call sooner should any new difficulties arise.  We will schedule breathing tests once these come back on line as they are currently on hold due to COVID-19.

## 2019-05-17 ENCOUNTER — Other Ambulatory Visit: Payer: Self-pay | Admitting: Gastroenterology

## 2019-05-17 DIAGNOSIS — K5904 Chronic idiopathic constipation: Secondary | ICD-10-CM

## 2019-06-07 ENCOUNTER — Other Ambulatory Visit: Payer: Self-pay | Admitting: Gastroenterology

## 2019-06-07 DIAGNOSIS — K5904 Chronic idiopathic constipation: Secondary | ICD-10-CM

## 2019-06-10 ENCOUNTER — Other Ambulatory Visit: Payer: Self-pay

## 2019-06-10 ENCOUNTER — Encounter: Payer: Self-pay | Admitting: Gastroenterology

## 2019-06-10 MED ORDER — LUBIPROSTONE 8 MCG PO CAPS: 8.0000 ug | ORAL_CAPSULE | Freq: Two times a day (BID) | ORAL | 3 refills | Status: DC

## 2019-08-07 DIAGNOSIS — Z9889 Other specified postprocedural states: Secondary | ICD-10-CM | POA: Insufficient documentation

## 2019-08-11 DIAGNOSIS — F17200 Nicotine dependence, unspecified, uncomplicated: Secondary | ICD-10-CM | POA: Insufficient documentation

## 2019-08-12 ENCOUNTER — Encounter: Payer: Self-pay | Admitting: Gastroenterology

## 2019-08-27 ENCOUNTER — Other Ambulatory Visit: Payer: Self-pay | Admitting: Gastroenterology

## 2019-09-01 ENCOUNTER — Other Ambulatory Visit: Payer: Self-pay

## 2019-09-02 ENCOUNTER — Telehealth (INDEPENDENT_AMBULATORY_CARE_PROVIDER_SITE_OTHER): Payer: Medicaid Other | Admitting: Gastroenterology

## 2019-09-02 ENCOUNTER — Encounter: Payer: Self-pay | Admitting: Gastroenterology

## 2019-09-02 DIAGNOSIS — Z9889 Other specified postprocedural states: Secondary | ICD-10-CM

## 2019-09-02 DIAGNOSIS — K5904 Chronic idiopathic constipation: Secondary | ICD-10-CM | POA: Diagnosis not present

## 2019-09-02 MED ORDER — LUBIPROSTONE 8 MCG PO CAPS: ORAL_CAPSULE | ORAL | 1 refills | Status: DC

## 2019-09-02 NOTE — Progress Notes (Signed)
Sherri Sear, MD [address]  [place]  [place], [place] [address]  Main: [phone]  Fax: [phone]    Gastroenterology Consultation Video Visit  Referring Provider:     [name], MD Primary Care Physician:  [name], MD Primary Gastroenterologist:  Dr. Cephas Darby Reason for Consultation:   Follow-up of GERD and chronic constipation        HPI:   Victoria Pierce is a 39 y.o. female referred by Dr. [name], MD  for consultation & management of chronic GERD and chronic constipation  Virtual Visit Video Note  I connected with Lemmie Evens on 09/02/19 at  3:15 PM EDT by video and verified that I am speaking with the correct person using two identifiers.   I discussed the limitations, risks, security and privacy concerns of performing an evaluation and management service by video and the availability of in person appointments. I also discussed with the patient that there may be a patient responsible charge related to this service. The patient expressed understanding and agreed to proceed.  Location of the Patient: Home  Location of the provider: Office  Persons participating in the visit: Patient and provider   History of Present Illness: Victoria Pierce has been seeing me for chronic GERD and chronic constipation.  She underwent Nissen's fundoplication.  She continues to experience gas bubble in her upper stomach and unable to burp.  She saw Dr. Perrin Maltese on 02/14/2019, recommended CT abdomen and pelvis with contrast which showed small hiatal hernia and possible gastritis and duodenitis.  Patient is worried about the presence of hiatal hernia that was seen on the CT scan and she was wondering how it recurred so soon. She has appointment to see Dr. Perrin Maltese this afternoon to discuss further steps.  With regards to constipation, she reports that Amitiza 8 MCG samples that were given from my office are helping and she is trying to incorporate more  fiber in her diet.  She likes to have a prescription of Amitiza.  She has not tried topical nifedipine for anal fissure but tells me that her rectal discomfort is overall better.  Follow-up visit 09/02/2019 Patient underwent laparoscopic paraesophageal hernia repair with Toupet fundoplication and PEG tube for gastropexy. Surgery was performed on 08/07/2019.  This was performed by Dr. Cammie Sickle at [facility] [place] She reports doing well since then.  She is no longer having gurgling episodes, chest pain, burping.  She is no longer on pantoprazole and Reglan.  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  She underwent gastric emptying study which was normal.  She will have G-tube removed sometime next month.  She is tolerating Amitiza 8 MCG well.  She is taking only once a day, keeping her bowel movements regular.  She does not have any other concerns today    NSAIDs: None  Antiplts/Anticoagulants/Anti thrombotics: None  GI procedures:  Upper endoscopy 03/06/2019 - Normal duodenal bulb and second portion of the duodenum. - A Nissen fundoplication was found. The wrap appears loose. - Erosive gastropathy with no bleeding and no stigmata of recent bleeding. - Normal gastroesophageal junction and esophagus. - No specimens collected.  EGD 05/14/2018 - Normal duodenal bulb and second portion of the duodenum. Biopsied. - 4 cm hiatal hernia. - Erythematous mucosa in the gastric body and antrum. Biopsied. - Non-bleeding erosive gastropathy. - Esophagogastric landmarks identified. - Normal esophagus. Biopsied.  Colonoscopy 05/14/2018 - The examined portion of the  ileum was normal. - Normal mucosa in the entire examined colon. Biopsied. - Diverticulosis in the sigmoid colon and in the descending colon. - The distal rectum and anal verge are normal on retroflexion view.  DIAGNOSIS:  A. DUODENUM; BIOPSY:  - FEW AREAS OF VILLOUS BLUNTING WITHOUT  INCREASED INTRAEPITHELIAL  LYMPHOCYTES.  - SEE COMMENT.   B. STOMACH, RANDOM; BIOPSIES:  - BENIGN GASTRIC MUCOSA WITH FOCAL FIBROSIS IN LAMINA PROPRIA AND  REACTIVE/REGENERATIVE APPEARING GLANDS.  - NEGATIVE FOR ACTIVE MUCOSAL INFLAMMATION, H. PYLORI, INTESTINAL  METAPLASIA AND DYSPLASIA.  - SEE COMMENT.   C. ESOPHAGUS; BIOPSY:  - NO SIGNIFICANT PATHOLOGIC FEATURES.   D. COLON, RANDOM; BIOPSIES:  - NEGATIVE FOR ACTIVE MUCOSAL INFLAMMATION, GRANULOMAS AND  LYMPHOCYTIC/MICROSCOPIC COLITIS.  - FEATURES OF IDIOPATHIC CHRONIC INFLAMMATORY BOWEL DISEASE (ULCERATIVE  COLITIS) ARE NOT SEEN.  - NO DYSPLASIA OR MALIGNANCY IDENTIFIED.  Past Medical History:  Diagnosis Date  . Anxiety   . Asthma   . Bipolar 1 disorder ([place])   . Depression   . Diverticulitis   . GERD (gastroesophageal reflux disease)   . Hernia, abdominal   . Hiatal hernia   . Ulcerative colitis   . Vaginal septum     Past Surgical History:  Procedure Laterality Date  . CLOSED REDUCTION METACARPAL WITH PERCUTANEOUS PINNING Left 03/07/2018   Procedure: CLOSED REDUCTION METACARPAL WITH PERCUTANEOUS PINNING-5th metacarpal;  Surgeon: Corky Mull, MD;  Location: [facility] ORS;  Service: Orthopedics;  Laterality: Left;  . COLONOSCOPY WITH PROPOFOL N/A 05/14/2018   Procedure: COLONOSCOPY WITH PROPOFOL;  Surgeon: Lin Landsman, MD;  Location: [facility] ENDOSCOPY;  Service: Gastroenterology;  Laterality: N/A;  . ESOPHAGOGASTRODUODENOSCOPY (EGD) WITH PROPOFOL N/A 05/14/2018   Procedure: ESOPHAGOGASTRODUODENOSCOPY (EGD) WITH PROPOFOL;  Surgeon: Lin Landsman, MD;  Location: [facility] ENDOSCOPY;  Service: Gastroenterology;  Laterality: N/A;  . ESOPHAGOGASTRODUODENOSCOPY (EGD) WITH PROPOFOL N/A 03/06/2019   Procedure: ESOPHAGOGASTRODUODENOSCOPY (EGD) WITH PROPOFOL;  Surgeon: Lin Landsman, MD;  Location: [facility] ENDOSCOPY;  Service: Gastroenterology;  Laterality: N/A;  . HARDWARE REMOVAL Left 04/03/2018   Procedure: LEFT HAND  FIFTH METACARPAL PIN REMOVAL;  Surgeon: Corky Mull, MD;  Location: [facility];  Service: Orthopedics;  Laterality: Left;  . ROBOTIC ASSISTED LAPAROSCOPIC NISSEN FUNDOPLICATION N/A 09/12/4130   Procedure: ROBOTIC ASSISTED LAPAROSCOPIC NISSEN FUNDOPLICATION;  Surgeon: Jules Husbands, MD;  Location: [facility] ORS;  Service: General;  Laterality: N/A;  . TRIGGER FINGER RELEASE Left 06/19/2018   Procedure: EXTENSOR TENOLYSIS WITH CAPSULAR RELEASE OF LEFT LITTLE MCP JOINT;  Surgeon: Corky Mull, MD;  Location: [facility];  Service: Orthopedics;  Laterality: Left;  . TUBAL LIGATION      Current Outpatient Medications:  .  albuterol (PROVENTIL HFA;VENTOLIN HFA) 108 (90 Base) MCG/ACT inhaler, Inhale 2 puffs into the lungs every 6 (six) hours as needed for wheezing or shortness of breath., Disp: , Rfl:  .  AMITIZA 8 MCG capsule, TAKE 1 CAPSULE (8 MCG TOTAL) BY MOUTH 2 (TWO) TIMES DAILY WITH A MEAL., Disp: 60 capsule, Rfl: 0 .  cariprazine (VRAYLAR) capsule, Take 1.5 mg by mouth 2 (two) times daily., Disp: , Rfl:  .  cyclobenzaprine (FLEXERIL) 10 MG tablet, Take 10 mg by mouth 3 (three) times daily as needed for muscle spasms. , Disp: , Rfl:  .  gabapentin (NEURONTIN) 300 MG capsule, Take 900 mg by mouth 2 (two) times daily. , Disp: , Rfl:  .  hydroxypropyl methylcellulose / hypromellose (ISOPTO TEARS / GONIOVISC) 2.5 % ophthalmic solution, Place 2 drops  into both eyes 2 (two) times daily as needed for dry eyes. ARTIFICAL TEARS, Disp: , Rfl:  .  ketotifen ([employer] EYE ITCH RELIEF) 0.025 % ophthalmic solution, Place 2 drops into both eyes 2 (two) times daily. , Disp: , Rfl:  .  lisdexamfetamine (VYVANSE) 40 MG capsule, Take by mouth., Disp: , Rfl:  .  Melatonin 5 MG CAPS, Take by mouth., Disp: , Rfl:  .  Multiple Vitamin (MULTIVITAMIN WITH MINERALS) TABS tablet, Take 1 tablet by mouth daily., Disp: , Rfl:  .  ondansetron (ZOFRAN-ODT) 4 MG disintegrating tablet, Take 1 tablet (4 mg total) by mouth  every 6 (six) hours as needed for nausea or vomiting., Disp: 40 tablet, Rfl: 0 .  polyethylene glycol powder (GLYCOLAX/MIRALAX) 17 GM/SCOOP powder, Take by mouth., Disp: , Rfl:  .  propranolol (INDERAL) 20 MG tablet, Take by mouth., Disp: , Rfl:  .  topiramate (TOPAMAX) 50 MG tablet, Take 50 mg by mouth 2 (two) times daily., Disp: , Rfl:  .  traZODone (DESYREL) 50 MG tablet, Take by mouth., Disp: , Rfl:  .  pantoprazole (PROTONIX) 40 MG tablet, TAKE 1 TABLET (40 MG TOTAL) BY MOUTH 2 (TWO) TIMES DAILY BEFORE A MEAL., Disp: 60 tablet, Rfl: 3  Current Facility-Administered Medications:  .  levonorgestrel (MIRENA) 20 MCG/24HR IUD, , Intrauterine, Once, Schuman, Christanna R, MD   Family History  Problem Relation Age of Onset  . Hypertension Sister      Social History   Tobacco Use  . Smoking status: Current Every Day Smoker    Packs/day: 1.00    Years: 27.00    Pack years: 27.00    Types: Cigarettes  . Smokeless tobacco: Never Used  . Tobacco comment: .50 currently  Substance Use Topics  . Alcohol use: No  . Drug use: Yes    Types: Marijuana    Allergies as of 09/02/2019  . (No Known Allergies)     Imaging Studies: Reviewed  Assessment and Plan:   JACEE ENERSON is a 39 y.o. female with history of bipolar,chronic GERD refractory to PPI status post Nissen fundoplication with repair of hiatal hernia  in 06/2018 is seen for follow-up of chronic GERD, chronic constipation, symptomatic hemorrhoids  Chronic GERD s/p Nissen's fundoplication in 08/4006, s/p laparoscopic paraesophageal hernia repair with Toupet fundoplication and PEG tube for gastropexy at [facility] by Dr. Margart Sickles. Surgery was performed on 08/07/2019. Currently off acid suppressive therapy, asymptomatic   Chronic constipation, well controlled Continue Amitiza 8 MCG 1-2 times daily High-fiber diet    Follow Up Instructions:   I discussed the assessment and treatment plan with the patient. The patient was  provided an opportunity to ask questions and all were answered. The patient agreed with the plan and demonstrated an understanding of the instructions.   The patient was advised to call back or seek an in-person evaluation if the symptoms worsen or if the condition fails to improve as anticipated.  I provided 12 minutes of face-to-face time during this encounter.   Follow up as needed   Cephas Darby, MD

## 2020-01-19 ENCOUNTER — Emergency Department: Payer: Medicaid Other

## 2020-01-19 ENCOUNTER — Emergency Department
Admission: EM | Admit: 2020-01-19 | Discharge: 2020-01-19 | Disposition: A | Discharge status: Home / Self Care | Payer: Medicaid Other | Attending: Emergency Medicine | Admitting: Emergency Medicine

## 2020-01-19 ENCOUNTER — Encounter: Payer: Self-pay | Admitting: Emergency Medicine

## 2020-01-19 ENCOUNTER — Other Ambulatory Visit: Payer: Self-pay

## 2020-01-19 DIAGNOSIS — F17210 Nicotine dependence, cigarettes, uncomplicated: Secondary | ICD-10-CM | POA: Insufficient documentation

## 2020-01-19 DIAGNOSIS — K922 Gastrointestinal hemorrhage, unspecified: Secondary | ICD-10-CM | POA: Diagnosis not present

## 2020-01-19 DIAGNOSIS — R042 Hemoptysis: Secondary | ICD-10-CM | POA: Diagnosis not present

## 2020-01-19 DIAGNOSIS — Z79899 Other long term (current) drug therapy: Secondary | ICD-10-CM | POA: Diagnosis not present

## 2020-01-19 DIAGNOSIS — J45909 Unspecified asthma, uncomplicated: Secondary | ICD-10-CM | POA: Diagnosis not present

## 2020-01-19 DIAGNOSIS — K529 Noninfective gastroenteritis and colitis, unspecified: Secondary | ICD-10-CM

## 2020-01-19 DIAGNOSIS — K5130 Ulcerative (chronic) rectosigmoiditis without complications: Secondary | ICD-10-CM | POA: Insufficient documentation

## 2020-01-19 DIAGNOSIS — R059 Cough, unspecified: Secondary | ICD-10-CM | POA: Diagnosis present

## 2020-01-19 LAB — URINALYSIS, COMPLETE (UACMP) WITH MICROSCOPIC
Bilirubin Urine: NEGATIVE
Glucose, UA: NEGATIVE mg/dL
Ketones, ur: 20 mg/dL — AB
Leukocytes,Ua: NEGATIVE
Nitrite: NEGATIVE
Protein, ur: NEGATIVE mg/dL
Specific Gravity, Urine: 1.042 — ABNORMAL HIGH (ref 1.005–1.030)
pH: 6 (ref 5.0–8.0)

## 2020-01-19 LAB — LIPASE, BLOOD: Lipase: 21 U/L (ref 11–51)

## 2020-01-19 LAB — COMPREHENSIVE METABOLIC PANEL
ALT: 16 U/L (ref 0–44)
AST: 14 U/L — ABNORMAL LOW (ref 15–41)
Albumin: 4.3 g/dL (ref 3.5–5.0)
Alkaline Phosphatase: 55 U/L (ref 38–126)
Anion gap: 11 (ref 5–15)
BUN: 17 mg/dL (ref 6–20)
CO2: 24 mmol/L (ref 22–32)
Calcium: 9.7 mg/dL (ref 8.9–10.3)
Chloride: 106 mmol/L (ref 98–111)
Creatinine, Ser: 0.98 mg/dL (ref 0.44–1.00)
GFR calc Af Amer: >60 mL/min (ref >60)
GFR calc non Af Amer: >60 mL/min (ref >60)
Glucose, Bld: 99 mg/dL (ref 70–99)
Potassium: 3.8 mmol/L (ref 3.5–5.1)
Sodium: 141 mmol/L (ref 135–145)
Total Bilirubin: 1 mg/dL (ref 0.3–1.2)
Total Protein: 7.2 g/dL (ref 6.5–8.1)

## 2020-01-19 LAB — CBC
HCT: 41.3 % (ref 36.0–46.0)
Hemoglobin: 14.6 g/dL (ref 12.0–15.0)
MCH: 32.7 pg (ref 26.0–34.0)
MCHC: 35.4 g/dL (ref 30.0–36.0)
MCV: 92.4 fL (ref 80.0–100.0)
Platelets: 279 10*3/uL (ref 150–400)
RBC: 4.47 MIL/uL (ref 3.87–5.11)
RDW: 12 % (ref 11.5–15.5)
WBC: 9.8 10*3/uL (ref 4.0–10.5)
nRBC: 0 % (ref 0.0–0.2)

## 2020-01-19 LAB — SAMPLE TO BLOOD BANK

## 2020-01-19 MED ORDER — DICYCLOMINE HCL 10 MG PO CAPS: 10.0000 mg | ORAL_CAPSULE | Freq: Four times a day (QID) | ORAL | 0 refills | Status: DC

## 2020-01-19 MED ORDER — METRONIDAZOLE 500 MG PO TABS: 500.0000 mg | ORAL_TABLET | Freq: Two times a day (BID) | ORAL | 0 refills | Status: DC

## 2020-01-19 MED ORDER — IOHEXOL 300 MG/ML  SOLN
100.0000 mL | Freq: Once | INTRAMUSCULAR | Status: AC | PRN
  Administered 2020-01-19: 100 mL via INTRAVENOUS
  Filled 2020-01-19: qty 100

## 2020-01-19 MED ORDER — METRONIDAZOLE 500 MG PO TABS
500.0000 mg | ORAL_TABLET | Freq: Once | ORAL | Status: AC
  Administered 2020-01-19: 500 mg via ORAL
  Filled 2020-01-19: qty 1

## 2020-01-19 MED ORDER — CIPROFLOXACIN HCL 500 MG PO TABS: 500.0000 mg | ORAL_TABLET | Freq: Two times a day (BID) | ORAL | 0 refills | Status: DC

## 2020-01-19 MED ORDER — CIPROFLOXACIN HCL 500 MG PO TABS
500.0000 mg | ORAL_TABLET | Freq: Once | ORAL | Status: AC
  Administered 2020-01-19: 500 mg via ORAL
  Filled 2020-01-19: qty 1

## 2020-01-19 NOTE — ED Notes (Signed)
Pt alert and oriented X 4, stable for discharge. RR even and unlabored, color WNL. Discussed discharge instructions and follow-up as directed. Discharge medications discussed if provided. Pt had opportunity to ask questions if necessary and RN to provide patient/family eduction.

## 2020-01-19 NOTE — ED Provider Notes (Signed)
[facility] Emergency Department Provider Note  ____________________________________________  Time seen: Approximately 9:11 PM  I have reviewed the triage vital signs and the nursing notes.   HISTORY  Chief Complaint Hemoptysis and Rectal Bleeding    HPI Victoria Pierce is a 39 y.o. female who presents the emergency department for evaluation of hemoptysis, heavier than normal.,  Rectal bleeding.  Patient states over the past week she has had some spotty hemoptysis.  She says that there is some flecks of blood in her sputum.  She has had similar symptoms in the past with bronchitis but states that typically this occurs during the cold weather months.  Patient denies any shortness of breath.  No fevers or chills, nasal congestion, sore throat.  Patient is also endorsing  dark red bleeding with bowel movements.  She states that she has had multiple GI issues, has had diverticulitis, GERD, hiatal hernias, ulcerative colitis.  She has had similar symptoms in the past.  Patient has had some left-sided abdominal pain with the rectal bleeding.  She is scheduled to see GI for a colonoscopy in 10 days.  Patient is also complaining of heavier exercycle than normal.  She states that typically she has scant menstrual bleeding, has actually had to use pads at this time due to her bleeding.  However there is no significant vaginal bleeding.  No history of thrombocytopenia.  No medications for his complaint prior to arrival.        Past Medical History:  Diagnosis Date  . Anxiety   . Asthma   . Bipolar 1 disorder ([place])   . Depression   . Diverticulitis   . GERD (gastroesophageal reflux disease)   . Hernia, abdominal   . Hiatal hernia   . Ulcerative colitis   . Vaginal septum     Patient Active Problem List   Diagnosis Date Noted  . Tobacco use disorder 08/11/2019  . S/P repair of paraesophageal hernia 08/07/2019  . History of Nissen fundoplication 65/53/7482  .  Chronic pelvic pain in female 06/14/2018  . Dysmenorrhea 06/14/2018  . Menorrhagia with regular cycle 06/14/2018  . Contracture of finger joint, 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  . Closed displaced fracture of neck of fifth metacarpal bone of left hand 03/07/2018  . Chronic GERD   . Anxiety     Past Surgical History:  Procedure Laterality Date  . CLOSED REDUCTION METACARPAL WITH PERCUTANEOUS PINNING Left 03/07/2018   Procedure: CLOSED REDUCTION METACARPAL WITH PERCUTANEOUS PINNING-5th metacarpal;  Surgeon: Corky Mull, MD;  Location: [facility] ORS;  Service: Orthopedics;  Laterality: Left;  . COLONOSCOPY WITH PROPOFOL N/A 05/14/2018   Procedure: COLONOSCOPY WITH PROPOFOL;  Surgeon: Lin Landsman, MD;  Location: [facility] ENDOSCOPY;  Service: Gastroenterology;  Laterality: N/A;  . ESOPHAGOGASTRODUODENOSCOPY (EGD) WITH PROPOFOL N/A 05/14/2018   Procedure: ESOPHAGOGASTRODUODENOSCOPY (EGD) WITH PROPOFOL;  Surgeon: Lin Landsman, MD;  Location: [facility] ENDOSCOPY;  Service: Gastroenterology;  Laterality: N/A;  . ESOPHAGOGASTRODUODENOSCOPY (EGD) WITH PROPOFOL N/A 03/06/2019   Procedure: ESOPHAGOGASTRODUODENOSCOPY (EGD) WITH PROPOFOL;  Surgeon: Lin Landsman, MD;  Location: [facility] ENDOSCOPY;  Service: Gastroenterology;  Laterality: N/A;  . HARDWARE REMOVAL Left 04/03/2018   Procedure: LEFT HAND FIFTH METACARPAL PIN REMOVAL;  Surgeon: Corky Mull, MD;  Location: [place];  Service: Orthopedics;  Laterality: Left;  . ROBOTIC ASSISTED LAPAROSCOPIC NISSEN FUNDOPLICATION N/A 10/21/8673   Procedure: ROBOTIC ASSISTED LAPAROSCOPIC NISSEN FUNDOPLICATION;  Surgeon: Jules Husbands, MD;  Location: [facility] ORS;  Service: General;  Laterality: N/A;  .  TRIGGER FINGER RELEASE Left 06/19/2018   Procedure: EXTENSOR TENOLYSIS WITH CAPSULAR RELEASE OF LEFT LITTLE MCP JOINT;  Surgeon: Corky Mull, MD;  Location: [place];  Service: Orthopedics;  Laterality: Left;  . TUBAL LIGATION      Prior to Admission  medications   Medication Sig Start Date End Date Taking? Authorizing Provider  albuterol (PROVENTIL HFA;VENTOLIN HFA) 108 (90 Base) MCG/ACT inhaler Inhale 2 puffs into the lungs every 6 (six) hours as needed for wheezing or shortness of breath.    [provider]  cariprazine (VRAYLAR) capsule Take 1.5 mg by mouth 2 (two) times daily.    [provider]  ciprofloxacin (CIPRO) 500 MG tablet Take 1 tablet (500 mg total) by mouth 2 (two) times daily for 10 days. 01/19/20 01/29/20  Essance Gatti, Charline Bills, PA-C  cyclobenzaprine (FLEXERIL) 10 MG tablet Take 10 mg by mouth 3 (three) times daily as needed for muscle spasms.     [provider]  dicyclomine (BENTYL) 10 MG capsule Take 1 capsule (10 mg total) by mouth 4 (four) times daily for 14 days. 01/19/20 02/02/20  Sanjit Mcmichael, Charline Bills, PA-C  gabapentin (NEURONTIN) 300 MG capsule Take 900 mg by mouth 2 (two) times daily.     [provider]  hydroxypropyl methylcellulose / hypromellose (ISOPTO TEARS / GONIOVISC) 2.5 % ophthalmic solution Place 2 drops into both eyes 2 (two) times daily as needed for dry eyes. ARTIFICAL TEARS    [provider]  ketotifen ([employer] EYE ITCH RELIEF) 0.025 % ophthalmic solution Place 2 drops into both eyes 2 (two) times daily.     [provider]  lisdexamfetamine (VYVANSE) 40 MG capsule Take by mouth. 07/07/19   [provider]  lubiprostone (AMITIZA) 8 MCG capsule TAKE 1 CAPSULE (8 MCG TOTAL) BY MOUTH 2 (TWO) TIMES DAILY WITH A MEAL. 09/02/19   Lin Landsman, MD  Melatonin 5 MG CAPS Take by mouth.    [provider]  metroNIDAZOLE (FLAGYL) 500 MG tablet Take 1 tablet (500 mg total) by mouth 2 (two) times daily. 01/19/20   Madi Bonfiglio, Charline Bills, PA-C  Multiple Vitamin (MULTIVITAMIN WITH MINERALS) TABS tablet Take 1 tablet by mouth daily.    [provider]  ondansetron (ZOFRAN-ODT) 4 MG disintegrating tablet Take 1 tablet (4 mg total) by mouth every  6 (six) hours as needed for nausea or vomiting. 09/27/18   Tylene Fantasia, PA-C  pantoprazole (PROTONIX) 40 MG tablet TAKE 1 TABLET (40 MG TOTAL) BY MOUTH 2 (TWO) TIMES DAILY BEFORE A MEAL. 04/28/19 07/27/19  Lin Landsman, MD  propranolol (INDERAL) 20 MG tablet Take by mouth. 06/16/19   [provider]  topiramate (TOPAMAX) 50 MG tablet Take 50 mg by mouth 2 (two) times daily.    [provider]  traZODone (DESYREL) 50 MG tablet Take by mouth. 05/28/19   [provider]    Allergies Patient has no known allergies.  Family History  Problem Relation Age of Onset  . Hypertension Sister     Social History Social History   Tobacco Use  . Smoking status: Current Every Day Smoker    Packs/day: 1.00    Years: 27.00    Pack years: 27.00    Types: Cigarettes  . Smokeless tobacco: Never Used  . Tobacco comment: .50 currently  Vaping Use  . Vaping Use: Never used  Substance Use Topics  . Alcohol use: No  . Drug use: Yes    Types: Marijuana  Review of Systems  Constitutional: No fever/chills Eyes: No visual changes. No discharge ENT: No upper respiratory complaints. Cardiovascular: no chest pain. Respiratory: no cough. No SOB. Gastrointestinal: No abdominal pain.  No nausea, no vomiting.  No diarrhea.  No constipation. Genitourinary: Negative for dysuria. No hematuria.  Heavier than normal menstrual cycle Musculoskeletal: Negative for musculoskeletal pain. Skin: Negative for rash, abrasions, lacerations, ecchymosis. Neurological: Negative for headaches, focal weakness or numbness. 10-point ROS otherwise negative.  ____________________________________________   PHYSICAL EXAM:  VITAL SIGNS: ED Triage Vitals  Enc Vitals Group     BP 01/19/20 1851 (!) 147/97     Pulse Rate 01/19/20 1851 79     Resp 01/19/20 1851 20     Temp 01/19/20 1851 98.2 F (36.8 C)     Temp Source 01/19/20 1851 Oral     SpO2 01/19/20 1851 99 %     Weight 01/19/20  1852 158 lb (71.7 kg)     Height 01/19/20 1852 5' 3"  (1.6 m)     Head Circumference --      Peak Flow --      Pain Score 01/19/20 1852 5     Pain Loc --      Pain Edu? --      Excl. in [place]? --      Constitutional: Alert and oriented. Well appearing and in no acute distress. Eyes: Conjunctivae are normal. PERRL. EOMI. Head: Atraumatic. ENT:      Ears:       Nose: No congestion/rhinnorhea.      Mouth/Throat: Mucous membranes are moist.  Neck: No stridor.  Hematological/Lymphatic/Immunilogical: No cervical lymphadenopathy. Cardiovascular: Normal rate, regular rhythm. Normal S1 and S2.  Good peripheral circulation. Respiratory: Normal respiratory effort without tachypnea or retractions. Lungs CTAB. Good air entry to the bases with no decreased or absent breath sounds. Gastrointestinal: No external abdominal findings.  Bowel sounds 4 quadrants.  Soft to palpation all quadrants.  Mildly tender to palpation along the left lower quadrant.  No palpable abnormalities.. No guarding or rigidity. No palpable masses. No distention. No CVA tenderness. Musculoskeletal: Full range of motion to all extremities. No gross deformities appreciated. Neurologic:  Normal speech and language. No gross focal neurologic deficits are appreciated.  Skin:  Skin is warm, dry and intact. No rash noted. Psychiatric: Mood and affect are normal. Speech and behavior are normal. Patient exhibits appropriate insight and judgement.   ____________________________________________   LABS (all labs ordered are listed, but only abnormal results are displayed)  Labs Reviewed  COMPREHENSIVE METABOLIC PANEL - Abnormal; Notable for the following components:      Result Value   AST 14 (*)    All other components within normal limits  URINALYSIS, COMPLETE (UACMP) WITH MICROSCOPIC - Abnormal; Notable for the following components:   Color, Urine YELLOW (*)    APPearance HAZY (*)    Specific Gravity, Urine 1.042 (*)    Hgb  urine dipstick LARGE (*)    Ketones, ur 20 (*)    Bacteria, UA RARE (*)    All other components within normal limits  LIPASE, BLOOD  CBC  SAMPLE TO BLOOD BANK   ____________________________________________  EKG   ____________________________________________  RADIOLOGY I personally viewed and evaluated these images as part of my medical decision making, as well as reviewing the written report by the radiologist.  DG Chest 2 View  Result Date: 01/19/2020 CLINICAL DATA:  Cough, hemoptysis. EXAM: CHEST - 2 VIEW COMPARISON:  Radiograph 09/26/2018. FINDINGS: The cardiomediastinal contours  are normal. The lungs are clear. Pulmonary vasculature is normal. No consolidation, pleural effusion, or pneumothorax. No acute osseous abnormalities are seen. IMPRESSION: Negative radiographs of the chest. Electronically Signed   By: Keith Rake M.D.   On: 01/19/2020 19:34   CT ABDOMEN PELVIS W CONTRAST  Result Date: 01/19/2020 CLINICAL DATA:  Left lower quadrant abdominal pain EXAM: CT ABDOMEN AND PELVIS WITH CONTRAST TECHNIQUE: Multidetector CT imaging of the abdomen and pelvis was performed using the standard protocol following bolus administration of intravenous contrast. CONTRAST:  153m OMNIPAQUE IOHEXOL 300 MG/ML  SOLN COMPARISON:  None. FINDINGS: Lower chest: The visualized heart size within normal limits. No pericardial fluid/thickening. No hiatal hernia. The visualized portions of the lungs are clear. Hepatobiliary: Scattered tiny hypodense lesions are seen throughout the right liver lobe which are too small to further characterize. There is probable focal fatty sparing seen along the falciform ligament.The main portal vein is patent. No evidence of calcified gallstones, gallbladder wall thickening or biliary dilatation. Pancreas: Unremarkable. No pancreatic ductal dilatation or surrounding inflammatory changes. Spleen: Normal in size without focal abnormality. Adrenals/Urinary Tract: Both adrenal  glands appear normal. The kidneys and collecting system appear normal without evidence of urinary tract calculus or hydronephrosis. Bladder is unremarkable. Stomach/Bowel: The stomach, small bowel, are normal appearance. The appendix is unremarkable. There appears to be mild wall thickening seen around the distal rectum with mild presacral edema. No loculated fluid collections. The remainder of the colon is unremarkable. Vascular/Lymphatic: There are no enlarged mesenteric, retroperitoneal, or pelvic lymph nodes. No significant vascular findings are present. Reproductive: IUD seen within the endometrial canal. Other: No evidence of abdominal wall mass or hernia. Musculoskeletal: No acute or significant osseous findings. IMPRESSION: Findings which could be suggestive of mild proctocolitis. Electronically Signed   By: BPrudencio PairM.D.   On: 01/19/2020 22:01    ____________________________________________    PROCEDURES  Procedure(s) performed:    Procedures    Medications  ciprofloxacin (CIPRO) tablet 500 mg (has no administration in time range)  metroNIDAZOLE (FLAGYL) tablet 500 mg (has no administration in time range)  iohexol (OMNIPAQUE) 300 MG/ML solution 100 mL (100 mLs Intravenous Contrast Given 01/19/20 2149)     ____________________________________________   INITIAL IMPRESSION / ASSESSMENT AND PLAN / ED COURSE  Pertinent labs & imaging results that were available during my care of the patient were reviewed by me and considered in my medical decision making (see chart for details).  Review of the [place] CSRS was performed in [place] of the [place] drugs.           Patient's diagnosis is consistent with hemoptysis, proctocolitis with lower GI bleeding.  Patient presented to the emergency department complaining of specks of blood in her chronic cough.  Patient states that she has had similar symptoms with bronchitis but typically only has  bronchitis during the winter months.  No increasing cough.  No other URI symptoms.  Patient states that she will see occasional spots of blood.  Chest x-ray revealed no consolidation or lesions.  At this time I feel the symptoms are from chronic cough.  Patient also had rectal bleeding.  She has a history of same, has ulcerative colitis, diverticulosis with recurrent diverticulitis.  Imaging revealed no diverticulitis but did reveal proctocolitis.  I will treat the patient with antibiotics for same.  Patient already has a follow-up with her GI doctor.  I have given return precautions if bleeding worsens, pain worsens.  Otherwise labs  are stable at this time, patient will treated with antibiotics and follow-up with GI.Marland Kitchen  Patient is given ED precautions to return to the ED for any worsening or new symptoms.     ____________________________________________  FINAL CLINICAL IMPRESSION(S) / ED DIAGNOSES  Final diagnoses:  Cough with hemoptysis  Proctocolitis  Lower GI bleed      NEW MEDICATIONS STARTED DURING THIS VISIT:  ED Discharge Orders         Ordered    metroNIDAZOLE (FLAGYL) 500 MG tablet  2 times daily        01/19/20 2306    ciprofloxacin (CIPRO) 500 MG tablet  2 times daily        01/19/20 2306    dicyclomine (BENTYL) 10 MG capsule  4 times daily        01/19/20 2306              This chart was dictated using voice recognition software/Dragon. Despite best efforts to proofread, errors can occur which can change the meaning. Any change was purely unintentional.    Darletta Moll, PA-C 01/19/20 2307    Lavonia Drafts, MD 01/22/20 1246

## 2020-01-19 NOTE — ED Triage Notes (Signed)
Pt presents to ED via POV with c/o "real streaky, real thin" blood in sputum that she is coughing up, pt states coughing up blood x approx 1 week. Pt also c/o rectal bleeding, states is dark red. States notes bright red splatter on bowl after bowel movement. Pt also c/o "heavier than normal" period. VSS in triage. PT A&O x4, ambulatory with steady gait.

## 2020-01-20 ENCOUNTER — Telehealth: Payer: Self-pay | Admitting: Gastroenterology

## 2020-01-20 NOTE — Telephone Encounter (Signed)
Patient called and wants Dr. Marius Ditch to review her chart before her 01/29/20 appt. She stated that she was seen at the ER and wanted to be seen sooner. Per patient

## 2020-01-21 NOTE — Telephone Encounter (Signed)
I reviewed her chart.  Her hemoglobin is normal.  Also, reviewed CT scan findings.  I will see her for follow-up as scheduled  Victoria Pierce

## 2020-01-28 ENCOUNTER — Other Ambulatory Visit: Payer: Self-pay

## 2020-01-29 ENCOUNTER — Other Ambulatory Visit: Payer: Self-pay

## 2020-01-29 ENCOUNTER — Ambulatory Visit (INDEPENDENT_AMBULATORY_CARE_PROVIDER_SITE_OTHER): Payer: Medicaid Other | Admitting: Gastroenterology

## 2020-01-29 ENCOUNTER — Encounter: Payer: Self-pay | Admitting: Gastroenterology

## 2020-01-29 VITALS — BP 117/81 | HR 101 | Temp 98.5°F | Ht 63.0 in | Wt 158.2 lb

## 2020-01-29 DIAGNOSIS — R634 Abnormal weight loss: Secondary | ICD-10-CM

## 2020-01-29 DIAGNOSIS — R0602 Shortness of breath: Secondary | ICD-10-CM

## 2020-01-29 DIAGNOSIS — R112 Nausea with vomiting, unspecified: Secondary | ICD-10-CM

## 2020-01-29 DIAGNOSIS — Z720 Tobacco use: Secondary | ICD-10-CM

## 2020-01-29 DIAGNOSIS — K625 Hemorrhage of anus and rectum: Secondary | ICD-10-CM

## 2020-01-29 DIAGNOSIS — K6289 Other specified diseases of anus and rectum: Secondary | ICD-10-CM

## 2020-01-29 NOTE — Progress Notes (Signed)
Cephas Darby, MD [address]  [place]  [place], [place] [address]  Main: [phone]  Fax: [phone] Pager: [phone]   Primary Care Physician: [name], MD  Primary Gastroenterologist:  Dr. Cephas Darby  Chief Complaint  Patient presents with  . Hospitalization Follow-up    seen for lower GI bleed. Went to hospital for diarrhea with blood in stool   . Abdominal Pain    lower abdominal pain that comes and goes   . Nausea    all the time but can not vomit  . Constipation    Has not had a bowel movement in 6 days just a lot of gas     HPI: Victoria Pierce is a 39 y.o. female with history of chronic refractory GERD, status post Nissen fundoplication, s/p slippage, underwent repeat laparoscopic paraesophageal hernia repair with Toupet fundoplication and PEG tube for gastropexy. Surgery was performed on 08/07/2019.  This was performed by Dr. Cammie Sickle at [facility].  She also has history of symptomatic hemorrhoids, IBS with diarrhea alternating with constipation.    Patient presented to [facility] ER on 2020-01-19 secondary to severe nausea associated with vomiting as well as coughing up blood approximately for 1 week, complaining of rectal bleeding that was dark red.  She reports having regular bowel habits.  Last bowel movement was 6 days ago, reports feeling very gassy and every time she has a bowel movement she expels explosive gas.  She denies abdominal bloating, lower abdominal pain.  She does have left upper quadrant discomfort which she underwent repair of the paraesophageal hernia.  Patient thinks she is actually not coughing but she feels severely nauseous followed by vomiting which she has struggled to let the gastric contents out since the repair of the paraesophageal hernia second time.  She underwent CT abdomen pelvis with contrast in the ER patient not reveal any acute intra-abdominal pathology other than mild proctocolitis.  She does have IUD in place.   Patient also reports lack of appetite and she lost about 34 pounds since October 2020.  Her hemoglobin was normal, CMP and lipase were normal.  Patient continues to smoke.  She is on Topamax 50 mg twice daily Patient is also concerned about increased spontaneous bruising   Current Outpatient Medications  Medication Sig Dispense Refill  . albuterol (PROVENTIL HFA;VENTOLIN HFA) 108 (90 Base) MCG/ACT inhaler Inhale 2 puffs into the lungs every 6 (six) hours as needed for wheezing or shortness of breath.    . cyclobenzaprine (FLEXERIL) 10 MG tablet Take 10 mg by mouth 3 (three) times daily as needed for muscle spasms.     Marland Kitchen dicyclomine (BENTYL) 10 MG capsule Take 1 capsule (10 mg total) by mouth 4 (four) times daily for 14 days. 56 capsule 0  . gabapentin (NEURONTIN) 300 MG capsule Take 900 mg by mouth 2 (two) times daily.     Marland Kitchen gabapentin (NEURONTIN) 600 MG tablet Take 600 mg by mouth 3 (three) times daily.    . hydroxypropyl methylcellulose / hypromellose (ISOPTO TEARS / GONIOVISC) 2.5 % ophthalmic solution Place 2 drops into both eyes 2 (two) times daily as needed for dry eyes. ARTIFICAL TEARS    . ketotifen ([employer]S EYE ITCH RELIEF) 0.025 % ophthalmic solution Place 2 drops into both eyes 2 (two) times daily.     Marland Kitchen lamoTRIgine (LAMICTAL) 100 MG tablet Take 100 mg by mouth daily.    Marland Kitchen lamoTRIgine (LAMICTAL) 25 MG tablet Take by mouth.    Marland Kitchen  lubiprostone (AMITIZA) 8 MCG capsule TAKE 1 CAPSULE (8 MCG TOTAL) BY MOUTH 2 (TWO) TIMES DAILY WITH A MEAL. 120 capsule 1  . montelukast (SINGULAIR) 10 MG tablet Take 10 mg by mouth daily.    . Multiple Vitamin (MULTIVITAMIN WITH MINERALS) TABS tablet Take 1 tablet by mouth daily.    . ondansetron (ZOFRAN-ODT) 4 MG disintegrating tablet Take 1 tablet (4 mg total) by mouth every 6 (six) hours as needed for nausea or vomiting. 40 tablet 0  . propranolol (INDERAL) 20 MG tablet Take by mouth.    . topiramate (TOPAMAX) 50 MG tablet Take 50 mg by mouth 2 (two) times  daily.    . traZODone (DESYREL) 50 MG tablet Take by mouth.    Marland Kitchen VYVANSE 70 MG capsule Take 70 mg by mouth every morning.     Current Facility-Administered Medications  Medication Dose Route Frequency Provider Last Rate Last Admin  . levonorgestrel (MIRENA) 20 MCG/24HR IUD   Intrauterine Once Homero Fellers, MD        Allergies as of 01/29/2020  . (No Known Allergies)    NSAIDs: None  Antiplts/Anticoagulants/Anti thrombotics: None  GI procedures:  EGD 05/14/2018 - Normal duodenal bulb and second portion of the duodenum. Biopsied. - 4 cm hiatal hernia. - Erythematous mucosa in the gastric body and antrum. Biopsied. - Non-bleeding erosive gastropathy. - Esophagogastric landmarks identified. - Normal esophagus. Biopsied.  Colonoscopy 05/14/2018 - The examined portion of the ileum was normal. - Normal mucosa in the entire examined colon. Biopsied. - Diverticulosis in the sigmoid colon and in the descending colon. - The distal rectum and anal verge are normal on retroflexion view.  DIAGNOSIS:  A. DUODENUM; BIOPSY:  - FEW AREAS OF VILLOUS BLUNTING WITHOUT INCREASED INTRAEPITHELIAL  LYMPHOCYTES.  - SEE COMMENT.   B. STOMACH, RANDOM; BIOPSIES:  - BENIGN GASTRIC MUCOSA WITH FOCAL FIBROSIS IN LAMINA PROPRIA AND  REACTIVE/REGENERATIVE APPEARING GLANDS.  - NEGATIVE FOR ACTIVE MUCOSAL INFLAMMATION, H. PYLORI, INTESTINAL  METAPLASIA AND DYSPLASIA.  - SEE COMMENT.   C. ESOPHAGUS; BIOPSY:  - NO SIGNIFICANT PATHOLOGIC FEATURES.   D. COLON, RANDOM; BIOPSIES:  - NEGATIVE FOR ACTIVE MUCOSAL INFLAMMATION, GRANULOMAS AND  LYMPHOCYTIC/MICROSCOPIC COLITIS.  - FEATURES OF IDIOPATHIC CHRONIC INFLAMMATORY BOWEL DISEASE (ULCERATIVE  COLITIS) ARE NOT SEEN.  - NO DYSPLASIA OR MALIGNANCY IDENTIFIED.  ROS:  General: Negative for anorexia, weight loss, fever, chills, fatigue, weakness. ENT: Negative for hoarseness, difficulty swallowing , nasal congestion. CV: Negative for chest  pain, angina, palpitations, dyspnea on exertion, peripheral edema.  Respiratory: Negative for dyspnea at rest, dyspnea on exertion, cough, sputum, wheezing.  GI: See history of present illness. GU:  Negative for dysuria, hematuria, urinary incontinence, urinary frequency, nocturnal urination.  Endo: Negative for unusual weight change.    Physical Examination:   BP 117/81 (BP Location: Left Arm, Patient Position: Sitting, Cuff Size: Normal)   Pulse (!) 101   Temp 98.5 F (36.9 C) (Oral)   Ht 5' 3"  (1.6 m)   Wt 158 lb 4 oz (71.8 kg)   BMI 28.03 kg/m   General: Well-nourished, well-developed in no acute distress.  Eyes: No icterus. Conjunctivae pink. Mouth: Oropharyngeal mucosa moist and pink , no lesions erythema or exudate. Lungs: Clear to auscultation bilaterally. Non-labored. Heart: Regular rate and rhythm, no murmurs rubs or gallops.  Abdomen: Bowel sounds are normal, nontender, nondistended, no hepatosplenomegaly or masses, no hernia , no rebound or guarding.   Rectal exam: Not performed Extremities: No lower extremity edema. No clubbing  or deformities. Neuro: Alert and oriented x 3.  Grossly intact. Skin: Warm and dry, no jaundice, multiple areas of bruises in upper and lower extremities.   Psych: Alert and cooperative, normal mood and affect.   Imaging Studies: Reviewed  Assessment and Plan:   CACI ORREN is a 39 y.o. female with history of bipolar, chronic GERD refractory to PPI status post Nissen fundoplication with repair of hiatal hernia s/p slippage, s/p repair is seen for follow-up of rectal bleeding, nausea, vomiting, unintentional weight loss  Nausea and vomiting with unintentional weight loss Recommend repeat EGD to evaluate the wrap and possible balloon dilation if the wrap is diet She had villous blunting with intraepithelial lymphocytes in duodenum on previous endoscopy, however celiac serologies were negative, normal total IgA levels.  We will repeat  gastric and duodenal biopsies If EGD is unremarkable, will check for pancreatic insufficiency  Chronic constipation:  Increase Amitiza 8 MCG 2 times a day Highly encouraged to quit smoking  Rectal bleeding CT revealed mild proctocolitis Recommend flexible sigmoidoscopy  Chronic tobacco use, cough ?  Hemoptysis, scarring of the lungs Referral to pulmonary   Follow up in 6 weeks   Dr Sherri Sear, MD

## 2020-02-02 ENCOUNTER — Other Ambulatory Visit: Payer: Self-pay

## 2020-02-02 ENCOUNTER — Other Ambulatory Visit
Admission: RE | Admit: 2020-02-02 | Discharge: 2020-02-02 | Disposition: A | Discharge status: Home / Self Care | Payer: Medicaid Other | Source: Ambulatory Visit | Attending: Gastroenterology | Admitting: Gastroenterology

## 2020-02-02 DIAGNOSIS — Z20822 Contact with and (suspected) exposure to covid-19: Secondary | ICD-10-CM | POA: Diagnosis not present

## 2020-02-02 DIAGNOSIS — Z01812 Encounter for preprocedural laboratory examination: Secondary | ICD-10-CM | POA: Insufficient documentation

## 2020-02-03 ENCOUNTER — Telehealth: Payer: Self-pay

## 2020-02-03 LAB — SARS CORONAVIRUS 2 (TAT 6-24 HRS): SARS Coronavirus 2: NEGATIVE

## 2020-02-03 NOTE — Telephone Encounter (Signed)
Called and left a message for call back. Gave patient the instructions at her office visit on 01/29/2020

## 2020-02-03 NOTE — Telephone Encounter (Signed)
-----   Message from Storm Frisk, [place] sent at 02/03/2020  4:47 PM EDT ----- Regarding: procedure ? Victoria Pierce,  Patient left voicemail saying she has questions about procedure. Call her at [phone]    -Jovon

## 2020-02-03 NOTE — Telephone Encounter (Signed)
Patient wanted to know If she needed to take any of her medications in the morning. Informed patient to just take them when she got home from her procedure tomorrow

## 2020-02-04 ENCOUNTER — Other Ambulatory Visit: Payer: Self-pay

## 2020-02-04 ENCOUNTER — Ambulatory Visit: Payer: Medicaid Other | Admitting: Anesthesiology

## 2020-02-04 ENCOUNTER — Encounter
Admission: RE | Disposition: A | Discharge status: Home / Self Care | Payer: Self-pay | Source: Home / Self Care | Attending: Gastroenterology

## 2020-02-04 ENCOUNTER — Ambulatory Visit
Admission: RE | Admit: 2020-02-04 | Discharge: 2020-02-04 | Disposition: A | Discharge status: Home / Self Care | Payer: Medicaid Other | Attending: Gastroenterology | Admitting: Gastroenterology

## 2020-02-04 ENCOUNTER — Encounter: Payer: Self-pay | Admitting: Gastroenterology

## 2020-02-04 DIAGNOSIS — R112 Nausea with vomiting, unspecified: Secondary | ICD-10-CM

## 2020-02-04 DIAGNOSIS — K3189 Other diseases of stomach and duodenum: Secondary | ICD-10-CM | POA: Insufficient documentation

## 2020-02-04 DIAGNOSIS — Z8711 Personal history of peptic ulcer disease: Secondary | ICD-10-CM | POA: Insufficient documentation

## 2020-02-04 DIAGNOSIS — F319 Bipolar disorder, unspecified: Secondary | ICD-10-CM | POA: Insufficient documentation

## 2020-02-04 DIAGNOSIS — K644 Residual hemorrhoidal skin tags: Secondary | ICD-10-CM | POA: Diagnosis not present

## 2020-02-04 DIAGNOSIS — F17210 Nicotine dependence, cigarettes, uncomplicated: Secondary | ICD-10-CM | POA: Diagnosis not present

## 2020-02-04 DIAGNOSIS — K5730 Diverticulosis of large intestine without perforation or abscess without bleeding: Secondary | ICD-10-CM | POA: Insufficient documentation

## 2020-02-04 DIAGNOSIS — Z9889 Other specified postprocedural states: Secondary | ICD-10-CM | POA: Diagnosis not present

## 2020-02-04 DIAGNOSIS — K625 Hemorrhage of anus and rectum: Secondary | ICD-10-CM | POA: Diagnosis present

## 2020-02-04 DIAGNOSIS — J45909 Unspecified asthma, uncomplicated: Secondary | ICD-10-CM | POA: Diagnosis not present

## 2020-02-04 DIAGNOSIS — Z79899 Other long term (current) drug therapy: Secondary | ICD-10-CM | POA: Insufficient documentation

## 2020-02-04 DIAGNOSIS — R1110 Vomiting, unspecified: Secondary | ICD-10-CM

## 2020-02-04 DIAGNOSIS — K259 Gastric ulcer, unspecified as acute or chronic, without hemorrhage or perforation: Secondary | ICD-10-CM | POA: Insufficient documentation

## 2020-02-04 HISTORY — PX: FLEXIBLE SIGMOIDOSCOPY: SHX5431

## 2020-02-04 HISTORY — PX: ESOPHAGOGASTRODUODENOSCOPY (EGD) WITH PROPOFOL: SHX5813

## 2020-02-04 LAB — POCT PREGNANCY, URINE: Preg Test, Ur: NEGATIVE

## 2020-02-04 SURGERY — ESOPHAGOGASTRODUODENOSCOPY (EGD) WITH PROPOFOL
Anesthesia: General

## 2020-02-04 MED ORDER — GLYCOPYRROLATE 0.2 MG/ML IJ SOLN
INTRAMUSCULAR | Status: AC
  Filled 2020-02-04: qty 1

## 2020-02-04 MED ORDER — LIDOCAINE 2% (20 MG/ML) 5 ML SYRINGE
INTRAMUSCULAR | Status: DC | PRN
  Administered 2020-02-04: 25 mg via INTRAVENOUS

## 2020-02-04 MED ORDER — PROPOFOL 500 MG/50ML IV EMUL
INTRAVENOUS | Status: DC | PRN
  Administered 2020-02-04: 120 ug/kg/min via INTRAVENOUS

## 2020-02-04 MED ORDER — PROPOFOL 10 MG/ML IV BOLUS
INTRAVENOUS | Status: DC | PRN
  Administered 2020-02-04: 70 mg via INTRAVENOUS
  Administered 2020-02-04: 30 mg via INTRAVENOUS

## 2020-02-04 MED ORDER — SODIUM CHLORIDE 0.9 % IV SOLN
INTRAVENOUS | Status: DC
  Administered 2020-02-04: 1000 mL via INTRAVENOUS

## 2020-02-04 MED ORDER — LIDOCAINE HCL (PF) 2 % IJ SOLN
INTRAMUSCULAR | Status: AC
  Filled 2020-02-04: qty 5

## 2020-02-04 MED ORDER — MIDAZOLAM HCL 2 MG/2ML IJ SOLN
INTRAMUSCULAR | Status: AC
  Filled 2020-02-04: qty 2

## 2020-02-04 MED ORDER — MIDAZOLAM HCL 5 MG/5ML IJ SOLN
INTRAMUSCULAR | Status: DC | PRN
  Administered 2020-02-04: 2 mg via INTRAVENOUS

## 2020-02-04 NOTE — Op Note (Signed)
[facility] Gastroenterology Patient Name: Victoria Pierce Procedure Date: 02/04/2020 12:12 PM MRN: [id number] Account #: [id number] Date of Birth: [birth year] Admit Type: Outpatient Age: 39 Room: [facility] ENDO ROOM 4 Gender: Female Note Status: Finalized Procedure:             Upper GI endoscopy Indications:           Nausea with vomiting Providers:             Lin Landsman MD, MD Referring MD:          Edmonia Lynch. Aycock MD (Referring MD) Medicines:             General Anesthesia Complications:         No immediate complications. Estimated blood loss: None. Procedure:             Pre-Anesthesia Assessment:                        - Prior to the procedure, a History and Physical was                         performed, and patient medications and allergies were                         reviewed. The patient is competent. The risks and                         benefits of the procedure and the sedation options and                         risks were discussed with the patient. All questions                         were answered and informed consent was obtained.                         Patient identification and proposed procedure were                         verified by the physician, the nurse, the                         anesthesiologist, the anesthetist and the technician                         in the pre-procedure area in the procedure room in the                         endoscopy suite. Mental Status Examination: alert and                         oriented. Airway Examination: normal oropharyngeal                         airway and neck mobility. Respiratory Examination:                         clear to auscultation. CV Examination: normal.  Prophylactic Antibiotics: The patient does not require                         prophylactic antibiotics. Prior Anticoagulants: The                         patient has taken no previous anticoagulant or                          antiplatelet agents. ASA Grade Assessment: II - A                         patient with mild systemic disease. After reviewing                         the risks and benefits, the patient was deemed in                         satisfactory condition to undergo the procedure. The                         anesthesia plan was to use general anesthesia.                         Immediately prior to administration of medications,                         the patient was re-assessed for adequacy to receive                         sedatives. The heart rate, respiratory rate, oxygen                         saturations, blood pressure, adequacy of pulmonary                         ventilation, and response to care were monitored                         throughout the procedure. The physical status of the                         patient was re-assessed after the procedure.                        After obtaining informed consent, the endoscope was                         passed under direct vision. Throughout the procedure,                         the patient's blood pressure, pulse, and oxygen                         saturations were monitored continuously. The Endoscope                         was introduced through the mouth, and advanced to the  second part of duodenum. The upper GI endoscopy was                         accomplished without difficulty. The patient tolerated                         the procedure well. Findings:      The duodenal bulb and second portion of the duodenum were normal.      Multiple dispersed diminutive erosions with no bleeding and no stigmata       of recent bleeding were found in the entire examined stomach. Biopsies       were taken with a cold forceps for Helicobacter pylori testing.      Evidence of a 811 degree fundoplication was found in the cardia. The       wrap appeared intact. This was traversed.      The  gastroesophageal junction and examined esophagus were normal.      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 Impression:            - Normal duodenal bulb and second portion of the                         duodenum.                        - Erosive gastropathy with no bleeding and no stigmata                         of recent bleeding. Biopsied.                        - A 572 degree fundoplication was found. The wrap                         appears intact.                        - Normal gastroesophageal junction and esophagus. Recommendation:        - Discharge patient to home (with escort).                        - Resume previous diet today.                        - Await pathology results. Procedure Code(s):     --- Professional ---                        [phone], Esophagogastroduodenoscopy, flexible,                         transoral; with biopsy, single or multiple Diagnosis Code(s):     --- Professional ---                        K31.89, Other diseases of stomach and duodenum                        Z98.890, Other specified postprocedural states  R11.2, Nausea with vomiting, unspecified CPT copyright 2019 American Medical Association. All rights reserved. The codes documented in this report are preliminary and upon coder review may  be revised to meet current compliance requirements. Dr. Ulyess Mort Lin Landsman MD, MD 02/04/2020 12:30:33 PM This report has been signed electronically. Number of Addenda: 0 Note Initiated On: 02/04/2020 12:12 PM Estimated Blood Loss:  Estimated blood loss: none.      [facility]

## 2020-02-04 NOTE — H&P (Signed)
Cephas Darby, MD [address]  [place]  [place], [place] [address]  Main: [phone]  Fax: [phone] Pager: [phone]  Primary Care Physician:  [name], MD Primary Gastroenterologist:  Dr. Cephas Darby  Pre-Procedure History & Physical: HPI:  Victoria Pierce is a 39 y.o. female is here for an endoscopy and flexible sigmoidoscopy.   Past Medical History:  Diagnosis Date  . Anxiety   . Asthma   . Bipolar 1 disorder ([place])   . Depression   . Diverticulitis   . GERD (gastroesophageal reflux disease)   . Hernia, abdominal   . Hiatal hernia   . Ulcerative colitis   . Vaginal septum     Past Surgical History:  Procedure Laterality Date  . CLOSED REDUCTION METACARPAL WITH PERCUTANEOUS PINNING Left 03/07/2018   Procedure: CLOSED REDUCTION METACARPAL WITH PERCUTANEOUS PINNING-5th metacarpal;  Surgeon: Corky Mull, MD;  Location: [facility] ORS;  Service: Orthopedics;  Laterality: Left;  . COLONOSCOPY WITH PROPOFOL N/A 05/14/2018   Procedure: COLONOSCOPY WITH PROPOFOL;  Surgeon: Lin Landsman, MD;  Location: [facility] ENDOSCOPY;  Service: Gastroenterology;  Laterality: N/A;  . ESOPHAGOGASTRODUODENOSCOPY (EGD) WITH PROPOFOL N/A 05/14/2018   Procedure: ESOPHAGOGASTRODUODENOSCOPY (EGD) WITH PROPOFOL;  Surgeon: Lin Landsman, MD;  Location: [facility] ENDOSCOPY;  Service: Gastroenterology;  Laterality: N/A;  . ESOPHAGOGASTRODUODENOSCOPY (EGD) WITH PROPOFOL N/A 03/06/2019   Procedure: ESOPHAGOGASTRODUODENOSCOPY (EGD) WITH PROPOFOL;  Surgeon: Lin Landsman, MD;  Location: [facility] ENDOSCOPY;  Service: Gastroenterology;  Laterality: N/A;  . FRACTURE SURGERY    . HARDWARE REMOVAL Left 04/03/2018   Procedure: LEFT HAND FIFTH METACARPAL PIN REMOVAL;  Surgeon: Corky Mull, MD;  Location: [place];  Service: Orthopedics;  Laterality: Left;  . HERNIA REPAIR    . ROBOTIC ASSISTED LAPAROSCOPIC NISSEN FUNDOPLICATION N/A 06/30/4006   Procedure: ROBOTIC ASSISTED  LAPAROSCOPIC NISSEN FUNDOPLICATION;  Surgeon: Jules Husbands, MD;  Location: [facility] ORS;  Service: General;  Laterality: N/A;  . TRIGGER FINGER RELEASE Left 06/19/2018   Procedure: EXTENSOR TENOLYSIS WITH CAPSULAR RELEASE OF LEFT LITTLE MCP JOINT;  Surgeon: Corky Mull, MD;  Location: [place];  Service: Orthopedics;  Laterality: Left;  . TUBAL LIGATION      Prior to Admission medications   Medication Sig Start Date End Date Taking? Authorizing Provider  gabapentin (NEURONTIN) 300 MG capsule Take 900 mg by mouth 2 (two) times daily.    Yes [provider]  gabapentin (NEURONTIN) 600 MG tablet Take 600 mg by mouth 3 (three) times daily. 12/24/19  Yes [provider]  hydroxypropyl methylcellulose / hypromellose (ISOPTO TEARS / GONIOVISC) 2.5 % ophthalmic solution Place 2 drops into both eyes 2 (two) times daily as needed for dry eyes. ARTIFICAL TEARS   Yes [provider]  ketotifen ([employer] EYE ITCH RELIEF) 0.025 % ophthalmic solution Place 2 drops into both eyes 2 (two) times daily.    Yes [provider]  lamoTRIgine (LAMICTAL) 100 MG tablet Take 100 mg by mouth daily. 01/10/20  Yes [provider]  lamoTRIgine (LAMICTAL) 25 MG tablet Take by mouth. 11/20/19  Yes [provider]  lubiprostone (AMITIZA) 8 MCG capsule TAKE 1 CAPSULE (8 MCG TOTAL) BY MOUTH 2 (TWO) TIMES DAILY WITH A MEAL. 09/02/19  Yes [place], Tally Due, MD  Multiple Vitamin (MULTIVITAMIN WITH MINERALS) TABS tablet Take 1 tablet by mouth daily.   Yes [provider]  propranolol (INDERAL) 20 MG tablet Take by mouth. 06/16/19  Yes [provider]  topiramate (  TOPAMAX) 50 MG tablet Take 50 mg by mouth 2 (two) times daily.   Yes [provider]  traZODone (DESYREL) 50 MG tablet Take by mouth. 05/28/19  Yes [provider]  VYVANSE 70 MG capsule Take 70 mg by mouth every morning. 01/16/20  Yes [provider]  albuterol (PROVENTIL  HFA;VENTOLIN HFA) 108 (90 Base) MCG/ACT inhaler Inhale 2 puffs into the lungs every 6 (six) hours as needed for wheezing or shortness of breath.    [provider]  cyclobenzaprine (FLEXERIL) 10 MG tablet Take 10 mg by mouth 3 (three) times daily as needed for muscle spasms.     [provider]  dicyclomine (BENTYL) 10 MG capsule Take 1 capsule (10 mg total) by mouth 4 (four) times daily for 14 days. 01/19/20 02/02/20  Cuthriell, Charline Bills, PA-C  montelukast (SINGULAIR) 10 MG tablet Take 10 mg by mouth daily. Patient not taking: Reported on 02/04/2020 11/22/19   [provider]  ondansetron (ZOFRAN-ODT) 4 MG disintegrating tablet Take 1 tablet (4 mg total) by mouth every 6 (six) hours as needed for nausea or vomiting. 09/27/18   Tylene Fantasia, PA-C    Allergies as of 01/29/2020  . (No Known Allergies)    Family History  Problem Relation Age of Onset  . Hypertension Sister     Social History   Socioeconomic History  . Marital status: Single    Spouse name: Not on file  . Number of children: Not on file  . Years of education: Not on file  . Highest education level: Not on file  Occupational History  . Not on file  Tobacco Use  . Smoking status: Current Every Day Smoker    Packs/day: 1.00    Years: 27.00    Pack years: 27.00    Types: Cigarettes  . Smokeless tobacco: Never Used  . Tobacco comment: .50 currently  Vaping Use  . Vaping Use: Never used  Substance and Sexual Activity  . Alcohol use: No  . Drug use: Yes    Types: Marijuana    Comment: used Monday  . Sexual activity: Yes    Birth control/protection: None, I.U.D., Surgical  Other Topics Concern  . Not on file  Social History Narrative  . Not on file   Social Determinants of Health   Financial Resource Strain:   . Difficulty of Paying Living Expenses: Not on file  Food Insecurity:   . Worried About Charity fundraiser in the Last Year: Not on file  . Ran Out of Food in the Last  Year: Not on file  Transportation Needs:   . Lack of Transportation (Medical): Not on file  . Lack of Transportation (Non-Medical): Not on file  Physical Activity:   . Days of Exercise per Week: Not on file  . Minutes of Exercise per Session: Not on file  Stress:   . Feeling of Stress : Not on file  Social Connections:   . Frequency of Communication with Friends and Family: Not on file  . Frequency of Social Gatherings with Friends and Family: Not on file  . Attends Religious Services: Not on file  . Active Member of Clubs or Organizations: Not on file  . Attends Archivist Meetings: Not on file  . Marital Status: Not on file  Intimate Partner Violence:   . Fear of Current or Ex-Partner: Not on file  . Emotionally Abused: Not on file  . Physically Abused: Not on file  . Sexually  Abused: Not on file    Review of Systems: See HPI, otherwise negative ROS  Physical Exam: BP 135/90   Pulse 96   Temp (!) 97 F (36.1 C) (Temporal)   Resp 18   Ht 5' 3"  (1.6 m)   Wt 70.5 kg   SpO2 100%   BMI 27.51 kg/m  General:   Alert,  pleasant and cooperative in NAD Head:  Normocephalic and atraumatic. Neck:  Supple; no masses or thyromegaly. Lungs:  Clear throughout to auscultation.    Heart:  Regular rate and rhythm. Abdomen:  Soft, nontender and nondistended. Normal bowel sounds, without guarding, and without rebound.   Neurologic:  Alert and  oriented x4;  grossly normal neurologically.  Impression/Plan: AZYAH FLETT is here for an endoscopy and flexible sigmoidoscopy to be performed for nausea, vomiting, rectal bleeding  Risks, benefits, limitations, and alternatives regarding  endoscopy and flexible sigmoidoscopy have been reviewed with the patient.  Questions have been answered.  All parties agreeable.   Sherri Sear, MD  02/04/2020, 11:17 AM

## 2020-02-04 NOTE — Anesthesia Postprocedure Evaluation (Signed)
Anesthesia Post Note  Patient: Victoria Pierce  Procedure(s) Performed: ESOPHAGOGASTRODUODENOSCOPY (EGD) WITH PROPOFOL (N/A ) FLEXIBLE SIGMOIDOSCOPY (N/A )  Patient location during evaluation: Endoscopy Anesthesia Type: General Level of consciousness: awake and alert and oriented Pain management: pain level controlled Vital Signs Assessment: post-procedure vital signs reviewed and stable Respiratory status: spontaneous breathing, nonlabored ventilation and respiratory function stable Cardiovascular status: blood pressure returned to baseline and stable Postop Assessment: no signs of nausea or vomiting Anesthetic complications: no   No complications documented.   Last Vitals:  Vitals:   02/04/20 1259 02/04/20 1309  BP: (!) 128/96 136/85  Pulse: 97 91  Resp: 15 16  Temp:    SpO2: 100% 100%    Last Pain:  Vitals:   02/04/20 1259  TempSrc:   PainSc: 6                  Akshita Italiano

## 2020-02-04 NOTE — Anesthesia Preprocedure Evaluation (Signed)
Anesthesia Evaluation  Patient identified by MRN, date of birth, ID band Patient awake    Reviewed: Allergy & Precautions, NPO status , Patient's Chart, lab work & pertinent test results  History of Anesthesia Complications Negative for: history of anesthetic complications  Airway Mallampati: II  TM Distance: >3 FB Neck ROM: Full    Dental  (+) Poor Dentition   Pulmonary asthma , Current SmokerPatient did not abstain from smoking.,    breath sounds clear to auscultation- rhonchi (-) wheezing      Cardiovascular Exercise Tolerance: Good (-) hypertension(-) CAD, (-) Past MI, (-) Cardiac Stents and (-) CABG  Rhythm:Regular Rate:Normal - Systolic murmurs and - Diastolic murmurs    Neuro/Psych neg Seizures PSYCHIATRIC DISORDERS Anxiety Depression Bipolar Disorder negative neurological ROS     GI/Hepatic Neg liver ROS, hiatal hernia, PUD, GERD  ,  Endo/Other  negative endocrine ROSneg diabetes  Renal/GU negative Renal ROS     Musculoskeletal negative musculoskeletal ROS (+)   Abdominal (+) - obese,   Peds  Hematology negative hematology ROS (+)   Anesthesia Other Findings Past Medical History: No date: Anxiety No date: Asthma No date: Bipolar 1 disorder (HCC) No date: Depression No date: Diverticulitis No date: GERD (gastroesophageal reflux disease) No date: Hernia, abdominal No date: Hiatal hernia No date: Ulcerative colitis No date: Vaginal septum   Reproductive/Obstetrics                             Anesthesia Physical Anesthesia Plan  ASA: II  Anesthesia Plan: General   Post-op Pain Management:    Induction: Intravenous  PONV Risk Score and Plan: 1 and Propofol infusion  Airway Management Planned: Natural Airway  Additional Equipment:   Intra-op Plan:   Post-operative Plan:   Informed Consent: I have reviewed the patients History and Physical, chart, labs and  discussed the procedure including the risks, benefits and alternatives for the proposed anesthesia with the patient or authorized representative who has indicated his/her understanding and acceptance.     Dental advisory given  Plan Discussed with: CRNA and Anesthesiologist  Anesthesia Plan Comments:         Anesthesia Quick Evaluation

## 2020-02-04 NOTE — Transfer of Care (Signed)
Immediate Anesthesia Transfer of Care Note  Patient: Victoria Pierce  Procedure(s) Performed: ESOPHAGOGASTRODUODENOSCOPY (EGD) WITH PROPOFOL (N/A ) FLEXIBLE SIGMOIDOSCOPY (N/A )  Patient Location: Endoscopy Unit  Anesthesia Type:General  Level of Consciousness: awake and alert   Airway & Oxygen Therapy: Patient Spontanous Breathing  Post-op Assessment: Report given to RN and Post -op Vital signs reviewed and stable  Post vital signs: Reviewed  Last Vitals:  Vitals Value Taken Time  BP    Temp    Pulse    Resp    SpO2      Last Pain:  Vitals:   02/04/20 1030  TempSrc: Temporal         Complications: No complications documented.

## 2020-02-04 NOTE — Op Note (Signed)
[facility] Gastroenterology Patient Name: Victoria Pierce Procedure Date: 02/04/2020 12:10 PM MRN: [id number] Account #: [id number] Date of Birth: [birth year] Admit Type: Outpatient Age: 39 Room: [facility] ENDO ROOM 4 Gender: Female Note Status: Finalized Procedure:             Flexible Sigmoidoscopy Indications:           Rectal hemorrhage Providers:             Lin Landsman MD, MD Referring MD:          Edmonia Lynch. Aycock MD (Referring MD) Medicines:             General Anesthesia Complications:         No immediate complications. Estimated blood loss: None. Procedure:             Pre-Anesthesia Assessment:                        - Prior to the procedure, a History and Physical was                         performed, and patient medications and allergies were                         reviewed. The patient is competent. The risks and                         benefits of the procedure and the sedation options and                         risks were discussed with the patient. All questions                         were answered and informed consent was obtained.                         Patient identification and proposed procedure were                         verified by the physician, the nurse, the                         anesthesiologist, the anesthetist and the technician                         in the pre-procedure area in the procedure room in the                         endoscopy suite. Mental Status Examination: alert and                         oriented. Airway Examination: normal oropharyngeal                         airway and neck mobility. Respiratory Examination:                         clear to auscultation. CV Examination: normal.  Prophylactic Antibiotics: The patient does not require                         prophylactic antibiotics. Prior Anticoagulants: The                         patient has taken no previous anticoagulant or                          antiplatelet agents. ASA Grade Assessment: II - A                         patient with mild systemic disease. After reviewing                         the risks and benefits, the patient was deemed in                         satisfactory condition to undergo the procedure. The                         anesthesia plan was to use general anesthesia.                         Immediately prior to administration of medications,                         the patient was re-assessed for adequacy to receive                         sedatives. The heart rate, respiratory rate, oxygen                         saturations, blood pressure, adequacy of pulmonary                         ventilation, and response to care were monitored                         throughout the procedure. The physical status of the                         patient was re-assessed after the procedure.                        After obtaining informed consent, the scope was passed                         under direct vision. The Endoscope was introduced                         through the anus and advanced to the the splenic                         flexure. The flexible sigmoidoscopy was accomplished                         without difficulty. The patient tolerated the  procedure well. The quality of the bowel preparation                         was adequate. Findings:      The perianal and digital rectal examinations were normal. Pertinent       negatives include normal sphincter tone and no palpable rectal lesions.      Multiple diverticula were found in the sigmoid colon and descending       colon.      Non-bleeding external hemorrhoids were found during retroflexion. The       hemorrhoids were large, with stigmata of recent bleeding. Impression:            - Diverticulosis in the sigmoid colon and in the                         descending colon.                        - Non-bleeding  external hemorrhoids, source of rectal                         bleeding.                        - No specimens collected. Recommendation:        - Discharge patient to home (with escort).                        - Resume previous diet today.                        - Return to my office as previously scheduled to                         discuss about hemorrhoid ligation. Procedure Code(s):     --- Professional ---                        [phone], Sigmoidoscopy, flexible; diagnostic, including                         collection of specimen(s) by brushing or washing, when                         performed (separate procedure) Diagnosis Code(s):     --- Professional ---                        K64.4, Residual hemorrhoidal skin tags                        K62.5, Hemorrhage of anus and rectum                        K57.30, Diverticulosis of large intestine without                         perforation or abscess without bleeding CPT copyright 2019 American Medical Association. All rights reserved. The codes documented in this report are preliminary and upon coder review may  be revised to meet current compliance requirements. Dr. Ulyess Mort  Lin Landsman MD, MD 02/04/2020 12:37:50 PM This report has been signed electronically. Number of Addenda: 0 Note Initiated On: 02/04/2020 12:10 PM Total Procedure Duration: 0 hours 3 minutes 11 seconds  Estimated Blood Loss:  Estimated blood loss: none.      [facility]

## 2020-02-05 ENCOUNTER — Encounter: Payer: Self-pay | Admitting: Gastroenterology

## 2020-02-05 LAB — SURGICAL PATHOLOGY

## 2020-02-06 ENCOUNTER — Encounter: Payer: Self-pay | Admitting: Gastroenterology

## 2020-02-09 ENCOUNTER — Telehealth: Payer: Self-pay

## 2020-02-09 DIAGNOSIS — K625 Hemorrhage of anus and rectum: Secondary | ICD-10-CM

## 2020-02-09 DIAGNOSIS — R112 Nausea with vomiting, unspecified: Secondary | ICD-10-CM

## 2020-02-09 DIAGNOSIS — K6289 Other specified diseases of anus and rectum: Secondary | ICD-10-CM

## 2020-02-09 DIAGNOSIS — R634 Abnormal weight loss: Secondary | ICD-10-CM

## 2020-02-09 NOTE — Telephone Encounter (Signed)
Patient called and left a voicemail about questions about her Endoscopy path results. Informed patient that everything came back normal and she called in a medication to the pharmacy to see if this helps with your nausea and vomiting. Patient verbalized understanding. She states she will come today to our office to get the stool test done. Order stool

## 2020-02-29 ENCOUNTER — Other Ambulatory Visit: Payer: Self-pay | Admitting: Gastroenterology

## 2020-02-29 DIAGNOSIS — K5904 Chronic idiopathic constipation: Secondary | ICD-10-CM

## 2020-03-09 ENCOUNTER — Other Ambulatory Visit: Payer: Self-pay

## 2020-03-15 ENCOUNTER — Other Ambulatory Visit: Payer: Self-pay

## 2020-03-15 ENCOUNTER — Ambulatory Visit: Payer: Medicaid Other | Admitting: Gastroenterology

## 2020-03-24 ENCOUNTER — Ambulatory Visit: Payer: Medicaid Other | Admitting: Pulmonary Disease

## 2020-06-04 ENCOUNTER — Other Ambulatory Visit: Payer: Self-pay | Admitting: Gastroenterology

## 2020-06-04 DIAGNOSIS — K5904 Chronic idiopathic constipation: Secondary | ICD-10-CM

## 2020-06-18 ENCOUNTER — Ambulatory Visit (INDEPENDENT_AMBULATORY_CARE_PROVIDER_SITE_OTHER): Payer: Medicaid Other | Admitting: Obstetrics and Gynecology

## 2020-06-18 ENCOUNTER — Other Ambulatory Visit: Payer: Self-pay

## 2020-06-18 ENCOUNTER — Encounter: Payer: Self-pay | Admitting: Obstetrics and Gynecology

## 2020-06-18 VITALS — BP 114/80 | Ht 63.0 in | Wt 154.0 lb

## 2020-06-18 DIAGNOSIS — Z0000 Encounter for general adult medical examination without abnormal findings: Secondary | ICD-10-CM | POA: Diagnosis not present

## 2020-06-18 DIAGNOSIS — R079 Chest pain, unspecified: Secondary | ICD-10-CM | POA: Insufficient documentation

## 2020-06-18 DIAGNOSIS — R0602 Shortness of breath: Secondary | ICD-10-CM

## 2020-06-18 DIAGNOSIS — N6320 Unspecified lump in the left breast, unspecified quadrant: Secondary | ICD-10-CM | POA: Diagnosis not present

## 2020-06-18 DIAGNOSIS — N644 Mastodynia: Secondary | ICD-10-CM

## 2020-06-18 DIAGNOSIS — Z01419 Encounter for gynecological examination (general) (routine) without abnormal findings: Secondary | ICD-10-CM

## 2020-06-18 DIAGNOSIS — Z1239 Encounter for other screening for malignant neoplasm of breast: Secondary | ICD-10-CM

## 2020-06-18 HISTORY — DX: Chest pain, unspecified: R07.9

## 2020-06-18 NOTE — Progress Notes (Signed)
Gynecology Annual Exam   PCP: [name], MD Chief Complaint:  Chief Complaint  Patient presents with  . Gynecologic Exam    Tenderness and swelling in left breast x 4-5 months   History of Present Illness: Patient is a 40 y.o. [id number] presents for annual exam. The patient presents with concern today for L breast tenderness with three L breast lumps. Patient first noted breast tenderness several months ago. Patient states she feels her left breast has become significantly larger than her right breast. Patient reports her daughter, a CMA, completed a breast exam on her and found three areas on the L breast consistent with lumps. Patient additionally reports L sides rib/chest pain that radiates throughout chest. Patient states this has been present intermittently since before she underwent hiatal hernia repair last year. Patient notes that the chest/rib pain often occurs when she is feeling anxious and is "sharp/stabbing." Patient is also concerned today about shortness of breath and wheezing. Patient reports that she has been smoking for 30 years and currently smokes 1ppd. Patient states that she has dealt with SOB previously but had Covid-19 in January and has since had worsening SOB with wheezing.  LMP: Patient's last menstrual period was 05/31/2020 (approximate). Average Interval: regular, 28 days Duration of flow: 6-7 days Heavy Menses: no Clots: no Intermenstrual Bleeding: no Postcoital Bleeding: no Dysmenorrhea: no  The patient is not currently sexually active. She currently uses IUD for contraception. She denies dyspareunia.  There is no notable family history of breast or ovarian cancer in her family.  The patient wears seatbelts: yes.   The patient has regular exercise: no.    The patient reports current symptoms of depression.  Patient reports a significant mental health history but states she is currently managed by a psychiatrist for these symptoms.   Review of  Systems: Review of Systems  Constitutional: Negative.   HENT: Negative.   Eyes: Negative.   Respiratory: Positive for shortness of breath and wheezing.   Cardiovascular: Positive for chest pain. Negative for palpitations and leg swelling.  Gastrointestinal: Positive for nausea. Negative for diarrhea and vomiting.  Genitourinary:       Breast lumps, tenderness  Musculoskeletal: Negative.   Skin: Negative.   Neurological: Negative.   Endo/Heme/Allergies: Bruises/bleeds easily.  Psychiatric/Behavioral: Positive for depression. The patient is nervous/anxious.     Past Medical History:  Patient Active Problem List   Diagnosis Date Noted  . Shortness of breath 06/18/2020  . Chest pain of uncertain etiology 62/83/1517  . Left breast lump 06/18/2020  . Non-intractable vomiting   . Rectal bleeding   . Tobacco use disorder 08/11/2019  . S/P repair of paraesophageal hernia 08/07/2019  . History of Nissen fundoplication 61/60/7371  . Chronic pelvic pain in female 06/14/2018  . Dysmenorrhea 06/14/2018  . Menorrhagia with regular cycle 06/14/2018  . Contracture of finger joint, 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  . Closed displaced fracture of neck of fifth metacarpal bone of left hand 03/07/2018  . Chronic GERD   . Anxiety     Past Surgical History:  Past Surgical History:  Procedure Laterality Date  . CLOSED REDUCTION METACARPAL WITH PERCUTANEOUS PINNING Left 03/07/2018   Procedure: CLOSED REDUCTION METACARPAL WITH PERCUTANEOUS PINNING-5th metacarpal;  Surgeon: Corky Mull, MD;  Location: [facility] ORS;  Service: Orthopedics;  Laterality: Left;  . COLONOSCOPY WITH PROPOFOL N/A 05/14/2018   Procedure: COLONOSCOPY WITH PROPOFOL;  Surgeon: Lin Landsman, MD;  Location: [facility] ENDOSCOPY;  Service: Gastroenterology;  Laterality: N/A;  .  ESOPHAGOGASTRODUODENOSCOPY (EGD) WITH PROPOFOL N/A 05/14/2018   Procedure: ESOPHAGOGASTRODUODENOSCOPY (EGD) WITH PROPOFOL;  Surgeon: Lin Landsman, MD;  Location: [place];  Service: Gastroenterology;  Laterality: N/A;  . ESOPHAGOGASTRODUODENOSCOPY (EGD) WITH PROPOFOL N/A 03/06/2019   Procedure: ESOPHAGOGASTRODUODENOSCOPY (EGD) WITH PROPOFOL;  Surgeon: Lin Landsman, MD;  Location: [facility] ENDOSCOPY;  Service: Gastroenterology;  Laterality: N/A;  . ESOPHAGOGASTRODUODENOSCOPY (EGD) WITH PROPOFOL N/A 02/04/2020   Procedure: ESOPHAGOGASTRODUODENOSCOPY (EGD) WITH PROPOFOL;  Surgeon: Lin Landsman, MD;  Location: [facility] ENDOSCOPY;  Service: Gastroenterology;  Laterality: N/A;  . FLEXIBLE SIGMOIDOSCOPY N/A 02/04/2020   Procedure: FLEXIBLE SIGMOIDOSCOPY;  Surgeon: Lin Landsman, MD;  Location: [facility] ENDOSCOPY;  Service: Gastroenterology;  Laterality: N/A;  . FRACTURE SURGERY    . HARDWARE REMOVAL Left 04/03/2018   Procedure: LEFT HAND FIFTH METACARPAL PIN REMOVAL;  Surgeon: Corky Mull, MD;  Location: [place];  Service: Orthopedics;  Laterality: Left;  . HERNIA REPAIR    . ROBOTIC ASSISTED LAPAROSCOPIC NISSEN FUNDOPLICATION N/A 8/88/2800   Procedure: ROBOTIC ASSISTED LAPAROSCOPIC NISSEN FUNDOPLICATION;  Surgeon: Jules Husbands, MD;  Location: [facility] ORS;  Service: General;  Laterality: N/A;  . TRIGGER FINGER RELEASE Left 06/19/2018   Procedure: EXTENSOR TENOLYSIS WITH CAPSULAR RELEASE OF LEFT LITTLE MCP JOINT;  Surgeon: Corky Mull, MD;  Location: [place];  Service: Orthopedics;  Laterality: Left;  . TUBAL LIGATION      Gynecologic History:  Patient's last menstrual period was 05/31/2020 (approximate). Contraception: IUD Last Pap: 06/14/2018 Results were: NIL and HR HPV negative   Obstetric History: [id number]  Family History:  Family History  Problem Relation Age of Onset  . Hypertension Sister     Social History:  Social History   Socioeconomic History  . Marital status: Single    Spouse name: Not on file  . Number of children: Not on file  . Years of education: Not on file  . Highest education level: Not on  file  Occupational History  . Not on file  Tobacco Use  . Smoking status: Current Every Day Smoker    Packs/day: 1.00    Years: 27.00    Pack years: 27.00    Types: Cigarettes  . Smokeless tobacco: Never Used  . Tobacco comment: .50 currently  Vaping Use  . Vaping Use: Never used  Substance and Sexual Activity  . Alcohol use: No  . Drug use: Yes    Types: Marijuana    Comment: used Monday  . Sexual activity: Not Currently    Birth control/protection: I.U.D., Surgical    Comment: Mirena  Other Topics Concern  . Not on file  Social History Narrative  . Not on file   Social Determinants of Health   Financial Resource Strain: Not on file  Food Insecurity: Not on file  Transportation Needs: Not on file  Physical Activity: Not on file  Stress: Not on file  Social Connections: Not on file  Intimate Partner Violence: Not on file    Allergies:  No Known Allergies  Medications: Prior to Admission medications   Medication Sig Start Date End Date Taking? Authorizing Provider  ADDERALL XR 30 MG 24 hr capsule Take 30 mg by mouth every morning. 05/25/20  Yes [provider]  cyclobenzaprine (FLEXERIL) 10 MG tablet Take 10 mg by mouth 3 (three) times daily as needed for muscle spasms.    Yes [provider]  gabapentin (NEURONTIN) 600 MG tablet Take 600 mg by mouth 3 (three) times daily. 06/04/20  Yes  [provider]  hydroxypropyl methylcellulose / hypromellose (ISOPTO TEARS / GONIOVISC) 2.5 % ophthalmic solution Place 2 drops into both eyes 2 (two) times daily as needed for dry eyes. ARTIFICAL TEARS   Yes [provider]  ketotifen (ZADITOR) 0.025 % ophthalmic solution Place 2 drops into both eyes 2 (two) times daily.    Yes [provider]  lamoTRIgine (LAMICTAL) 150 MG tablet Take 150 mg by mouth daily. 03/09/20  Yes [provider]  lubiprostone (AMITIZA) 8 MCG capsule TAKE 1 CAPSULE BY MOUTH 2 TIMES DAILY WITH A MEAL. 06/07/20   Yes Vanga, Tally Due, MD  Multiple Vitamin (MULTIVITAMIN WITH MINERALS) TABS tablet Take 1 tablet by mouth daily.   Yes [provider]  naproxen (NAPROSYN) 500 MG tablet Take 500 mg by mouth 2 (two) times daily. 02/14/20  Yes [provider]  ondansetron (ZOFRAN-ODT) 4 MG disintegrating tablet Take 1 tablet (4 mg total) by mouth every 6 (six) hours as needed for nausea or vomiting. 09/27/18  Yes Tylene Fantasia, PA-C  propranolol (INDERAL) 20 MG tablet Take by mouth. 06/16/19  Yes [provider]  QUEtiapine (SEROQUEL) 50 MG tablet TAKE 1 ORAL TABLETS AT BEDTIME 05/25/20  Yes [provider]  topiramate (TOPAMAX) 50 MG tablet Take 50 mg by mouth 2 (two) times daily.   Yes [provider]    Physical Exam Vitals: Blood pressure 114/80, height 5' 3"  (1.6 m), weight 154 lb (69.9 kg), last menstrual period 05/31/2020.  General: NAD HEENT: normocephalic, anicteric Thyroid: no enlargement, no palpable nodules Pulmonary: No increased work of breathing, CTAB Cardiovascular: RRR, distal pulses 2+ Breast: Breast appear symmetrical, tenderness primarily to L breast, palpable masses on L breast a 9 o'clock (2x2cm) and 12 o'clock (1x1cm) positions, both masses mobile with distinct margins, no skin or nipple retraction present, no nipple discharge. Axillary lymphadenopathy on R side. No axillary or supraclavicular lymphadenopathy on L side. Abdomen: NABS, soft, non-tender, non-distended.  Umbilicus without lesions.  No hepatomegaly, splenomegaly or masses palpable. No evidence of hernia  Genitourinary:  External: Normal external female genitalia.  Normal urethral meatus, normal Bartholin's and Skene's glands.    Vagina: Normal vaginal mucosa, no evidence of prolapse.    Cervix: Grossly normal in appearance, no bleeding - IUD strings present   Lymphatic: no evidence of inguinal lymphadenopathy Extremities: no edema, erythema, or tenderness Neurologic: Grossly  intact Psychiatric: mood appropriate, affect full  Assessment: 40 y.o. G2P2002 routine annual exam, concern for SOB/wheezing, chest pain - unknown etiology, L-sided breast tenderness with breast lumps (two noted on exam)  Plan: Problem List Items Addressed This Visit      Other   Shortness of breath   Relevant Orders   Ambulatory referral to Pulmonology   Chest pain of uncertain etiology   Relevant Orders   Ambulatory referral to Cardiology   Left breast lump - Primary   Relevant Orders   MM Digital Diagnostic Bilat    Other Visit Diagnoses    Screening breast examination       Routine health maintenance       Encounter for gynecological examination without abnormal finding       Breast tenderness in female         - Breast lumps/tenderness - diagnostic mammogram ordered  -Hx of asthma/COPD - SOB/wheezing - patient has not seen a pulmonologist in "a while," referral placed for further evaluation  -Intermittent chest/rib pain associated with anxiety, prolonged smoking hx - cardiology referral placed  -  STI screening  wasoffered and declined  -  ASCCP guidelines and rational discussed.  Patient opts for every 3 years screening interval  - Contraception - the patient is currently using  IUD.  She is happy with her current form of contraception and plans to continue  - Routine healthcare maintenance including cholesterol, diabetes screening discussed, is managed by PCP - patient scheduled to see PCP in April   Orlie Pollen, [place], MSN [place], [place] 06/18/2020, 3:17 PM

## 2020-06-21 ENCOUNTER — Encounter: Payer: Self-pay | Admitting: Cardiology

## 2020-06-21 ENCOUNTER — Other Ambulatory Visit: Payer: Self-pay

## 2020-06-21 ENCOUNTER — Ambulatory Visit (INDEPENDENT_AMBULATORY_CARE_PROVIDER_SITE_OTHER): Payer: Medicaid Other | Admitting: Cardiology

## 2020-06-21 VITALS — BP 124/84 | HR 86 | Ht 63.0 in | Wt 152.0 lb

## 2020-06-21 DIAGNOSIS — F17200 Nicotine dependence, unspecified, uncomplicated: Secondary | ICD-10-CM | POA: Diagnosis not present

## 2020-06-21 DIAGNOSIS — R072 Precordial pain: Secondary | ICD-10-CM

## 2020-06-21 MED ORDER — IVABRADINE HCL 5 MG PO TABS: 10.0000 mg | ORAL_TABLET | Freq: Once | ORAL | 0 refills | Status: AC

## 2020-06-21 MED ORDER — METOPROLOL TARTRATE 100 MG PO TABS: 100.0000 mg | ORAL_TABLET | Freq: Once | ORAL | 0 refills | Status: DC

## 2020-06-21 NOTE — Patient Instructions (Signed)
Medication Instructions:  Your physician recommends that you continue on your current medications as directed. Please refer to the Current Medication list given to you today. *If you need a refill on your cardiac medications before your next appointment, please call your pharmacy*   Lab Work: None ordered If you have labs (blood work) drawn today and your tests are completely normal, you will receive your results only by: Marland Kitchen MyChart Message (if you have MyChart) OR . A paper copy in the mail If you have any lab test that is abnormal or we need to change your treatment, we will call you to review the results.   Testing/Procedures:  1.  Your physician has requested that you have an echocardiogram. Echocardiography is a painless test that uses sound waves to create images of your heart. It provides your doctor with information about the size and shape of your heart and how well your heart's chambers and valves are working. This procedure takes approximately one hour. There are no restrictions for this procedure.  2.  Your physician has requested that you have cardiac CT. Cardiac computed tomography (CT) is a painless test that uses an x-ray machine to take clear, detailed pictures of your heart.     Your cardiac CT will be scheduled at:  [facility] [address] [place], [place] [address] [phone]   Please arrive 15 mins early for check-in and test prep.  Please follow these instructions carefully (unless otherwise directed):    On the Night Before the Test: . Be sure to Drink plenty of water. . Do not consume any caffeinated/decaffeinated beverages or chocolate 12 hours prior to your test. . Do not take any antihistamines 12 hours prior to your test.    On the Day of the Test: . Drink plenty of water until 1 hour prior to the test. . Do not eat any food 4 hours prior to the test. . You may take your regular medications prior  to the test.  . Take metoprolol (Lopressor) two hours prior to test. . Take Corlanor (Ivabradine) two hours prior to test. . FEMALES- please wear underwire-free bra if available    After the Test: . Drink plenty of water. . After receiving IV contrast, you may experience a mild flushed feeling. This is normal. . On occasion, you may experience a mild rash up to 24 hours after the test. This is not dangerous. If this occurs, you can take Benadryl 25 mg and increase your fluid intake. . If you experience trouble breathing, this can be serious. If it is severe call 911 IMMEDIATELY. If it is mild, please call our office. . If you take any of these medications: Glipizide/Metformin, Avandament, Glucavance, please do not take 48 hours after completing test unless otherwise instructed.   Once we have confirmed authorization from your insurance company, we will call you to set up a date and time for your test. Based on how quickly your insurance processes prior authorizations requests, please allow up to 4 weeks to be contacted for scheduling your Cardiac CT appointment. Be advised that routine Cardiac CT appointments could be scheduled as many as 8 weeks after your provider has ordered it.  For non-scheduling related questions, please contact the cardiac imaging nurse navigator should you have any questions/concerns: Marchia Bond, Cardiac Imaging Nurse Navigator Gordy Clement, Cardiac Imaging Nurse Navigator [facility] Direct Office Dial: [phone]   For scheduling needs, including cancellations and rescheduling,  please call [place], [phone].      Follow-Up: At [facility], you and your health needs are our priority.  As part of our continuing mission to provide you with exceptional heart care, we have created designated Provider Care Teams.  These Care Teams include your primary Cardiologist (physician) and Advanced Practice Providers (APPs -  Physician  Assistants and Nurse Practitioners) who all work together to provide you with the care you need, when you need it.  We recommend signing up for the patient portal called "MyChart".  Sign up information is provided on this After Visit Summary.  MyChart is used to connect with patients for Virtual Visits (Telemedicine).  Patients are able to view lab/test results, encounter notes, upcoming appointments, etc.  Non-urgent messages can be sent to your provider as well.   To learn more about what you can do with MyChart, go to [email or web address].    Your next appointment:   6 week(s)  The format for your next appointment:   In Person  Provider:   Kate Sable, MD   Other Instructions

## 2020-06-21 NOTE — Progress Notes (Signed)
Cardiology Office Note:    Date:  06/21/2020   ID:  KIEARA SCHWARK, DOB 1980/11/19, MRN [id number]  PCP:  [name], MD   [place]  Cardiologist:  Kate Sable, MD  Advanced Practice Provider:  No care team member to display Electrophysiologist:  None       Referring MD: Orlie Pollen, CNM   Chief Complaint  Patient presents with  . New Patient (Initial Visit)    Patient c/o chest pain, upper back pain and shortness of breath. Medications reviewed by the patient verbally.     History of Present Illness:    ARCELIA PALS is a 40 y.o. female with a hx of anxiety, hiatal hernia, GERD, current smoker x30+ years who presents due to chest pain and shortness of breath.  She states having chest pain on and off for some time now.  Has presented multiple times in the ED due to chest pain.  Work-up so far revealed hiatal hernia which patient had surgery for last year.  Symptoms of left-sided chest pain persisted.  Characterizes pain as sharp, not related with exertion, lasting roughly 30 secs. denies any history of heart disease.    Past Medical History:  Diagnosis Date  . Anxiety   . Asthma   . Bipolar 1 disorder ([place])   . Depression   . Diverticulitis   . GERD (gastroesophageal reflux disease)   . Hernia, abdominal   . Hiatal hernia   . Ulcerative colitis   . Vaginal septum     Past Surgical History:  Procedure Laterality Date  . CLOSED REDUCTION METACARPAL WITH PERCUTANEOUS PINNING Left 03/07/2018   Procedure: CLOSED REDUCTION METACARPAL WITH PERCUTANEOUS PINNING-5th metacarpal;  Surgeon: Corky Mull, MD;  Location: [facility] ORS;  Service: Orthopedics;  Laterality: Left;  . COLONOSCOPY WITH PROPOFOL N/A 05/14/2018   Procedure: COLONOSCOPY WITH PROPOFOL;  Surgeon: Lin Landsman, MD;  Location: [facility] ENDOSCOPY;  Service: Gastroenterology;  Laterality: N/A;  . ESOPHAGOGASTRODUODENOSCOPY (EGD) WITH PROPOFOL N/A 05/14/2018    Procedure: ESOPHAGOGASTRODUODENOSCOPY (EGD) WITH PROPOFOL;  Surgeon: Lin Landsman, MD;  Location: [facility] ENDOSCOPY;  Service: Gastroenterology;  Laterality: N/A;  . ESOPHAGOGASTRODUODENOSCOPY (EGD) WITH PROPOFOL N/A 03/06/2019   Procedure: ESOPHAGOGASTRODUODENOSCOPY (EGD) WITH PROPOFOL;  Surgeon: Lin Landsman, MD;  Location: [facility] ENDOSCOPY;  Service: Gastroenterology;  Laterality: N/A;  . ESOPHAGOGASTRODUODENOSCOPY (EGD) WITH PROPOFOL N/A 02/04/2020   Procedure: ESOPHAGOGASTRODUODENOSCOPY (EGD) WITH PROPOFOL;  Surgeon: Lin Landsman, MD;  Location: [facility] ENDOSCOPY;  Service: Gastroenterology;  Laterality: N/A;  . FLEXIBLE SIGMOIDOSCOPY N/A 02/04/2020   Procedure: FLEXIBLE SIGMOIDOSCOPY;  Surgeon: Lin Landsman, MD;  Location: [facility] ENDOSCOPY;  Service: Gastroenterology;  Laterality: N/A;  . FRACTURE SURGERY    . HARDWARE REMOVAL Left 04/03/2018   Procedure: LEFT HAND FIFTH METACARPAL PIN REMOVAL;  Surgeon: Corky Mull, MD;  Location: [facility];  Service: Orthopedics;  Laterality: Left;  . HERNIA REPAIR    . ROBOTIC ASSISTED LAPAROSCOPIC NISSEN FUNDOPLICATION N/A 5/40/0867   Procedure: ROBOTIC ASSISTED LAPAROSCOPIC NISSEN FUNDOPLICATION;  Surgeon: Jules Husbands, MD;  Location: [facility] ORS;  Service: General;  Laterality: N/A;  . TRIGGER FINGER RELEASE Left 06/19/2018   Procedure: EXTENSOR TENOLYSIS WITH CAPSULAR RELEASE OF LEFT LITTLE MCP JOINT;  Surgeon: Corky Mull, MD;  Location: [place];  Service: Orthopedics;  Laterality: Left;  . TUBAL LIGATION      Current Medications: Current Meds  Medication Sig  . ADDERALL XR 30 MG 24 hr capsule Take  30 mg by mouth every morning.  . cyclobenzaprine (FLEXERIL) 10 MG tablet Take 10 mg by mouth 3 (three) times daily as needed for muscle spasms.   Marland Kitchen gabapentin (NEURONTIN) 600 MG tablet Take 600 mg by mouth 3 (three) times daily.  . hydroxypropyl methylcellulose / hypromellose (ISOPTO TEARS / GONIOVISC) 2.5 %  ophthalmic solution Place 2 drops into both eyes 2 (two) times daily as needed for dry eyes. ARTIFICAL TEARS  . ivabradine (CORLANOR) 5 MG TABS tablet Take 2 tablets (10 mg total) by mouth once for 1 dose. Take 2 hours prior to your CT scan.  . ketotifen (ZADITOR) 0.025 % ophthalmic solution Place 2 drops into both eyes 2 (two) times daily.   Marland Kitchen lamoTRIgine (LAMICTAL) 150 MG tablet Take 150 mg by mouth daily.  Marland Kitchen lubiprostone (AMITIZA) 8 MCG capsule TAKE 1 CAPSULE BY MOUTH 2 TIMES DAILY WITH A MEAL.  . metoprolol tartrate (LOPRESSOR) 100 MG tablet Take 1 tablet (100 mg total) by mouth once for 1 dose. Take 2 hours prior to your CT scan.  . Multiple Vitamin (MULTIVITAMIN WITH MINERALS) TABS tablet Take 1 tablet by mouth daily.  . naproxen (NAPROSYN) 500 MG tablet Take 500 mg by mouth 2 (two) times daily.  . ondansetron (ZOFRAN-ODT) 4 MG disintegrating tablet Take 1 tablet (4 mg total) by mouth every 6 (six) hours as needed for nausea or vomiting.  . propranolol (INDERAL) 20 MG tablet Take by mouth.  . QUEtiapine (SEROQUEL) 50 MG tablet TAKE 1 ORAL TABLETS AT BEDTIME  . topiramate (TOPAMAX) 50 MG tablet Take 50 mg by mouth 2 (two) times daily.   Current Facility-Administered Medications for the 06/21/20 encounter (Office Visit) with Kate Sable, MD  Medication  . levonorgestrel (MIRENA) 20 MCG/24HR IUD     Allergies:   Patient has no known allergies.   Social History   Socioeconomic History  . Marital status: Single    Spouse name: Not on file  . Number of children: Not on file  . Years of education: Not on file  . Highest education level: Not on file  Occupational History  . Not on file  Tobacco Use  . Smoking status: Current Every Day Smoker    Packs/day: 1.00    Years: 27.00    Pack years: 27.00    Types: Cigarettes  . Smokeless tobacco: Never Used  . Tobacco comment: .50 currently  Vaping Use  . Vaping Use: Never used  Substance and Sexual Activity  . Alcohol use: No  .  Drug use: Yes    Types: Marijuana    Comment: used Monday  . Sexual activity: Not Currently    Birth control/protection: I.U.D., Surgical    Comment: Mirena  Other Topics Concern  . Not on file  Social History Narrative  . Not on file   Social Determinants of Health   Financial Resource Strain: Not on file  Food Insecurity: Not on file  Transportation Needs: Not on file  Physical Activity: Not on file  Stress: Not on file  Social Connections: Not on file     Family History: The patient's family history includes Hyperlipidemia in her father and mother; Hypertension in her father, mother, and sister.  ROS:   Please see the history of present illness.     All other systems reviewed and are negative.  EKGs/Labs/Other Studies Reviewed:    The following studies were reviewed today:   EKG:  EKG is  ordered today.  The ekg ordered today demonstrates  normal sinus rhythm, normal ECG.  Recent Labs: 01/19/2020: ALT 16; BUN 17; Creatinine, Ser 0.98; Hemoglobin 14.6; Platelets 279; Potassium 3.8; Sodium 141  Recent Lipid Panel No results found for: CHOL, TRIG, HDL, CHOLHDL, VLDL, LDLCALC, LDLDIRECT   Risk Assessment/Calculations:      Physical Exam:    VS:  BP 124/84 (BP Location: Right Arm, Patient Position: Sitting, Cuff Size: Normal)   Pulse 86   Ht 5' 3"  (1.6 m)   Wt 152 lb (68.9 kg)   LMP 05/31/2020 (Approximate)   SpO2 99%   BMI 26.93 kg/m     Wt Readings from Last 3 Encounters:  06/21/20 152 lb (68.9 kg)  06/18/20 154 lb (69.9 kg)  02/04/20 155 lb 5 oz (70.5 kg)     GEN:  Well nourished, well developed in no acute distress HEENT: Normal NECK: No JVD; No carotid bruits LYMPHATICS: No lymphadenopathy CARDIAC: RRR, no murmurs, rubs, gallops RESPIRATORY:  Clear to auscultation without rales, wheezing or rhonchi  ABDOMEN: Soft, non-tender, non-distended MUSCULOSKELETAL:  No edema; No deformity  SKIN: Warm and dry NEUROLOGIC:  Alert and oriented x  3 PSYCHIATRIC:  Normal affect   ASSESSMENT:    1. Precordial pain   2. Smoking    PLAN:    In order of problems listed above:  1. Precordial pain, risk factors of smoking.  Get echocardiogram to evaluate cardiac function, get coronary CTA to evaluate presence of CAD. 2. Current smoker x30 years, cessation advised.  Follow-up after echo and coronary CTA.     Medication Adjustments/Labs and Tests Ordered: Current medicines are reviewed at length with the patient today.  Concerns regarding medicines are outlined above.  Orders Placed This Encounter  Procedures  . CT CORONARY MORPH W/CTA COR W/SCORE W/CA W/CM &/OR WO/CM  . CT CORONARY FRACTIONAL FLOW RESERVE DATA PREP  . CT CORONARY FRACTIONAL FLOW RESERVE FLUID ANALYSIS  . EKG 12-Lead  . ECHOCARDIOGRAM COMPLETE   Meds ordered this encounter  Medications  . metoprolol tartrate (LOPRESSOR) 100 MG tablet    Sig: Take 1 tablet (100 mg total) by mouth once for 1 dose. Take 2 hours prior to your CT scan.    Dispense:  1 tablet    Refill:  0  . ivabradine (CORLANOR) 5 MG TABS tablet    Sig: Take 2 tablets (10 mg total) by mouth once for 1 dose. Take 2 hours prior to your CT scan.    Dispense:  2 tablet    Refill:  0    Patient Instructions  Medication Instructions:  Your physician recommends that you continue on your current medications as directed. Please refer to the Current Medication list given to you today. *If you need a refill on your cardiac medications before your next appointment, please call your pharmacy*   Lab Work: None ordered If you have labs (blood work) drawn today and your tests are completely normal, you will receive your results only by: Marland Kitchen MyChart Message (if you have MyChart) OR . A paper copy in the mail If you have any lab test that is abnormal or we need to change your treatment, we will call you to review the results.   Testing/Procedures:  1.  Your physician has requested that you have an  echocardiogram. Echocardiography is a painless test that uses sound waves to create images of your heart. It provides your doctor with information about the size and shape of your heart and how well your heart's chambers and valves are working.  This procedure takes approximately one hour. There are no restrictions for this procedure.  2.  Your physician has requested that you have cardiac CT. Cardiac computed tomography (CT) is a painless test that uses an x-ray machine to take clear, detailed pictures of your heart.     Your cardiac CT will be scheduled at:  [facility] [address] [place], [place] [address] [phone]   Please arrive 15 mins early for check-in and test prep.  Please follow these instructions carefully (unless otherwise directed):    On the Night Before the Test: . Be sure to Drink plenty of water. . Do not consume any caffeinated/decaffeinated beverages or chocolate 12 hours prior to your test. . Do not take any antihistamines 12 hours prior to your test.    On the Day of the Test: . Drink plenty of water until 1 hour prior to the test. . Do not eat any food 4 hours prior to the test. . You may take your regular medications prior to the test.  . Take metoprolol (Lopressor) two hours prior to test. . Take Corlanor (Ivabradine) two hours prior to test. . FEMALES- please wear underwire-free bra if available    After the Test: . Drink plenty of water. . After receiving IV contrast, you may experience a mild flushed feeling. This is normal. . On occasion, you may experience a mild rash up to 24 hours after the test. This is not dangerous. If this occurs, you can take Benadryl 25 mg and increase your fluid intake. . If you experience trouble breathing, this can be serious. If it is severe call 911 IMMEDIATELY. If it is mild, please call our office. . If you take any of these medications: Glipizide/Metformin,  Avandament, Glucavance, please do not take 48 hours after completing test unless otherwise instructed.   Once we have confirmed authorization from your insurance company, we will call you to set up a date and time for your test. Based on how quickly your insurance processes prior authorizations requests, please allow up to 4 weeks to be contacted for scheduling your Cardiac CT appointment. Be advised that routine Cardiac CT appointments could be scheduled as many as 8 weeks after your provider has ordered it.  For non-scheduling related questions, please contact the cardiac imaging nurse navigator should you have any questions/concerns: Marchia Bond, Cardiac Imaging Nurse Navigator Gordy Clement, Cardiac Imaging Nurse Navigator [facility] Direct Office Dial: [phone]   For scheduling needs, including cancellations and rescheduling, please call [place], [phone].      Follow-Up: At [facility], you and your health needs are our priority.  As part of our continuing mission to provide you with exceptional heart care, we have created designated Provider Care Teams.  These Care Teams include your primary Cardiologist (physician) and Advanced Practice Providers (APPs -  Physician Assistants and Nurse Practitioners) who all work together to provide you with the care you need, when you need it.  We recommend signing up for the patient portal called "MyChart".  Sign up information is provided on this After Visit Summary.  MyChart is used to connect with patients for Virtual Visits (Telemedicine).  Patients are able to view lab/test results, encounter notes, upcoming appointments, etc.  Non-urgent messages can be sent to your provider as well.   To learn more about what you can do with MyChart, go to [email or web address].    Your next appointment:   6 week(s)  The format for your next appointment:   In Person  Provider:   Kate Sable,  MD   Other Instructions      Signed, Kate Sable, MD  06/21/2020 11:55 AM    [employer]

## 2020-06-22 ENCOUNTER — Telehealth: Payer: Self-pay

## 2020-06-22 ENCOUNTER — Other Ambulatory Visit: Payer: Self-pay | Admitting: Obstetrics and Gynecology

## 2020-06-22 DIAGNOSIS — N6320 Unspecified lump in the left breast, unspecified quadrant: Secondary | ICD-10-CM

## 2020-06-22 NOTE — Telephone Encounter (Signed)
Could you let the patient no that I placed the orders again. She should be able to schedule with [facility]. Thanks.

## 2020-06-22 NOTE — Telephone Encounter (Signed)
Pt calling; [facility] can't schedule pt until referral is complete.  [phone]

## 2020-06-22 NOTE — Telephone Encounter (Signed)
Pt aware by detailed vm.

## 2020-06-28 ENCOUNTER — Ambulatory Visit
Admission: RE | Admit: 2020-06-28 | Discharge: 2020-06-28 | Disposition: A | Discharge status: Home / Self Care | Payer: Medicaid Other | Source: Ambulatory Visit | Attending: Obstetrics and Gynecology | Admitting: Obstetrics and Gynecology

## 2020-06-28 ENCOUNTER — Other Ambulatory Visit: Payer: Self-pay

## 2020-06-28 ENCOUNTER — Other Ambulatory Visit: Payer: Self-pay | Admitting: Obstetrics and Gynecology

## 2020-06-28 DIAGNOSIS — R590 Localized enlarged lymph nodes: Secondary | ICD-10-CM

## 2020-06-28 DIAGNOSIS — N6320 Unspecified lump in the left breast, unspecified quadrant: Secondary | ICD-10-CM | POA: Diagnosis present

## 2020-06-28 DIAGNOSIS — R2231 Localized swelling, mass and lump, right upper limb: Secondary | ICD-10-CM

## 2020-06-28 DIAGNOSIS — N6325 Unspecified lump in the left breast, overlapping quadrants: Secondary | ICD-10-CM | POA: Insufficient documentation

## 2020-06-29 ENCOUNTER — Ambulatory Visit: Payer: Medicaid Other

## 2020-06-29 NOTE — Progress Notes (Signed)
This is your pt. She needed an add-on RT breast u/s yesterday and I signed the order for you since you were off. Thanks.

## 2020-07-07 ENCOUNTER — Telehealth (HOSPITAL_COMMUNITY): Payer: Self-pay | Admitting: *Deleted

## 2020-07-07 NOTE — Telephone Encounter (Signed)
Attempted to call patient regarding upcoming cardiac CT appointment. Unable to leave a message on pt's phone. Will attempt again.  Gordy Clement RN Navigator Cardiac Imaging [facility] [phone] Office [phone] Cell

## 2020-07-08 ENCOUNTER — Ambulatory Visit
Admission: RE | Admit: 2020-07-08 | Discharge: 2020-07-08 | Disposition: A | Discharge status: Home / Self Care | Payer: Medicaid Other | Source: Ambulatory Visit | Attending: Cardiology | Admitting: Cardiology

## 2020-07-08 ENCOUNTER — Other Ambulatory Visit: Payer: Self-pay

## 2020-07-08 DIAGNOSIS — R072 Precordial pain: Secondary | ICD-10-CM

## 2020-07-08 MED ORDER — NITROGLYCERIN 0.4 MG SL SUBL
0.8000 mg | SUBLINGUAL_TABLET | Freq: Once | SUBLINGUAL | Status: AC
  Administered 2020-07-08: 0.8 mg via SUBLINGUAL

## 2020-07-08 MED ORDER — METOPROLOL TARTRATE 5 MG/5ML IV SOLN
10.0000 mg | Freq: Once | INTRAVENOUS | Status: AC
  Administered 2020-07-08: 10 mg via INTRAVENOUS

## 2020-07-08 MED ORDER — METOPROLOL TARTRATE 5 MG/5ML IV SOLN: 10.0000 mg | Freq: Once | INTRAVENOUS | Status: DC

## 2020-07-08 MED ORDER — IOHEXOL 350 MG/ML SOLN
75.0000 mL | Freq: Once | INTRAVENOUS | Status: AC | PRN
  Administered 2020-07-08: 75 mL via INTRAVENOUS

## 2020-07-08 MED ORDER — NITROGLYCERIN 0.4 MG SL SUBL
0.4000 mg | SUBLINGUAL_TABLET | Freq: Once | SUBLINGUAL | Status: AC
  Administered 2020-07-08: 0.4 mg via SUBLINGUAL

## 2020-07-08 MED ORDER — SODIUM CHLORIDE 0.9 % IV BOLUS
500.0000 mL | Freq: Once | INTRAVENOUS | Status: AC
  Administered 2020-07-08: 500 mL via INTRAVENOUS

## 2020-07-08 NOTE — Progress Notes (Signed)
Patient tolerated CT well. Drank water and ate peanut butter crackers after.  Encourage to drink water throughout day. Reasons explained and verbalized understanding. Ambulated steady gait.

## 2020-07-13 ENCOUNTER — Other Ambulatory Visit: Payer: Self-pay

## 2020-07-13 ENCOUNTER — Ambulatory Visit (INDEPENDENT_AMBULATORY_CARE_PROVIDER_SITE_OTHER): Payer: Medicaid Other

## 2020-07-13 DIAGNOSIS — R072 Precordial pain: Secondary | ICD-10-CM | POA: Diagnosis not present

## 2020-07-14 LAB — ECHOCARDIOGRAM COMPLETE
AR max vel: 3.01 cm2
AV Area VTI: 2.96 cm2
AV Area mean vel: 2.68 cm2
AV Mean grad: 5 mmHg
AV Peak grad: 8.3 mmHg
Ao pk vel: 1.44 m/s
Area-P 1/2: 2.62 cm2
P 1/2 time: 300 msec
S' Lateral: 2.5 cm

## 2020-07-28 ENCOUNTER — Institutional Professional Consult (permissible substitution): Payer: Medicaid Other | Admitting: Pulmonary Disease

## 2020-08-02 ENCOUNTER — Ambulatory Visit: Payer: Medicaid Other | Admitting: Cardiology

## 2020-08-03 ENCOUNTER — Encounter: Payer: Self-pay | Admitting: Cardiology

## 2020-08-09 ENCOUNTER — Other Ambulatory Visit: Payer: Self-pay

## 2020-08-10 ENCOUNTER — Ambulatory Visit (INDEPENDENT_AMBULATORY_CARE_PROVIDER_SITE_OTHER): Payer: Medicaid Other | Admitting: Gastroenterology

## 2020-08-10 ENCOUNTER — Encounter: Payer: Self-pay | Admitting: Gastroenterology

## 2020-08-10 ENCOUNTER — Other Ambulatory Visit: Payer: Self-pay

## 2020-08-10 VITALS — BP 125/86 | HR 98 | Temp 98.0°F | Ht 63.0 in | Wt 151.1 lb

## 2020-08-10 DIAGNOSIS — R140 Abdominal distension (gaseous): Secondary | ICD-10-CM

## 2020-08-10 DIAGNOSIS — K641 Second degree hemorrhoids: Secondary | ICD-10-CM | POA: Diagnosis not present

## 2020-08-10 DIAGNOSIS — K529 Noninfective gastroenteritis and colitis, unspecified: Secondary | ICD-10-CM | POA: Diagnosis not present

## 2020-08-10 MED ORDER — HYDROCORTISONE (PERIANAL) 2.5 % EX CREA: 1.0000 "application " | TOPICAL_CREAM | Freq: Two times a day (BID) | CUTANEOUS | 0 refills | Status: DC

## 2020-08-10 NOTE — Progress Notes (Signed)
Cephas Darby, MD [address]  [place]  [place], [place] [address]  Main: [phone]  Fax: [phone] Pager: [phone]   Primary Care Physician: [name], MD  Primary Gastroenterologist:  Dr. Cephas Darby  Chief Complaint  Patient presents with  . Abdominal Pain    Lower abdominal pain   . altered bowel habits    Has constipation and diarrhea 3-4  bowel movements a day   . Rectal Bleeding    Off and on 2 weeks ago had bright red blood for 2 weeks happen again 2 days ago     HPI: Victoria Pierce is a 40 y.o. female with history of chronic refractory GERD, status post Nissen fundoplication, s/p slippage, underwent repeat laparoscopic paraesophageal hernia repair with Toupet fundoplication and PEG tube for gastropexy. Surgery was performed on 08/07/2019.  This was performed by Dr. Cammie Sickle at [facility].  She also has history of symptomatic hemorrhoids, IBS with diarrhea alternating with constipation.    Patient presented to [facility] ER on 2020-01-19 secondary to severe nausea associated with vomiting as well as coughing up blood approximately for 1 week, complaining of rectal bleeding that was dark red.  She reports having regular bowel habits.  Last bowel movement was 6 days ago, reports feeling very gassy and every time she has a bowel movement she expels explosive gas.  She denies abdominal bloating, lower abdominal pain.  She does have left upper quadrant discomfort which she underwent repair of the paraesophageal hernia.  Patient thinks she is actually not coughing but she feels severely nauseous followed by vomiting which she has struggled to let the gastric contents out since the repair of the paraesophageal hernia second time.  She underwent CT abdomen pelvis with contrast in the ER patient not reveal any acute intra-abdominal pathology other than mild proctocolitis.  She does have IUD in place.  Patient also reports lack of appetite and she lost about 34  pounds since October 2020.  Her hemoglobin was normal, CMP and lipase were normal.  Patient continues to smoke.  She is on Topamax 50 mg twice daily Patient is also concerned about increased spontaneous bruising  Follow-up visit 08/10/2020 Patient is concerned about ongoing abdominal bloating, gas, upper abdominal discomfort associated with postprandial diarrhea about 3-4 times daily.  She has 1 bowel movement as soon as she wakes up in the morning, followed by every time she eats.  She has explosive diarrhea.  She is gradually losing weight, lost about 3 to 4 pounds within last 6 months.  Although, she feels that her weight has stabilized.  She continues to smoke cigarettes, she does admit that she has cut down to half pack per day from more than 1 pack/day.  She is also complaining of rectal bleeding and rectal pain from her hemorrhoids.  She is currently using over-the-counter hemorrhoid creams.  She is also currently taking Amitiza 8 MCG twice daily even though she is having diarrhea, because she reports that if she avoids the medication, results in constipation   Current Outpatient Medications  Medication Sig Dispense Refill  . ADDERALL XR 30 MG 24 hr capsule Take 30 mg by mouth every morning.    . cyclobenzaprine (FLEXERIL) 10 MG tablet Take 10 mg by mouth 3 (three) times daily as needed for muscle spasms.     Marland Kitchen gabapentin (NEURONTIN) 600 MG tablet Take 600 mg by mouth 3 (three) times daily.    . hydrocortisone (ANUSOL-HC) 2.5 % rectal  cream Place 1 application rectally 2 (two) times daily. 30 g 0  . hydroxypropyl methylcellulose / hypromellose (ISOPTO TEARS / GONIOVISC) 2.5 % ophthalmic solution Place 2 drops into both eyes 2 (two) times daily as needed for dry eyes. ARTIFICAL TEARS    . ketotifen (ZADITOR) 0.025 % ophthalmic solution Place 2 drops into both eyes 2 (two) times daily.     Marland Kitchen lamoTRIgine (LAMICTAL) 150 MG tablet Take 150 mg by mouth daily.    Marland Kitchen lubiprostone (AMITIZA) 8 MCG capsule  TAKE 1 CAPSULE BY MOUTH 2 TIMES DAILY WITH A MEAL. 180 capsule 0  . Multiple Vitamin (MULTIVITAMIN WITH MINERALS) TABS tablet Take 1 tablet by mouth daily.    . naproxen (NAPROSYN) 500 MG tablet Take 500 mg by mouth 2 (two) times daily.    . ondansetron (ZOFRAN-ODT) 4 MG disintegrating tablet Take 1 tablet (4 mg total) by mouth every 6 (six) hours as needed for nausea or vomiting. 40 tablet 0  . propranolol (INDERAL) 20 MG tablet Take by mouth.    . QUEtiapine (SEROQUEL) 50 MG tablet TAKE 1 ORAL TABLETS AT BEDTIME    . topiramate (TOPAMAX) 50 MG tablet Take 50 mg by mouth 2 (two) times daily.     Current Facility-Administered Medications  Medication Dose Route Frequency Provider Last Rate Last Admin  . levonorgestrel (MIRENA) 20 MCG/24HR IUD   Intrauterine Once Homero Fellers, MD        Allergies as of 08/10/2020  . (No Known Allergies)    NSAIDs: None  Antiplts/Anticoagulants/Anti thrombotics: None  GI procedures:  EGD and flexible sigmoidoscopy 02/04/2020 - Normal duodenal bulb and second portion of the duodenum. - Erosive gastropathy with no bleeding and no stigmata of recent bleeding. Biopsied. - A 161 degree fundoplication was found. The wrap appears intact. - Normal gastroesophageal junction and esophagus.  - Diverticulosis in the sigmoid colon and in the descending colon. - Non-bleeding external hemorrhoids, source of rectal bleeding. - No specimens collected.  DIAGNOSIS:  A. STOMACH, RANDOM; COLD BIOPSY:  - GASTRIC ANTRAL AND OXYNTIC MUCOSA WITH FEATURES OF REACTIVE  GASTROPATHY.  - NEGATIVE FOR H. PYLORI, DYSPLASIA, AND MALIGNANCY.   EGD 05/14/2018 - Normal duodenal bulb and second portion of the duodenum. Biopsied. - 4 cm hiatal hernia. - Erythematous mucosa in the gastric body and antrum. Biopsied. - Non-bleeding erosive gastropathy. - Esophagogastric landmarks identified. - Normal esophagus. Biopsied.  Colonoscopy 05/14/2018 - The examined portion of  the ileum was normal. - Normal mucosa in the entire examined colon. Biopsied. - Diverticulosis in the sigmoid colon and in the descending colon. - The distal rectum and anal verge are normal on retroflexion view.  DIAGNOSIS:  A. DUODENUM; BIOPSY:  - FEW AREAS OF VILLOUS BLUNTING WITHOUT INCREASED INTRAEPITHELIAL  LYMPHOCYTES.  - SEE COMMENT.   B. STOMACH, RANDOM; BIOPSIES:  - BENIGN GASTRIC MUCOSA WITH FOCAL FIBROSIS IN LAMINA PROPRIA AND  REACTIVE/REGENERATIVE APPEARING GLANDS.  - NEGATIVE FOR ACTIVE MUCOSAL INFLAMMATION, H. PYLORI, INTESTINAL  METAPLASIA AND DYSPLASIA.  - SEE COMMENT.   C. ESOPHAGUS; BIOPSY:  - NO SIGNIFICANT PATHOLOGIC FEATURES.   D. COLON, RANDOM; BIOPSIES:  - NEGATIVE FOR ACTIVE MUCOSAL INFLAMMATION, GRANULOMAS AND  LYMPHOCYTIC/MICROSCOPIC COLITIS.  - FEATURES OF IDIOPATHIC CHRONIC INFLAMMATORY BOWEL DISEASE (ULCERATIVE  COLITIS) ARE NOT SEEN.  - NO DYSPLASIA OR MALIGNANCY IDENTIFIED.  ROS:  General: Negative for anorexia, weight loss, fever, chills, fatigue, weakness. ENT: Negative for hoarseness, difficulty swallowing , nasal congestion. CV: Negative for chest pain, angina, palpitations, dyspnea  on exertion, peripheral edema.  Respiratory: Negative for dyspnea at rest, dyspnea on exertion, cough, sputum, wheezing.  GI: See history of present illness. GU:  Negative for dysuria, hematuria, urinary incontinence, urinary frequency, nocturnal urination.  Endo: Negative for unusual weight change.    Physical Examination:   BP 125/86 (BP Location: Left Arm, Patient Position: Sitting, Cuff Size: Normal)   Pulse 98   Temp 98 F (36.7 C) (Oral)   Ht 5' 3"  (1.6 m)   Wt 151 lb 2 oz (68.5 kg)   BMI 26.77 kg/m   General: Well-nourished, well-developed in no acute distress.  Eyes: No icterus. Conjunctivae pink. Mouth: Oropharyngeal mucosa moist and pink , no lesions erythema or exudate. Lungs: Clear to auscultation bilaterally. Non-labored. Heart:  Regular rate and rhythm, no murmurs rubs or gallops.  Abdomen: Bowel sounds are normal, nontender, nondistended, no hepatosplenomegaly or masses, no hernia , no rebound or guarding.   Rectal exam: Not performed Extremities: No lower extremity edema. No clubbing or deformities. Neuro: Alert and oriented x 3.  Grossly intact. Skin: Warm and dry, no jaundice, multiple areas of bruises in upper and lower extremities.   Psych: Alert and cooperative, normal mood and affect.   Imaging Studies: Reviewed  Assessment and Plan:   VERENICE WESTRICH is a 40 y.o. 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  Celiac serologies negative, normal IgA levels.  Random colon biopsies were unremarkable, normal TI. No evidence of H. Pylori Will empirically try pancreatic enzymes, samples provided If symptoms are persistent, will treat for possible bacterial overgrowth Highly encouraged to continue to work on quitting smoking Advised patient to message me via MyChart  Symptomatic external hemorrhoids, flareup secondary to diarrhea Recommend Anusol cream twice daily If symptoms are persistent, will perform hemorrhoid ligation   Follow up in 3 months  Dr Sherri Sear, MD

## 2020-08-10 NOTE — Patient Instructions (Signed)
Take Zenpep 1 capsule with the first bite of each meal and 1 capsule with the first bite of each snack

## 2020-08-19 ENCOUNTER — Ambulatory Visit (INDEPENDENT_AMBULATORY_CARE_PROVIDER_SITE_OTHER): Payer: Medicaid Other | Admitting: Pulmonary Disease

## 2020-08-19 ENCOUNTER — Encounter: Payer: Self-pay | Admitting: Pulmonary Disease

## 2020-08-19 ENCOUNTER — Other Ambulatory Visit: Payer: Self-pay

## 2020-08-19 ENCOUNTER — Telehealth: Payer: Self-pay | Admitting: Pulmonary Disease

## 2020-08-19 VITALS — BP 124/78 | HR 79 | Temp 97.6°F | Ht 63.0 in | Wt 153.0 lb

## 2020-08-19 DIAGNOSIS — J4550 Severe persistent asthma, uncomplicated: Secondary | ICD-10-CM

## 2020-08-19 DIAGNOSIS — R0609 Other forms of dyspnea: Secondary | ICD-10-CM

## 2020-08-19 DIAGNOSIS — F17210 Nicotine dependence, cigarettes, uncomplicated: Secondary | ICD-10-CM

## 2020-08-19 DIAGNOSIS — U099 Post covid-19 condition, unspecified: Secondary | ICD-10-CM

## 2020-08-19 MED ORDER — PREDNISONE 10 MG PO TABS: ORAL_TABLET | ORAL | 0 refills | Status: AC

## 2020-08-19 MED ORDER — BUDESONIDE-FORMOTEROL FUMARATE 160-4.5 MCG/ACT IN AERO: 2.0000 | INHALATION_SPRAY | Freq: Two times a day (BID) | RESPIRATORY_TRACT | 6 refills | Status: DC

## 2020-08-19 MED ORDER — ALBUTEROL SULFATE HFA 108 (90 BASE) MCG/ACT IN AERS: 2.0000 | INHALATION_SPRAY | Freq: Four times a day (QID) | RESPIRATORY_TRACT | 5 refills | Status: DC | PRN

## 2020-08-19 NOTE — Patient Instructions (Signed)
Prednisone 10 mg pill >> 3 pills daily for 2 days, 2 pills daily for 2 days, 1 pill daily for 2 days  Symbicort two puffs twice per day, and rinse your mouth after each use  Albuterol two puffs every 6 hours as needed for cough, wheeze, sputum, or chest congestion  Will arrange for pulmonary function test   Follow up in 6 weeks with Dr. Halford Chessman or Dr. Patsey Berthold

## 2020-08-19 NOTE — Progress Notes (Signed)
Pulmonary, Critical Care, and Sleep Medicine  Chief Complaint  Patient presents with  . Follow-up    Per Andres Ege, CNM-c/o sob with exertion, prod cough with dark colored sputum and wheezing. Hx of covid 04/2020    Constitutional:  BP 124/78 (BP Location: Left Arm, Cuff Size: Normal)   Pulse 79   Temp 97.6 F (36.4 C) (Temporal)   Ht 5' 3"  (1.6 m)   Wt 153 lb (69.4 kg)   SpO2 99%   BMI 27.10 kg/m   Past Medical History:  Anxiety, Bipolar, Depression, GERD s/p Nissen fundoplication with repair of hiatal hernia, Ulcerative colitis  Past Surgical History:  She  has a past surgical history that includes Tubal ligation; Closed reduction metacarpal with percutaneous pinning (Left, 03/07/2018); Hardware Removal (Left, 04/03/2018); Esophagogastroduodenoscopy (egd) with propofol (N/A, 05/14/2018); Colonoscopy with propofol (N/A, 05/14/2018); Trigger finger release (Left, 06/19/2018); Robotic assisted laparoscopic nissen fundoplication (N/A, 1/54/0086); Esophagogastroduodenoscopy (egd) with propofol (N/A, 03/06/2019); Fracture surgery; Hernia repair; Esophagogastroduodenoscopy (egd) with propofol (N/A, 02/04/2020); and Flexible sigmoidoscopy (N/A, 02/04/2020).  Brief Summary:  Victoria Pierce is a 40 y.o. female smoker with asthma and worsening cough with dyspnea after having COVID 19 infection in January 2022.      Subjective:   She was seen by Dr. Patsey Berthold in 2021 for asthma.  She was tried on inhaler therapy then, but didn't feel like this helped.  She was diagnosed with COVID 19 infection in January 2022.  Didn't receive any specific COVID therapy at the time, but then had more trouble with cough and wheeze.  Was also feeling more short of breath with activity.  Was treated with steroids and antibiotics.  Felt some improvement, but not complete resolution.  She gets allergy symptoms.  Has been using montelukast.  She is unable to tolerate nose sprays.  Tried OTC  antihistamine pills, but weren't effective.  She saw cardiology recently.  Cardiac CT showed stable lung nodules, but otherwise unrevealing.  She had Echo that showed mitral and aortic regurgitation.  She missed her follow up cardiology appointment.  She still smokes cigarettes, but has decreased to 1/2 pack per day since she had COVID.  She has lost about 30 lbs recently, and is being assessed by GI.  Physical Exam:   Appearance - well kempt   ENMT - no sinus tenderness, no oral exudate, no LAN, Mallampati 2 airway, no stridor  Respiratory - faint bilateral expiratory wheezing and squeaks  CV - s1s2 regular rate and rhythm, no murmurs  Ext - no clubbing, no edema  Skin - no rashes  Psych - normal mood and affect   Pulmonary testing:    Chest Imaging:   Coronary CT 07/08/20 >> 4 mm nodule RLL no change and 4 mm nodule minor fissure no change since 06/10/18, no CAD  Cardiac Tests:   Echo 07/13/20 >> EF 55 to 60%, mild MR, mild/mod AR  Social History:  She  reports that she has been smoking cigarettes. She has a 40.50 pack-year smoking history. She has never used smokeless tobacco. She reports current drug use. Drug: Marijuana. She reports that she does not drink alcohol.  Family History:  Her family history includes Hyperlipidemia in her father and mother; Hypertension in her father, mother, and sister.    Discussion:  She has progressive cough, dyspnea and wheezing since being diagnosed with COVID 19 infection in January 2022.  She has prior history of asthma and allergies, and she continues to smoke cigarettes.  She was found to have mitral and aortic regurgitation.  Assessment/Plan:   Asthma with acute exacerbation. - will give her course of prednisone - will have her try symbicort - albuterol prn - will arrange for pulmonary function test  Tobacco abuse. - reviewed options to assist with smoking cessation - she will continue try quitting on her own  Mitral and  aortic regurgitation. - advised her to reschedule follow up appointment with Dr. Kate Sable with [facility]  Weight loss with concern for pancreatic insufficiency. - followed by Dr. Lin Landsman with [facility]  Time Spent Involved in Patient Care on Day of Examination:  35 minutes  Follow up:  Patient Instructions  Prednisone 10 mg pill >> 3 pills daily for 2 days, 2 pills daily for 2 days, 1 pill daily for 2 days  Symbicort two puffs twice per day, and rinse your mouth after each use  Albuterol two puffs every 6 hours as needed for cough, wheeze, sputum, or chest congestion  Will arrange for pulmonary function test   Follow up in 6 weeks with Dr. Halford Chessman or Dr. Patsey Berthold   Medication List:   Allergies as of 08/19/2020   No Known Allergies     Medication List       Accurate as of Aug 19, 2020 11:46 AM. If you have any questions, ask your nurse or doctor.        Adderall XR 30 MG 24 hr capsule Generic drug: amphetamine-dextroamphetamine Take 30 mg by mouth every morning.   albuterol 108 (90 Base) MCG/ACT inhaler Commonly known as: VENTOLIN HFA Inhale 2 puffs into the lungs every 6 (six) hours as needed for wheezing or shortness of breath. Started by: Chesley Mires, MD   budesonide-formoterol 160-4.5 MCG/ACT inhaler Commonly known as: Symbicort Inhale 2 puffs into the lungs in the morning and at bedtime. Started by: Chesley Mires, MD   cyclobenzaprine 10 MG tablet Commonly known as: FLEXERIL Take 10 mg by mouth 3 (three) times daily as needed for muscle spasms.   gabapentin 600 MG tablet Commonly known as: NEURONTIN Take 600 mg by mouth 3 (three) times daily.   hydrocortisone 2.5 % rectal cream Commonly known as: ANUSOL-HC Place 1 application rectally 2 (two) times daily.   hydroxypropyl methylcellulose / hypromellose 2.5 % ophthalmic solution Commonly known as: ISOPTO TEARS / GONIOVISC Place 2 drops into both eyes 2 (two) times  daily as needed for dry eyes. ARTIFICAL TEARS   ketotifen 0.025 % ophthalmic solution Commonly known as: ZADITOR Place 2 drops into both eyes 2 (two) times daily.   lamoTRIgine 150 MG tablet Commonly known as: LAMICTAL Take 150 mg by mouth daily.   lubiprostone 8 MCG capsule Commonly known as: Amitiza TAKE 1 CAPSULE BY MOUTH 2 TIMES DAILY WITH A MEAL.   montelukast 10 MG tablet Commonly known as: SINGULAIR Take 10 mg by mouth at bedtime.   multivitamin with minerals Tabs tablet Take 1 tablet by mouth daily.   naproxen 500 MG tablet Commonly known as: NAPROSYN Take 500 mg by mouth 2 (two) times daily.   ondansetron 4 MG disintegrating tablet Commonly known as: ZOFRAN-ODT Take 1 tablet (4 mg total) by mouth every 6 (six) hours as needed for nausea or vomiting.   predniSONE 10 MG tablet Commonly known as: DELTASONE Take 3 tablets (30 mg total) by mouth daily with breakfast for 2 days, THEN 2 tablets (20 mg total) daily with breakfast for 2 days, THEN 1 tablet (10 mg  total) daily with breakfast for 2 days. Start taking on: Aug 19, 2020 Started by: Chesley Mires, MD   propranolol 20 MG tablet Commonly known as: INDERAL Take by mouth.   QUEtiapine 50 MG tablet Commonly known as: SEROQUEL TAKE 1 ORAL TABLETS AT BEDTIME   topiramate 50 MG tablet Commonly known as: TOPAMAX Take 50 mg by mouth 2 (two) times daily.       Signature:  Chesley Mires, MD [place] Pager - [phone] 08/19/2020, 11:46 AM

## 2020-08-19 NOTE — Telephone Encounter (Addendum)
Received PA request for symbicort 160. Preferred alternatives are Avair Diskus, Advair HFA, Anoro, Combivent, Dulera and Flovent  Dr. Halford Chessman, please advise. Thanks

## 2020-08-20 NOTE — Telephone Encounter (Signed)
Lm for patient.  

## 2020-08-20 NOTE — Telephone Encounter (Signed)
Please send script for dulera 100 two puffs bid.

## 2020-08-23 NOTE — Telephone Encounter (Signed)
Lm x2 for patient.  Will close encounter per office protocol.   

## 2020-08-30 ENCOUNTER — Ambulatory Visit: Payer: Medicaid Other | Admitting: Cardiology

## 2020-09-14 ENCOUNTER — Other Ambulatory Visit: Payer: Self-pay | Admitting: Physical Medicine & Rehabilitation

## 2020-09-14 DIAGNOSIS — M542 Cervicalgia: Secondary | ICD-10-CM

## 2020-09-14 DIAGNOSIS — G8929 Other chronic pain: Secondary | ICD-10-CM

## 2020-09-14 DIAGNOSIS — M546 Pain in thoracic spine: Secondary | ICD-10-CM

## 2020-09-14 DIAGNOSIS — M5414 Radiculopathy, thoracic region: Secondary | ICD-10-CM

## 2020-09-29 ENCOUNTER — Ambulatory Visit: Admission: RE | Admit: 2020-09-29 | Payer: Medicaid Other | Source: Ambulatory Visit

## 2020-10-26 ENCOUNTER — Other Ambulatory Visit: Payer: Self-pay

## 2020-10-26 ENCOUNTER — Encounter (HOSPITAL_BASED_OUTPATIENT_CLINIC_OR_DEPARTMENT_OTHER): Payer: Self-pay

## 2020-10-26 ENCOUNTER — Observation Stay (HOSPITAL_BASED_OUTPATIENT_CLINIC_OR_DEPARTMENT_OTHER)
Admission: EM | Admit: 2020-10-26 | Discharge: 2020-10-27 | DRG: 378 | Discharge status: Against Medical Advice | Payer: Medicaid Other | Attending: Internal Medicine | Admitting: Internal Medicine

## 2020-10-26 ENCOUNTER — Ambulatory Visit: Payer: Medicaid Other

## 2020-10-26 ENCOUNTER — Ambulatory Visit: Payer: Self-pay | Admitting: *Deleted

## 2020-10-26 ENCOUNTER — Emergency Department (HOSPITAL_BASED_OUTPATIENT_CLINIC_OR_DEPARTMENT_OTHER): Payer: Medicaid Other

## 2020-10-26 DIAGNOSIS — K5790 Diverticulosis of intestine, part unspecified, without perforation or abscess without bleeding: Secondary | ICD-10-CM

## 2020-10-26 DIAGNOSIS — Z5329 Procedure and treatment not carried out because of patient's decision for other reasons: Secondary | ICD-10-CM | POA: Diagnosis not present

## 2020-10-26 DIAGNOSIS — F319 Bipolar disorder, unspecified: Secondary | ICD-10-CM | POA: Diagnosis present

## 2020-10-26 DIAGNOSIS — Z8719 Personal history of other diseases of the digestive system: Secondary | ICD-10-CM

## 2020-10-26 DIAGNOSIS — K219 Gastro-esophageal reflux disease without esophagitis: Secondary | ICD-10-CM | POA: Diagnosis not present

## 2020-10-26 DIAGNOSIS — Z8349 Family history of other endocrine, nutritional and metabolic diseases: Secondary | ICD-10-CM | POA: Diagnosis not present

## 2020-10-26 DIAGNOSIS — R55 Syncope and collapse: Secondary | ICD-10-CM | POA: Diagnosis present

## 2020-10-26 DIAGNOSIS — D649 Anemia, unspecified: Secondary | ICD-10-CM

## 2020-10-26 DIAGNOSIS — Z8249 Family history of ischemic heart disease and other diseases of the circulatory system: Secondary | ICD-10-CM | POA: Diagnosis not present

## 2020-10-26 DIAGNOSIS — F419 Anxiety disorder, unspecified: Secondary | ICD-10-CM | POA: Diagnosis present

## 2020-10-26 DIAGNOSIS — Z791 Long term (current) use of non-steroidal anti-inflammatories (NSAID): Secondary | ICD-10-CM

## 2020-10-26 DIAGNOSIS — F17210 Nicotine dependence, cigarettes, uncomplicated: Secondary | ICD-10-CM | POA: Diagnosis present

## 2020-10-26 DIAGNOSIS — D62 Acute posthemorrhagic anemia: Secondary | ICD-10-CM | POA: Diagnosis present

## 2020-10-26 DIAGNOSIS — K922 Gastrointestinal hemorrhage, unspecified: Secondary | ICD-10-CM

## 2020-10-26 DIAGNOSIS — K5731 Diverticulosis of large intestine without perforation or abscess with bleeding: Secondary | ICD-10-CM | POA: Diagnosis present

## 2020-10-26 DIAGNOSIS — J45909 Unspecified asthma, uncomplicated: Secondary | ICD-10-CM | POA: Diagnosis present

## 2020-10-26 DIAGNOSIS — Z30430 Encounter for insertion of intrauterine contraceptive device: Secondary | ICD-10-CM

## 2020-10-26 DIAGNOSIS — Z7951 Long term (current) use of inhaled steroids: Secondary | ICD-10-CM

## 2020-10-26 DIAGNOSIS — Z20822 Contact with and (suspected) exposure to covid-19: Secondary | ICD-10-CM | POA: Diagnosis present

## 2020-10-26 DIAGNOSIS — Z79899 Other long term (current) drug therapy: Secondary | ICD-10-CM | POA: Diagnosis not present

## 2020-10-26 HISTORY — DX: Anemia, unspecified: D64.9

## 2020-10-26 LAB — COMPREHENSIVE METABOLIC PANEL
ALT: 9 U/L (ref 0–44)
AST: 9 U/L — ABNORMAL LOW (ref 15–41)
Albumin: 3.9 g/dL (ref 3.5–5.0)
Alkaline Phosphatase: 48 U/L (ref 38–126)
Anion gap: 7 (ref 5–15)
BUN: 14 mg/dL (ref 6–20)
CO2: 21 mmol/L — ABNORMAL LOW (ref 22–32)
Calcium: 8.5 mg/dL — ABNORMAL LOW (ref 8.9–10.3)
Chloride: 111 mmol/L (ref 98–111)
Creatinine, Ser: 0.8 mg/dL (ref 0.44–1.00)
GFR, Estimated: >60 mL/min (ref >60)
Glucose, Bld: 98 mg/dL (ref 70–99)
Potassium: 3.7 mmol/L (ref 3.5–5.1)
Sodium: 139 mmol/L (ref 135–145)
Total Bilirubin: 0.3 mg/dL (ref 0.3–1.2)
Total Protein: 5.8 g/dL — ABNORMAL LOW (ref 6.5–8.1)

## 2020-10-26 LAB — CBC WITH DIFFERENTIAL/PLATELET
Abs Immature Granulocytes: 0.04 10*3/uL (ref 0.00–0.07)
Basophils Absolute: 0.1 10*3/uL (ref 0.0–0.1)
Basophils Relative: 1 %
Eosinophils Absolute: 0.2 10*3/uL (ref 0.0–0.5)
Eosinophils Relative: 2 %
HCT: 20 % — ABNORMAL LOW (ref 36.0–46.0)
Hemoglobin: 6.4 g/dL — CL (ref 12.0–15.0)
Immature Granulocytes: 0 %
Lymphocytes Relative: 18 %
Lymphs Abs: 2.2 10*3/uL (ref 0.7–4.0)
MCH: 28.4 pg (ref 26.0–34.0)
MCHC: 32 g/dL (ref 30.0–36.0)
MCV: 88.9 fL (ref 80.0–100.0)
Monocytes Absolute: 1 10*3/uL (ref 0.1–1.0)
Monocytes Relative: 8 %
Neutro Abs: 8.9 10*3/uL — ABNORMAL HIGH (ref 1.7–7.7)
Neutrophils Relative %: 71 %
Platelets: 320 10*3/uL (ref 150–400)
RBC: 2.25 MIL/uL — ABNORMAL LOW (ref 3.87–5.11)
RDW: 13.3 % (ref 11.5–15.5)
WBC: 12.4 10*3/uL — ABNORMAL HIGH (ref 4.0–10.5)
nRBC: 0 % (ref 0.0–0.2)

## 2020-10-26 LAB — URINALYSIS, ROUTINE W REFLEX MICROSCOPIC
Bilirubin Urine: NEGATIVE
Glucose, UA: NEGATIVE mg/dL
Hgb urine dipstick: NEGATIVE
Ketones, ur: NEGATIVE mg/dL
Leukocytes,Ua: NEGATIVE
Nitrite: NEGATIVE
Specific Gravity, Urine: 1.01 (ref 1.005–1.030)
pH: 7 (ref 5.0–8.0)

## 2020-10-26 LAB — RESP PANEL BY RT-PCR (FLU A&B, COVID) ARPGX2
Influenza A by PCR: NEGATIVE
Influenza B by PCR: NEGATIVE
SARS Coronavirus 2 by RT PCR: NEGATIVE

## 2020-10-26 LAB — LIPASE, BLOOD: Lipase: <10 U/L — ABNORMAL LOW (ref 11–51)

## 2020-10-26 LAB — PREGNANCY, URINE: Preg Test, Ur: NEGATIVE

## 2020-10-26 MED ORDER — LACTATED RINGERS IV BOLUS
1000.0000 mL | Freq: Once | INTRAVENOUS | Status: AC
  Administered 2020-10-26: 1000 mL via INTRAVENOUS

## 2020-10-26 MED ORDER — IOHEXOL 300 MG/ML  SOLN
100.0000 mL | Freq: Once | INTRAMUSCULAR | Status: AC | PRN
  Administered 2020-10-26: 100 mL via INTRAVENOUS

## 2020-10-26 MED ORDER — LACTATED RINGERS IV SOLN: INTRAVENOUS | Status: DC

## 2020-10-26 MED ORDER — SODIUM CHLORIDE 0.9 % IV SOLN: INTRAVENOUS | Status: DC

## 2020-10-26 NOTE — Progress Notes (Signed)
Ms. Victoria Pierce is a 40 year old woman with medical history significant for anxiety, asthma, ulcerative colitis, diverticulitis, who presented to the [place] ED with over 72-monthhistory of rectal bleeding and syncopal episode that occurred a day prior to going to the emergency room.  She was found to have severe anemia with hemoglobin of 6.4.  Case was discussed with Dr. ALacretia Leigh, ED physician, who referred the patient to the hospitalist service for admission to the hospital.  He said he had notified Dr. BCristina Gongwith GI.

## 2020-10-26 NOTE — Telephone Encounter (Signed)
Reason for Disposition  [1] MODERATE rectal bleeding (small blood clots, passing blood without stool, or toilet water turns red) AND [2] more than once a day  Answer Assessment - Initial Assessment Questions 1. APPEARANCE of BLOOD: "What color is it?" "Is it passed separately, on the surface of the stool, or mixed in with the stool?"      Slimy discharge- mixed in- loose stool 2. AMOUNT: "How much blood was passed?"      Large amount 3. FREQUENCY: "How many times has blood been passed with the stools?"      Every time 4. ONSET: "When was the blood first seen in the stools?" (Days or weeks)      2 months 5. DIARRHEA: "Is there also some diarrhea?" If Yes, ask: "How many diarrhea stools in the past 24 hours?"      Diarrhea, bloating 6. CONSTIPATION: "Do you have constipation?" If Yes, ask: "How bad is it?"     no 7. RECURRENT SYMPTOMS: "Have you had blood in your stools before?" If Yes, ask: "When was the last time?" and "What happened that time?"      no 8. BLOOD THINNERS: "Do you take any blood thinners?" (e.g., Coumadin/warfarin, Pradaxa/dabigatran, aspirin)     no 9. OTHER SYMPTOMS: "Do you have any other symptoms?"  (e.g., abdomen pain, vomiting, dizziness, fever)     Abdominal bloating, dizziness 10. PREGNANCY: "Is there any chance you are pregnant?" "When was your last menstrual period?"       No- BTL/IUD  Protocols used: Rectal Bleeding-A-AH

## 2020-10-26 NOTE — ED Provider Notes (Signed)
[place] EMERGENCY DEPT Provider Note   CSN: [id number] Arrival date & time: 10/26/20  1436     History Chief Complaint  Patient presents with   Rectal Bleeding    Bright red blood from rectum x 2 or more months    Victoria Pierce is a 40 y.o. female.  40 year old female presents with rectal bleeding x4 months.  States that she notices bright red blood on her tissue when she wipes.  Does have a history of ulcerative colitis along with diverticulitis.  Has had some abdominal cramping but denies any fever.  No emesis noted.  States that yesterday when she went to stand up she got weak and passed out.  No reported seizure activity.  Denies any blood thinner use      Past Medical History:  Diagnosis Date   Anxiety    Asthma    Bipolar 1 disorder ([place])    Depression    Diverticulitis    GERD (gastroesophageal reflux disease)    Hernia, abdominal    Hiatal hernia    Ulcerative colitis    Vaginal septum     Patient Active Problem List   Diagnosis Date Noted   Shortness of breath 06/18/2020   Chest pain of uncertain etiology 33/38/3291   Left breast lump 06/18/2020   Non-intractable vomiting    Rectal bleeding    Tobacco use disorder 08/11/2019   S/P repair of paraesophageal hernia 08/07/2019   History of Nissen fundoplication 91/66/0600   Chronic pelvic pain in female 06/14/2018   Dysmenorrhea 06/14/2018   Menorrhagia with regular cycle 06/14/2018   Contracture of finger joint, 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   Closed displaced fracture of neck of fifth metacarpal bone of left hand 03/07/2018   Chronic GERD    Anxiety     Past Surgical History:  Procedure Laterality Date   CLOSED REDUCTION METACARPAL WITH PERCUTANEOUS PINNING Left 03/07/2018   Procedure: CLOSED REDUCTION METACARPAL WITH PERCUTANEOUS PINNING-5th metacarpal;  Surgeon: Corky Mull, MD;  Location: [facility] ORS;  Service: Orthopedics;  Laterality: Left;   COLONOSCOPY WITH PROPOFOL N/A 05/14/2018    Procedure: COLONOSCOPY WITH PROPOFOL;  Surgeon: Lin Landsman, MD;  Location: [facility] ENDOSCOPY;  Service: Gastroenterology;  Laterality: N/A;   ESOPHAGOGASTRODUODENOSCOPY (EGD) WITH PROPOFOL N/A 05/14/2018   Procedure: ESOPHAGOGASTRODUODENOSCOPY (EGD) WITH PROPOFOL;  Surgeon: Lin Landsman, MD;  Location: [facility] ENDOSCOPY;  Service: Gastroenterology;  Laterality: N/A;   ESOPHAGOGASTRODUODENOSCOPY (EGD) WITH PROPOFOL N/A 03/06/2019   Procedure: ESOPHAGOGASTRODUODENOSCOPY (EGD) WITH PROPOFOL;  Surgeon: Lin Landsman, MD;  Location: [facility] ENDOSCOPY;  Service: Gastroenterology;  Laterality: N/A;   ESOPHAGOGASTRODUODENOSCOPY (EGD) WITH PROPOFOL N/A 02/04/2020   Procedure: ESOPHAGOGASTRODUODENOSCOPY (EGD) WITH PROPOFOL;  Surgeon: Lin Landsman, MD;  Location: [facility] ENDOSCOPY;  Service: Gastroenterology;  Laterality: N/A;   FLEXIBLE SIGMOIDOSCOPY N/A 02/04/2020   Procedure: FLEXIBLE SIGMOIDOSCOPY;  Surgeon: Lin Landsman, MD;  Location: [facility] ENDOSCOPY;  Service: Gastroenterology;  Laterality: N/A;   FRACTURE SURGERY     HARDWARE REMOVAL Left 04/03/2018   Procedure: LEFT HAND FIFTH METACARPAL PIN REMOVAL;  Surgeon: Corky Mull, MD;  Location: [place];  Service: Orthopedics;  Laterality: Left;   HERNIA REPAIR     ROBOTIC ASSISTED LAPAROSCOPIC NISSEN FUNDOPLICATION N/A 4/59/9774   Procedure: ROBOTIC ASSISTED LAPAROSCOPIC NISSEN FUNDOPLICATION;  Surgeon: Jules Husbands, MD;  Location: [facility] ORS;  Service: General;  Laterality: N/A;   TRIGGER FINGER RELEASE Left 06/19/2018   Procedure: EXTENSOR TENOLYSIS WITH CAPSULAR RELEASE OF LEFT LITTLE MCP JOINT;  Surgeon: Corky Mull, MD;  Location: [place];  Service: Orthopedics;  Laterality: Left;   TUBAL LIGATION       OB History     Gravida  2   Para  2   Term  2   Preterm      AB      Living  2      SAB      IAB      Ectopic      Multiple      Live Births  2           Family History   Problem Relation Age of Onset   Hypertension Sister    Hyperlipidemia Mother    Hypertension Mother    Hypertension Father    Hyperlipidemia Father     Social History   Tobacco Use   Smoking status: Every Day    Packs/day: 1.50    Years: 27.00    Pack years: 40.50    Types: Cigarettes   Smokeless tobacco: Never   Tobacco comments:    0.5ppd 08/19/2020  Vaping Use   Vaping Use: Never used  Substance Use Topics   Alcohol use: No   Drug use: Yes    Types: Marijuana    Comment: used Monday    Home Medications Prior to Admission medications   Medication Sig Start Date End Date Taking? Authorizing Provider  ADDERALL XR 30 MG 24 hr capsule Take 30 mg by mouth every morning. 05/25/20   [provider]  albuterol (VENTOLIN HFA) 108 (90 Base) MCG/ACT inhaler Inhale 2 puffs into the lungs every 6 (six) hours as needed for wheezing or shortness of breath. 08/19/20   Chesley Mires, MD  budesonide-formoterol [facility]) 160-4.5 MCG/ACT inhaler Inhale 2 puffs into the lungs in the morning and at bedtime. 08/19/20   Chesley Mires, MD  cyclobenzaprine (FLEXERIL) 10 MG tablet Take 10 mg by mouth 3 (three) times daily as needed for muscle spasms.     [provider]  gabapentin (NEURONTIN) 600 MG tablet Take 600 mg by mouth 3 (three) times daily. 06/04/20   [provider]  hydrocortisone (ANUSOL-HC) 2.5 % rectal cream Place 1 application rectally 2 (two) times daily. 08/10/20   Lin Landsman, MD  hydroxypropyl methylcellulose / hypromellose (ISOPTO TEARS / GONIOVISC) 2.5 % ophthalmic solution Place 2 drops into both eyes 2 (two) times daily as needed for dry eyes. ARTIFICAL TEARS    [provider]  ketotifen (ZADITOR) 0.025 % ophthalmic solution Place 2 drops into both eyes 2 (two) times daily.     [provider]  lamoTRIgine (LAMICTAL) 150 MG tablet Take 150 mg by mouth daily. 03/09/20   [provider]  lubiprostone (AMITIZA) 8 MCG capsule  TAKE 1 CAPSULE BY MOUTH 2 TIMES DAILY WITH A MEAL. 06/07/20   Vanga, Tally Due, MD  montelukast (SINGULAIR) 10 MG tablet Take 10 mg by mouth at bedtime.    [provider]  Multiple Vitamin (MULTIVITAMIN WITH MINERALS) TABS tablet Take 1 tablet by mouth daily.    [provider]  naproxen (NAPROSYN) 500 MG tablet Take 500 mg by mouth 2 (two) times daily. 02/14/20   [provider]  ondansetron (ZOFRAN-ODT) 4 MG disintegrating tablet Take 1 tablet (4 mg total) by mouth every 6 (six) hours as needed for nausea or vomiting. 09/27/18   Tylene Fantasia, PA-C  propranolol (INDERAL) 20 MG tablet Take by mouth. 06/16/19   [provider]  QUEtiapine (SEROQUEL) 50 MG tablet TAKE 1 ORAL TABLETS AT BEDTIME 05/25/20   [provider]  topiramate (TOPAMAX) 50 MG tablet Take 50 mg by mouth 2 (two) times daily.    [provider]    Allergies    Patient has no known allergies.  Review of Systems   Review of Systems  All other systems reviewed and are negative.  Physical Exam Updated Vital Signs BP 109/71 (BP Location: Right Arm)   Pulse 91   Temp 98.5 F (36.9 C) (Oral)   Resp 18   Ht 1.6 m (5' 3" )   Wt 68 kg   SpO2 99%   BMI 26.57 kg/m   Physical Exam Vitals and nursing note reviewed.  Constitutional:      General: She is not in acute distress.    Appearance: Normal appearance. She is well-developed. She is not toxic-appearing.  HENT:     Head: Normocephalic and atraumatic.  Eyes:     General: Lids are normal.     Conjunctiva/sclera: Conjunctivae normal.     Pupils: Pupils are equal, round, and reactive to light.  Neck:     Thyroid: No thyroid mass.     Trachea: No tracheal deviation.  Cardiovascular:     Rate and Rhythm: Normal rate and regular rhythm.     Heart sounds: Normal heart sounds. No murmur heard.   No gallop.  Pulmonary:     Effort: Pulmonary effort is normal. No respiratory distress.     Breath sounds: Normal breath  sounds. No stridor. No decreased breath sounds, wheezing, rhonchi or rales.  Abdominal:     General: There is no distension.     Palpations: Abdomen is soft.     Tenderness: There is no abdominal tenderness. There is no rebound.  Genitourinary:    Comments: Gross blood noted on digital rectal exam Musculoskeletal:        General: No tenderness. Normal range of motion.     Cervical back: Normal range of motion and neck supple.  Skin:    General: Skin is warm and dry.     Findings: No abrasion or rash.  Neurological:     Mental Status: She is alert and oriented to person, place, and time. Mental status is at baseline.     GCS: GCS eye subscore is 4. GCS verbal subscore is 5. GCS motor subscore is 6.     Cranial Nerves: Cranial nerves are intact. No cranial nerve deficit.     Sensory: No sensory deficit.     Motor: Motor function is intact.  Psychiatric:        Attention and Perception: Attention normal.        Speech: Speech normal.        Behavior: Behavior normal.    ED Results / Procedures / Treatments   Labs (all labs ordered are listed, but only abnormal results are displayed) Labs Reviewed  CBC WITH DIFFERENTIAL/PLATELET  COMPREHENSIVE METABOLIC PANEL  LIPASE, BLOOD  URINALYSIS, ROUTINE W REFLEX MICROSCOPIC  PREGNANCY, URINE    EKG EKG Interpretation  Date/Time:  Tuesday October 26 2020 15:08:06 EDT Ventricular Rate:  92 PR Interval:  120 QRS Duration: 93 QT Interval:  345 QTC Calculation: 427 R Axis:   66 Text Interpretation: Sinus rhythm Confirmed by Lacretia Leigh ([id number]) on 10/26/2020 3:27:54 PM  Radiology No results found.  Procedures Procedures   Medications Ordered in ED Medications  0.9 %  sodium chloride infusion (has no administration in  time range)    ED Course  I have reviewed the triage vital signs and the nursing notes.  Pertinent labs & imaging results that were available during my care of the patient were reviewed by me and considered in  my medical decision making (see chart for details).    MDM Rules/Calculators/A&P                          Patient hemoglobin is 6.4 CT of abdomen shows diverticulosis.  Secure chat message sent to Dr. Cristina Gong for GI.  Will admit to the hospitalist service.  Blood transfusion ordered here as this facility does not have blood Final Clinical Impression(s) / ED Diagnoses Final diagnoses:  None    Rx / DC Orders ED Discharge Orders     None        Lacretia Leigh, MD 10/26/20 1705

## 2020-10-26 NOTE — Progress Notes (Signed)
I am on call for [facility] tonight; we are covering for unassigned patients who get admitted to [facility] and have been notified about this patient.  If she gets admitted to [facility], we will plan to see her in the morning (or sooner, if needed, upon your request).  If she ends up getting admitted to [facility], please contact the GI consultants covering unassigned patients for that hospital.  Preliminary chart review suggests a diverticular bleed, given documented left-sided tics on 04/2018 colonoscopy and 01/2020 flex sig, gross blood on digital exam per EDP, nl BUN, and absence of inflammatory changes on today's CT.  Her hgb is down 8 gm compared to 9 months ago.  Would recommend supportive care with transfusion and IV fluids, clear liquid diet, and if she shows evidence of brisk bleeding tonight, a CT angiogram with IR on standby.  Please call me if questions.  Cleotis Nipper, M.D. Pager [phone] If no answer or after 5 PM call [phone]

## 2020-10-26 NOTE — ED Triage Notes (Signed)
States been having rectal bleeding for about two months;  States past out x 2 over the last two weeks

## 2020-10-27 ENCOUNTER — Telehealth: Payer: Self-pay

## 2020-10-27 ENCOUNTER — Inpatient Hospital Stay (HOSPITAL_COMMUNITY): Payer: Medicaid Other

## 2020-10-27 DIAGNOSIS — D649 Anemia, unspecified: Secondary | ICD-10-CM

## 2020-10-27 DIAGNOSIS — K5790 Diverticulosis of intestine, part unspecified, without perforation or abscess without bleeding: Secondary | ICD-10-CM

## 2020-10-27 DIAGNOSIS — R55 Syncope and collapse: Secondary | ICD-10-CM | POA: Diagnosis not present

## 2020-10-27 DIAGNOSIS — K922 Gastrointestinal hemorrhage, unspecified: Secondary | ICD-10-CM

## 2020-10-27 DIAGNOSIS — J4520 Mild intermittent asthma, uncomplicated: Secondary | ICD-10-CM

## 2020-10-27 DIAGNOSIS — R109 Unspecified abdominal pain: Secondary | ICD-10-CM | POA: Insufficient documentation

## 2020-10-27 DIAGNOSIS — J45909 Unspecified asthma, uncomplicated: Secondary | ICD-10-CM

## 2020-10-27 LAB — HEMOGLOBIN AND HEMATOCRIT, BLOOD
HCT: 20.6 % — ABNORMAL LOW (ref 36.0–46.0)
HCT: 25.1 % — ABNORMAL LOW (ref 36.0–46.0)
Hemoglobin: 6.2 g/dL — CL (ref 12.0–15.0)
Hemoglobin: 8.2 g/dL — ABNORMAL LOW (ref 12.0–15.0)

## 2020-10-27 LAB — CBC
HCT: 21.8 % — ABNORMAL LOW (ref 36.0–46.0)
HCT: 24.5 % — ABNORMAL LOW (ref 36.0–46.0)
Hemoglobin: 6.8 g/dL — CL (ref 12.0–15.0)
Hemoglobin: 7.9 g/dL — ABNORMAL LOW (ref 12.0–15.0)
MCH: 28.5 pg (ref 26.0–34.0)
MCH: 29.2 pg (ref 26.0–34.0)
MCHC: 31.2 g/dL (ref 30.0–36.0)
MCHC: 32.2 g/dL (ref 30.0–36.0)
MCV: 91.2 fL (ref 80.0–100.0)
MCV: 90.4 fL (ref 80.0–100.0)
Platelets: 282 10*3/uL (ref 150–400)
Platelets: 280 10*3/uL (ref 150–400)
RBC: 2.39 MIL/uL — ABNORMAL LOW (ref 3.87–5.11)
RBC: 2.71 MIL/uL — ABNORMAL LOW (ref 3.87–5.11)
RDW: 13.4 % (ref 11.5–15.5)
RDW: 13.7 % (ref 11.5–15.5)
WBC: 8 10*3/uL (ref 4.0–10.5)
WBC: 7.1 10*3/uL (ref 4.0–10.5)
nRBC: 0 % (ref 0.0–0.2)
nRBC: 0 % (ref 0.0–0.2)

## 2020-10-27 LAB — COMPREHENSIVE METABOLIC PANEL
ALT: 12 U/L (ref 0–44)
AST: 10 U/L — ABNORMAL LOW (ref 15–41)
Albumin: 3.4 g/dL — ABNORMAL LOW (ref 3.5–5.0)
Alkaline Phosphatase: 54 U/L (ref 38–126)
Anion gap: 5 (ref 5–15)
BUN: 10 mg/dL (ref 6–20)
CO2: 21 mmol/L — ABNORMAL LOW (ref 22–32)
Calcium: 8.6 mg/dL — ABNORMAL LOW (ref 8.9–10.3)
Chloride: 111 mmol/L (ref 98–111)
Creatinine, Ser: 0.8 mg/dL (ref 0.44–1.00)
GFR, Estimated: >60 mL/min (ref >60)
Glucose, Bld: 98 mg/dL (ref 70–99)
Potassium: 3.6 mmol/L (ref 3.5–5.1)
Sodium: 137 mmol/L (ref 135–145)
Total Bilirubin: 0.6 mg/dL (ref 0.3–1.2)
Total Protein: 5.8 g/dL — ABNORMAL LOW (ref 6.5–8.1)

## 2020-10-27 LAB — PHOSPHORUS: Phosphorus: 2.7 mg/dL (ref 2.5–4.6)

## 2020-10-27 LAB — MAGNESIUM: Magnesium: 1.8 mg/dL (ref 1.7–2.4)

## 2020-10-27 LAB — PREPARE RBC (CROSSMATCH)

## 2020-10-27 LAB — PROTIME-INR
INR: 1.1 (ref 0.8–1.2)
Prothrombin Time: 14 seconds (ref 11.4–15.2)

## 2020-10-27 LAB — ABO/RH: ABO/RH(D): A NEG

## 2020-10-27 LAB — HIV ANTIBODY (ROUTINE TESTING W REFLEX): HIV Screen 4th Generation wRfx: NONREACTIVE

## 2020-10-27 LAB — APTT: aPTT: 26 seconds (ref 24–36)

## 2020-10-27 MED ORDER — GABAPENTIN 300 MG PO CAPS
600.0000 mg | ORAL_CAPSULE | Freq: Three times a day (TID) | ORAL | Status: DC
  Administered 2020-10-27: 600 mg via ORAL
  Filled 2020-10-27: qty 2

## 2020-10-27 MED ORDER — PROPRANOLOL HCL 20 MG PO TABS
20.0000 mg | ORAL_TABLET | Freq: Four times a day (QID) | ORAL | Status: DC
  Administered 2020-10-27: 20 mg via ORAL
  Filled 2020-10-27: qty 1

## 2020-10-27 MED ORDER — HYDROCORTISONE ACETATE 25 MG RE SUPP
25.0000 mg | Freq: Two times a day (BID) | RECTAL | Status: DC
  Administered 2020-10-27: 25 mg via RECTAL
  Filled 2020-10-27 (×2): qty 1

## 2020-10-27 MED ORDER — TOPIRAMATE 25 MG PO TABS: 50.0000 mg | ORAL_TABLET | Freq: Two times a day (BID) | ORAL | Status: DC

## 2020-10-27 MED ORDER — TECHNETIUM TC 99M-LABELED RED BLOOD CELLS IV KIT
20.5000 | PACK | Freq: Once | INTRAVENOUS | Status: AC
  Administered 2020-10-27: 20.5 via INTRAVENOUS

## 2020-10-27 MED ORDER — LAMOTRIGINE 100 MG PO TABS
200.0000 mg | ORAL_TABLET | Freq: Every day | ORAL | Status: DC
  Administered 2020-10-27: 200 mg via ORAL
  Filled 2020-10-27: qty 2

## 2020-10-27 MED ORDER — CYCLOBENZAPRINE HCL 10 MG PO TABS: 10.0000 mg | ORAL_TABLET | Freq: Three times a day (TID) | ORAL | Status: DC | PRN

## 2020-10-27 MED ORDER — MOMETASONE FURO-FORMOTEROL FUM 200-5 MCG/ACT IN AERO
2.0000 | INHALATION_SPRAY | Freq: Two times a day (BID) | RESPIRATORY_TRACT | Status: DC
  Filled 2020-10-27: qty 8.8

## 2020-10-27 MED ORDER — QUETIAPINE FUMARATE 50 MG PO TABS: 150.0000 mg | ORAL_TABLET | Freq: Every day | ORAL | Status: DC

## 2020-10-27 MED ORDER — ALPRAZOLAM 0.25 MG PO TABS: 0.2500 mg | ORAL_TABLET | Freq: Three times a day (TID) | ORAL | Status: DC | PRN

## 2020-10-27 MED ORDER — SODIUM CHLORIDE 0.9% IV SOLUTION: Freq: Once | INTRAVENOUS | Status: DC

## 2020-10-27 NOTE — Progress Notes (Addendum)
Pt decided to leave AMA. Pt educated, MD and charge nurse made aware. IV removed intact. Jerene Pitch

## 2020-10-27 NOTE — Consult Note (Signed)
Referring Provider: [facility] Primary Care Physician:  [name], MD Primary Gastroenterologist:  Althia Forts (Dr. Marius Ditch, [facility])  Reason for Consultation:  Anemia, rectal bleeding  HPI: Victoria Pierce is a 40 y.o. female with past medical history of Nissen fundoplication, rectal bleeding due to external hemorrhoids, diverticulosis and asthma presenting for consultation of anemia and rectal bleeding.  Patient reports intermittent bright red blood per rectum over the last several months, though states that it is worsened over the last few weeks.  She states she typically passes blood with every bowel movement and even passes blood per rectum when urinating.  She states her stools are typically soft to loose, which is chronic for her.  Denies any rectal irritation or discomfort.  Has some lower abdominal discomfort.  Reports weight loss of 40 pounds last year, though states that weight has been stable since that time.  Denies nausea/vomiting.  Chart notes ulcerative colitis, though it does not appear the patient has a diagnosis of ulcerative colitis per chart review.  Denies family history of colon cancer or gastrointestinal malignancy.  Patient states she uses naproxen on occasion, less than once per week.  Denies aspirin or blood thinner use.  Flex sig 01/2020 for rectal bleeding: Multiple diverticula were found in the sigmoid colon and descending colon. Large non-bleeding external hemorrhoids with stigmata of recent bleeding.  Colonoscopy 04/2018: diverticulosis, otherwise normal  EGD 01/2020: Erosive gastropathy with no bleeding and no stigmata of recent bleeding. A 280 degree fundoplication was found. The wrap appears intact.  Past Medical History:  Diagnosis Date   Anxiety    Asthma    Bipolar 1 disorder ([place])    Depression    Diverticulitis    GERD (gastroesophageal reflux disease)    Hernia, abdominal    Hiatal hernia    Ulcerative colitis    Vaginal septum     Past  Surgical History:  Procedure Laterality Date   CLOSED REDUCTION METACARPAL WITH PERCUTANEOUS PINNING Left 03/07/2018   Procedure: CLOSED REDUCTION METACARPAL WITH PERCUTANEOUS PINNING-5th metacarpal;  Surgeon: Corky Mull, MD;  Location: [facility] ORS;  Service: Orthopedics;  Laterality: Left;   COLONOSCOPY WITH PROPOFOL N/A 05/14/2018   Procedure: COLONOSCOPY WITH PROPOFOL;  Surgeon: Lin Landsman, MD;  Location: [facility] ENDOSCOPY;  Service: Gastroenterology;  Laterality: N/A;   ESOPHAGOGASTRODUODENOSCOPY (EGD) WITH PROPOFOL N/A 05/14/2018   Procedure: ESOPHAGOGASTRODUODENOSCOPY (EGD) WITH PROPOFOL;  Surgeon: Lin Landsman, MD;  Location: [facility] ENDOSCOPY;  Service: Gastroenterology;  Laterality: N/A;   ESOPHAGOGASTRODUODENOSCOPY (EGD) WITH PROPOFOL N/A 03/06/2019   Procedure: ESOPHAGOGASTRODUODENOSCOPY (EGD) WITH PROPOFOL;  Surgeon: Lin Landsman, MD;  Location: [facility] ENDOSCOPY;  Service: Gastroenterology;  Laterality: N/A;   ESOPHAGOGASTRODUODENOSCOPY (EGD) WITH PROPOFOL N/A 02/04/2020   Procedure: ESOPHAGOGASTRODUODENOSCOPY (EGD) WITH PROPOFOL;  Surgeon: Lin Landsman, MD;  Location: [facility] ENDOSCOPY;  Service: Gastroenterology;  Laterality: N/A;   FLEXIBLE SIGMOIDOSCOPY N/A 02/04/2020   Procedure: FLEXIBLE SIGMOIDOSCOPY;  Surgeon: Lin Landsman, MD;  Location: [facility] ENDOSCOPY;  Service: Gastroenterology;  Laterality: N/A;   FRACTURE SURGERY     HARDWARE REMOVAL Left 04/03/2018   Procedure: LEFT HAND FIFTH METACARPAL PIN REMOVAL;  Surgeon: Corky Mull, MD;  Location: [place];  Service: Orthopedics;  Laterality: Left;   HERNIA REPAIR     ROBOTIC ASSISTED LAPAROSCOPIC NISSEN FUNDOPLICATION N/A 0/34/9179   Procedure: ROBOTIC ASSISTED LAPAROSCOPIC NISSEN FUNDOPLICATION;  Surgeon: Jules Husbands, MD;  Location: [facility] ORS;  Service: General;  Laterality: N/A;   TRIGGER FINGER RELEASE Left 06/19/2018  Procedure: EXTENSOR TENOLYSIS WITH CAPSULAR RELEASE OF LEFT LITTLE MCP  JOINT;  Surgeon: Corky Mull, MD;  Location: [place];  Service: Orthopedics;  Laterality: Left;   TUBAL LIGATION      Prior to Admission medications   Medication Sig Start Date End Date Taking? Authorizing Provider  budesonide-formoterol (SYMBICORT) 160-4.5 MCG/ACT inhaler Inhale 2 puffs into the lungs in the morning and at bedtime. 08/19/20  Yes Chesley Mires, MD  cyclobenzaprine (FLEXERIL) 10 MG tablet Take 10 mg by mouth 3 (three) times daily as needed for muscle spasms.    Yes [provider]  gabapentin (NEURONTIN) 600 MG tablet Take 600 mg by mouth 3 (three) times daily. 06/04/20  Yes [provider]  hydrocortisone (ANUSOL-HC) 2.5 % rectal cream Place 1 application rectally 2 (two) times daily. 08/10/20  Yes Vanga, Tally Due, MD  lamoTRIgine (LAMICTAL) 150 MG tablet Take 200 mg by mouth in the morning. 03/09/20  Yes [provider]  naproxen (NAPROSYN) 500 MG tablet Take 500 mg by mouth 2 (two) times daily. 02/14/20  Yes [provider]  ondansetron (ZOFRAN-ODT) 4 MG disintegrating tablet Take 1 tablet (4 mg total) by mouth every 6 (six) hours as needed for nausea or vomiting. 09/27/18  Yes Tylene Fantasia, PA-C  propranolol (INDERAL) 20 MG tablet Take 20 mg by mouth 4 (four) times daily. 06/16/19  Yes [provider]  QUEtiapine (SEROQUEL) 50 MG tablet Take 150 mg by mouth at bedtime. 05/25/20  Yes [provider]  topiramate (TOPAMAX) 50 MG tablet Take 50 mg by mouth 2 (two) times daily.   Yes [provider]  albuterol (VENTOLIN HFA) 108 (90 Base) MCG/ACT inhaler Inhale 2 puffs into the lungs every 6 (six) hours as needed for wheezing or shortness of breath. Patient not taking: No sig reported 08/19/20   Chesley Mires, MD    Scheduled Meds:  sodium chloride   Intravenous Once   hydrocortisone  25 mg Rectal BID   Continuous Infusions:  sodium chloride     lactated ringers 125 mL/hr at 10/27/20 0528   PRN  Meds:.  Allergies as of 10/26/2020   (No Known Allergies)    Family History  Problem Relation Age of Onset   Hypertension Sister    Hyperlipidemia Mother    Hypertension Mother    Hypertension Father    Hyperlipidemia Father     Social History   Socioeconomic History   Marital status: Single    Spouse name: Not on file   Number of children: Not on file   Years of education: Not on file   Highest education level: Not on file  Occupational History   Not on file  Tobacco Use   Smoking status: Every Day    Packs/day: 1.50    Years: 27.00    Pack years: 40.50    Types: Cigarettes   Smokeless tobacco: Never   Tobacco comments:    0.5ppd 08/19/2020  Vaping Use   Vaping Use: Never used  Substance and Sexual Activity   Alcohol use: No   Drug use: Yes    Types: Marijuana    Comment: used Monday   Sexual activity: Not Currently    Birth control/protection: I.U.D., Surgical    Comment: Mirena  Other Topics Concern   Not on file  Social History Narrative   Not on file   Social Determinants of Health   Financial Resource Strain: Not on file  Food Insecurity: Not on file  Transportation  Needs: Not on file  Physical Activity: Not on file  Stress: Not on file  Social Connections: Not on file  Intimate Partner Violence: Not on file    Review of Systems: Review of Systems  Constitutional:  Negative for chills and fever.  HENT:  Negative for hearing loss and tinnitus.   Eyes:  Negative for pain and redness.  Respiratory:  Negative for cough and shortness of breath.   Cardiovascular:  Negative for chest pain and palpitations.  Gastrointestinal:  Positive for abdominal pain, blood in stool and diarrhea. Negative for constipation, heartburn, melena, nausea and vomiting.  Genitourinary:  Negative for flank pain and hematuria.  Musculoskeletal:  Negative for falls and joint pain.  Skin:  Negative for itching and rash.  Neurological:  Negative for seizures and loss of  consciousness.  Endo/Heme/Allergies:  Negative for polydipsia. Does not bruise/bleed easily.  Psychiatric/Behavioral:  Negative for substance abuse. The patient is not nervous/anxious.     Physical Exam: Vital signs: Vitals:   10/27/20 0235 10/27/20 0523  BP: 107/70 120/79  Pulse: 94 86  Resp: 16 18  Temp: 98.9 F (37.2 C) 98.5 F (36.9 C)  SpO2: 99% 99%   Last BM Date: 10/27/20  Physical Exam Vitals reviewed.  Constitutional:      General: She is not in acute distress. HENT:     Head: Normocephalic and atraumatic.     Nose: Nose normal. No congestion.     Mouth/Throat:     Mouth: Mucous membranes are moist.     Pharynx: Oropharynx is clear.  Eyes:     Extraocular Movements: Extraocular movements intact.     Comments: Conjunctival pallor  Cardiovascular:     Rate and Rhythm: Normal rate and regular rhythm.  Pulmonary:     Effort: Pulmonary effort is normal. No respiratory distress.  Abdominal:     General: Bowel sounds are normal. There is no distension.     Palpations: Abdomen is soft. There is no mass.     Tenderness: There is no abdominal tenderness. There is no guarding or rebound.     Hernia: No hernia is present.  Musculoskeletal:        General: No swelling or tenderness.     Cervical back: Normal range of motion and neck supple.  Skin:    General: Skin is warm and dry.  Neurological:     General: No focal deficit present.     Mental Status: She is alert and oriented to person, place, and time.  Psychiatric:        Mood and Affect: Mood normal.        Behavior: Behavior normal. Behavior is cooperative.     GI:  Lab Results: Recent Labs    10/26/20 1510 10/27/20 0043 10/27/20 0333 10/27/20 0707  WBC 12.4*  --  8.0  --   HGB 6.4* 6.2* 6.8* 8.2*  HCT 20.0* 20.6* 21.8* 25.1*  PLT 320  --  282  --    BMET Recent Labs    10/26/20 1510 10/27/20 0333  NA 139 137  K 3.7 3.6  CL 111 111  CO2 21* 21*  GLUCOSE 98 98  BUN 14 10  CREATININE 0.80  0.80  CALCIUM 8.5* 8.6*   LFT Recent Labs    10/27/20 0333  PROT 5.8*  ALBUMIN 3.4*  AST 10*  ALT 12  ALKPHOS 54  BILITOT 0.6   PT/INR Recent Labs    10/27/20 0333  LABPROT 14.0  INR 1.1  Studies/Results: CT Abdomen Pelvis W Contrast  Result Date: 10/26/2020 CLINICAL DATA:  Abdominal distension, rectal bleeding for 2 months EXAM: CT ABDOMEN AND PELVIS WITH CONTRAST TECHNIQUE: Multidetector CT imaging of the abdomen and pelvis was performed using the standard protocol following bolus administration of intravenous contrast. CONTRAST:  135m OMNIPAQUE IOHEXOL 300 MG/ML  SOLN COMPARISON:  01/19/2020 FINDINGS: Lower chest: No acute abnormality. Dependent bibasilar scarring and or partial atelectasis. Hepatobiliary: No solid liver abnormality is seen. No gallstones, gallbladder wall thickening, or biliary dilatation. Pancreas: Unremarkable. No pancreatic ductal dilatation or surrounding inflammatory changes. Spleen: Normal in size without significant abnormality. Adrenals/Urinary Tract: Adrenal glands are unremarkable. Kidneys are normal, without renal calculi, solid lesion, or hydronephrosis. Bladder is unremarkable. Stomach/Bowel: Stomach is within normal limits. Appendix appears normal. No evidence of bowel wall thickening, distention, or inflammatory changes. Descending and sigmoid diverticulosis. Vascular/Lymphatic: No significant vascular findings are present. No enlarged abdominal or pelvic lymph nodes. Reproductive: No mass or other significant abnormality. IUD is present in the endometrial cavity. Small bilateral functional ovarian cysts and follicles. Other: No abdominal wall hernia or abnormality. No abdominopelvic ascites. Musculoskeletal: No acute or significant osseous findings. IMPRESSION: 1. No CT findings of the abdomen or pelvis to explain abdominal pain or rectal bleeding. 2. Descending and sigmoid diverticulosis without evidence of acute diverticulitis. 3. IUD is present in  the endometrial cavity. Electronically Signed   By: AEddie CandleM.D.   On: 10/26/2020 16:50    Impression: Rectal bleeding: hemorrhoidal vs. Diverticular -Hgb 6.8, decreased from 14.6 in 01/2020 -History of hemorrhoidal bleeding, flex sig in 01/2020 showed external hemorrhoids with stigmata of bleeding.  Colonoscopy in 2020 pertinent only for diverticulosis -Normal BUN/Cr  History of Nissen Fundoplication  Plan: Nuclear bleeding scan to rule out active bleeding.  Start hydrocortisone suppositories BID.  Clear liquid diet.  Continue to monitor H&H with transfusion as needed to maintain Hgb >7.  [facility] will follow.    LOS: 1 day   ASalley Slaughter PA-C 10/27/2020, 10:15 AM  Contact #  [phone]

## 2020-10-27 NOTE — Discharge Summary (Signed)
   PT LEFT AMA SUMMARY  Victoria Pierce MRN - [id number] DOB - 1980/10/20  Date of Admission - 10/26/2020 Date LEFT AMA: 10/27/2020  Attending Physician:  Cherene Altes  Patient's PCP:  [name], MD  Disposition: LEFT AMA  Follow-up Appts:  Not able to be arranged or discussed as pt LEFT AMA  Diagnoses at time pt LEFT AMA: Acute blood loss anemia Syncope Acute on chronic GI bleeding / Recurrent rectal bleeding Ulcerative colitis Asthma  Initial presentation: 40 year old with a history of ulcerative colitis, diverticulosis, and asthma who presented to [place] [facility] with crampy abdominal pain and intermittent bloody stools for 6 months.  She further reported that she had passed out twice within the last 2 weeks.  In the ED she was found to have a hemoglobin of 6.4 but was otherwise hemodynamically stable.  CT abdomen and pelvis was without acute findings.  Hospital Course: Listed below are the active problems present, and the status of the care of these problems, at the time the pt decided to LEAVE AMA:  Acute blood loss anemia -syncope Hemoglobin has improved with transfusion of 1U PRBC - coags normal - monitor hemoglobin in a serial fashion - consider Fe infusion  Acute on chronic GI bleeding / Recurrent rectal bleeding Extensive previous work-up to include colonoscopy 2020 as well as flex sig October 2021 noting diverticulosis as well as internal and external hemorrhoids - results of nuclear medicine bleeding scan pending - Anusol suppositories started per GI -no further interventions planned at this time - reassess Hgb in AM to assure further transfusion not indicated    Ulcerative colitis   Asthma      Medication List    Unable to be finalized as pt LEFT AMA  Day of Discharge Wt Readings from Last 3 Encounters:  10/26/20 68 kg  08/19/20 69.4 kg  08/10/20 68.5 kg   Temp Readings from Last 3 Encounters:  10/27/20 98.4 F (36.9 C) (Oral)   08/19/20 97.6 F (36.4 C) (Temporal)  08/10/20 98 F (36.7 C) (Oral)   BP Readings from Last 3 Encounters:  10/27/20 133/84  08/19/20 124/78  08/10/20 125/86   Pulse Readings from Last 3 Encounters:  10/27/20 (!) 105  08/19/20 79  08/10/20 98    Physical Exam: Exam not able to be completed at time of d/c as pt LEFT AMA  7:16 PM 10/27/20  Cherene Altes, MD [facility] Office  [phone]

## 2020-10-27 NOTE — H&P (Addendum)
History and Physical  [name] XFG:[id number] DOB: [birth year] DOA: 10/26/2020  Referring physician: Lacretia Leigh, MD PCP: [name], MD  Patient coming from: Home  Chief Complaint: Bright red blood per rectum for several months  HPI: Victoria Pierce is a 40 y.o. female with medical history significant for ulcerative colitis, diverticulitis, diverticulosis, asthma who presents to [facility] due to abdominal cramping in lower quadrants (described as similar to menstrual cramps), she also complained of some milder upper abdominal pain which was described as hernia pain.  She states that she has been intermittently noticing blood in the commode on and off since last 6 months and she states that she passed out twice within the last 2 weeks with last incident being on Monday night (7/11).on both occasions, witnessed by boyfriend, she only passed out for few seconds and quickly recovered without any postictal state.  She decided to go to the ED for evaluation and management since she thought it could be due to persistent loss of blood.  She denies fever, chills, chest pain, shortness of breath, sick contact.   ED Course:  In the emergency department, she was hemodynamically stable.  Work-up in the ED showed hemoglobin of 6.4 (this was 14.6 about 9 months ago-01/19/2020).  BMP was normal except for bicarb of 21.  Urinalysis was negative for UTI.  Magnesium 1.8, phosphorus 2.7.  Influenza A, B and SARS coronavirus 2 negative. CT abdomen pelvis with contrast showed no CT findings of the abdomen or pelvis to explain abdominal pain or rectal bleeding.  Descending and sigmoid diverticulosis without evidence of acute diverticulitis noted Gastroenterology was consulted and will see patient when patient is admitted to [facility].  Hospitalist was asked to admit patient for further evaluation and management.  Review of Systems: Constitutional: Negative for chills and fever.   HENT: Negative for ear pain and sore throat.   Eyes: Negative for pain and visual disturbance.  Respiratory: Negative for cough, chest tightness and shortness of breath.   Cardiovascular: Negative for chest pain and palpitations.  Gastrointestinal: Positive for abdominal pain and bright red blood per rectum.  Negative for vomiting.  Endocrine: Negative for polyphagia and polyuria.  Genitourinary: Negative for decreased urine volume, dysuria, enuresis Musculoskeletal: Negative for arthralgias and back pain.  Skin: Negative for color change and rash.  Allergic/Immunologic: Negative for immunocompromised state.  Neurological: Negative for tremors, syncope, speech difficulty, weakness, light-headedness and headaches.  Hematological: Does not bruise/bleed easily.  All other systems reviewed and are negative   Past Medical History:  Diagnosis Date   Anxiety    Asthma    Bipolar 1 disorder ([place])    Depression    Diverticulitis    GERD (gastroesophageal reflux disease)    Hernia, abdominal    Hiatal hernia    Ulcerative colitis    Vaginal septum    Past Surgical History:  Procedure Laterality Date   CLOSED REDUCTION METACARPAL WITH PERCUTANEOUS PINNING Left 03/07/2018   Procedure: CLOSED REDUCTION METACARPAL WITH PERCUTANEOUS PINNING-5th metacarpal;  Surgeon: Corky Mull, MD;  Location: [facility] ORS;  Service: Orthopedics;  Laterality: Left;   COLONOSCOPY WITH PROPOFOL N/A 05/14/2018   Procedure: COLONOSCOPY WITH PROPOFOL;  Surgeon: Lin Landsman, MD;  Location: [facility] ENDOSCOPY;  Service: Gastroenterology;  Laterality: N/A;   ESOPHAGOGASTRODUODENOSCOPY (EGD) WITH PROPOFOL N/A 05/14/2018   Procedure: ESOPHAGOGASTRODUODENOSCOPY (EGD) WITH PROPOFOL;  Surgeon: Lin Landsman, MD;  Location: [facility] ENDOSCOPY;  Service: Gastroenterology;  Laterality: N/A;   ESOPHAGOGASTRODUODENOSCOPY (EGD)  WITH PROPOFOL N/A 03/06/2019   Procedure: ESOPHAGOGASTRODUODENOSCOPY (EGD) WITH PROPOFOL;  Surgeon:  Lin Landsman, MD;  Location: [facility] ENDOSCOPY;  Service: Gastroenterology;  Laterality: N/A;   ESOPHAGOGASTRODUODENOSCOPY (EGD) WITH PROPOFOL N/A 02/04/2020   Procedure: ESOPHAGOGASTRODUODENOSCOPY (EGD) WITH PROPOFOL;  Surgeon: Lin Landsman, MD;  Location: [facility] ENDOSCOPY;  Service: Gastroenterology;  Laterality: N/A;   FLEXIBLE SIGMOIDOSCOPY N/A 02/04/2020   Procedure: FLEXIBLE SIGMOIDOSCOPY;  Surgeon: Lin Landsman, MD;  Location: [facility] ENDOSCOPY;  Service: Gastroenterology;  Laterality: N/A;   FRACTURE SURGERY     HARDWARE REMOVAL Left 04/03/2018   Procedure: LEFT HAND FIFTH METACARPAL PIN REMOVAL;  Surgeon: Corky Mull, MD;  Location: [facility];  Service: Orthopedics;  Laterality: Left;   HERNIA REPAIR     ROBOTIC ASSISTED LAPAROSCOPIC NISSEN FUNDOPLICATION N/A 9/35/7017   Procedure: ROBOTIC ASSISTED LAPAROSCOPIC NISSEN FUNDOPLICATION;  Surgeon: Jules Husbands, MD;  Location: [facility] ORS;  Service: General;  Laterality: N/A;   TRIGGER FINGER RELEASE Left 06/19/2018   Procedure: EXTENSOR TENOLYSIS WITH CAPSULAR RELEASE OF LEFT LITTLE MCP JOINT;  Surgeon: Corky Mull, MD;  Location: [facility];  Service: Orthopedics;  Laterality: Left;   TUBAL LIGATION      Social History:  reports that she has been smoking cigarettes. She has a 40.50 pack-year smoking history. She has never used smokeless tobacco. She reports current drug use. Drug: Marijuana. She reports that she does not drink alcohol.   No Known Allergies  Family History  Problem Relation Age of Onset   Hypertension Sister    Hyperlipidemia Mother    Hypertension Mother    Hypertension Father    Hyperlipidemia Father       Prior to Admission medications   Medication Sig Start Date End Date Taking? Authorizing Provider  budesonide-formoterol (SYMBICORT) 160-4.5 MCG/ACT inhaler Inhale 2 puffs into the lungs in the morning and at bedtime. 08/19/20  Yes Chesley Mires, MD  cyclobenzaprine (FLEXERIL)  10 MG tablet Take 10 mg by mouth 3 (three) times daily as needed for muscle spasms.    Yes [provider]  gabapentin (NEURONTIN) 600 MG tablet Take 600 mg by mouth 3 (three) times daily. 06/04/20  Yes [provider]  hydrocortisone (ANUSOL-HC) 2.5 % rectal cream Place 1 application rectally 2 (two) times daily. 08/10/20  Yes Vanga, Tally Due, MD  lamoTRIgine (LAMICTAL) 150 MG tablet Take 200 mg by mouth in the morning. 03/09/20  Yes [provider]  naproxen (NAPROSYN) 500 MG tablet Take 500 mg by mouth 2 (two) times daily. 02/14/20  Yes [provider]  ondansetron (ZOFRAN-ODT) 4 MG disintegrating tablet Take 1 tablet (4 mg total) by mouth every 6 (six) hours as needed for nausea or vomiting. 09/27/18  Yes Tylene Fantasia, PA-C  propranolol (INDERAL) 20 MG tablet Take 20 mg by mouth 4 (four) times daily. 06/16/19  Yes [provider]  QUEtiapine (SEROQUEL) 50 MG tablet Take 150 mg by mouth at bedtime. 05/25/20  Yes [provider]  topiramate (TOPAMAX) 50 MG tablet Take 50 mg by mouth 2 (two) times daily.   Yes [provider]  albuterol (VENTOLIN HFA) 108 (90 Base) MCG/ACT inhaler Inhale 2 puffs into the lungs every 6 (six) hours as needed for wheezing or shortness of breath. Patient not taking: No sig reported 08/19/20   Chesley Mires, MD    Physical Exam: BP 120/79   Pulse 86   Temp 98.5 F (36.9 C) (Oral)   Resp 18   Ht  5' 3"  (1.6 m)   Wt 68 kg   SpO2 99%   BMI 26.57 kg/m   General: 40 y.o. year-old female well developed well nourished in no acute distress.  Alert and oriented x3. HEENT: NCAT, EOMI Neck: Supple, trachea medial Cardiovascular: Regular rate and rhythm with no rubs or gallops.  No thyromegaly or JVD noted.  No lower extremity edema. 2/4 pulses in all 4 extremities. Respiratory: Clear to auscultation with no wheezes or rales. Good inspiratory effort. Abdomen: Soft, nontender nondistended with normal bowel  sounds x4 quadrants. Muskuloskeletal: No cyanosis, clubbing or edema noted bilaterally Neuro: CN II-XII intact, strength 5/5 x 4, sensation, reflexes intact Skin: No ulcerative lesions noted or rashes Psychiatry: Judgement and insight appear normal. Mood is appropriate for condition and setting          Labs on Admission:  Basic Metabolic Panel: Recent Labs  Lab 10/26/20 1510 10/27/20 0333  NA 139 137  K 3.7 3.6  CL 111 111  CO2 21* 21*  GLUCOSE 98 98  BUN 14 10  CREATININE 0.80 0.80  CALCIUM 8.5* 8.6*  MG  --  1.8  PHOS  --  2.7   Liver Function Tests: Recent Labs  Lab 10/26/20 1510 10/27/20 0333  AST 9* 10*  ALT 9 12  ALKPHOS 48 54  BILITOT 0.3 0.6  PROT 5.8* 5.8*  ALBUMIN 3.9 3.4*   Recent Labs  Lab 10/26/20 1510  LIPASE <10*   No results for input(s): AMMONIA in the last 168 hours. CBC: Recent Labs  Lab 10/26/20 1510 10/27/20 0043 10/27/20 0333 10/27/20 0707  WBC 12.4*  --  8.0  --   NEUTROABS 8.9*  --   --   --   HGB 6.4* 6.2* 6.8* 8.2*  HCT 20.0* 20.6* 21.8* 25.1*  MCV 88.9  --  91.2  --   PLT 320  --  282  --    Cardiac Enzymes: No results for input(s): CKTOTAL, CKMB, CKMBINDEX, TROPONINI in the last 168 hours.  BNP (last 3 results) No results for input(s): BNP in the last 8760 hours.  ProBNP (last 3 results) No results for input(s): PROBNP in the last 8760 hours.  CBG: No results for input(s): GLUCAP in the last 168 hours.  Radiological Exams on Admission: CT Abdomen Pelvis W Contrast  Result Date: 10/26/2020 CLINICAL DATA:  Abdominal distension, rectal bleeding for 2 months EXAM: CT ABDOMEN AND PELVIS WITH CONTRAST TECHNIQUE: Multidetector CT imaging of the abdomen and pelvis was performed using the standard protocol following bolus administration of intravenous contrast. CONTRAST:  120m OMNIPAQUE IOHEXOL 300 MG/ML  SOLN COMPARISON:  01/19/2020 FINDINGS: Lower chest: No acute abnormality. Dependent bibasilar scarring and or partial  atelectasis. Hepatobiliary: No solid liver abnormality is seen. No gallstones, gallbladder wall thickening, or biliary dilatation. Pancreas: Unremarkable. No pancreatic ductal dilatation or surrounding inflammatory changes. Spleen: Normal in size without significant abnormality. Adrenals/Urinary Tract: Adrenal glands are unremarkable. Kidneys are normal, without renal calculi, solid lesion, or hydronephrosis. Bladder is unremarkable. Stomach/Bowel: Stomach is within normal limits. Appendix appears normal. No evidence of bowel wall thickening, distention, or inflammatory changes. Descending and sigmoid diverticulosis. Vascular/Lymphatic: No significant vascular findings are present. No enlarged abdominal or pelvic lymph nodes. Reproductive: No mass or other significant abnormality. IUD is present in the endometrial cavity. Small bilateral functional ovarian cysts and follicles. Other: No abdominal wall hernia or abnormality. No abdominopelvic ascites. Musculoskeletal: No acute or significant osseous findings. IMPRESSION: 1. No CT findings of the abdomen  or pelvis to explain abdominal pain or rectal bleeding. 2. Descending and sigmoid diverticulosis without evidence of acute diverticulitis. 3. IUD is present in the endometrial cavity. Electronically Signed   By: Eddie Candle M.D.   On: 10/26/2020 16:50    EKG: I independently viewed the EKG done and my findings are as followed: Normal sinus rhythm at a rate of 92 bpm  Assessment/Plan Present on Admission: **None**  Active Problems:   Symptomatic anemia   Syncope   Diverticulosis   Asthma  Symptomatic anemia in the setting of possible diverticulosis Hemoglobin 6.4 > 6.2 > 6.8 Type and screen was done and 1 unit of PRBC was transfused with increased hemoglobin at 8.2  Descending and sigmoid diverticulosis noted on CT abdomen and pelvis with no diverticulitis Clearly liquid diet per gastroenterology recommendation. Gastroenterology was consulted by  [facility] ED physician and will see patient when admitted to [facility], we shall await further recommendation.  Syncope possibly secondary to GI blood loss Patient reported 2 episodes of syncope within last 2 weeks.  She denies palpitations, chest pain.  EKG was normal This this was possibly due to continuous loss of blood over several months, considering patient's hemoglobin was 6.4 (which was a drop from 14.6 about 9 months ago) Orthostatic vital signs will be checked Echocardiogram done on 07/13/2020 showed LVEF of 55 to 60%  Asthma Continue Dulera  Other Home medications will be updated once med rec is updated by pharmacy   DVT prophylaxis: SCDs  Code Status: Full code  Family Communication: None at bedside  Disposition Plan:  Patient is from:                        home Anticipated DC to:                   SNF or family members home Anticipated DC date:               2-3 days Anticipated DC barriers:          Patient requires inpatient management due to symptomatic anemia which requires further work-up and pending gastroenterology consult  Consults called: Gastroenterology  Admission status: Inpatient    Bernadette Hoit MD [facility]  10/27/2020, 8:33 AM

## 2020-10-27 NOTE — Progress Notes (Signed)
Victoria Pierce  PYP:[id number] DOB: [birth year] DOA: 10/26/2020 PCP: [name], MD    Brief Narrative:  40 year old with a history of ulcerative colitis, diverticulosis, and asthma who presented to [place] [facility] with crampy abdominal pain and intermittent bloody stools for 6 months.  She further reported that she had passed out twice within the last 2 weeks.  In the ED she was found to have a hemoglobin of 6.4 but was otherwise hemodynamically stable.  CT abdomen and pelvis was without acute findings.  Significant Events:  7/13 admit via [place]  Consultants:  [facility]  Code Status: FULL CODE  Antimicrobials:  None  DVT prophylaxis: SCDs  Subjective: Afebrile.  Vital signs stable.  Saturation 99% on room air.  Anxious to get out of the hospital soon as possible.  No new complaints.  Denies chest pain or shortness of breath.  No lightheadedness or dizziness.  Assessment & Plan:  Acute blood loss anemia -syncope Hemoglobin has improved with transfusion of 1U PRBC - coags normal -monitor hemoglobin in a serial fashion - consider Fe infusion  Recent Labs  Lab 10/26/20 1510 10/27/20 0043 10/27/20 0333 10/27/20 0707  HGB 6.4* 6.2* 6.8* 8.2*    Acute on chronic GI bleeding / Recurrent rectal bleeding  Extensive previous work-up to include colonoscopy 2020 as well as flex sig October 2021 noting diverticulosis as well as internal and external hemorrhoids - results of nuclear medicine bleeding scan pending - Anusol suppositories started per GI -no further interventions planned at this time  Ulcerative colitis  Asthma   Family Communication:  Status is: Inpatient  Remains inpatient appropriate because:Inpatient level of care appropriate due to severity of illness  Dispo: The patient is from: Home              Anticipated d/c is to: Home              Patient currently is not medically stable to d/c.   Difficult to place patient  No   Objective: Blood pressure 120/79, pulse 86, temperature 98.5 F (36.9 C), temperature source Oral, resp. rate 18, height 5' 3"  (1.6 m), weight 68 kg, SpO2 99 %.  Intake/Output Summary (Last 24 hours) at 10/27/2020 0835 Last data filed at 10/27/2020 0519 Gross per 24 hour  Intake 988.85 ml  Output --  Net 988.85 ml   Filed Weights   10/26/20 1442 10/26/20 1502  Weight: 68 kg 68 kg    Examination: General: No acute respiratory distress Lungs: Clear to auscultation bilaterally without wheezes or crackles Cardiovascular: Regular rate and rhythm without murmur gallop or rub normal S1 and S2 Abdomen: Nontender, nondistended, soft, bowel sounds positive, no rebound, no ascites, no appreciable mass Extremities: No significant cyanosis, clubbing, or edema bilateral lower extremities  CBC: Recent Labs  Lab 10/26/20 1510 10/27/20 0043 10/27/20 0333 10/27/20 0707  WBC 12.4*  --  8.0  --   NEUTROABS 8.9*  --   --   --   HGB 6.4* 6.2* 6.8* 8.2*  HCT 20.0* 20.6* 21.8* 25.1*  MCV 88.9  --  91.2  --   PLT 320  --  282  --    Basic Metabolic Panel: Recent Labs  Lab 10/26/20 1510 10/27/20 0333  NA 139 137  K 3.7 3.6  CL 111 111  CO2 21* 21*  GLUCOSE 98 98  BUN 14 10  CREATININE 0.80 0.80  CALCIUM 8.5* 8.6*  MG  --  1.8  PHOS  --  2.7   GFR: Estimated Creatinine Clearance: 86.5 mL/min (by C-G formula based on SCr of 0.8 mg/dL).  Liver Function Tests: Recent Labs  Lab 10/26/20 1510 10/27/20 0333  AST 9* 10*  ALT 9 12  ALKPHOS 48 54  BILITOT 0.3 0.6  PROT 5.8* 5.8*  ALBUMIN 3.9 3.4*   Recent Labs  Lab 10/26/20 1510  LIPASE <10*    Coagulation Profile: Recent Labs  Lab 10/27/20 0333  INR 1.1     Recent Results (from the past 240 hour(s))  Resp Panel by RT-PCR (Flu A&B, Covid) Nasopharyngeal Swab     Status: None   Collection Time: 10/26/20  4:41 PM   Specimen: Nasopharyngeal Swab; Nasopharyngeal(NP) swabs in vial transport medium  Result Value Ref  Range Status   SARS Coronavirus 2 by RT PCR NEGATIVE NEGATIVE Final    Comment: (NOTE) SARS-CoV-2 target nucleic acids are NOT DETECTED.  The SARS-CoV-2 RNA is generally detectable in upper respiratory specimens during the acute phase of infection. The lowest concentration of SARS-CoV-2 viral copies this assay can detect is 138 copies/mL. A negative result does not preclude SARS-Cov-2 infection and should not be used as the sole basis for treatment or other patient management decisions. A negative result may occur with  improper specimen collection/handling, submission of specimen other than nasopharyngeal swab, presence of viral mutation(s) within the areas targeted by this assay, and inadequate number of viral copies(<138 copies/mL). A negative result must be combined with clinical observations, patient history, and epidemiological information. The expected result is Negative.  Fact Sheet for Patients:  EntrepreneurPulse.com.au  Fact Sheet for Healthcare Providers:  IncredibleEmployment.be  This test is no t yet approved or cleared by the [place] FDA and  has been authorized for detection and/or diagnosis of SARS-CoV-2 by FDA under an Emergency Use Authorization (EUA). This EUA will remain  in effect (meaning this test can be used) for the duration of the COVID-19 declaration under Section 564(b)(1) of the Act, 21 U.S.C.section 360bbb-3(b)(1), unless the authorization is terminated  or revoked sooner.       Influenza A by PCR NEGATIVE NEGATIVE Final   Influenza B by PCR NEGATIVE NEGATIVE Final    Comment: (NOTE) The Xpert Xpress SARS-CoV-2/FLU/RSV plus assay is intended as an aid in the diagnosis of influenza from Nasopharyngeal swab specimens and should not be used as a sole basis for treatment. Nasal washings and aspirates are unacceptable for Xpert Xpress SARS-CoV-2/FLU/RSV testing.  Fact Sheet for  Patients: EntrepreneurPulse.com.au  Fact Sheet for Healthcare Providers: IncredibleEmployment.be  This test is not yet approved or cleared by the [place] FDA and has been authorized for detection and/or diagnosis of SARS-CoV-2 by FDA under an Emergency Use Authorization (EUA). This EUA will remain in effect (meaning this test can be used) for the duration of the COVID-19 declaration under Section 564(b)(1) of the Act, 21 U.S.C. section 360bbb-3(b)(1), unless the authorization is terminated or revoked.  Performed at [facility], [address], [place], [place] [address]      Scheduled Meds:  sodium chloride   Intravenous Once   Continuous Infusions:  sodium chloride     lactated ringers 125 mL/hr at 10/27/20 0528     LOS: 1 day   Cherene Altes, MD [facility] Office  [phone] Pager - Text Page per Shea Evans  If 7PM-7AM, please contact night-coverage per Amion 10/27/2020, 8:35 AM

## 2020-10-27 NOTE — Telephone Encounter (Signed)
Patient called to schedule a follow up appointment with Dr. Marius Ditch. She states that she is currently admitted in to [facility] long and wants a appointment with Dr. Marius Ditch. Informed patient I would check to see when Dr. Marius Ditch next appointment is. Informed patient that her next available appointment was 12/30/2020.  She states she was suppose to do a stool sample but never did it. She asked if she could see another provider. Informed patient no you have to stay with the same provider in our office because they do not see other providers patients. Asked patient if she wanted me to make appointment. She said no and disconnected the phone.

## 2020-10-31 LAB — TYPE AND SCREEN
ABO/RH(D): A NEG
Antibody Screen: NEGATIVE
Unit division: 0
Unit division: 0
Unit division: 0

## 2020-10-31 LAB — BPAM RBC
Blood Product Expiration Date: 202208032359
Blood Product Expiration Date: 202208042359
Blood Product Expiration Date: 202208062359
ISSUE DATE / TIME: 202207130215
Unit Type and Rh: 600
Unit Type and Rh: 600
Unit Type and Rh: 600

## 2020-10-31 IMAGING — CT CT CHEST W/ CM
2 of 4 series · 15 of 36 positions shown, 18 images · IV contrast (omnipaque)
Comparison: None.

CLINICAL DATA: Epigastric pain. Prior history of hiatal hernia.
Status post endoscopy April 2018.

EXAM:
CT CHEST WITH CONTRAST
TECHNIQUE: Multidetector CT imaging of the chest was performed during
intravenous contrast administration.
CONTRAST:  75mL OMNIPAQUE IOHEXOL 300 MG/ML  SOLN

[Series 2: axial chest · axial · 0.68mm/px · z∈[-1318,-1026]mm · 12 of 174 slices shown, 15 images]
[im 14/174  mediastinal]
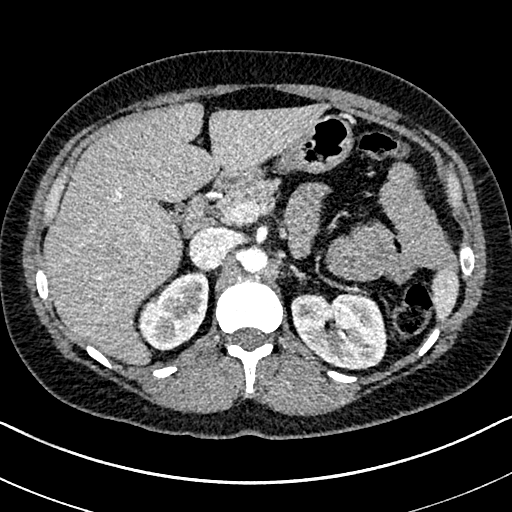
[im 14/174  lung]
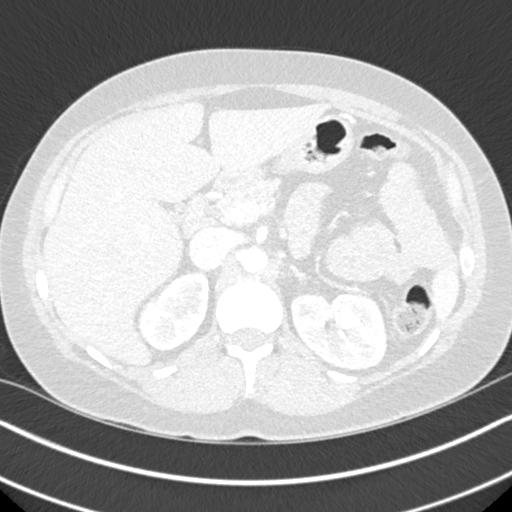
[im 27/174  lung]
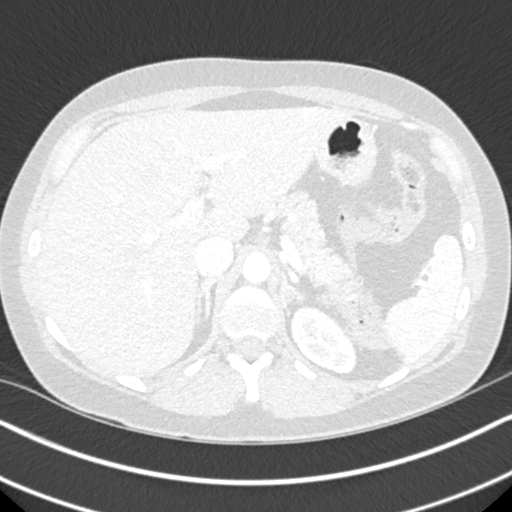
[im 40/174  lung]
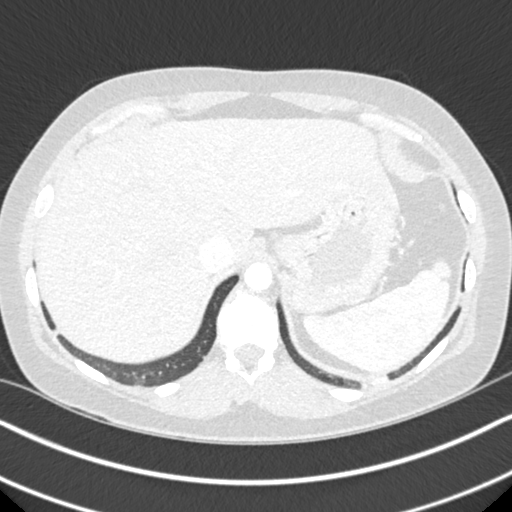
[im 54/174  lung]
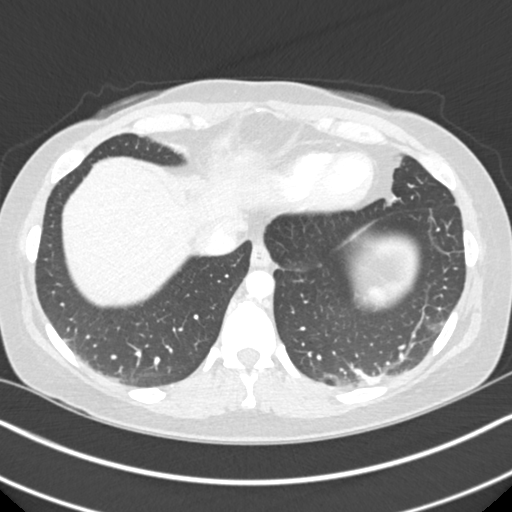
[im 67/174  mediastinal]
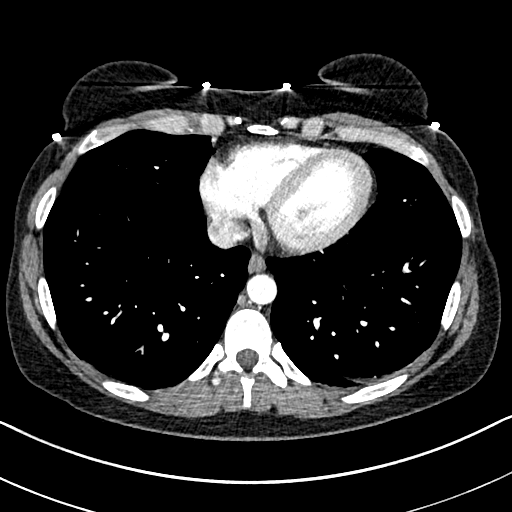
[im 67/174  lung]
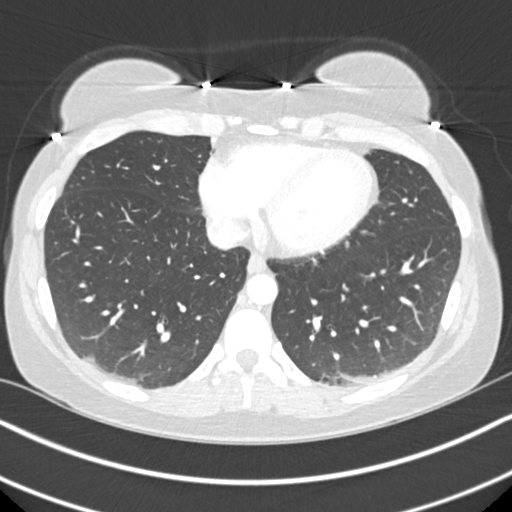
[im 80/174  lung]
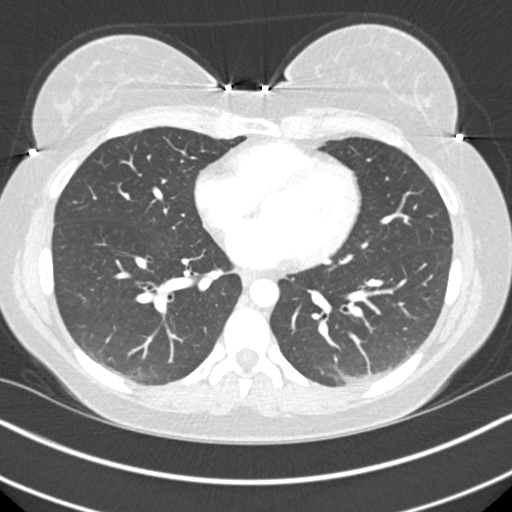
[im 94/174  lung]
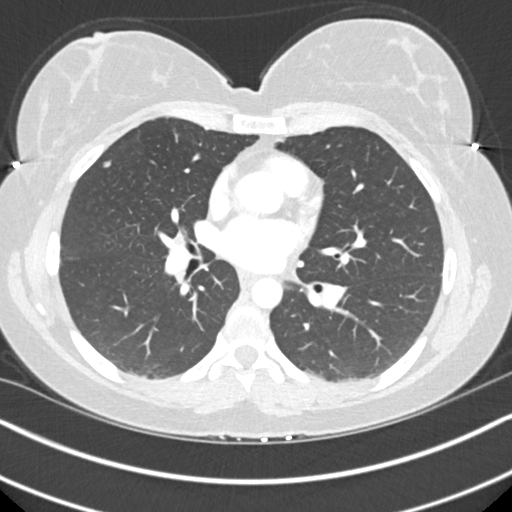
[im 107/174  lung]
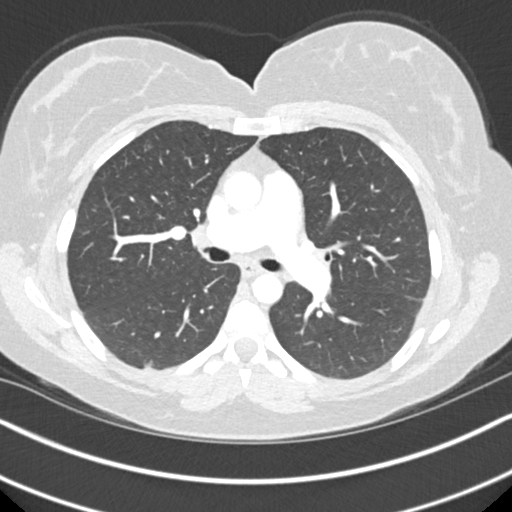
[im 120/174  mediastinal]
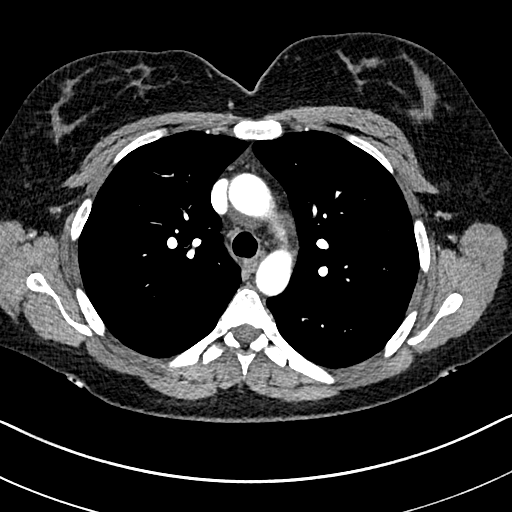
[im 120/174  lung]
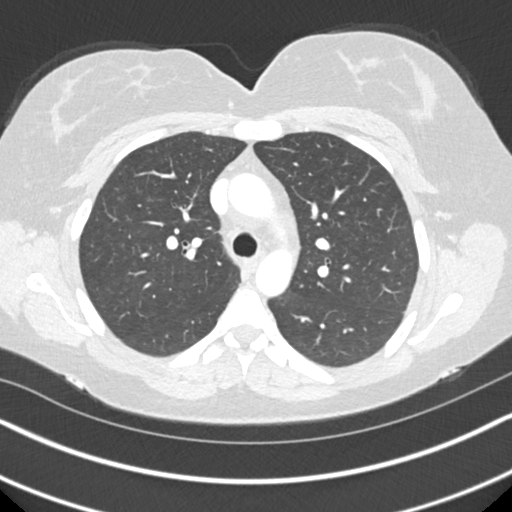
[im 134/174  lung]
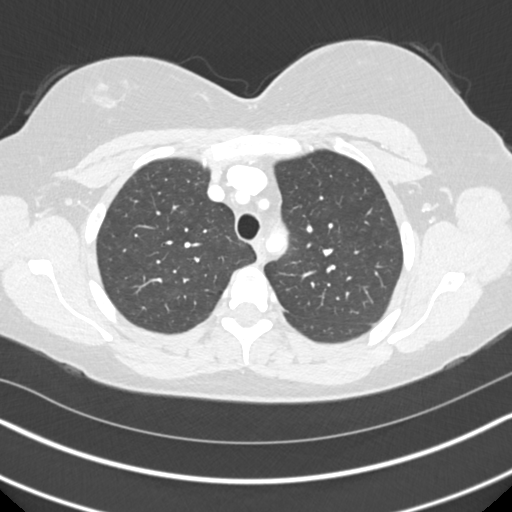
[im 147/174  lung]
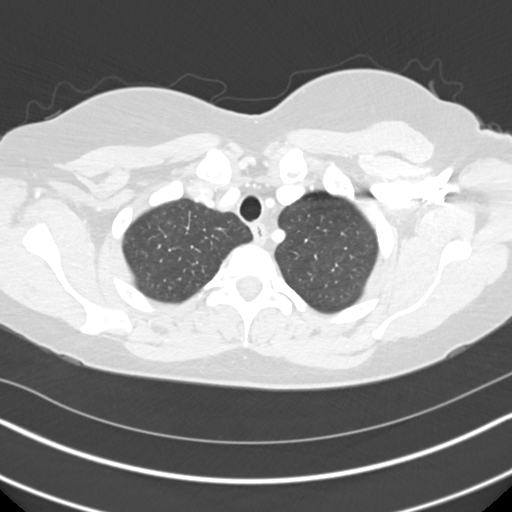
[im 160/174  lung]
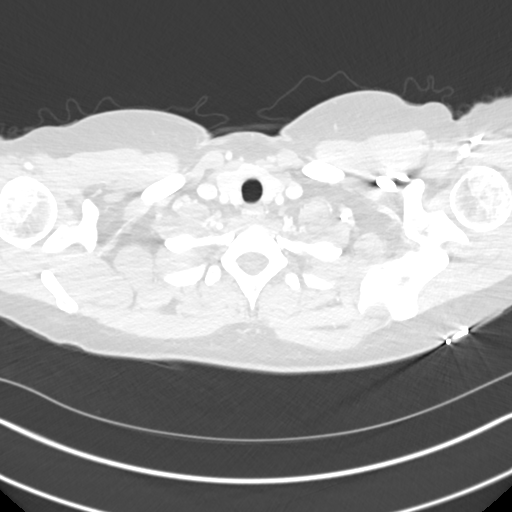

[Series 4: coronal chest · coronal · 0.68mm/px · 3 of 140 slices shown]
[im 28/140  lung]
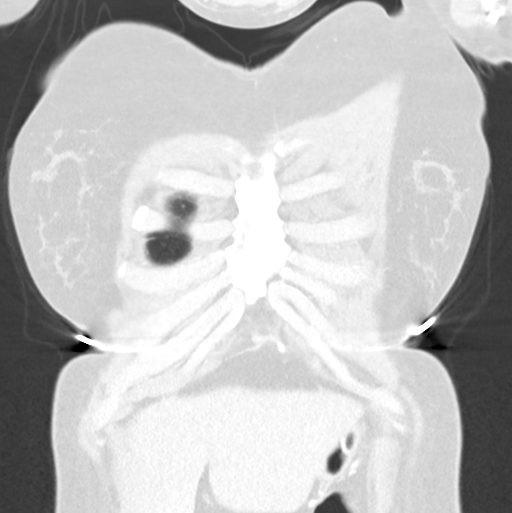
[im 56/140  lung]
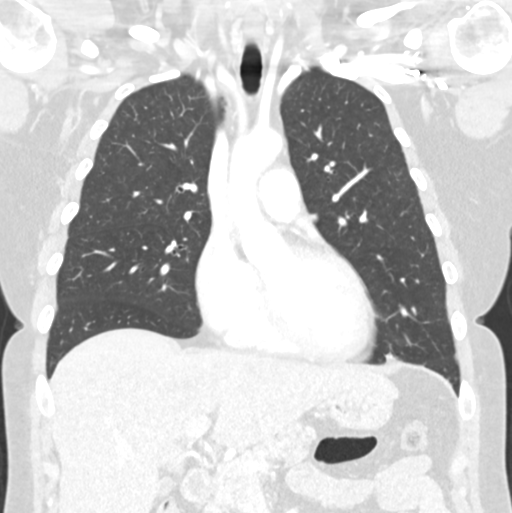
[im 84/140  lung]
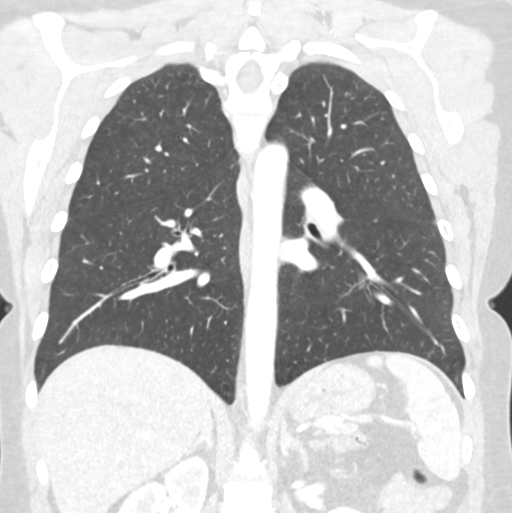

[15 of 36 positions shown; findings below may reference images not displayed]

FINDINGS: Cardiovascular: No significant vascular findings. Normal heart size.
No pericardial effusion.

Mediastinum/Nodes: No enlarged mediastinal, hilar, or axillary lymph
nodes. Thyroid gland, trachea, and esophagus demonstrate no
significant findings.

Lungs/Pleura: Left basilar scarring versus atelectasis. Mild right
basilar atelectasis. 6 mm right middle lobe pulmonary nodule. 4 mm
right upper lobe pulmonary nodule (image 64/series 3). No pleural
effusion or pneumothorax. Patchy areas of ground-glass opacities in
bilateral upper lobes which may be secondary to an infectious or
inflammatory etiology.

Upper Abdomen: No acute abnormality. No evidence of a hiatal hernia.

Musculoskeletal: No chest wall abnormality. No acute or significant
osseous findings.
IMPRESSION: 1. No hiatal hernia.
2. 6 mm right middle lobe pulmonary nodule. 4 mm right upper lobe
pulmonary nodule (image 64/series 3). Non-contrast chest CT at 3-6
months is recommended. If the nodules are stable at time of repeat
CT, then future CT at 18-24 months (from today's scan) is considered
optional for low-risk patients, but is recommended for high-risk
patients. This recommendation follows the consensus statement:
Guidelines for Management of Incidental Pulmonary Nodules Detected
3. Patchy areas of ground-glass opacities in bilateral upper lobes
which may be secondary to an infectious or inflammatory etiology.

## 2020-11-02 ENCOUNTER — Other Ambulatory Visit: Payer: Self-pay

## 2020-11-02 ENCOUNTER — Emergency Department (HOSPITAL_BASED_OUTPATIENT_CLINIC_OR_DEPARTMENT_OTHER)
Admission: EM | Admit: 2020-11-02 | Discharge: 2020-11-03 | Disposition: A | Discharge status: Home / Self Care | Payer: Medicaid Other | Attending: Emergency Medicine | Admitting: Emergency Medicine

## 2020-11-02 ENCOUNTER — Encounter (HOSPITAL_BASED_OUTPATIENT_CLINIC_OR_DEPARTMENT_OTHER): Payer: Self-pay | Admitting: Obstetrics and Gynecology

## 2020-11-02 DIAGNOSIS — F17210 Nicotine dependence, cigarettes, uncomplicated: Secondary | ICD-10-CM | POA: Insufficient documentation

## 2020-11-02 DIAGNOSIS — K625 Hemorrhage of anus and rectum: Secondary | ICD-10-CM | POA: Insufficient documentation

## 2020-11-02 DIAGNOSIS — J45909 Unspecified asthma, uncomplicated: Secondary | ICD-10-CM | POA: Diagnosis not present

## 2020-11-02 DIAGNOSIS — D649 Anemia, unspecified: Secondary | ICD-10-CM | POA: Diagnosis not present

## 2020-11-02 DIAGNOSIS — R109 Unspecified abdominal pain: Secondary | ICD-10-CM | POA: Insufficient documentation

## 2020-11-02 LAB — COMPREHENSIVE METABOLIC PANEL
ALT: 10 U/L (ref 0–44)
AST: 9 U/L — ABNORMAL LOW (ref 15–41)
Albumin: 4.1 g/dL (ref 3.5–5.0)
Alkaline Phosphatase: 61 U/L (ref 38–126)
Anion gap: 7 (ref 5–15)
BUN: 14 mg/dL (ref 6–20)
CO2: 23 mmol/L (ref 22–32)
Calcium: 8.7 mg/dL — ABNORMAL LOW (ref 8.9–10.3)
Chloride: 109 mmol/L (ref 98–111)
Creatinine, Ser: 0.96 mg/dL (ref 0.44–1.00)
GFR, Estimated: >60 mL/min (ref >60)
Glucose, Bld: 159 mg/dL — ABNORMAL HIGH (ref 70–99)
Potassium: 3.5 mmol/L (ref 3.5–5.1)
Sodium: 139 mmol/L (ref 135–145)
Total Bilirubin: 0.3 mg/dL (ref 0.3–1.2)
Total Protein: 6.5 g/dL (ref 6.5–8.1)

## 2020-11-02 LAB — CBC
HCT: 24.6 % — ABNORMAL LOW (ref 36.0–46.0)
Hemoglobin: 7.5 g/dL — ABNORMAL LOW (ref 12.0–15.0)
MCH: 27.3 pg (ref 26.0–34.0)
MCHC: 30.5 g/dL (ref 30.0–36.0)
MCV: 89.5 fL (ref 80.0–100.0)
Platelets: 314 10*3/uL (ref 150–400)
RBC: 2.75 MIL/uL — ABNORMAL LOW (ref 3.87–5.11)
RDW: 14.6 % (ref 11.5–15.5)
WBC: 10 10*3/uL (ref 4.0–10.5)
nRBC: 0 % (ref 0.0–0.2)

## 2020-11-02 NOTE — ED Triage Notes (Signed)
Patient reports she recently left AMA from [facility] after being admitted for low hgb. Patient reports she followed up with her PCP and was told she still has low Hgn, at a 7, and that she needs a colonoscopy. Patient states she was anxious and had panic and left [facility] because of this  Patient reports mild abdominal cramping due to starting her period.

## 2020-11-03 ENCOUNTER — Ambulatory Visit (INDEPENDENT_AMBULATORY_CARE_PROVIDER_SITE_OTHER): Payer: Medicaid Other | Admitting: Gastroenterology

## 2020-11-03 ENCOUNTER — Encounter: Payer: Self-pay | Admitting: Gastroenterology

## 2020-11-03 ENCOUNTER — Other Ambulatory Visit: Payer: Self-pay

## 2020-11-03 ENCOUNTER — Telehealth: Payer: Self-pay | Admitting: Gastroenterology

## 2020-11-03 VITALS — BP 114/73 | HR 81 | Temp 98.5°F | Ht 63.0 in | Wt 155.1 lb

## 2020-11-03 DIAGNOSIS — K529 Noninfective gastroenteritis and colitis, unspecified: Secondary | ICD-10-CM

## 2020-11-03 DIAGNOSIS — K625 Hemorrhage of anus and rectum: Secondary | ICD-10-CM

## 2020-11-03 IMAGING — RF DG ESOPHAGUS
9 of 10 series · 14 of 24 positions shown · non-contrast
Comparison: Prior CT from 06/10/2018

CLINICAL DATA: History of hiatal hernia and left-sided abdominal
pain

EXAM:
ESOPHOGRAM / BARIUM SWALLOW / BARIUM TABLET STUDY
TECHNIQUE: Combined double contrast and single contrast examination performed
using effervescent crystals, thick barium liquid, and thin barium
liquid. The patient was observed with fluoroscopy swallowing a 13 mm
barium sulphate tablet.
FLUOROSCOPY TIME:  Fluoroscopy Time:  2 minutes 6 seconds
Radiation Exposure Index (if provided by the fluoroscopic device):
46.1 mGy
Number of Acquired Spot Images: 24 spot films, multiple cine
fluoroscopic runs

[Series 1: cp_standard · 0.17mm/px · 2 of 170 frames shown (1 of 4)]
[frame 26/170]
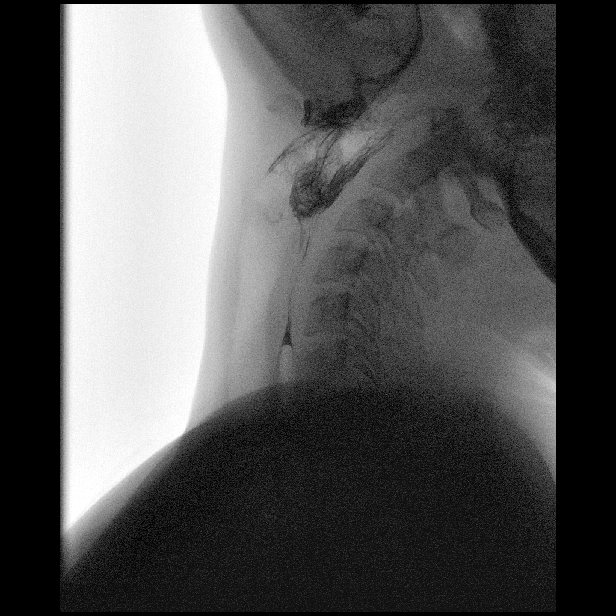
[frame 167/170  full-range]
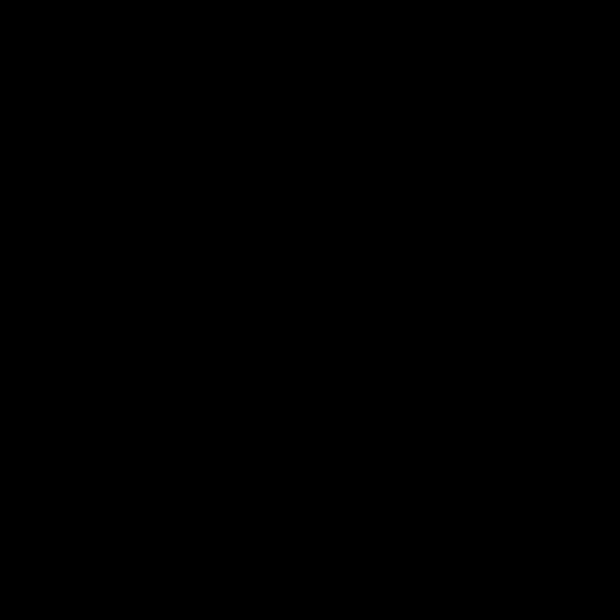

[Series 2: cp_standard · 0.18mm/px · 1 of 135 frames shown (2 of 4)]
[frame 68/135]
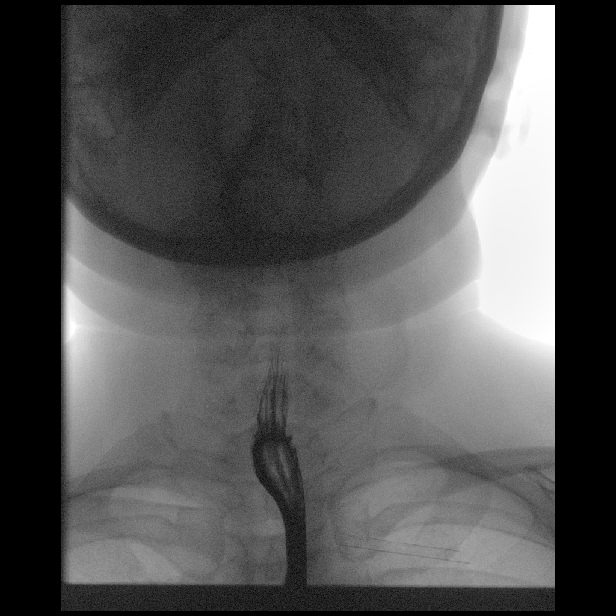

[Series 3: fluoro_barium 2fps_bw · 0.18mm/px · 2 of 4 frames shown (1 of 5)]
[frame 2/4]
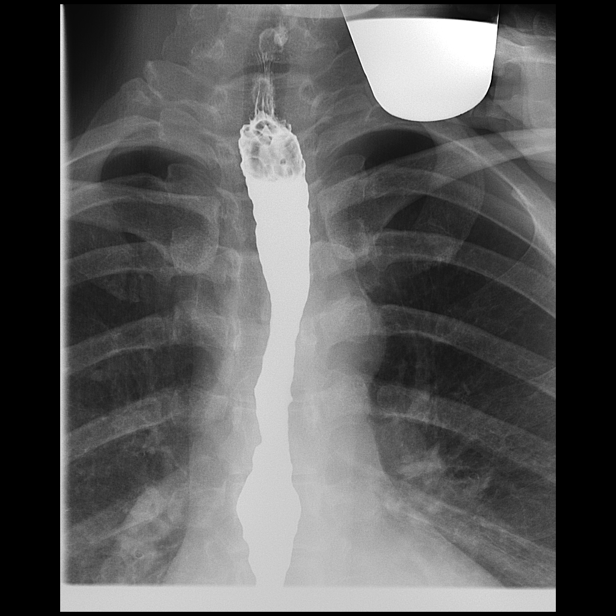
[frame 3/4]
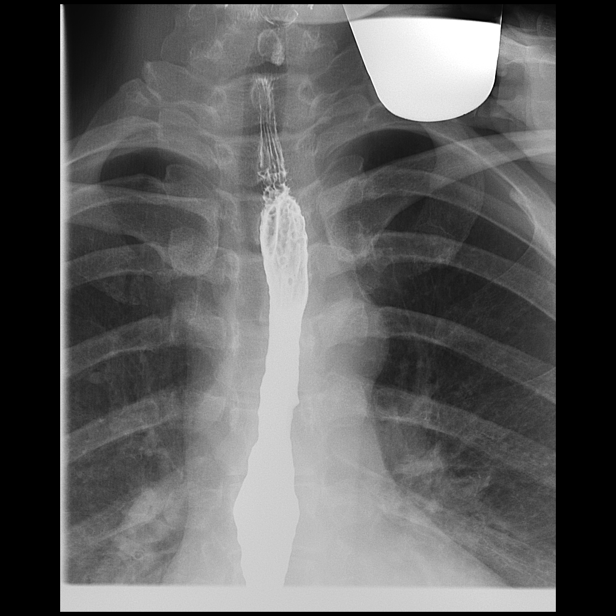

[Series 4: fluoro_barium 2fps_bw · 0.18mm/px · 1 of 7 frames shown (2 of 5)]
[frame 2/7]
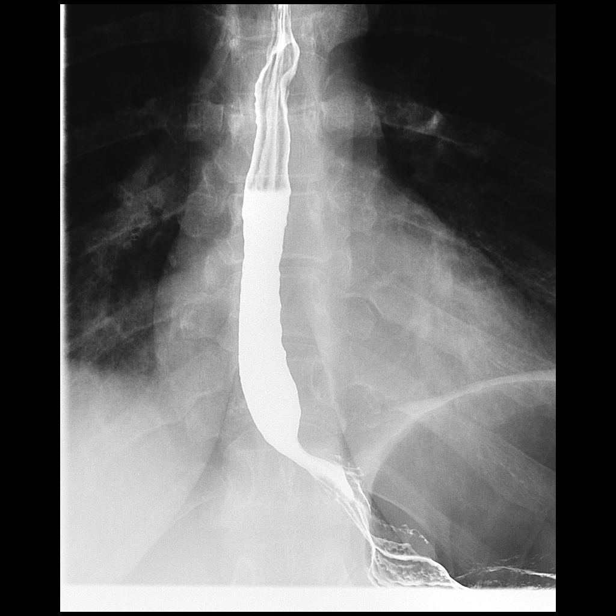

[Series 5: fluoro_barium 2fps_bw · 0.18mm/px · 2 of 5 frames shown (3 of 5)]
[frame 1/5]
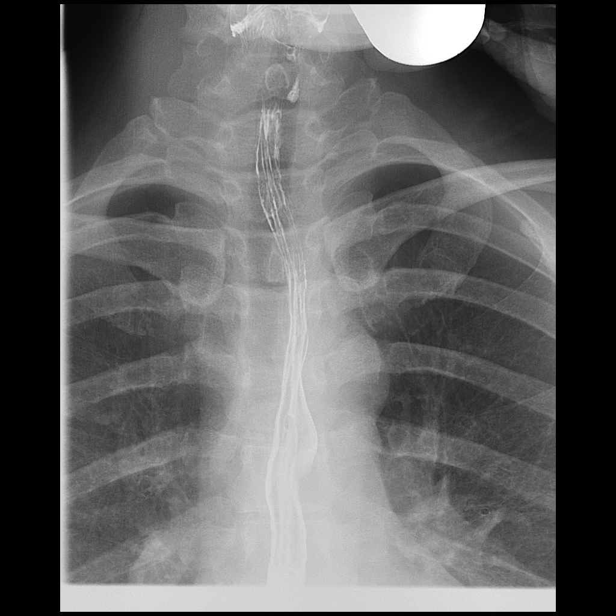
[frame 3/5]
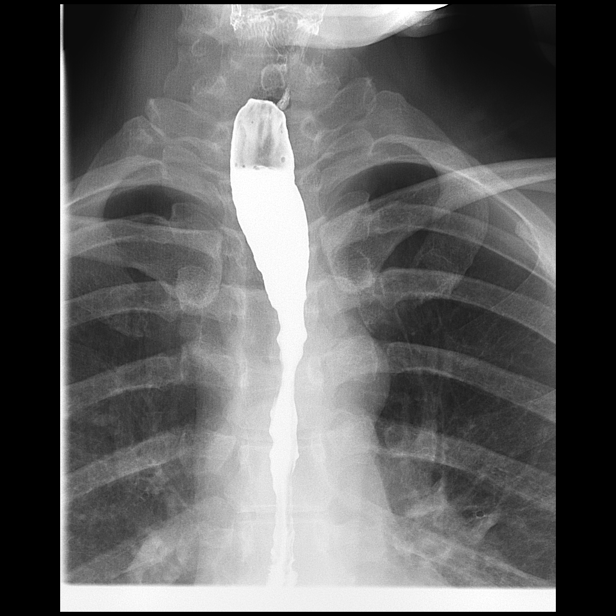

[Series 6: fluoro_barium 2fps_bw · 0.18mm/px · 1 of 5 frames shown (4 of 5)]
[frame 1/5]
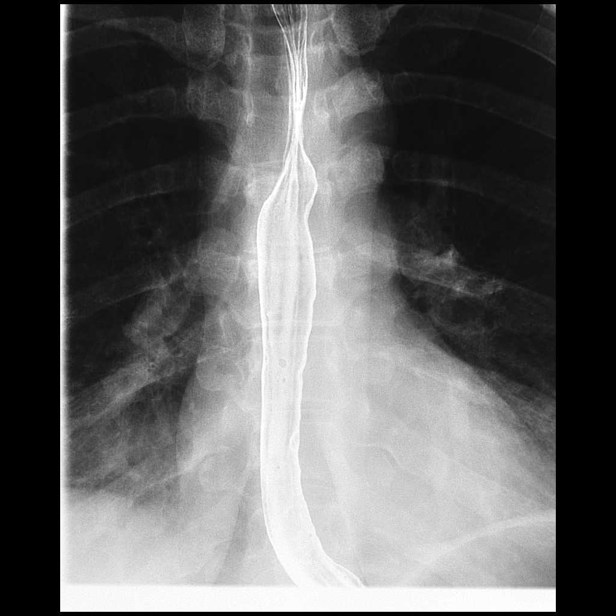

[Series 7: cp_standard · 0.19mm/px · 2 of 132 frames shown (3 of 4)]
[frame 20/132]
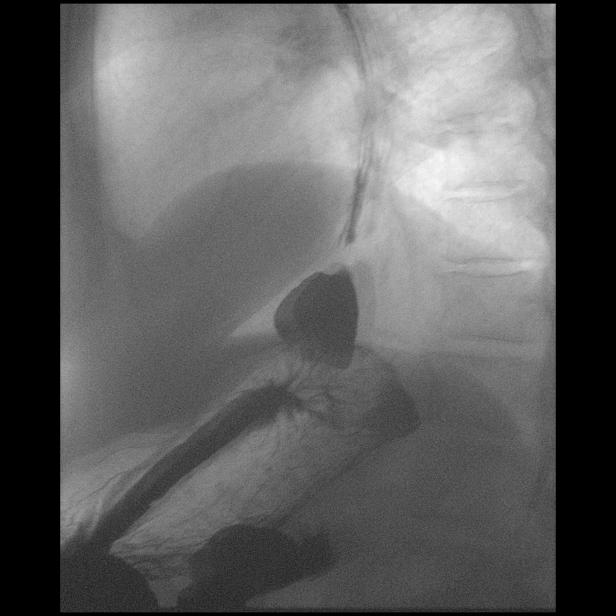
[frame 131/132  full-range]
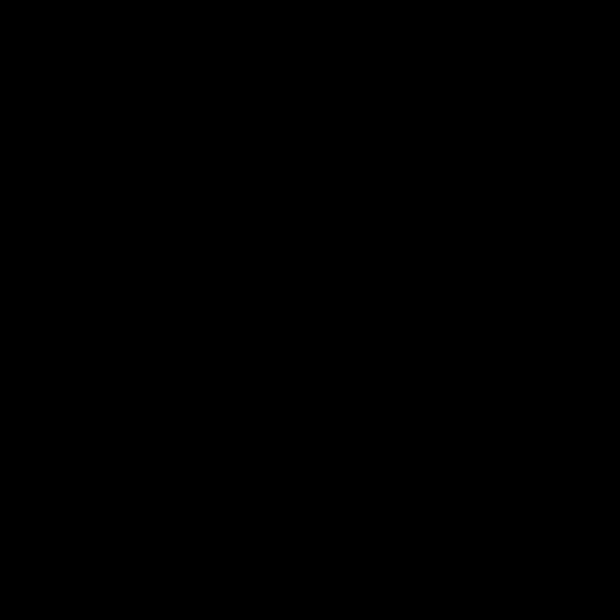

[Series 8: fluoro_barium 2fps_bw · 0.19mm/px · 1 of 2 frames shown (5 of 5)]
[frame 1/2]
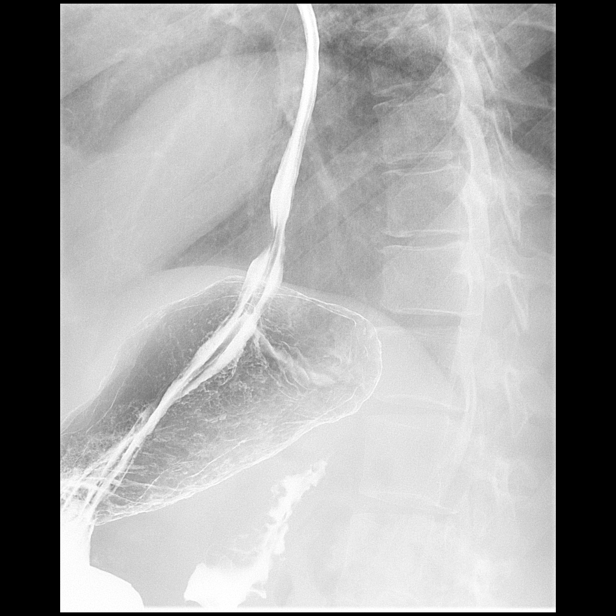

[Series 10: cp_standard · 0.29mm/px · 2 of 245 frames shown (4 of 4)]
[frame 37/245]
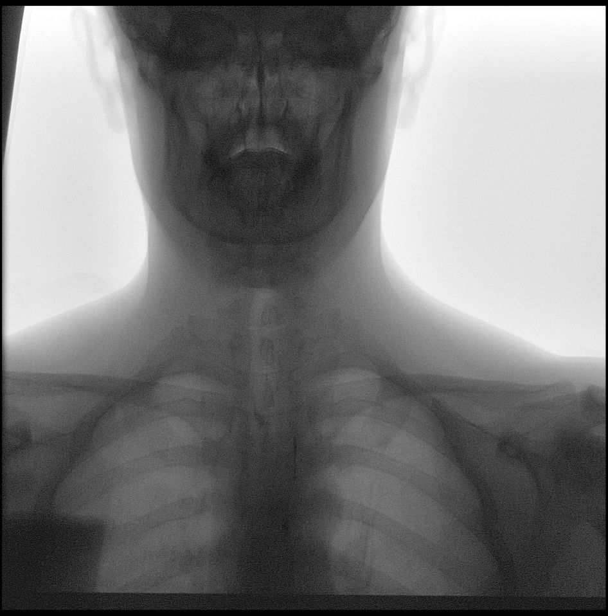
[frame 209/245]
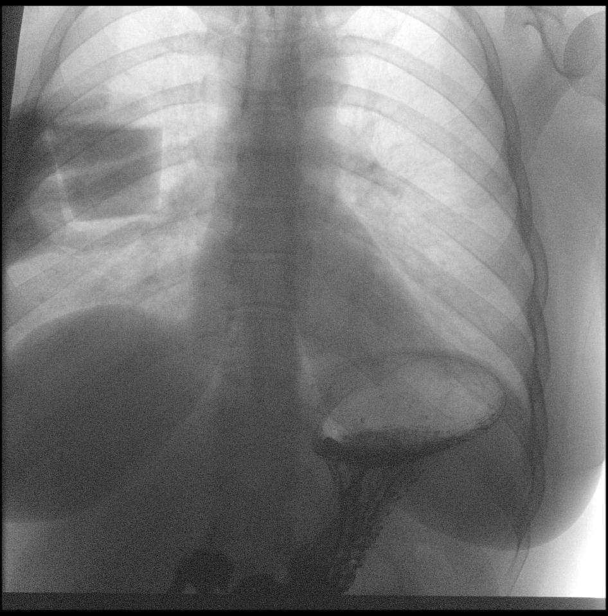

[14 of 24 positions shown; findings below may reference images not displayed]

FINDINGS: Initial swallow of barium demonstrates evidence of significant
laryngeal penetration which reaches the vocal cords. No definite
tracheal aspiration is noted. The laryngeal penetration occurs prior
to inferior deflection of the epiglottis. The esophageal mucosa and
transit time are within normal limits. Barium impregnated tablet
delayed briefly at the gastroesophageal junction prior to passing
into the stomach.

Patient was then placed in the supine positioning and no significant
reflux is noted. When the patient rolls to the right-side-down
decubitus position there is a small sliding-type hiatal hernia
identified which is not seen in the supine position or in the
left-side-down decubitus position. Mild reflux is noted extending
into the mid to distal esophagus.
IMPRESSION: Small sliding-type hiatal hernia is seen only when in the
right-side-down decubitus position. Mild associated distal reflux is
noted also only in the right-side-down decubitus position.

Laryngeal penetration extending to the level of the vocal cords as
described above. No definitive tracheal aspiration is seen.

## 2020-11-03 MED ORDER — HYDROCORTISONE ACETATE 25 MG RE SUPP: 25.0000 mg | Freq: Two times a day (BID) | RECTAL | 0 refills | Status: DC | PRN

## 2020-11-03 MED ORDER — FUSION PLUS PO CAPS: 1.0000 | ORAL_CAPSULE | Freq: Every day | ORAL | 0 refills | Status: DC

## 2020-11-03 MED ORDER — FERROUS SULFATE 325 (65 FE) MG PO TABS: 325.0000 mg | ORAL_TABLET | Freq: Every day | ORAL | 0 refills | Status: AC

## 2020-11-03 NOTE — ED Notes (Signed)
Pt provided discharge instructions and prescription information. Pt was given the opportunity to ask questions and questions were answered. Discharge signature not obtained in the setting of the COVID-19 pandemic in order to reduce high touch surfaces.

## 2020-11-03 NOTE — Telephone Encounter (Signed)
Patient has appointment on 11/30/2020

## 2020-11-03 NOTE — Discharge Instructions (Addendum)
You were seen in the emergency department today with low blood counts and bleeding.  Your hemoglobin today does not require a blood transfusion.  I am starting you on iron.  In review of your last hospitalization the GI doctor thought your bleeding may be coming from hemorrhoids and that you would benefit from seeing a general surgeon.  I have listed their contact information on the paperwork here.  Please call tomorrow for an appointment.  We will start you on suppositories to treat the symptoms.

## 2020-11-03 NOTE — Progress Notes (Signed)

## 2020-11-03 NOTE — Telephone Encounter (Signed)
Made appointment today at 3

## 2020-11-03 NOTE — Progress Notes (Signed)
Cephas Darby, MD [address]  [place]  [place], [place] [address]  Main: [phone]  Fax: [phone] Pager: [phone]   Primary Care Physician: [name], MD  Primary Gastroenterologist:  Dr. Cephas Darby  Chief Complaint  Patient presents with  . Rectal Bleeding    With every bowel movement that fills toliet   . Diarrhea    States 4-6 times a day with blood in stools   . Bloated    After she eats with gas     HPI: Victoria Pierce is a 40 y.o. female with history of chronic refractory GERD, status post Nissen fundoplication, s/p slippage, underwent repeat laparoscopic paraesophageal hernia repair with Toupet fundoplication and PEG tube for gastropexy. Surgery was performed on 08/07/2019.  This was performed by Dr. Cammie Sickle at [facility].  She also has history of symptomatic hemorrhoids, IBS with diarrhea alternating with constipation.    Patient presented to [facility] ER on 2020-01-19 secondary to severe nausea associated with vomiting as well as coughing up blood approximately for 1 week, complaining of rectal bleeding that was dark red.  She reports having regular bowel habits.  Last bowel movement was 6 days ago, reports feeling very gassy and every time she has a bowel movement she expels explosive gas.  She denies abdominal bloating, lower abdominal pain.  She does have left upper quadrant discomfort which she underwent repair of the paraesophageal hernia.  Patient thinks she is actually not coughing but she feels severely nauseous followed by vomiting which she has struggled to let the gastric contents out since the repair of the paraesophageal hernia second time.  She underwent CT abdomen pelvis with contrast in the ER patient not reveal any acute intra-abdominal pathology other than mild proctocolitis.  She does have IUD in place.  Patient also reports lack of appetite and she lost about 34 pounds since October 2020.  Her hemoglobin was normal, CMP and  lipase were normal.  Patient continues to smoke.  She is on Topamax 50 mg twice daily Patient is also concerned about increased spontaneous bruising  Follow-up visit 08/10/2020 Patient is concerned about ongoing abdominal bloating, gas, upper abdominal discomfort associated with postprandial diarrhea about 3-4 times daily.  She has 1 bowel movement as soon as she wakes up in the morning, followed by every time she eats.  She has explosive diarrhea.  She is gradually losing weight, lost about 3 to 4 pounds within last 6 months.  Although, she feels that her weight has stabilized.  She continues to smoke cigarettes, she does admit that she has cut down to half pack per day from more than 1 pack/day.  She is also complaining of rectal bleeding and rectal pain from her hemorrhoids.  She is currently using over-the-counter hemorrhoid creams.  She is also currently taking Amitiza 8 MCG twice daily even though she is having diarrhea, because she reports that if she avoids the medication, results in constipation  Follow-up visit 11/03/2020 Patient made an urgent follow-up visit to see me due to recent rectal bleeding resulting in severe blood loss anemia.  She was admitted to [facility] on 10/26/2020 secondary to 2 weeks history of painless rectal bleeding that resulted in significant drop in hemoglobin from 14.6 on 01/19/2020 to 6.2 on 10/26/2020.  She received blood transfusion and responded appropriately.  Patient underwent nuclear medicine bleeding scan as well as CT abdomen and pelvis with contrast which did not reveal active GI bleeding  other than colonic diverticulosis.  She was recommended to undergo colonoscopy, however patient left AMA.  She went to the ER yesterday as her hemoglobin continued to remain low at 7.5.  She reports that her rectal bleeding has slowed down but still noticing some.  She continues to have ongoing diarrhea associated with abdominal bloating and gas, particularly postprandial.   She has noticed that her symptoms are worse with consumption of lactose.  She is trying to eliminate milk.  Patient continues to smoke cigarettes, however she says she has significantly cut back on it.  Her weight has stabilized.  Patient did not undergo stool studies as recommended.  She says she tried pancreatic enzyme samples that were provided by our office and did not help with diarrhea.  She has not started oral iron replacement yet.   Current Outpatient Medications  Medication Sig Dispense Refill  . budesonide-formoterol (SYMBICORT) 160-4.5 MCG/ACT inhaler Inhale 2 puffs into the lungs in the morning and at bedtime. 1 each 6  . cyclobenzaprine (FLEXERIL) 10 MG tablet Take 10 mg by mouth 3 (three) times daily as needed for muscle spasms.     Marland Kitchen gabapentin (NEURONTIN) 300 MG capsule Take 900 mg by mouth 2 (two) times daily.    . hydrocortisone (ANUSOL-HC) 2.5 % rectal cream Place 1 application rectally 2 (two) times daily. 30 g 0  . Iron-FA-B Cmp-C-Biot-Probiotic (FUSION PLUS) CAPS Take 1 capsule by mouth daily. 30 capsule 0  . lamoTRIgine (LAMICTAL) 200 MG tablet Take 200 mg by mouth daily.    . naproxen (NAPROSYN) 500 MG tablet Take 500 mg by mouth 2 (two) times daily.    . ondansetron (ZOFRAN-ODT) 4 MG disintegrating tablet Take 1 tablet (4 mg total) by mouth every 6 (six) hours as needed for nausea or vomiting. 40 tablet 0  . propranolol (INDERAL) 20 MG tablet Take 20 mg by mouth 4 (four) times daily.    . QUEtiapine (SEROQUEL) 50 MG tablet Take 150 mg by mouth at bedtime.    . topiramate (TOPAMAX) 50 MG tablet Take 50 mg by mouth 2 (two) times daily.    Marland Kitchen VYVANSE 70 MG capsule Take 70 mg by mouth every morning.    . ferrous sulfate 325 (65 FE) MG tablet Take 1 tablet (325 mg total) by mouth daily. (Patient not taking: Reported on 11/03/2020) 30 tablet 0  . hydrocortisone (ANUSOL-HC) 25 MG suppository Place 1 suppository (25 mg total) rectally 2 (two) times daily as needed for hemorrhoids or  anal itching. (Patient not taking: Reported on 11/03/2020) 12 suppository 0   Current Facility-Administered Medications  Medication Dose Route Frequency Provider Last Rate Last Admin  . levonorgestrel (MIRENA) 20 MCG/24HR IUD   Intrauterine Once Homero Fellers, MD        Allergies as of 11/03/2020  . (No Known Allergies)    NSAIDs: None  Antiplts/Anticoagulants/Anti thrombotics: None  GI procedures:  EGD and flexible sigmoidoscopy 02/04/2020 - Normal duodenal bulb and second portion of the duodenum. - Erosive gastropathy with no bleeding and no stigmata of recent bleeding. Biopsied. - A 295 degree fundoplication was found. The wrap appears intact. - Normal gastroesophageal junction and esophagus.  - Diverticulosis in the sigmoid colon and in the descending colon. - Non-bleeding external hemorrhoids, source of rectal bleeding. - No specimens collected.  DIAGNOSIS:  A. STOMACH, RANDOM; COLD BIOPSY:  - GASTRIC ANTRAL AND OXYNTIC MUCOSA WITH FEATURES OF REACTIVE  GASTROPATHY.  - NEGATIVE FOR H. PYLORI, DYSPLASIA, AND MALIGNANCY.  EGD 05/14/2018 - Normal duodenal bulb and second portion of the duodenum. Biopsied. - 4 cm hiatal hernia. - Erythematous mucosa in the gastric body and antrum. Biopsied. - Non-bleeding erosive gastropathy. - Esophagogastric landmarks identified. - Normal esophagus. Biopsied.   Colonoscopy 05/14/2018 - The examined portion of the ileum was normal. - Normal mucosa in the entire examined colon. Biopsied. - Diverticulosis in the sigmoid colon and in the descending colon. - The distal rectum and anal verge are normal on retroflexion view.   DIAGNOSIS:  A. DUODENUM; BIOPSY:  - FEW AREAS OF VILLOUS BLUNTING WITHOUT INCREASED INTRAEPITHELIAL  LYMPHOCYTES.  - SEE COMMENT.   B. STOMACH, RANDOM; BIOPSIES:  - BENIGN GASTRIC MUCOSA WITH FOCAL FIBROSIS IN LAMINA PROPRIA AND  REACTIVE/REGENERATIVE APPEARING GLANDS.  - NEGATIVE FOR ACTIVE MUCOSAL  INFLAMMATION, H. PYLORI, INTESTINAL  METAPLASIA AND DYSPLASIA.  - SEE COMMENT.   C. ESOPHAGUS; BIOPSY:  - NO SIGNIFICANT PATHOLOGIC FEATURES.   D. COLON, RANDOM; BIOPSIES:  - NEGATIVE FOR ACTIVE MUCOSAL INFLAMMATION, GRANULOMAS AND  LYMPHOCYTIC/MICROSCOPIC COLITIS.  - FEATURES OF IDIOPATHIC CHRONIC INFLAMMATORY BOWEL DISEASE (ULCERATIVE  COLITIS) ARE NOT SEEN.  - NO DYSPLASIA OR MALIGNANCY IDENTIFIED.  ROS:  General: Negative for anorexia, weight loss, fever, chills, fatigue, weakness. ENT: Negative for hoarseness, difficulty swallowing , nasal congestion. CV: Negative for chest pain, angina, palpitations, dyspnea on exertion, peripheral edema.  Respiratory: Negative for dyspnea at rest, dyspnea on exertion, cough, sputum, wheezing.  GI: See history of present illness. GU:  Negative for dysuria, hematuria, urinary incontinence, urinary frequency, nocturnal urination.  Endo: Negative for unusual weight change.    Physical Examination:   BP 114/73 (BP Location: Left Arm, Patient Position: Sitting, Cuff Size: Normal)   Pulse 81   Temp 98.5 F (36.9 C) (Oral)   Ht 5' 3"  (1.6 m)   Wt 155 lb 2 oz (70.4 kg)   LMP 11/01/2020   BMI 27.48 kg/m   General: Well-nourished, well-developed in no acute distress.  Eyes: No icterus. Conjunctivae pink. Mouth: Oropharyngeal mucosa moist and pink , no lesions erythema or exudate. Lungs: Clear to auscultation bilaterally. Non-labored. Heart: Regular rate and rhythm, no murmurs rubs or gallops.  Abdomen: Bowel sounds are normal, nontender, nondistended, no hepatosplenomegaly or masses, no hernia , no rebound or guarding.   Rectal exam: Normal perianal exam, nontender digital rectal exam, palpable large external and internal hemorrhoids, also confirmed on anoscopy Extremities: No lower extremity edema. No clubbing or deformities. Neuro: Alert and oriented x 3.  Grossly intact. Skin: Warm and dry, no jaundice, multiple areas of bruises in upper  and lower extremities.   Psych: Alert and cooperative, normal mood and affect.   Imaging Studies: Reviewed  Assessment and Plan:   VELVIA MEHRER is a 40 y.o. female with history of bipolar, chronic GERD refractory to PPI status post Nissen fundoplication with repair of hiatal hernia s/p slippage, s/p repair is seen urgently for follow-up of recent episode of painless, severe rectal bleeding that lasted for 2 weeks resulting in severe anemia.  She continues to have ongoing abdominal bloating, postprandial explosive diarrhea  Rectal bleeding from internal and external hemorrhoids Patient underwent colonoscopy as well as flexible sigmoidoscopy within last 2 years.  Other than left-sided diverticulosis, there was no evidence of IBD, microscopic colitis.  Since her rectal bleeding has persisted for 2 weeks and still ongoing, very less likely diverticular bleed.  I have discussed with her about hemorrhoid ligation including risks and benefits.  She is willing to undergo  today.  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  Celiac serologies negative, normal IgA levels.  Random colon biopsies were unremarkable, normal TI. No evidence of H. Pylori Patient has empirically tried pancreatic enzymes, no relief of symptoms Discussed about strict lactose-free diet for 2 weeks Advised to try lactobacillus probiotics 2-3 times daily Also, recommend to check stool studies to rule out infection If symptoms are persistent, will treat for possible bacterial  overgrowth Highly encouraged to continue to work on quitting smoking Advised patient to message me via MyChart  Patient reports that her Medicaid will expire end of September 2022   Follow up in 2 weeks  Dr Sherri Sear, MD

## 2020-11-03 NOTE — ED Provider Notes (Signed)
Emergency Department Provider Note   I have reviewed the triage vital signs and the nursing notes.   HISTORY  Chief Complaint Abnormal Lab and Rectal Bleeding   HPI Victoria Pierce is a 40 y.o. female with past medical history reviewed below returns to the emergency department after consulting with her primary care doctor.  She left AGAINST MEDICAL ADVICE from the hospital on 7/13 after being admitted with GI bleeding and symptomatic anemia.  She was transfused 1 unit PRBC during that admission and seen by gastroenterology.  In review of the chart gastroenterology recommended a nuclear medicine bleed scan which was done and showed no acute bleeding.  They did not recommend repeat colonoscopy as the patient has had several studies in the past 2 years.  They suspected symptomatic hemorrhoids and recommended general surgery follow-up.  Patient states that she ultimately left AGAINST MEDICAL ADVICE because her psychiatry medications were delayed she began to have anxiety and panic symptoms.  She does not recall this being part of the discussion at discharge although admits that she was very anxious and having a panic attack.  After discussion with her PCP she was advised to return to the emergency department for evaluation.  She does continue to have intermittent rectal bleeding which she described as bright red.  She notes some cramping abdominal pain which is mostly chronic for her.  She is continuing to have some fatigue and lightheadedness symptoms.   Past Medical History:  Diagnosis Date   Anxiety    Asthma    Bipolar 1 disorder ([place])    Depression    Diverticulitis    GERD (gastroesophageal reflux disease)    Hernia, abdominal    Hiatal hernia    Ulcerative colitis    Vaginal septum     Patient Active Problem List   Diagnosis Date Noted   Syncope 10/27/2020   Diverticulosis 10/27/2020   Asthma 10/27/2020   Symptomatic anemia 10/26/2020   Shortness of breath 06/18/2020    Chest pain of uncertain etiology 78/29/5621   Left breast lump 06/18/2020   Non-intractable vomiting    Rectal bleeding    Tobacco use disorder 08/11/2019   S/P repair of paraesophageal hernia 08/07/2019   History of Nissen fundoplication 30/86/5784   Chronic pelvic pain in female 06/14/2018   Dysmenorrhea 06/14/2018   Menorrhagia with regular cycle 06/14/2018   Contracture of finger joint, 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   Closed displaced fracture of neck of fifth metacarpal bone of left hand 03/07/2018   Chronic GERD    Anxiety     Past Surgical History:  Procedure Laterality Date   CLOSED REDUCTION METACARPAL WITH PERCUTANEOUS PINNING Left 03/07/2018   Procedure: CLOSED REDUCTION METACARPAL WITH PERCUTANEOUS PINNING-5th metacarpal;  Surgeon: Corky Mull, MD;  Location: [facility] ORS;  Service: Orthopedics;  Laterality: Left;   COLONOSCOPY WITH PROPOFOL N/A 05/14/2018   Procedure: COLONOSCOPY WITH PROPOFOL;  Surgeon: Lin Landsman, MD;  Location: [facility] ENDOSCOPY;  Service: Gastroenterology;  Laterality: N/A;   ESOPHAGOGASTRODUODENOSCOPY (EGD) WITH PROPOFOL N/A 05/14/2018   Procedure: ESOPHAGOGASTRODUODENOSCOPY (EGD) WITH PROPOFOL;  Surgeon: Lin Landsman, MD;  Location: [facility] ENDOSCOPY;  Service: Gastroenterology;  Laterality: N/A;   ESOPHAGOGASTRODUODENOSCOPY (EGD) WITH PROPOFOL N/A 03/06/2019   Procedure: ESOPHAGOGASTRODUODENOSCOPY (EGD) WITH PROPOFOL;  Surgeon: Lin Landsman, MD;  Location: [facility] ENDOSCOPY;  Service: Gastroenterology;  Laterality: N/A;   ESOPHAGOGASTRODUODENOSCOPY (EGD) WITH PROPOFOL N/A 02/04/2020   Procedure: ESOPHAGOGASTRODUODENOSCOPY (EGD) WITH PROPOFOL;  Surgeon: Lin Landsman, MD;  Location: [place];  Service: Gastroenterology;  Laterality: N/A;   FLEXIBLE SIGMOIDOSCOPY N/A 02/04/2020   Procedure: FLEXIBLE SIGMOIDOSCOPY;  Surgeon: Lin Landsman, MD;  Location: [facility] ENDOSCOPY;  Service: Gastroenterology;  Laterality: N/A;   FRACTURE SURGERY      HARDWARE REMOVAL Left 04/03/2018   Procedure: LEFT HAND FIFTH METACARPAL PIN REMOVAL;  Surgeon: Corky Mull, MD;  Location: [place];  Service: Orthopedics;  Laterality: Left;   HERNIA REPAIR     ROBOTIC ASSISTED LAPAROSCOPIC NISSEN FUNDOPLICATION N/A 9/41/7408   Procedure: ROBOTIC ASSISTED LAPAROSCOPIC NISSEN FUNDOPLICATION;  Surgeon: Jules Husbands, MD;  Location: [facility] ORS;  Service: General;  Laterality: N/A;   TRIGGER FINGER RELEASE Left 06/19/2018   Procedure: EXTENSOR TENOLYSIS WITH CAPSULAR RELEASE OF LEFT LITTLE MCP JOINT;  Surgeon: Corky Mull, MD;  Location: [place];  Service: Orthopedics;  Laterality: Left;   TUBAL LIGATION      Allergies Patient has no known allergies.  Family History  Problem Relation Age of Onset   Hypertension Sister    Hyperlipidemia Mother    Hypertension Mother    Hypertension Father    Hyperlipidemia Father     Social History Social History   Tobacco Use   Smoking status: Every Day    Packs/day: 1.50    Years: 27.00    Pack years: 40.50    Types: Cigarettes   Smokeless tobacco: Never   Tobacco comments:    0.5ppd 08/19/2020  Vaping Use   Vaping Use: Never used  Substance Use Topics   Alcohol use: No   Drug use: Yes    Types: Marijuana    Comment: used Monday    Review of Systems  Constitutional: No fever/chills. Positive fatigue and lightheadedness.  Eyes: No visual changes. ENT: No sore throat. Cardiovascular: Denies chest pain. Respiratory: Denies shortness of breath. Gastrointestinal: Cramping abdominal pain.  No nausea, no vomiting.  No diarrhea.  No constipation. Rectal bleeding.  Genitourinary: Negative for dysuria. Musculoskeletal: Negative for back pain. Skin: Negative for rash. Neurological: Negative for headaches, focal weakness or numbness.  10-point ROS otherwise negative.  ____________________________________________   PHYSICAL EXAM:  VITAL SIGNS: ED Triage Vitals  Enc Vitals Group      BP 11/02/20 2215 115/74     Pulse Rate 11/02/20 2215 (!) 101     Resp 11/02/20 2215 17     Temp 11/02/20 2216 99.1 F (37.3 C)     Temp src --      SpO2 11/02/20 2215 100 %   Constitutional: Alert and oriented. Well appearing and in no acute distress. Eyes: Conjunctivae are normal.  Head: Atraumatic. Nose: No congestion/rhinnorhea. Mouth/Throat: Mucous membranes are moist.  Neck: No stridor.  Cardiovascular: Normal rate, regular rhythm. Good peripheral circulation. Grossly normal heart sounds.   Respiratory: Normal respiratory effort.  No retractions. Lungs CTAB. Gastrointestinal: Soft and nontender. No distention.  Musculoskeletal: No lower extremity tenderness nor edema. No gross deformities of extremities. Neurologic:  Normal speech and language. No gross focal neurologic deficits are appreciated.  Skin:  Skin is warm, dry and intact. No rash noted.  ____________________________________________   LABS (all labs ordered are listed, but only abnormal results are displayed)  Labs Reviewed  COMPREHENSIVE METABOLIC PANEL - Abnormal; Notable for the following components:      Result Value   Glucose, Bld 159 (*)    Calcium 8.7 (*)    AST 9 (*)    All other components within normal limits  CBC - Abnormal; Notable for the  following components:   RBC 2.75 (*)    Hemoglobin 7.5 (*)    HCT 24.6 (*)    All other components within normal limits    ____________________________________________  RADIOLOGY  None   ____________________________________________   PROCEDURES  Procedure(s) performed:   Procedures  None  ____________________________________________   INITIAL IMPRESSION / ASSESSMENT AND PLAN / ED COURSE  Pertinent labs & imaging results that were available during my care of the patient were reviewed by me and considered in my medical decision making (see chart for details).   Patient presents to the emergency department with concern for continued  symptomatic anemia and rectal bleeding.  I reviewed her discharge summary and GI consultation notes from her recent admission.  Her blood counts here are stable with hemoglobin of 7.5.  She does not require an emergent blood transfusion.  GI recommends outpatient general surgery follow-up.  We discussed this recommendation I will, labs, follow-up plan.  Patient is in agreement to restart iron supplementation and will start Anusol suppositories.  I provided contact information for general surgery.  Patient plans to call tomorrow.   ____________________________________________  FINAL CLINICAL IMPRESSION(S) / ED DIAGNOSES  Final diagnoses:  Anemia, unspecified type    NEW OUTPATIENT MEDICATIONS STARTED DURING THIS VISIT:  Discharge Medication List as of 11/03/2020 12:15 AM     START taking these medications   Details  ferrous sulfate 325 (65 FE) MG tablet Take 1 tablet (325 mg total) by mouth daily., Starting Wed 11/03/2020, Normal    hydrocortisone (ANUSOL-HC) 25 MG suppository Place 1 suppository (25 mg total) rectally 2 (two) times daily as needed for hemorrhoids or anal itching., Starting Wed 11/03/2020, Normal        Note:  This document was prepared using Dragon voice recognition software and may include unintentional dictation errors.  Nanda Quinton, MD, [facility] Emergency Medicine    Jeniya Flannigan, Wonda Olds, MD 11/03/20 0300

## 2020-11-03 NOTE — Telephone Encounter (Signed)
Patient has gone to the ED several times and a overnight stay for rectal bleeding. Her hemoglobin is very low. What can she do, please advise.

## 2020-11-04 ENCOUNTER — Encounter: Payer: Self-pay | Admitting: Gastroenterology

## 2020-11-04 LAB — CBC
Hematocrit: 24.7 % — ABNORMAL LOW (ref 34.0–46.6)
Hemoglobin: 7.6 g/dL — ABNORMAL LOW (ref 11.1–15.9)
MCH: 26.8 pg (ref 26.6–33.0)
MCHC: 30.8 g/dL — ABNORMAL LOW (ref 31.5–35.7)
MCV: 87 fL (ref 79–97)
Platelets: 321 10*3/uL (ref 150–450)
RBC: 2.84 x10E6/uL — ABNORMAL LOW (ref 3.77–5.28)
RDW: 15 % (ref 11.7–15.4)
WBC: 10.4 10*3/uL (ref 3.4–10.8)

## 2020-11-04 LAB — IRON,TIBC AND FERRITIN PANEL
Ferritin: 4 ng/mL — ABNORMAL LOW (ref 15–150)
Iron Saturation: 3 % — CL (ref 15–55)
Iron: 12 ug/dL — ABNORMAL LOW (ref 27–159)
Total Iron Binding Capacity: 428 ug/dL (ref 250–450)
UIBC: 416 ug/dL (ref 131–425)

## 2020-11-04 LAB — B12 AND FOLATE PANEL
Folate: 8.3 ng/mL (ref >3.0)
Vitamin B-12: 876 pg/mL (ref 232–1245)

## 2020-11-05 ENCOUNTER — Telehealth: Payer: Self-pay

## 2020-11-05 NOTE — Telephone Encounter (Signed)
Patient states when she did the first banding procedure. She states she has not had any rectal bleeding. She does have a stinging pain with the bowel movement.

## 2020-11-05 NOTE — Telephone Encounter (Signed)
Called and left a message for call back  

## 2020-11-05 NOTE — Telephone Encounter (Signed)
-----   Message from Lin Landsman, MD sent at 11/04/2020  4:28 PM EDT ----- Please call patient and check on her if she is still having rectal bleeding  Thanks RV

## 2020-11-08 ENCOUNTER — Telehealth: Payer: Self-pay | Admitting: Gastroenterology

## 2020-11-08 NOTE — Telephone Encounter (Signed)
Having pain after banding.

## 2020-11-08 NOTE — Telephone Encounter (Signed)
Patient states she is feeling rectal pain and feels like she is tearing in her rectum. She states she started on Friday. Denies any bleeding. States it uncomfortable and she states she has tried the Anusol cream but she states what comes with the cream is a hard plastic piece and she can not insert that in side of her. She states she has tried a Qtip but does not feel like it gets inside her rectum.

## 2020-11-09 ENCOUNTER — Emergency Department (HOSPITAL_BASED_OUTPATIENT_CLINIC_OR_DEPARTMENT_OTHER): Payer: Medicaid Other

## 2020-11-09 ENCOUNTER — Other Ambulatory Visit: Payer: Self-pay

## 2020-11-09 ENCOUNTER — Inpatient Hospital Stay (HOSPITAL_BASED_OUTPATIENT_CLINIC_OR_DEPARTMENT_OTHER)
Admission: EM | Admit: 2020-11-09 | Discharge: 2020-11-13 | DRG: 386 | Disposition: A | Discharge status: Home / Self Care | Payer: Medicaid Other | Attending: Internal Medicine | Admitting: Internal Medicine

## 2020-11-09 ENCOUNTER — Encounter (HOSPITAL_BASED_OUTPATIENT_CLINIC_OR_DEPARTMENT_OTHER): Payer: Self-pay

## 2020-11-09 DIAGNOSIS — Z20822 Contact with and (suspected) exposure to covid-19: Secondary | ICD-10-CM | POA: Diagnosis present

## 2020-11-09 DIAGNOSIS — G43909 Migraine, unspecified, not intractable, without status migrainosus: Secondary | ICD-10-CM | POA: Diagnosis present

## 2020-11-09 DIAGNOSIS — K5732 Diverticulitis of large intestine without perforation or abscess without bleeding: Secondary | ICD-10-CM | POA: Diagnosis present

## 2020-11-09 DIAGNOSIS — D649 Anemia, unspecified: Secondary | ICD-10-CM | POA: Diagnosis present

## 2020-11-09 DIAGNOSIS — K51911 Ulcerative colitis, unspecified with rectal bleeding: Principal | ICD-10-CM | POA: Diagnosis present

## 2020-11-09 DIAGNOSIS — F17200 Nicotine dependence, unspecified, uncomplicated: Secondary | ICD-10-CM | POA: Diagnosis present

## 2020-11-09 DIAGNOSIS — Z7951 Long term (current) use of inhaled steroids: Secondary | ICD-10-CM

## 2020-11-09 DIAGNOSIS — K219 Gastro-esophageal reflux disease without esophagitis: Secondary | ICD-10-CM | POA: Diagnosis present

## 2020-11-09 DIAGNOSIS — K922 Gastrointestinal hemorrhage, unspecified: Secondary | ICD-10-CM | POA: Diagnosis present

## 2020-11-09 DIAGNOSIS — F319 Bipolar disorder, unspecified: Secondary | ICD-10-CM | POA: Diagnosis present

## 2020-11-09 DIAGNOSIS — E876 Hypokalemia: Secondary | ICD-10-CM | POA: Diagnosis present

## 2020-11-09 DIAGNOSIS — R55 Syncope and collapse: Secondary | ICD-10-CM | POA: Diagnosis present

## 2020-11-09 DIAGNOSIS — D509 Iron deficiency anemia, unspecified: Secondary | ICD-10-CM | POA: Diagnosis present

## 2020-11-09 DIAGNOSIS — F17210 Nicotine dependence, cigarettes, uncomplicated: Secondary | ICD-10-CM | POA: Diagnosis present

## 2020-11-09 DIAGNOSIS — F419 Anxiety disorder, unspecified: Secondary | ICD-10-CM | POA: Diagnosis present

## 2020-11-09 DIAGNOSIS — J45909 Unspecified asthma, uncomplicated: Secondary | ICD-10-CM | POA: Diagnosis present

## 2020-11-09 DIAGNOSIS — Z79899 Other long term (current) drug therapy: Secondary | ICD-10-CM

## 2020-11-09 DIAGNOSIS — D62 Acute posthemorrhagic anemia: Secondary | ICD-10-CM | POA: Diagnosis present

## 2020-11-09 LAB — BASIC METABOLIC PANEL
Anion gap: 8 (ref 5–15)
BUN: 13 mg/dL (ref 6–20)
CO2: 22 mmol/L (ref 22–32)
Calcium: 8.8 mg/dL — ABNORMAL LOW (ref 8.9–10.3)
Chloride: 110 mmol/L (ref 98–111)
Creatinine, Ser: 1.08 mg/dL — ABNORMAL HIGH (ref 0.44–1.00)
GFR, Estimated: >60 mL/min (ref >60)
Glucose, Bld: 76 mg/dL (ref 70–99)
Potassium: 3.4 mmol/L — ABNORMAL LOW (ref 3.5–5.1)
Sodium: 140 mmol/L (ref 135–145)

## 2020-11-09 LAB — PREGNANCY, URINE: Preg Test, Ur: NEGATIVE

## 2020-11-09 LAB — CBC
HCT: 22.6 % — ABNORMAL LOW (ref 36.0–46.0)
Hemoglobin: 6.9 g/dL — CL (ref 12.0–15.0)
MCH: 27 pg (ref 26.0–34.0)
MCHC: 30.5 g/dL (ref 30.0–36.0)
MCV: 88.3 fL (ref 80.0–100.0)
Platelets: 363 10*3/uL (ref 150–400)
RBC: 2.56 MIL/uL — ABNORMAL LOW (ref 3.87–5.11)
RDW: 16.5 % — ABNORMAL HIGH (ref 11.5–15.5)
WBC: 11.2 10*3/uL — ABNORMAL HIGH (ref 4.0–10.5)
nRBC: 0 % (ref 0.0–0.2)

## 2020-11-09 LAB — URINALYSIS, ROUTINE W REFLEX MICROSCOPIC
Bilirubin Urine: NEGATIVE
Glucose, UA: NEGATIVE mg/dL
Hgb urine dipstick: NEGATIVE
Leukocytes,Ua: NEGATIVE
Nitrite: NEGATIVE
Protein, ur: 30 mg/dL — AB
Specific Gravity, Urine: 1.022 (ref 1.005–1.030)
pH: 6.5 (ref 5.0–8.0)

## 2020-11-09 NOTE — ED Provider Notes (Signed)
[facility] EMERGENCY DEPT Provider Note   CSN: [id number] Arrival date & time: 11/09/20  2135     History Chief Complaint  Patient presents with   Loss of Consciousness    Victoria Pierce is a 40 y.o. female.  The history is provided by the patient.  Loss of Consciousness She has history of asthma, GERD, ulcerative colitis, GI bleeding and comes in after syncopal episode.  She was in a laundromat and getting something out of the dryer when she started to feel lightheaded and passed out.  She did hit her head on the floor.  Loss of consciousness was only a few seconds per her husband.  She is complaining of an ongoing headache.  She was recently in the hospital for GI bleed and received a blood transfusion.  As an outpatient, she saw a gastroenterologist who did banding on a bleeding hemorrhoid, and she has not noticed any rectal bleeding since the banding procedure.   Past Medical History:  Diagnosis Date   Anxiety    Asthma    Bipolar 1 disorder ([place])    Depression    Diverticulitis    GERD (gastroesophageal reflux disease)    Hernia, abdominal    Hiatal hernia    Ulcerative colitis    Vaginal septum     Patient Active Problem List   Diagnosis Date Noted   Syncope 10/27/2020   Diverticulosis 10/27/2020   Asthma 10/27/2020   Symptomatic anemia 10/26/2020   Shortness of breath 06/18/2020   Chest pain of uncertain etiology 79/89/2119   Left breast lump 06/18/2020   Non-intractable vomiting    Rectal bleeding    Tobacco use disorder 08/11/2019   S/P repair of paraesophageal hernia 08/07/2019   History of Nissen fundoplication 41/74/0814   Chronic pelvic pain in female 06/14/2018   Dysmenorrhea 06/14/2018   Menorrhagia with regular cycle 06/14/2018   Contracture of finger joint, 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   Closed displaced fracture of neck of fifth metacarpal bone of left hand 03/07/2018   Chronic GERD    Anxiety     Past Surgical History:  Procedure  Laterality Date   CLOSED REDUCTION METACARPAL WITH PERCUTANEOUS PINNING Left 03/07/2018   Procedure: CLOSED REDUCTION METACARPAL WITH PERCUTANEOUS PINNING-5th metacarpal;  Surgeon: Corky Mull, MD;  Location: [facility] ORS;  Service: Orthopedics;  Laterality: Left;   COLONOSCOPY WITH PROPOFOL N/A 05/14/2018   Procedure: COLONOSCOPY WITH PROPOFOL;  Surgeon: Lin Landsman, MD;  Location: [facility] ENDOSCOPY;  Service: Gastroenterology;  Laterality: N/A;   ESOPHAGOGASTRODUODENOSCOPY (EGD) WITH PROPOFOL N/A 05/14/2018   Procedure: ESOPHAGOGASTRODUODENOSCOPY (EGD) WITH PROPOFOL;  Surgeon: Lin Landsman, MD;  Location: [facility] ENDOSCOPY;  Service: Gastroenterology;  Laterality: N/A;   ESOPHAGOGASTRODUODENOSCOPY (EGD) WITH PROPOFOL N/A 03/06/2019   Procedure: ESOPHAGOGASTRODUODENOSCOPY (EGD) WITH PROPOFOL;  Surgeon: Lin Landsman, MD;  Location: [facility] ENDOSCOPY;  Service: Gastroenterology;  Laterality: N/A;   ESOPHAGOGASTRODUODENOSCOPY (EGD) WITH PROPOFOL N/A 02/04/2020   Procedure: ESOPHAGOGASTRODUODENOSCOPY (EGD) WITH PROPOFOL;  Surgeon: Lin Landsman, MD;  Location: [facility] ENDOSCOPY;  Service: Gastroenterology;  Laterality: N/A;   FLEXIBLE SIGMOIDOSCOPY N/A 02/04/2020   Procedure: FLEXIBLE SIGMOIDOSCOPY;  Surgeon: Lin Landsman, MD;  Location: [facility] ENDOSCOPY;  Service: Gastroenterology;  Laterality: N/A;   FRACTURE SURGERY     HARDWARE REMOVAL Left 04/03/2018   Procedure: LEFT HAND FIFTH METACARPAL PIN REMOVAL;  Surgeon: Corky Mull, MD;  Location: [place];  Service: Orthopedics;  Laterality: Left;   HERNIA REPAIR     ROBOTIC ASSISTED LAPAROSCOPIC NISSEN  FUNDOPLICATION N/A 9/93/5701   Procedure: ROBOTIC ASSISTED LAPAROSCOPIC NISSEN FUNDOPLICATION;  Surgeon: Jules Husbands, MD;  Location: [facility] ORS;  Service: General;  Laterality: N/A;   TRIGGER FINGER RELEASE Left 06/19/2018   Procedure: EXTENSOR TENOLYSIS WITH CAPSULAR RELEASE OF LEFT LITTLE MCP JOINT;  Surgeon: Corky Mull, MD;  Location: [place];  Service: Orthopedics;  Laterality: Left;   TUBAL LIGATION       OB History     Gravida  2   Para  2   Term  2   Preterm      AB      Living  2      SAB      IAB      Ectopic      Multiple      Live Births  2           Family History  Problem Relation Age of Onset   Hypertension Sister    Hyperlipidemia Mother    Hypertension Mother    Hypertension Father    Hyperlipidemia Father     Social History   Tobacco Use   Smoking status: Every Day    Packs/day: 1.00    Years: 27.00    Pack years: 27.00    Types: Cigarettes   Smokeless tobacco: Never   Tobacco comments:    0.5ppd 08/19/2020  Vaping Use   Vaping Use: Never used  Substance Use Topics   Alcohol use: No   Drug use: Yes    Types: Marijuana    Comment: used Monday    Home Medications Prior to Admission medications   Medication Sig Start Date End Date Taking? Authorizing Provider  budesonide-formoterol (SYMBICORT) 160-4.5 MCG/ACT inhaler Inhale 2 puffs into the lungs in the morning and at bedtime. 08/19/20   Chesley Mires, MD  cyclobenzaprine (FLEXERIL) 10 MG tablet Take 10 mg by mouth 3 (three) times daily as needed for muscle spasms.     [provider]  ferrous sulfate 325 (65 FE) MG tablet Take 1 tablet (325 mg total) by mouth daily. Patient not taking: Reported on 11/03/2020 11/03/20   Margette Fast, MD  gabapentin (NEURONTIN) 300 MG capsule Take 900 mg by mouth 2 (two) times daily. 10/08/20   [provider]  hydrocortisone (ANUSOL-HC) 2.5 % rectal cream Place 1 application rectally 2 (two) times daily. 08/10/20   Lin Landsman, MD  hydrocortisone (ANUSOL-HC) 25 MG suppository Place 1 suppository (25 mg total) rectally 2 (two) times daily as needed for hemorrhoids or anal itching. Patient not taking: Reported on 11/03/2020 11/03/20   Margette Fast, MD  Iron-FA-B Cmp-C-Biot-Probiotic (FUSION PLUS) CAPS Take 1 capsule by mouth daily.  11/03/20   Lin Landsman, MD  lamoTRIgine (LAMICTAL) 200 MG tablet Take 200 mg by mouth daily. 11/01/20   [provider]  naproxen (NAPROSYN) 500 MG tablet Take 500 mg by mouth 2 (two) times daily. 02/14/20   [provider]  ondansetron (ZOFRAN-ODT) 4 MG disintegrating tablet Take 1 tablet (4 mg total) by mouth every 6 (six) hours as needed for nausea or vomiting. 09/27/18   Tylene Fantasia, PA-C  propranolol (INDERAL) 20 MG tablet Take 20 mg by mouth 4 (four) times daily. 06/16/19   [provider]  QUEtiapine (SEROQUEL) 50 MG tablet Take 150 mg by mouth at bedtime. 05/25/20   [provider]  topiramate (TOPAMAX) 50 MG tablet Take 50 mg by mouth 2 (two) times daily.  [provider]  VYVANSE 70 MG capsule Take 70 mg by mouth every morning. 11/01/20   [provider]  albuterol (VENTOLIN HFA) 108 (90 Base) MCG/ACT inhaler Inhale 2 puffs into the lungs every 6 (six) hours as needed for wheezing or shortness of breath. Patient not taking: No sig reported 08/19/20 10/27/20  Chesley Mires, MD    Allergies    Patient has no known allergies.  Review of Systems   Review of Systems  Cardiovascular:  Positive for syncope.  All other systems reviewed and are negative.  Physical Exam Updated Vital Signs BP 119/72 (BP Location: Right Arm)   Pulse 100   Temp 98.4 F (36.9 C) (Oral)   Resp 16   Ht 5' 3"  (1.6 m)   Wt 68 kg   LMP 11/01/2020   SpO2 100%   BMI 26.57 kg/m   Physical Exam Vitals and nursing note reviewed.  40 year old female, resting comfortably and in no acute distress. Vital signs are normal. Oxygen saturation is 100%, which is normal. Head is normocephalic and atraumatic. PERRLA, EOMI. Oropharynx is clear. Neck is nontender without adenopathy or JVD. Back is nontender and there is no CVA tenderness. Lungs are clear without rales, wheezes, or rhonchi. Chest is nontender. Heart has regular rate and rhythm without  murmur. Abdomen is soft, flat, nontender without masses or hepatosplenomegaly and peristalsis is normoactive. Extremities have no cyanosis or edema, full range of motion is present.  Ecchymosis is noted on the thenar eminence of the left hand. Skin is warm and dry without rash. Neurologic: Mental status is normal, cranial nerves are intact, there are no motor or sensory deficits.  ED Results / Procedures / Treatments   Labs (all labs ordered are listed, but only abnormal results are displayed) Labs Reviewed  BASIC METABOLIC PANEL - Abnormal; Notable for the following components:      Result Value   Potassium 3.4 (*)    Creatinine, Ser 1.08 (*)    Calcium 8.8 (*)    All other components within normal limits  CBC - Abnormal; Notable for the following components:   WBC 11.2 (*)    RBC 2.56 (*)    Hemoglobin 6.9 (*)    HCT 22.6 (*)    RDW 16.5 (*)    All other components within normal limits  URINALYSIS, ROUTINE W REFLEX MICROSCOPIC - Abnormal; Notable for the following components:   Ketones, ur TRACE (*)    Protein, ur 30 (*)    All other components within normal limits  PREGNANCY, URINE  CBG MONITORING, ED    ED ECG REPORT   Date: 11/10/2020  Rate: 93  Rhythm: normal sinus rhythm  QRS Axis: normal  Intervals: normal  ST/T Wave abnormalities: normal  Conduction Disutrbances:none  Narrative Interpretation: Normal ECG.  When compared with ECG of 10/26/2020, no significant changes are seen.  Old EKG Reviewed: unchanged  I have personally reviewed the EKG tracing and agree with the computerized printout as noted.   Radiology CT Head Wo Contrast  Result Date: 11/09/2020 CLINICAL DATA:  41 year old female with trauma. EXAM: CT HEAD WITHOUT CONTRAST TECHNIQUE: Contiguous axial images were obtained from the base of the skull through the vertex without intravenous contrast. COMPARISON:  None. FINDINGS: Brain: The ventricles and sulci appropriate size for patient's age. The  gray-white matter discrimination is preserved. There is no acute intracranial hemorrhage. No mass effect or midline shift. No extra-axial fluid collection. Vascular: No hyperdense vessel or unexpected calcification. Skull:  Normal. Negative for fracture or focal lesion. Sinuses/Orbits: No acute finding. Other: None IMPRESSION: Unremarkable noncontrast CT of the brain. Electronically Signed   By: Anner Crete M.D.   On: 11/09/2020 23:26   DG Hand Complete Left  Result Date: 11/09/2020 CLINICAL DATA:  Initial evaluation for acute pain status post fall. EXAM: LEFT HAND - COMPLETE 3+ VIEW COMPARISON:  Radiograph from 03/02/2018. FINDINGS: No acute fracture or dislocation. Remotely healed fracture of the left fifth metacarpal noted. Mild scattered osteoarthritic changes noted about the hand. No visible soft tissue injury. IMPRESSION: 1. No acute osseous abnormality about the left hand. 2. Remotely healed fracture of the left fifth metacarpal. Electronically Signed   By: Jeannine Boga M.D.   On: 11/09/2020 23:30    Procedures Procedures   Medications Ordered in ED Medications  morphine 4 MG/ML injection 4 mg (has no administration in time range)  ondansetron (ZOFRAN) injection 4 mg (has no administration in time range)  potassium chloride SA (KLOR-CON) CR tablet 40 mEq (40 mEq Oral Given 11/10/20 0014)    ED Course  I have reviewed the triage vital signs and the nursing notes.  Pertinent labs & imaging results that were available during my care of the patient were reviewed by me and considered in my medical decision making (see chart for details).   MDM Rules/Calculators/A&P                         Syncope in patient with history of recent GI bleed.  Hemoglobin today is 6.9 which is a significant drop from 7/20 at which time hemoglobin was 7.6.  Also noted is mild hypokalemia with potassium 3.4, she is given a dose of oral potassium.  Old records are reviewed confirming recent  hospitalization for GI bleeding and patient banding of hemorrhoids, but with hemoglobin continuing to drop.  She will need a blood transfusion, will need to be admitted to the hospital to get that.  Case is discussed with Dr. Marlowe Sax of [facility], who agrees to admit the patient.  Final Clinical Impression(s) / ED Diagnoses Final diagnoses:  Syncope, unspecified syncope type  Symptomatic anemia  Hypokalemia    Rx / DC Orders ED Discharge Orders     None        Delora Fuel, MD 56/43/32 [phone]

## 2020-11-09 NOTE — Telephone Encounter (Signed)
Informed patient that this medication was called in for her to warrant drug company. She verbalized understanding and I sent to her mychart account the address and phone number to the pharmacy

## 2020-11-09 NOTE — ED Triage Notes (Signed)
Patient here POV from Home after Fall.  Patient states she was washing clothes when she had acute onset of lightheadedness and had a syncopal episode approximately 1 Hour PTA.   Patient does not remember incident but recalls feeling okay throughout the day and shortly before syncopal episode, the patient became weak and diaphoretic.  Patient complaining of Pain to Head, Face, and L. Head.  BIB Wheelchair, Alert & Oriented. GCS 15. Patient feels weak. Hx of Anemia.

## 2020-11-10 ENCOUNTER — Encounter (HOSPITAL_COMMUNITY): Payer: Self-pay | Admitting: Internal Medicine

## 2020-11-10 DIAGNOSIS — F319 Bipolar disorder, unspecified: Secondary | ICD-10-CM | POA: Diagnosis not present

## 2020-11-10 DIAGNOSIS — R55 Syncope and collapse: Secondary | ICD-10-CM | POA: Diagnosis not present

## 2020-11-10 DIAGNOSIS — K51911 Ulcerative colitis, unspecified with rectal bleeding: Secondary | ICD-10-CM | POA: Diagnosis not present

## 2020-11-10 DIAGNOSIS — E876 Hypokalemia: Secondary | ICD-10-CM | POA: Diagnosis present

## 2020-11-10 DIAGNOSIS — D62 Acute posthemorrhagic anemia: Secondary | ICD-10-CM | POA: Diagnosis not present

## 2020-11-10 DIAGNOSIS — Z20822 Contact with and (suspected) exposure to covid-19: Secondary | ICD-10-CM | POA: Diagnosis not present

## 2020-11-10 DIAGNOSIS — Z7951 Long term (current) use of inhaled steroids: Secondary | ICD-10-CM | POA: Diagnosis not present

## 2020-11-10 DIAGNOSIS — K5732 Diverticulitis of large intestine without perforation or abscess without bleeding: Secondary | ICD-10-CM | POA: Diagnosis not present

## 2020-11-10 DIAGNOSIS — J45909 Unspecified asthma, uncomplicated: Secondary | ICD-10-CM | POA: Diagnosis not present

## 2020-11-10 DIAGNOSIS — K219 Gastro-esophageal reflux disease without esophagitis: Secondary | ICD-10-CM | POA: Diagnosis not present

## 2020-11-10 DIAGNOSIS — G43909 Migraine, unspecified, not intractable, without status migrainosus: Secondary | ICD-10-CM | POA: Diagnosis not present

## 2020-11-10 DIAGNOSIS — D509 Iron deficiency anemia, unspecified: Secondary | ICD-10-CM | POA: Diagnosis not present

## 2020-11-10 DIAGNOSIS — F419 Anxiety disorder, unspecified: Secondary | ICD-10-CM | POA: Diagnosis not present

## 2020-11-10 DIAGNOSIS — Z79899 Other long term (current) drug therapy: Secondary | ICD-10-CM | POA: Diagnosis not present

## 2020-11-10 DIAGNOSIS — D649 Anemia, unspecified: Secondary | ICD-10-CM | POA: Diagnosis not present

## 2020-11-10 DIAGNOSIS — F17210 Nicotine dependence, cigarettes, uncomplicated: Secondary | ICD-10-CM | POA: Diagnosis not present

## 2020-11-10 HISTORY — DX: Hypokalemia: E87.6

## 2020-11-10 LAB — CBG MONITORING, ED: Glucose-Capillary: 83 mg/dL (ref 70–99)

## 2020-11-10 LAB — RESP PANEL BY RT-PCR (FLU A&B, COVID) ARPGX2
Influenza A by PCR: NEGATIVE
Influenza B by PCR: NEGATIVE
SARS Coronavirus 2 by RT PCR: NEGATIVE

## 2020-11-10 LAB — PREPARE RBC (CROSSMATCH)

## 2020-11-10 MED ORDER — ONDANSETRON HCL 4 MG/2ML IJ SOLN
4.0000 mg | Freq: Once | INTRAMUSCULAR | Status: AC
  Administered 2020-11-10: 4 mg via INTRAVENOUS
  Filled 2020-11-10: qty 2

## 2020-11-10 MED ORDER — MORPHINE SULFATE (PF) 4 MG/ML IV SOLN
4.0000 mg | Freq: Once | INTRAVENOUS | Status: AC
  Administered 2020-11-10: 4 mg via INTRAVENOUS
  Filled 2020-11-10: qty 1

## 2020-11-10 MED ORDER — POTASSIUM CHLORIDE CRYS ER 20 MEQ PO TBCR
40.0000 meq | EXTENDED_RELEASE_TABLET | Freq: Once | ORAL | Status: AC
  Administered 2020-11-10: 40 meq via ORAL
  Filled 2020-11-10: qty 2

## 2020-11-10 MED ORDER — TOPIRAMATE 25 MG PO TABS
50.0000 mg | ORAL_TABLET | Freq: Two times a day (BID) | ORAL | Status: DC
  Administered 2020-11-10 – 2020-11-13 (×7): 50 mg via ORAL
  Filled 2020-11-10 (×7): qty 2

## 2020-11-10 MED ORDER — ONDANSETRON HCL 4 MG/2ML IJ SOLN: 4.0000 mg | Freq: Four times a day (QID) | INTRAMUSCULAR | Status: DC | PRN

## 2020-11-10 MED ORDER — ONDANSETRON HCL 4 MG PO TABS: 4.0000 mg | ORAL_TABLET | Freq: Four times a day (QID) | ORAL | Status: DC | PRN

## 2020-11-10 MED ORDER — QUETIAPINE FUMARATE 25 MG PO TABS
150.0000 mg | ORAL_TABLET | Freq: Every day | ORAL | Status: DC
  Administered 2020-11-10 – 2020-11-12 (×3): 150 mg via ORAL
  Filled 2020-11-10 (×3): qty 1

## 2020-11-10 MED ORDER — SODIUM CHLORIDE 0.9 % IV SOLN: INTRAVENOUS | Status: DC

## 2020-11-10 MED ORDER — SODIUM CHLORIDE 0.9% IV SOLUTION: Freq: Once | INTRAVENOUS | Status: AC

## 2020-11-10 MED ORDER — HYDROCODONE-ACETAMINOPHEN 5-325 MG PO TABS
1.0000 | ORAL_TABLET | Freq: Four times a day (QID) | ORAL | Status: DC | PRN
Start: 2020-11-10 — End: 2020-11-13
  Administered 2020-11-10 – 2020-11-13 (×9): 1 via ORAL
  Filled 2020-11-10 (×9): qty 1

## 2020-11-10 MED ORDER — LISDEXAMFETAMINE DIMESYLATE 70 MG PO CAPS
70.0000 mg | ORAL_CAPSULE | Freq: Every morning | ORAL | Status: DC
  Administered 2020-11-10 – 2020-11-13 (×4): 70 mg via ORAL
  Filled 2020-11-10 (×4): qty 1

## 2020-11-10 MED ORDER — FUSION PLUS PO CAPS: 1.0000 | ORAL_CAPSULE | Freq: Every day | ORAL | Status: DC

## 2020-11-10 MED ORDER — LAMOTRIGINE 100 MG PO TABS
200.0000 mg | ORAL_TABLET | Freq: Every day | ORAL | Status: DC
  Administered 2020-11-10 – 2020-11-13 (×4): 200 mg via ORAL
  Filled 2020-11-10 (×4): qty 2

## 2020-11-10 MED ORDER — CYCLOBENZAPRINE HCL 10 MG PO TABS
10.0000 mg | ORAL_TABLET | Freq: Three times a day (TID) | ORAL | Status: DC | PRN
  Administered 2020-11-10 – 2020-11-11 (×3): 10 mg via ORAL
  Filled 2020-11-10 (×3): qty 1

## 2020-11-10 MED ORDER — GABAPENTIN 300 MG PO CAPS
900.0000 mg | ORAL_CAPSULE | Freq: Two times a day (BID) | ORAL | Status: DC
  Administered 2020-11-10 – 2020-11-13 (×7): 900 mg via ORAL
  Filled 2020-11-10 (×7): qty 3

## 2020-11-10 MED ORDER — MOMETASONE FURO-FORMOTEROL FUM 200-5 MCG/ACT IN AERO: 2.0000 | INHALATION_SPRAY | Freq: Two times a day (BID) | RESPIRATORY_TRACT | Status: DC

## 2020-11-10 NOTE — Plan of Care (Signed)
  Problem: Education: Goal: Knowledge of General Education information will improve Description: Including pain rating scale, medication(s)/side effects and non-pharmacologic comfort measures Outcome: Progressing   Problem: Clinical Measurements: Goal: Diagnostic test results will improve Outcome: Progressing   Problem: Coping: Goal: Level of anxiety will decrease Outcome: Progressing

## 2020-11-10 NOTE — Plan of Care (Signed)
[facility] will assume care on arrival to accepting facility. Until arrival, care as per EDP. However, [facility] available 24/7 for questions and assistance.

## 2020-11-10 NOTE — ED Notes (Signed)
Report provided to [facility]

## 2020-11-10 NOTE — Plan of Care (Signed)
  Problem: Clinical Measurements: Goal: Cardiovascular complication will be avoided Outcome: Progressing   Problem: Education: Goal: Knowledge of General Education information will improve Description: Including pain rating scale, medication(s)/side effects and non-pharmacologic comfort measures Outcome: Completed/Met   Problem: Activity: Goal: Risk for activity intolerance will decrease Outcome: Completed/Met

## 2020-11-10 NOTE — H&P (Signed)
History and Physical    Victoria Pierce XMI:[id number] DOB: February 12, 1981 DOA: 11/09/2020  PCP: [name], MD   Patient coming from: Home  I have personally briefly reviewed patient's old medical records in [place]  Chief Complaint: "I passed out"  HPI: Victoria Pierce is a 40 y.o. female with medical history significant for   for ulcerative colitis, diverticulitis, diverticulosis, asthma, nicotine dependence who presents to the emergency room for evaluation after she had a witnessed syncopal episode.  Patient states that she was at the [facility] and was getting something out of the dryer when she suddenly felt lightheaded and passed out hitting her head on the floor.  She states that she landed on the right side of her face.  Loss of consciousness was transient and she complains of pain around the right side of her jaw.  Patient states she has had 2 prior episodes over the last couple of days. She was recently admitted at [facility] for evaluation of symptomatic anemia and at that time received blood transfusion for significant drop in her hemoglobin from 14.6g/dl in 2021 to 6.2g/dl. She had a nuclear medicine scan as well as a CT scan of abdomen and pelvis with contrast which did not reveal active GI bleeding but showed colonic diverticulosis.  Colonoscopy was recommended but patient left AGAINST MEDICAL ADVICE. She has since followed up with her gastroenterologist and has had banding of an internal hemorrhoid. She denies having any hematochezia, no hematemesis, no melena stools, she denies any NSAID use, denies having abdominal pain, no nausea, no vomiting, no headache, no palpitations, no chest pain, no shortness of breath, no cough, no fever, no chills, no lower extremity swelling. Labs show sodium 140, potassium 3.4, chloride 110, bicarb 22, glucose 76, BUN 13, creatinine 1.08, calcium 8.8, white count 11.2, hemoglobin 6.9 down from 7.6g/dl about a week ago,  hematocrit 22.6, MCV 88.3, RDW 16.5, platelet count 363 Respiratory viral panel is negative Urine analysis is sterile CT scan of the head without contrast showed no acute findings. X-ray of the left hand shows no acute osseous abnormality.  Remote healed fracture of the left fifth metacarpal Twelve-lead EKG reviewed by me shows normal sinus rhythm.     ED Course: Patient is a 40 year old female who presents to the ER for evaluation after having a witnessed syncopal episode. She was recently hospitalized for symptomatic anemia and was transfused 1 unit of packed RBC at that time.  She has also received banding of an internal hemorrhoid since her last hospitalization. Labs reveal a drop in her H&H from 7.6g/dl to 6.9g/dl in the last 1 week. She had a drop from 14g/dl a year ago to 6.2g/dl about 2 weeks prior to this hospitalization and was transfused 1 unit of packed RBC. She will be referred to observation status and will be transfused a unit of packed RBC.   Review of Systems: As per HPI otherwise all other systems reviewed and negative.    Past Medical History:  Diagnosis Date   Anxiety    Asthma    Bipolar 1 disorder ([place])    Depression    Diverticulitis    GERD (gastroesophageal reflux disease)    Hernia, abdominal    Hiatal hernia    Ulcerative colitis    Vaginal septum     Past Surgical History:  Procedure Laterality Date   CLOSED REDUCTION METACARPAL WITH PERCUTANEOUS PINNING Left 03/07/2018   Procedure: CLOSED REDUCTION METACARPAL WITH PERCUTANEOUS PINNING-5th metacarpal;  Surgeon: Corky Mull, MD;  Location: [facility] ORS;  Service: Orthopedics;  Laterality: Left;   COLONOSCOPY WITH PROPOFOL N/A 05/14/2018   Procedure: COLONOSCOPY WITH PROPOFOL;  Surgeon: Lin Landsman, MD;  Location: [facility] ENDOSCOPY;  Service: Gastroenterology;  Laterality: N/A;   ESOPHAGOGASTRODUODENOSCOPY (EGD) WITH PROPOFOL N/A 05/14/2018   Procedure: ESOPHAGOGASTRODUODENOSCOPY (EGD) WITH PROPOFOL;   Surgeon: Lin Landsman, MD;  Location: [facility] ENDOSCOPY;  Service: Gastroenterology;  Laterality: N/A;   ESOPHAGOGASTRODUODENOSCOPY (EGD) WITH PROPOFOL N/A 03/06/2019   Procedure: ESOPHAGOGASTRODUODENOSCOPY (EGD) WITH PROPOFOL;  Surgeon: Lin Landsman, MD;  Location: [facility] ENDOSCOPY;  Service: Gastroenterology;  Laterality: N/A;   ESOPHAGOGASTRODUODENOSCOPY (EGD) WITH PROPOFOL N/A 02/04/2020   Procedure: ESOPHAGOGASTRODUODENOSCOPY (EGD) WITH PROPOFOL;  Surgeon: Lin Landsman, MD;  Location: [facility] ENDOSCOPY;  Service: Gastroenterology;  Laterality: N/A;   FLEXIBLE SIGMOIDOSCOPY N/A 02/04/2020   Procedure: FLEXIBLE SIGMOIDOSCOPY;  Surgeon: Lin Landsman, MD;  Location: [facility] ENDOSCOPY;  Service: Gastroenterology;  Laterality: N/A;   FRACTURE SURGERY     HARDWARE REMOVAL Left 04/03/2018   Procedure: LEFT HAND FIFTH METACARPAL PIN REMOVAL;  Surgeon: Corky Mull, MD;  Location: [place];  Service: Orthopedics;  Laterality: Left;   HERNIA REPAIR     ROBOTIC ASSISTED LAPAROSCOPIC NISSEN FUNDOPLICATION N/A 12/10/35   Procedure: ROBOTIC ASSISTED LAPAROSCOPIC NISSEN FUNDOPLICATION;  Surgeon: Jules Husbands, MD;  Location: [facility] ORS;  Service: General;  Laterality: N/A;   TRIGGER FINGER RELEASE Left 06/19/2018   Procedure: EXTENSOR TENOLYSIS WITH CAPSULAR RELEASE OF LEFT LITTLE MCP JOINT;  Surgeon: Corky Mull, MD;  Location: [place];  Service: Orthopedics;  Laterality: Left;   TUBAL LIGATION       reports that she has been smoking cigarettes. She has a 27.00 pack-year smoking history. She has never used smokeless tobacco. She reports current drug use. Drug: Marijuana. She reports that she does not drink alcohol.  No Known Allergies  Family History  Problem Relation Age of Onset   Hypertension Sister    Hyperlipidemia Mother    Hypertension Mother    Hypertension Father    Hyperlipidemia Father       Prior to Admission medications   Medication Sig  Start Date End Date Taking? Authorizing Provider  budesonide-formoterol (SYMBICORT) 160-4.5 MCG/ACT inhaler Inhale 2 puffs into the lungs in the morning and at bedtime. 08/19/20   Chesley Mires, MD  cyclobenzaprine (FLEXERIL) 10 MG tablet Take 10 mg by mouth 3 (three) times daily as needed for muscle spasms.     [provider]  ferrous sulfate 325 (65 FE) MG tablet Take 1 tablet (325 mg total) by mouth daily. Patient not taking: Reported on 11/03/2020 11/03/20   Margette Fast, MD  gabapentin (NEURONTIN) 300 MG capsule Take 900 mg by mouth 2 (two) times daily. 10/08/20   [provider]  hydrocortisone (ANUSOL-HC) 2.5 % rectal cream Place 1 application rectally 2 (two) times daily. 08/10/20   Lin Landsman, MD  hydrocortisone (ANUSOL-HC) 25 MG suppository Place 1 suppository (25 mg total) rectally 2 (two) times daily as needed for hemorrhoids or anal itching. Patient not taking: Reported on 11/03/2020 11/03/20   Margette Fast, MD  Iron-FA-B Cmp-C-Biot-Probiotic (FUSION PLUS) CAPS Take 1 capsule by mouth daily. 11/03/20   Lin Landsman, MD  lamoTRIgine (LAMICTAL) 200 MG tablet Take 200 mg by mouth daily. 11/01/20   [provider]  naproxen (NAPROSYN) 500 MG tablet Take 500 mg by mouth 2 (two) times daily. 02/14/20   [provider]  ondansetron (ZOFRAN-ODT) 4 MG disintegrating tablet Take 1 tablet (4 mg total) by mouth every 6 (six) hours as needed for nausea or vomiting. 09/27/18   Tylene Fantasia, PA-C  propranolol (INDERAL) 20 MG tablet Take 20 mg by mouth 4 (four) times daily. 06/16/19   [provider]  QUEtiapine (SEROQUEL) 50 MG tablet Take 150 mg by mouth at bedtime. 05/25/20   [provider]  topiramate (TOPAMAX) 50 MG tablet Take 50 mg by mouth 2 (two) times daily.    [provider]  VYVANSE 70 MG capsule Take 70 mg by mouth every morning. 11/01/20   [provider]  albuterol (VENTOLIN HFA) 108 (90 Base) MCG/ACT  inhaler Inhale 2 puffs into the lungs every 6 (six) hours as needed for wheezing or shortness of breath. Patient not taking: No sig reported 08/19/20 10/27/20  Chesley Mires, MD    Physical Exam: Vitals:   11/10/20 0144 11/10/20 0148 11/10/20 0628 11/10/20 0921  BP:  127/80 121/87 120/79  Pulse:  94 94 88  Resp:  20 16 19   Temp:  98.8 F (37.1 C) 99.2 F (37.3 C) 98.7 F (37.1 C)  TempSrc:  Oral Oral Oral  SpO2:  100% 99% 97%  Weight: 69 kg     Height: 5' 3"  (1.6 m)        Vitals:   11/10/20 0144 11/10/20 0148 11/10/20 0628 11/10/20 0921  BP:  127/80 121/87 120/79  Pulse:  94 94 88  Resp:  20 16 19   Temp:  98.8 F (37.1 C) 99.2 F (37.3 C) 98.7 F (37.1 C)  TempSrc:  Oral Oral Oral  SpO2:  100% 99% 97%  Weight: 69 kg     Height: 5' 3"  (1.6 m)         Constitutional: Alert and oriented x 3 . Not in any apparent distress HEENT:      Head: Normocephalic and atraumatic.         Eyes: PERLA, EOMI, Conjunctivae are normal. Sclera is non-icteric.       Mouth/Throat: Mucous membranes are moist.       Neck: Supple with no signs of meningismus. Cardiovascular: Regular rate and rhythm. No murmurs, gallops, or rubs. 2+ symmetrical distal pulses are present . No JVD. No LE edema Respiratory: Respiratory effort normal .Lungs sounds clear bilaterally. No wheezes, crackles, or rhonchi.  Gastrointestinal: Soft, non tender, and non distended with positive bowel sounds.  Genitourinary: No CVA tenderness. Musculoskeletal: Nontender with normal range of motion in all extremities. No cyanosis, or erythema of extremities. Neurologic:  Face is symmetric. Moving all extremities. No gross focal neurologic deficits . Skin: Skin is warm, dry.  No rash or ulcers Psychiatric: Mood and affect are normal    Labs on Admission: I have personally reviewed following labs and imaging studies  CBC: Recent Labs  Lab 11/03/20 1547 11/09/20 2210  WBC 10.4 11.2*  HGB 7.6* 6.9*  HCT 24.7* 22.6*  MCV  87 88.3  PLT 321 983   Basic Metabolic Panel: Recent Labs  Lab 11/09/20 2210  NA 140  K 3.4*  CL 110  CO2 22  GLUCOSE 76  BUN 13  CREATININE 1.08*  CALCIUM 8.8*   GFR: Estimated Creatinine Clearance: 64.5 mL/min (A) (by C-G formula based on SCr of 1.08 mg/dL (H)). Liver Function Tests: No results for input(s): AST, ALT, ALKPHOS, BILITOT, PROT, ALBUMIN in the last 168 hours. No results for input(s): LIPASE, AMYLASE in the last 168  hours. No results for input(s): AMMONIA in the last 168 hours. Coagulation Profile: No results for input(s): INR, PROTIME in the last 168 hours. Cardiac Enzymes: No results for input(s): CKTOTAL, CKMB, CKMBINDEX, TROPONINI in the last 168 hours. BNP (last 3 results) No results for input(s): PROBNP in the last 8760 hours. HbA1C: No results for input(s): HGBA1C in the last 72 hours. CBG: Recent Labs  Lab 11/09/20 2214  GLUCAP 83   Lipid Profile: No results for input(s): CHOL, HDL, LDLCALC, TRIG, CHOLHDL, LDLDIRECT in the last 72 hours. Thyroid Function Tests: No results for input(s): TSH, T4TOTAL, FREET4, T3FREE, THYROIDAB in the last 72 hours. Anemia Panel: No results for input(s): VITAMINB12, FOLATE, FERRITIN, TIBC, IRON, RETICCTPCT in the last 72 hours. Urine analysis:    Component Value Date/Time   COLORURINE YELLOW 11/09/2020 2210   APPEARANCEUR CLEAR 11/09/2020 2210   LABSPEC 1.022 11/09/2020 2210   PHURINE 6.5 11/09/2020 2210   GLUCOSEU NEGATIVE 11/09/2020 2210   HGBUR NEGATIVE 11/09/2020 2210   BILIRUBINUR NEGATIVE 11/09/2020 2210   KETONESUR TRACE (A) 11/09/2020 2210   PROTEINUR 30 (A) 11/09/2020 2210   UROBILINOGEN 0.2 06/17/2011 2018   NITRITE NEGATIVE 11/09/2020 2210   LEUKOCYTESUR NEGATIVE 11/09/2020 2210    Radiological Exams on Admission: CT Head Wo Contrast  Result Date: 11/09/2020 CLINICAL DATA:  40 year old female with trauma. EXAM: CT HEAD WITHOUT CONTRAST TECHNIQUE: Contiguous axial images were obtained from the  base of the skull through the vertex without intravenous contrast. COMPARISON:  None. FINDINGS: Brain: The ventricles and sulci appropriate size for patient's age. The gray-white matter discrimination is preserved. There is no acute intracranial hemorrhage. No mass effect or midline shift. No extra-axial fluid collection. Vascular: No hyperdense vessel or unexpected calcification. Skull: Normal. Negative for fracture or focal lesion. Sinuses/Orbits: No acute finding. Other: None IMPRESSION: Unremarkable noncontrast CT of the brain. Electronically Signed   By: Anner Crete M.D.   On: 11/09/2020 23:26   DG Hand Complete Left  Result Date: 11/09/2020 CLINICAL DATA:  Initial evaluation for acute pain status post fall. EXAM: LEFT HAND - COMPLETE 3+ VIEW COMPARISON:  Radiograph from 03/02/2018. FINDINGS: No acute fracture or dislocation. Remotely healed fracture of the left fifth metacarpal noted. Mild scattered osteoarthritic changes noted about the hand. No visible soft tissue injury. IMPRESSION: 1. No acute osseous abnormality about the left hand. 2. Remotely healed fracture of the left fifth metacarpal. Electronically Signed   By: Jeannine Boga M.D.   On: 11/09/2020 23:30     Assessment/Plan Principal Problem:   Symptomatic anemia Active Problems:   Chronic GERD   Anxiety   Tobacco use disorder   Syncope and collapse   Hypokalemia      Symptomatic anemia Patient presents for evaluation of a syncopal episode and is noted to have anemia with a hemoglobin of 6.9g/dl She was recently hospitalized 2 weeks prior to this admission and at that time had a hemoglobin of 6.2g/dl.  She was transfused 1 unit of packed RBC with appropriate improvement in her H&H to 8.2g/dl. Her H&H has continued to decline over the last 2 weeks from 8.2g/dl posttransfusion to 6.9g/dl. She denies any symptoms of acute blood loss We will transfuse 1 unit of packed RBC Continue iron  supplementation     Hypokalemia Potassium has been supplemented     Nicotine dependence Smoking cessation has been discussed with patient in detail She declines a nicotine transdermal patch at this time     Anxiety/depression Continue Seroquel and Vynase  DVT prophylaxis: SCD  Code Status: full code  Family Communication: Greater than 50% of time was spent discussing patient's condition and plan of care with her at the bedside.  All questions and concerns have been addressed.  She verbalizes understanding and agrees to the plan. Disposition Plan: Back to previous home environment Consults called: None Status: Observation    Trestan Vahle MD [facility]     11/10/2020, 9:37 AM

## 2020-11-11 DIAGNOSIS — D509 Iron deficiency anemia, unspecified: Secondary | ICD-10-CM | POA: Diagnosis present

## 2020-11-11 DIAGNOSIS — K922 Gastrointestinal hemorrhage, unspecified: Secondary | ICD-10-CM | POA: Diagnosis not present

## 2020-11-11 DIAGNOSIS — F419 Anxiety disorder, unspecified: Secondary | ICD-10-CM

## 2020-11-11 DIAGNOSIS — K51911 Ulcerative colitis, unspecified with rectal bleeding: Secondary | ICD-10-CM | POA: Diagnosis present

## 2020-11-11 DIAGNOSIS — D649 Anemia, unspecified: Secondary | ICD-10-CM | POA: Diagnosis not present

## 2020-11-11 DIAGNOSIS — K219 Gastro-esophageal reflux disease without esophagitis: Secondary | ICD-10-CM | POA: Diagnosis present

## 2020-11-11 DIAGNOSIS — F319 Bipolar disorder, unspecified: Secondary | ICD-10-CM | POA: Diagnosis present

## 2020-11-11 DIAGNOSIS — K5732 Diverticulitis of large intestine without perforation or abscess without bleeding: Secondary | ICD-10-CM | POA: Diagnosis present

## 2020-11-11 DIAGNOSIS — E876 Hypokalemia: Secondary | ICD-10-CM

## 2020-11-11 DIAGNOSIS — F17210 Nicotine dependence, cigarettes, uncomplicated: Secondary | ICD-10-CM | POA: Diagnosis present

## 2020-11-11 DIAGNOSIS — Z79899 Other long term (current) drug therapy: Secondary | ICD-10-CM | POA: Diagnosis not present

## 2020-11-11 DIAGNOSIS — R55 Syncope and collapse: Secondary | ICD-10-CM | POA: Diagnosis present

## 2020-11-11 DIAGNOSIS — Z20822 Contact with and (suspected) exposure to covid-19: Secondary | ICD-10-CM | POA: Diagnosis present

## 2020-11-11 DIAGNOSIS — J45909 Unspecified asthma, uncomplicated: Secondary | ICD-10-CM | POA: Diagnosis present

## 2020-11-11 DIAGNOSIS — Z7951 Long term (current) use of inhaled steroids: Secondary | ICD-10-CM | POA: Diagnosis not present

## 2020-11-11 DIAGNOSIS — G43909 Migraine, unspecified, not intractable, without status migrainosus: Secondary | ICD-10-CM | POA: Diagnosis present

## 2020-11-11 DIAGNOSIS — D62 Acute posthemorrhagic anemia: Secondary | ICD-10-CM | POA: Diagnosis present

## 2020-11-11 LAB — CBC
HCT: 24.9 % — ABNORMAL LOW (ref 36.0–46.0)
Hemoglobin: 7.4 g/dL — ABNORMAL LOW (ref 12.0–15.0)
MCH: 26.8 pg (ref 26.0–34.0)
MCHC: 29.7 g/dL — ABNORMAL LOW (ref 30.0–36.0)
MCV: 90.2 fL (ref 80.0–100.0)
Platelets: 277 10*3/uL (ref 150–400)
RBC: 2.76 MIL/uL — ABNORMAL LOW (ref 3.87–5.11)
RDW: 16.9 % — ABNORMAL HIGH (ref 11.5–15.5)
WBC: 6.4 10*3/uL (ref 4.0–10.5)
nRBC: 0 % (ref 0.0–0.2)

## 2020-11-11 LAB — HEMOGLOBIN AND HEMATOCRIT, BLOOD
HCT: 25.8 % — ABNORMAL LOW (ref 36.0–46.0)
HCT: 29.3 % — ABNORMAL LOW (ref 36.0–46.0)
Hemoglobin: 7.7 g/dL — ABNORMAL LOW (ref 12.0–15.0)
Hemoglobin: 8.8 g/dL — ABNORMAL LOW (ref 12.0–15.0)

## 2020-11-11 LAB — BASIC METABOLIC PANEL
Anion gap: 4 — ABNORMAL LOW (ref 5–15)
BUN: 6 mg/dL (ref 6–20)
CO2: 22 mmol/L (ref 22–32)
Calcium: 8.4 mg/dL — ABNORMAL LOW (ref 8.9–10.3)
Chloride: 114 mmol/L — ABNORMAL HIGH (ref 98–111)
Creatinine, Ser: 0.78 mg/dL (ref 0.44–1.00)
GFR, Estimated: >60 mL/min (ref >60)
Glucose, Bld: 94 mg/dL (ref 70–99)
Potassium: 4.2 mmol/L (ref 3.5–5.1)
Sodium: 140 mmol/L (ref 135–145)

## 2020-11-11 LAB — PREPARE RBC (CROSSMATCH)

## 2020-11-11 MED ORDER — SODIUM CHLORIDE 0.9% IV SOLUTION: Freq: Once | INTRAVENOUS | Status: AC

## 2020-11-11 MED ORDER — SODIUM CHLORIDE 0.9 % IV SOLN: INTRAVENOUS | Status: DC

## 2020-11-11 MED ORDER — SODIUM CHLORIDE 0.9 % IV SOLN
510.0000 mg | Freq: Once | INTRAVENOUS | Status: AC
  Administered 2020-11-11: 510 mg via INTRAVENOUS
  Filled 2020-11-11: qty 17

## 2020-11-11 MED ORDER — PANTOPRAZOLE SODIUM 40 MG PO TBEC
40.0000 mg | DELAYED_RELEASE_TABLET | Freq: Every day | ORAL | Status: DC
  Administered 2020-11-11 – 2020-11-12 (×2): 40 mg via ORAL
  Filled 2020-11-11 (×2): qty 1

## 2020-11-11 MED ORDER — PEG 3350-KCL-NA BICARB-NACL 420 G PO SOLR
4000.0000 mL | Freq: Once | ORAL | Status: AC
  Administered 2020-11-11: 4000 mL via ORAL

## 2020-11-11 MED ORDER — PROPRANOLOL HCL 20 MG PO TABS
20.0000 mg | ORAL_TABLET | Freq: Two times a day (BID) | ORAL | Status: DC
  Administered 2020-11-11 – 2020-11-13 (×5): 20 mg via ORAL
  Filled 2020-11-11 (×5): qty 1

## 2020-11-11 NOTE — Progress Notes (Signed)
[facility]                                                                              Patient Demographics  Victoria Pierce, is a 40 y.o. female, DOB - 08/06/1980, UVO:[id number]  Admit date - 11/09/2020   Admitting Physician Shela Leff, MD  Outpatient Primary MD for the patient is [name], MD  Outpatient specialists:   LOS - 0  days   Medical records reviewed and are as summarized below:    Chief Complaint  Patient presents with   Loss of Consciousness       Brief summary   Patient is a 40 year old female with ulcerative colitis, diverticulitis, diverticulosis, asthma, hemorrhoids, nicotine dependence presented after a witnessed syncopal episode.  Patient reported that she was at a laundromat and was getting something out of the dryer when she felt dizzy and lightheaded, passed out hitting her head on the floor.  She landed on the right side of her face, subsequently complained about pain around her right side of the jaw.  Also reported 2 prior episodes over the last couple of days. Patient was recently admitted at [facility], on 10/26/2020 however left AMA on 7/13.  She was admitted with GI bleed.  She had received blood transfusion for hemoglobin of 6.2.  Tagged RBC scan did not reveal active GI bleeding but showed colonic diverticulosis.  Colonoscopy was recommended. She did follow-up with her GI at [facility] and had banding of the internal hemorrhoid last week.  In ED CT head showed no acute findings, x-ray of left hand showed no acute fracture.  Hemoglobin down to 6.9  Assessment & Plan    Principal Problem:   Symptomatic anemia, acute blood loss anemia, lower GI bleed, iron deficiency anemia -Per patient she had 1 episode of BRBPR overnight.  Previously was recommended colonoscopy however she had left AMA. -s/p 1 unit packed RBCs last night, hemoglobin up to 7.7 and trended down to 7.4 again this a.m. - will transfuse 1 unit packed  RBCs  Active Problems: Iron deficiency anemia -Anemia panel showed severe iron deficiency anemia, ferritin 4, iron saturation 3, Fe 12.  B12, folate WNL,  - will transfuse 1 unit of Feraheme IV    Chronic GERD -Placed on Protonix 40 mg daily -Has been taking naproxen PRN for headaches, counseled on stopping NSAIDs     Anxiety, history of bipolar disorder -Resumed all psych meds including Seroquel, Inderal, Lamictal, Vyvanse, gabapentin -Takes propranolol 20 mg qid, however resumed twice daily as BP is soft    Tobacco use disorder -Counseled on smoking cessation    Syncope and collapse -Likely due to #1, continue transfusion and work-up    Hypokalemia -Resolved  History of migraines -Continue Topamax   Code Status: FULL DVT Prophylaxis:  SCDs Start: 11/10/20 0918   Level of Care: Level of care: Telemetry Family Communication: Discussed all imaging results, lab results, explained to the patient and patient's significant other on the phone   Disposition Plan:     Status is: Observation  The patient remains OBS appropriate and will d/c before 2 midnights.  Dispo: The patient  is from: Home              Anticipated d/c is to: Home              Patient currently is not medically stable to d/c.   Difficult to place patient No      Time Spent in minutes   35 minutes  Procedures:  None  Consultants:   Gastroenterology  Antimicrobials:   Anti-infectives (From admission, onward)    None          Medications  Scheduled Meds:  sodium chloride   Intravenous Once   gabapentin  900 mg Oral BID   lamoTRIgine  200 mg Oral Daily   lisdexamfetamine  70 mg Oral q morning   polyethylene glycol-electrolytes  4,000 mL Oral Once   propranolol  20 mg Oral BID   QUEtiapine  150 mg Oral QHS   topiramate  50 mg Oral BID   Continuous Infusions:  sodium chloride 100 mL/hr at 11/11/20 0732   ferumoxytol     PRN Meds:.cyclobenzaprine, HYDROcodone-acetaminophen,  ondansetron **OR** ondansetron (ZOFRAN) IV      Subjective:   Donyae Kohn was seen and examined today.  States overnight had 1 episode of BRBPR.  Currently no abdominal pain nausea or vomiting.  Patient denies dizziness, chest pain, shortness of breathnew weakness, numbess, tingling. No fevers  Objective:   Vitals:   11/11/20 0545 11/11/20 0907 11/11/20 0958 11/11/20 1025  BP: 114/78 115/72 122/69 117/84  Pulse: 87 84 79 79  Resp: 16  16 16   Temp: 97.8 F (36.6 C)  98 F (36.7 C) 98.2 F (36.8 C)  TempSrc:   Oral Oral  SpO2: 100%  100% 100%  Weight:      Height:        Intake/Output Summary (Last 24 hours) at 11/11/2020 1111 Last data filed at 11/11/2020 1941 Gross per 24 hour  Intake 2646.05 ml  Output --  Net 2646.05 ml     Wt Readings from Last 3 Encounters:  11/10/20 69 kg  11/03/20 70.4 kg  10/26/20 68 kg     Exam General: Alert and oriented x 3, NAD Cardiovascular: S1 S2 auscultated, no murmurs, RRR Respiratory: Clear to auscultation bilaterally, no wheezing, rales or rhonchi Gastrointestinal: Soft, nontender, nondistended, + bowel sounds Ext: no pedal edema bilaterally Neuro: no new deficits Musculoskeletal: No digital cyanosis, clubbing Skin: No rashes Psych: Normal affect and demeanor, alert and oriented x3    Data Reviewed:  I have personally reviewed following labs and imaging studies  Micro Results Recent Results (from the past 240 hour(s))  Resp Panel by RT-PCR (Flu A&B, Covid) Nasopharyngeal Swab     Status: None   Collection Time: 11/10/20 12:25 AM   Specimen: Nasopharyngeal Swab; Nasopharyngeal(NP) swabs in vial transport medium  Result Value Ref Range Status   SARS Coronavirus 2 by RT PCR NEGATIVE NEGATIVE Final    Comment: (NOTE) SARS-CoV-2 target nucleic acids are NOT DETECTED.  The SARS-CoV-2 RNA is generally detectable in upper respiratory specimens during the acute phase of infection. The lowest concentration of SARS-CoV-2  viral copies this assay can detect is 138 copies/mL. A negative result does not preclude SARS-Cov-2 infection and should not be used as the sole basis for treatment or other patient management decisions. A negative result may occur with  improper specimen collection/handling, submission of specimen other than nasopharyngeal swab, presence of viral mutation(s) within the areas targeted by this assay, and inadequate number of viral copies(<138  copies/mL). A negative result must be combined with clinical observations, patient history, and epidemiological information. The expected result is Negative.  Fact Sheet for Patients:  EntrepreneurPulse.com.au  Fact Sheet for Healthcare Providers:  IncredibleEmployment.be  This test is no t yet approved or cleared by the [place] FDA and  has been authorized for detection and/or diagnosis of SARS-CoV-2 by FDA under an Emergency Use Authorization (EUA). This EUA will remain  in effect (meaning this test can be used) for the duration of the COVID-19 declaration under Section 564(b)(1) of the Act, 21 U.S.C.section 360bbb-3(b)(1), unless the authorization is terminated  or revoked sooner.       Influenza A by PCR NEGATIVE NEGATIVE Final   Influenza B by PCR NEGATIVE NEGATIVE Final    Comment: (NOTE) The Xpert Xpress SARS-CoV-2/FLU/RSV plus assay is intended as an aid in the diagnosis of influenza from Nasopharyngeal swab specimens and should not be used as a sole basis for treatment. Nasal washings and aspirates are unacceptable for Xpert Xpress SARS-CoV-2/FLU/RSV testing.  Fact Sheet for Patients: EntrepreneurPulse.com.au  Fact Sheet for Healthcare Providers: IncredibleEmployment.be  This test is not yet approved or cleared by the [place] FDA and has been authorized for detection and/or diagnosis of SARS-CoV-2 by FDA under an Emergency Use Authorization  (EUA). This EUA will remain in effect (meaning this test can be used) for the duration of the COVID-19 declaration under Section 564(b)(1) of the Act, 21 U.S.C. section 360bbb-3(b)(1), unless the authorization is terminated or revoked.  Performed at [facility], [address] [place], [place] [address]     Radiology Reports CT Head Wo Contrast  Result Date: 11/09/2020 CLINICAL DATA:  40 year old female with trauma. EXAM: CT HEAD WITHOUT CONTRAST TECHNIQUE: Contiguous axial images were obtained from the base of the skull through the vertex without intravenous contrast. COMPARISON:  None. FINDINGS: Brain: The ventricles and sulci appropriate size for patient's age. The gray-white matter discrimination is preserved. There is no acute intracranial hemorrhage. No mass effect or midline shift. No extra-axial fluid collection. Vascular: No hyperdense vessel or unexpected calcification. Skull: Normal. Negative for fracture or focal lesion. Sinuses/Orbits: No acute finding. Other: None IMPRESSION: Unremarkable noncontrast CT of the brain. Electronically Signed   By: Anner Crete M.D.   On: 11/09/2020 23:26   NM GI Blood Loss  Result Date: 10/27/2020 CLINICAL DATA:  Lower GI bleed for a couple months. Last episode earlier today. EXAM: NUCLEAR MEDICINE GASTROINTESTINAL BLEEDING SCAN TECHNIQUE: Sequential abdominal images were obtained following intravenous administration of Tc-82mlabeled red blood cells. RADIOPHARMACEUTICALS:  20.5 mCi Tc-916mertechnetate in-vitro labeled red cells. COMPARISON:  Abdominopelvic CT 10/26/2020. FINDINGS: Anterior sequential imaging of the abdomen and pelvis was performed for 1 hour. Patient declined further imaging. Through the imaged interval, no abnormalities are identified. There is normal blood pool, hepatic and splenic activity. No evidence of active GI bleeding. IMPRESSION: No evidence of active gastrointestinal bleeding. Electronically Signed    By: WiRichardean Sale.D.   On: 10/27/2020 16:44   CT Abdomen Pelvis W Contrast  Result Date: 10/26/2020 CLINICAL DATA:  Abdominal distension, rectal bleeding for 2 months EXAM: CT ABDOMEN AND PELVIS WITH CONTRAST TECHNIQUE: Multidetector CT imaging of the abdomen and pelvis was performed using the standard protocol following bolus administration of intravenous contrast. CONTRAST:  10070mMNIPAQUE IOHEXOL 300 MG/ML  SOLN COMPARISON:  01/19/2020 FINDINGS: Lower chest: No acute abnormality. Dependent bibasilar scarring and or partial atelectasis. Hepatobiliary: No solid liver abnormality is seen. No gallstones, gallbladder  wall thickening, or biliary dilatation. Pancreas: Unremarkable. No pancreatic ductal dilatation or surrounding inflammatory changes. Spleen: Normal in size without significant abnormality. Adrenals/Urinary Tract: Adrenal glands are unremarkable. Kidneys are normal, without renal calculi, solid lesion, or hydronephrosis. Bladder is unremarkable. Stomach/Bowel: Stomach is within normal limits. Appendix appears normal. No evidence of bowel wall thickening, distention, or inflammatory changes. Descending and sigmoid diverticulosis. Vascular/Lymphatic: No significant vascular findings are present. No enlarged abdominal or pelvic lymph nodes. Reproductive: No mass or other significant abnormality. IUD is present in the endometrial cavity. Small bilateral functional ovarian cysts and follicles. Other: No abdominal wall hernia or abnormality. No abdominopelvic ascites. Musculoskeletal: No acute or significant osseous findings. IMPRESSION: 1. No CT findings of the abdomen or pelvis to explain abdominal pain or rectal bleeding. 2. Descending and sigmoid diverticulosis without evidence of acute diverticulitis. 3. IUD is present in the endometrial cavity. Electronically Signed   By: Eddie Candle M.D.   On: 10/26/2020 16:50   DG Hand Complete Left  Result Date: 11/09/2020 CLINICAL DATA:  Initial  evaluation for acute pain status post fall. EXAM: LEFT HAND - COMPLETE 3+ VIEW COMPARISON:  Radiograph from 03/02/2018. FINDINGS: No acute fracture or dislocation. Remotely healed fracture of the left fifth metacarpal noted. Mild scattered osteoarthritic changes noted about the hand. No visible soft tissue injury. IMPRESSION: 1. No acute osseous abnormality about the left hand. 2. Remotely healed fracture of the left fifth metacarpal. Electronically Signed   By: Jeannine Boga M.D.   On: 11/09/2020 23:30    Lab Data:  CBC: Recent Labs  Lab 11/09/20 2210 11/11/20 0500 11/11/20 0513  WBC 11.2*  --  6.4  HGB 6.9* 7.7* 7.4*  HCT 22.6* 25.8* 24.9*  MCV 88.3  --  90.2  PLT 363  --  161   Basic Metabolic Panel: Recent Labs  Lab 11/09/20 2210 11/11/20 0513  NA 140 140  K 3.4* 4.2  CL 110 114*  CO2 22 22  GLUCOSE 76 94  BUN 13 6  CREATININE 1.08* 0.78  CALCIUM 8.8* 8.4*   GFR: Estimated Creatinine Clearance: 87.1 mL/min (by C-G formula based on SCr of 0.78 mg/dL). Liver Function Tests: No results for input(s): AST, ALT, ALKPHOS, BILITOT, PROT, ALBUMIN in the last 168 hours. No results for input(s): LIPASE, AMYLASE in the last 168 hours. No results for input(s): AMMONIA in the last 168 hours. Coagulation Profile: No results for input(s): INR, PROTIME in the last 168 hours. Cardiac Enzymes: No results for input(s): CKTOTAL, CKMB, CKMBINDEX, TROPONINI in the last 168 hours. BNP (last 3 results) No results for input(s): PROBNP in the last 8760 hours. HbA1C: No results for input(s): HGBA1C in the last 72 hours. CBG: Recent Labs  Lab 11/09/20 2214  GLUCAP 83   Lipid Profile: No results for input(s): CHOL, HDL, LDLCALC, TRIG, CHOLHDL, LDLDIRECT in the last 72 hours. Thyroid Function Tests: No results for input(s): TSH, T4TOTAL, FREET4, T3FREE, THYROIDAB in the last 72 hours. Anemia Panel: No results for input(s): VITAMINB12, FOLATE, FERRITIN, TIBC, IRON, RETICCTPCT in the  last 72 hours. Urine analysis:    Component Value Date/Time   COLORURINE YELLOW 11/09/2020 2210   APPEARANCEUR CLEAR 11/09/2020 2210   LABSPEC 1.022 11/09/2020 2210   PHURINE 6.5 11/09/2020 2210   GLUCOSEU NEGATIVE 11/09/2020 2210   HGBUR NEGATIVE 11/09/2020 2210   BILIRUBINUR NEGATIVE 11/09/2020 2210   KETONESUR TRACE (A) 11/09/2020 2210   PROTEINUR 30 (A) 11/09/2020 2210   UROBILINOGEN 0.2 06/17/2011 2018   NITRITE NEGATIVE  11/09/2020 [place] 11/09/2020 2210     Special Ranes M.D. [facility] 11/11/2020, 11:11 AM  Available via Epic secure chat 7am-7pm After 7 pm, please refer to night coverage provider listed on amion.

## 2020-11-11 NOTE — Progress Notes (Signed)
Pt drinking prep for Colonoscopy tomorrow. Pt noted to have results in the [facility] of moderate amount of liquid bright red blood with two very small dark stool pieces.

## 2020-11-11 NOTE — Anesthesia Preprocedure Evaluation (Addendum)
Anesthesia Evaluation  Patient identified by MRN, date of birth, ID band Patient awake    Reviewed: Allergy & Precautions, NPO status , Patient's Chart, lab work & pertinent test results  History of Anesthesia Complications Negative for: history of anesthetic complications  Airway Mallampati: II  TM Distance: >3 FB Neck ROM: Full    Dental no notable dental hx.    Pulmonary asthma , Current Smoker and Patient abstained from smoking.,    Pulmonary exam normal        Cardiovascular negative cardio ROS Normal cardiovascular exam     Neuro/Psych Anxiety Depression Bipolar Disorder    GI/Hepatic Neg liver ROS, hiatal hernia, PUD, GERD  ,Rectal bleeding, UC   Endo/Other  negative endocrine ROS  Renal/GU negative Renal ROS  negative genitourinary   Musculoskeletal negative musculoskeletal ROS (+)   Abdominal   Peds  Hematology  (+) anemia , Hgb 8.6   Anesthesia Other Findings Day of surgery medications reviewed with patient.  Reproductive/Obstetrics negative OB ROS                            Anesthesia Physical Anesthesia Plan  ASA: 3  Anesthesia Plan: MAC   Post-op Pain Management:    Induction:   PONV Risk Score and Plan: Treatment may vary due to age or medical condition and Propofol infusion  Airway Management Planned: Natural Airway and Simple Face Mask  Additional Equipment: None  Intra-op Plan:   Post-operative Plan:   Informed Consent: I have reviewed the patients History and Physical, chart, labs and discussed the procedure including the risks, benefits and alternatives for the proposed anesthesia with the patient or authorized representative who has indicated his/her understanding and acceptance.       Plan Discussed with: CRNA  Anesthesia Plan Comments:        Anesthesia Quick Evaluation

## 2020-11-11 NOTE — Consult Note (Signed)
Referring Provider: [facility] Primary Care Physician:  [name], MD Primary Gastroenterologist:  Althia Forts (Dr. Marius Ditch, [facility])  Reason for Consultation:  Anemia, rectal bleeding  HPI: Victoria Pierce is a 40 y.o. female with past medical history of Nissen fundoplication, chronic rectal bleeding, diverticulosis and asthma presenting for consultation of anemia and rectal bleeding.  Patient reports intermittent bright red blood per rectum since October 2021, but states it worsened since March of this year.  She was admitted with GI bleeding from 7/12-7/13/22 and had a negative bleeding scan.  She was advised to have a colonoscopy, but she left AMA.  She states she typically passes blood with every bowel movement and even passes blood per rectum when urinating.  She underwent banding of one internal hemorrhoid in office on 11/03/20.  She states she had not had as much bleeding post-banding until yesterday, when she had rectal bleeding and pain.  She had a syncopal episode yesterday and thus presented to the ED.  She denies abdominal pain, nausea, vomiting.  Chart notes ulcerative colitis, though it does not appear the patient has a diagnosis of ulcerative colitis per chart review.  Denies family history of colon cancer or gastrointestinal malignancy.  Patient states she uses naproxen on occasion, less than once per week.  Denies aspirin or blood thinner use.  Flex sig 01/2020 for rectal bleeding: Multiple diverticula were found in the sigmoid colon and descending colon. Large non-bleeding external hemorrhoids with stigmata of recent bleeding.  Colonoscopy 04/2018: diverticulosis, otherwise normal  EGD 01/2020: Erosive gastropathy with no bleeding and no stigmata of recent bleeding. A 315 degree fundoplication was found. The wrap appears intact.  Past Medical History:  Diagnosis Date   Anxiety    Asthma    Bipolar 1 disorder ([place])    Depression    Diverticulitis    GERD  (gastroesophageal reflux disease)    Hernia, abdominal    Hiatal hernia    Ulcerative colitis    Vaginal septum     Past Surgical History:  Procedure Laterality Date   CLOSED REDUCTION METACARPAL WITH PERCUTANEOUS PINNING Left 03/07/2018   Procedure: CLOSED REDUCTION METACARPAL WITH PERCUTANEOUS PINNING-5th metacarpal;  Surgeon: Corky Mull, MD;  Location: [facility] ORS;  Service: Orthopedics;  Laterality: Left;   COLONOSCOPY WITH PROPOFOL N/A 05/14/2018   Procedure: COLONOSCOPY WITH PROPOFOL;  Surgeon: Lin Landsman, MD;  Location: [facility] ENDOSCOPY;  Service: Gastroenterology;  Laterality: N/A;   ESOPHAGOGASTRODUODENOSCOPY (EGD) WITH PROPOFOL N/A 05/14/2018   Procedure: ESOPHAGOGASTRODUODENOSCOPY (EGD) WITH PROPOFOL;  Surgeon: Lin Landsman, MD;  Location: [facility] ENDOSCOPY;  Service: Gastroenterology;  Laterality: N/A;   ESOPHAGOGASTRODUODENOSCOPY (EGD) WITH PROPOFOL N/A 03/06/2019   Procedure: ESOPHAGOGASTRODUODENOSCOPY (EGD) WITH PROPOFOL;  Surgeon: Lin Landsman, MD;  Location: [facility] ENDOSCOPY;  Service: Gastroenterology;  Laterality: N/A;   ESOPHAGOGASTRODUODENOSCOPY (EGD) WITH PROPOFOL N/A 02/04/2020   Procedure: ESOPHAGOGASTRODUODENOSCOPY (EGD) WITH PROPOFOL;  Surgeon: Lin Landsman, MD;  Location: [facility] ENDOSCOPY;  Service: Gastroenterology;  Laterality: N/A;   FLEXIBLE SIGMOIDOSCOPY N/A 02/04/2020   Procedure: FLEXIBLE SIGMOIDOSCOPY;  Surgeon: Lin Landsman, MD;  Location: [facility] ENDOSCOPY;  Service: Gastroenterology;  Laterality: N/A;   FRACTURE SURGERY     HARDWARE REMOVAL Left 04/03/2018   Procedure: LEFT HAND FIFTH METACARPAL PIN REMOVAL;  Surgeon: Corky Mull, MD;  Location: [facility];  Service: Orthopedics;  Laterality: Left;   HERNIA REPAIR     ROBOTIC ASSISTED LAPAROSCOPIC NISSEN FUNDOPLICATION N/A 4/00/8676   Procedure: ROBOTIC ASSISTED LAPAROSCOPIC NISSEN FUNDOPLICATION;  Surgeon: Jules Husbands, MD;  Location: [facility] ORS;  Service: General;   Laterality: N/A;   TRIGGER FINGER RELEASE Left 06/19/2018   Procedure: EXTENSOR TENOLYSIS WITH CAPSULAR RELEASE OF LEFT LITTLE MCP JOINT;  Surgeon: Corky Mull, MD;  Location: [facility];  Service: Orthopedics;  Laterality: Left;   TUBAL LIGATION      Prior to Admission medications   Medication Sig Start Date End Date Taking? Authorizing Provider  budesonide-formoterol (SYMBICORT) 160-4.5 MCG/ACT inhaler Inhale 2 puffs into the lungs in the morning and at bedtime. 08/19/20  Yes Chesley Mires, MD  cyclobenzaprine (FLEXERIL) 10 MG tablet Take 10 mg by mouth 3 (three) times daily as needed for muscle spasms.    Yes [provider]  gabapentin (NEURONTIN) 600 MG tablet Take 600 mg by mouth 3 (three) times daily. 06/04/20  Yes [provider]  hydrocortisone (ANUSOL-HC) 2.5 % rectal cream Place 1 application rectally 2 (two) times daily. 08/10/20  Yes Vanga, Tally Due, MD  lamoTRIgine (LAMICTAL) 150 MG tablet Take 200 mg by mouth in the morning. 03/09/20  Yes [provider]  naproxen (NAPROSYN) 500 MG tablet Take 500 mg by mouth 2 (two) times daily. 02/14/20  Yes [provider]  ondansetron (ZOFRAN-ODT) 4 MG disintegrating tablet Take 1 tablet (4 mg total) by mouth every 6 (six) hours as needed for nausea or vomiting. 09/27/18  Yes Tylene Fantasia, PA-C  propranolol (INDERAL) 20 MG tablet Take 20 mg by mouth 4 (four) times daily. 06/16/19  Yes [provider]  QUEtiapine (SEROQUEL) 50 MG tablet Take 150 mg by mouth at bedtime. 05/25/20  Yes [provider]  topiramate (TOPAMAX) 50 MG tablet Take 50 mg by mouth 2 (two) times daily.   Yes [provider]  albuterol (VENTOLIN HFA) 108 (90 Base) MCG/ACT inhaler Inhale 2 puffs into the lungs every 6 (six) hours as needed for wheezing or shortness of breath. Patient not taking: No sig reported 08/19/20   Chesley Mires, MD    Scheduled Meds:  sodium chloride   Intravenous Once   gabapentin   900 mg Oral BID   lamoTRIgine  200 mg Oral Daily   lisdexamfetamine  70 mg Oral q morning   polyethylene glycol-electrolytes  4,000 mL Oral Once   propranolol  20 mg Oral BID   QUEtiapine  150 mg Oral QHS   topiramate  50 mg Oral BID   Continuous Infusions:  sodium chloride 100 mL/hr at 11/11/20 0732   ferumoxytol     PRN Meds:.  Allergies as of 11/09/2020   (No Known Allergies)    Family History  Problem Relation Age of Onset   Hypertension Sister    Hyperlipidemia Mother    Hypertension Mother    Hypertension Father    Hyperlipidemia Father     Social History   Socioeconomic History   Marital status: Single    Spouse name: Not on file   Number of children: Not on file   Years of education: Not on file   Highest education level: Not on file  Occupational History   Not on file  Tobacco Use   Smoking status: Every Day    Packs/day: 1.00    Years: 27.00    Pack years: 27.00    Types: Cigarettes   Smokeless tobacco: Never   Tobacco comments:    0.5ppd 08/19/2020  Vaping Use   Vaping Use: Never used  Substance and Sexual Activity   Alcohol use: No  Drug use: Yes    Types: Marijuana    Comment: used Monday   Sexual activity: Not Currently    Birth control/protection: I.U.D., Surgical    Comment: Mirena  Other Topics Concern   Not on file  Social History Narrative   Not on file   Social Determinants of Health   Financial Resource Strain: Not on file  Food Insecurity: Not on file  Transportation Needs: Not on file  Physical Activity: Not on file  Stress: Not on file  Social Connections: Not on file  Intimate Partner Violence: Not on file    Review of Systems: Review of Systems  Constitutional:  Negative for chills and fever.  HENT:  Negative for hearing loss and tinnitus.   Eyes:  Negative for pain and redness.  Respiratory:  Negative for cough and shortness of breath.   Cardiovascular:  Negative for chest pain and palpitations.   Gastrointestinal:  Positive for blood in stool and diarrhea. Negative for abdominal pain, constipation, heartburn, melena, nausea and vomiting.  Genitourinary:  Negative for flank pain and hematuria.  Musculoskeletal:  Negative for falls and joint pain.  Skin:  Negative for itching and rash.  Neurological:  Negative for seizures and loss of consciousness.  Endo/Heme/Allergies:  Negative for polydipsia. Does not bruise/bleed easily.  Psychiatric/Behavioral:  Negative for substance abuse. The patient is not nervous/anxious.     Physical Exam: Vital signs: Vitals:   11/11/20 0958 11/11/20 1025  BP: 122/69 117/84  Pulse: 79 79  Resp: 16 16  Temp: 98 F (36.7 C) 98.2 F (36.8 C)  SpO2: 100% 100%   Last BM Date: 11/10/20  Physical Exam Vitals reviewed.  Constitutional:      General: She is not in acute distress. HENT:     Head: Normocephalic and atraumatic.     Nose: Nose normal. No congestion.     Mouth/Throat:     Mouth: Mucous membranes are moist.     Pharynx: Oropharynx is clear.  Eyes:     Extraocular Movements: Extraocular movements intact.     Comments: Conjunctival pallor  Cardiovascular:     Rate and Rhythm: Normal rate and regular rhythm.  Pulmonary:     Effort: Pulmonary effort is normal. No respiratory distress.  Abdominal:     General: Bowel sounds are normal. There is no distension.     Palpations: Abdomen is soft. There is no mass.     Tenderness: There is no abdominal tenderness. There is no guarding or rebound.     Hernia: No hernia is present.  Musculoskeletal:        General: No swelling or tenderness.     Cervical back: Normal range of motion and neck supple.  Skin:    General: Skin is warm and dry.  Neurological:     General: No focal deficit present.     Mental Status: She is alert and oriented to person, place, and time.  Psychiatric:        Mood and Affect: Mood normal.        Behavior: Behavior normal. Behavior is cooperative.     GI:   Lab Results: Recent Labs    11/09/20 2210 11/11/20 0500 11/11/20 0513  WBC 11.2*  --  6.4  HGB 6.9* 7.7* 7.4*  HCT 22.6* 25.8* 24.9*  PLT 363  --  277    BMET Recent Labs    11/09/20 2210 11/11/20 0513  NA 140 140  K 3.4* 4.2  CL 110 114*  CO2 22 22  GLUCOSE 76 94  BUN 13 6  CREATININE 1.08* 0.78  CALCIUM 8.8* 8.4*    LFT No results for input(s): PROT, ALBUMIN, AST, ALT, ALKPHOS, BILITOT, BILIDIR, IBILI in the last 72 hours.  PT/INR No results for input(s): LABPROT, INR in the last 72 hours.    Studies/Results: CT Head Wo Contrast  Result Date: 11/09/2020 CLINICAL DATA:  40 year old female with trauma. EXAM: CT HEAD WITHOUT CONTRAST TECHNIQUE: Contiguous axial images were obtained from the base of the skull through the vertex without intravenous contrast. COMPARISON:  None. FINDINGS: Brain: The ventricles and sulci appropriate size for patient's age. The gray-white matter discrimination is preserved. There is no acute intracranial hemorrhage. No mass effect or midline shift. No extra-axial fluid collection. Vascular: No hyperdense vessel or unexpected calcification. Skull: Normal. Negative for fracture or focal lesion. Sinuses/Orbits: No acute finding. Other: None IMPRESSION: Unremarkable noncontrast CT of the brain. Electronically Signed   By: Anner Crete M.D.   On: 11/09/2020 23:26   DG Hand Complete Left  Result Date: 11/09/2020 CLINICAL DATA:  Initial evaluation for acute pain status post fall. EXAM: LEFT HAND - COMPLETE 3+ VIEW COMPARISON:  Radiograph from 03/02/2018. FINDINGS: No acute fracture or dislocation. Remotely healed fracture of the left fifth metacarpal noted. Mild scattered osteoarthritic changes noted about the hand. No visible soft tissue injury. IMPRESSION: 1. No acute osseous abnormality about the left hand. 2. Remotely healed fracture of the left fifth metacarpal. Electronically Signed   By: Jeannine Boga M.D.   On: 11/09/2020 23:30     Impression: Rectal bleeding: Hemorrhoidal vs. Diverticular.  -Hgb 6.9 on arrival yesterday, decreased from 7.6 on 7/20. -History of hemorrhoidal bleeding, flex sig in 01/2020 showed external hemorrhoids with stigmata of bleeding.  Colonoscopy in 2020 pertinent only for diverticulosis.  Recent internal hemorrhoidal banding x 1 on 11/03/20. -Normal BUN/Cr  History of Nissen Fundoplication  Plan: Colonoscopy tomorrow for further evaluation.  I thoroughly discussed the procedure with the patient to include nature, alternatives, benefits, and risks (including but not limited to bleeding, infection, perforation, anesthesia/cardiac and pulmonary complications).  Patient verbalized understanding and gave verbal consent to proceed with colonoscopy.  Clear liquid diet.  Nulytely prep.  NPO after midnight.  Continue to monitor H&H with transfusion as needed to maintain Hgb >7.  [facility] will follow.    LOS: 0 days   Salley Slaughter  PA-C 11/11/2020, 10:33 AM  Contact #  [phone]

## 2020-11-11 NOTE — Progress Notes (Signed)
Pt continues bowel prep for Colonoscopy tomorrow. Pt's last result was a moderate amount of liquid stool brown in color with little pieces of stool noted throughout. No frank blood noted in this result.

## 2020-11-11 NOTE — H&P (View-Only) (Signed)
Referring Provider: [facility] Primary Care Physician:  [name], MD Primary Gastroenterologist:  Althia Forts (Dr. Marius Ditch, [facility])  Reason for Consultation:  Anemia, rectal bleeding  HPI: Victoria Pierce is a 40 y.o. female with past medical history of Nissen fundoplication, chronic rectal bleeding, diverticulosis and asthma presenting for consultation of anemia and rectal bleeding.  Patient reports intermittent bright red blood per rectum since October 2021, but states it worsened since March of this year.  She was admitted with GI bleeding from 7/12-7/13/22 and had a negative bleeding scan.  She was advised to have a colonoscopy, but she left AMA.  She states she typically passes blood with every bowel movement and even passes blood per rectum when urinating.  She underwent banding of one internal hemorrhoid in office on 11/03/20.  She states she had not had as much bleeding post-banding until yesterday, when she had rectal bleeding and pain.  She had a syncopal episode yesterday and thus presented to the ED.  She denies abdominal pain, nausea, vomiting.  Chart notes ulcerative colitis, though it does not appear the patient has a diagnosis of ulcerative colitis per chart review.  Denies family history of colon cancer or gastrointestinal malignancy.  Patient states she uses naproxen on occasion, less than once per week.  Denies aspirin or blood thinner use.  Flex sig 01/2020 for rectal bleeding: Multiple diverticula were found in the sigmoid colon and descending colon. Large non-bleeding external hemorrhoids with stigmata of recent bleeding.  Colonoscopy 04/2018: diverticulosis, otherwise normal  EGD 01/2020: Erosive gastropathy with no bleeding and no stigmata of recent bleeding. A 401 degree fundoplication was found. The wrap appears intact.  Past Medical History:  Diagnosis Date   Anxiety    Asthma    Bipolar 1 disorder ([place])    Depression    Diverticulitis    GERD  (gastroesophageal reflux disease)    Hernia, abdominal    Hiatal hernia    Ulcerative colitis    Vaginal septum     Past Surgical History:  Procedure Laterality Date   CLOSED REDUCTION METACARPAL WITH PERCUTANEOUS PINNING Left 03/07/2018   Procedure: CLOSED REDUCTION METACARPAL WITH PERCUTANEOUS PINNING-5th metacarpal;  Surgeon: Corky Mull, MD;  Location: [facility] ORS;  Service: Orthopedics;  Laterality: Left;   COLONOSCOPY WITH PROPOFOL N/A 05/14/2018   Procedure: COLONOSCOPY WITH PROPOFOL;  Surgeon: Lin Landsman, MD;  Location: [facility] ENDOSCOPY;  Service: Gastroenterology;  Laterality: N/A;   ESOPHAGOGASTRODUODENOSCOPY (EGD) WITH PROPOFOL N/A 05/14/2018   Procedure: ESOPHAGOGASTRODUODENOSCOPY (EGD) WITH PROPOFOL;  Surgeon: Lin Landsman, MD;  Location: [facility] ENDOSCOPY;  Service: Gastroenterology;  Laterality: N/A;   ESOPHAGOGASTRODUODENOSCOPY (EGD) WITH PROPOFOL N/A 03/06/2019   Procedure: ESOPHAGOGASTRODUODENOSCOPY (EGD) WITH PROPOFOL;  Surgeon: Lin Landsman, MD;  Location: [facility] ENDOSCOPY;  Service: Gastroenterology;  Laterality: N/A;   ESOPHAGOGASTRODUODENOSCOPY (EGD) WITH PROPOFOL N/A 02/04/2020   Procedure: ESOPHAGOGASTRODUODENOSCOPY (EGD) WITH PROPOFOL;  Surgeon: Lin Landsman, MD;  Location: [facility] ENDOSCOPY;  Service: Gastroenterology;  Laterality: N/A;   FLEXIBLE SIGMOIDOSCOPY N/A 02/04/2020   Procedure: FLEXIBLE SIGMOIDOSCOPY;  Surgeon: Lin Landsman, MD;  Location: [facility] ENDOSCOPY;  Service: Gastroenterology;  Laterality: N/A;   FRACTURE SURGERY     HARDWARE REMOVAL Left 04/03/2018   Procedure: LEFT HAND FIFTH METACARPAL PIN REMOVAL;  Surgeon: Corky Mull, MD;  Location: [place];  Service: Orthopedics;  Laterality: Left;   HERNIA REPAIR     ROBOTIC ASSISTED LAPAROSCOPIC NISSEN FUNDOPLICATION N/A 0/27/2536   Procedure: ROBOTIC ASSISTED LAPAROSCOPIC NISSEN FUNDOPLICATION;  Surgeon: Jules Husbands, MD;  Location: [facility] ORS;  Service: General;   Laterality: N/A;   TRIGGER FINGER RELEASE Left 06/19/2018   Procedure: EXTENSOR TENOLYSIS WITH CAPSULAR RELEASE OF LEFT LITTLE MCP JOINT;  Surgeon: Corky Mull, MD;  Location: [place];  Service: Orthopedics;  Laterality: Left;   TUBAL LIGATION      Prior to Admission medications   Medication Sig Start Date End Date Taking? Authorizing Provider  budesonide-formoterol (SYMBICORT) 160-4.5 MCG/ACT inhaler Inhale 2 puffs into the lungs in the morning and at bedtime. 08/19/20  Yes Chesley Mires, MD  cyclobenzaprine (FLEXERIL) 10 MG tablet Take 10 mg by mouth 3 (three) times daily as needed for muscle spasms.    Yes [provider]  gabapentin (NEURONTIN) 600 MG tablet Take 600 mg by mouth 3 (three) times daily. 06/04/20  Yes [provider]  hydrocortisone (ANUSOL-HC) 2.5 % rectal cream Place 1 application rectally 2 (two) times daily. 08/10/20  Yes Vanga, Tally Due, MD  lamoTRIgine (LAMICTAL) 150 MG tablet Take 200 mg by mouth in the morning. 03/09/20  Yes [provider]  naproxen (NAPROSYN) 500 MG tablet Take 500 mg by mouth 2 (two) times daily. 02/14/20  Yes [provider]  ondansetron (ZOFRAN-ODT) 4 MG disintegrating tablet Take 1 tablet (4 mg total) by mouth every 6 (six) hours as needed for nausea or vomiting. 09/27/18  Yes Tylene Fantasia, PA-C  propranolol (INDERAL) 20 MG tablet Take 20 mg by mouth 4 (four) times daily. 06/16/19  Yes [provider]  QUEtiapine (SEROQUEL) 50 MG tablet Take 150 mg by mouth at bedtime. 05/25/20  Yes [provider]  topiramate (TOPAMAX) 50 MG tablet Take 50 mg by mouth 2 (two) times daily.   Yes [provider]  albuterol (VENTOLIN HFA) 108 (90 Base) MCG/ACT inhaler Inhale 2 puffs into the lungs every 6 (six) hours as needed for wheezing or shortness of breath. Patient not taking: No sig reported 08/19/20   Chesley Mires, MD    Scheduled Meds:  sodium chloride   Intravenous Once   gabapentin   900 mg Oral BID   lamoTRIgine  200 mg Oral Daily   lisdexamfetamine  70 mg Oral q morning   polyethylene glycol-electrolytes  4,000 mL Oral Once   propranolol  20 mg Oral BID   QUEtiapine  150 mg Oral QHS   topiramate  50 mg Oral BID   Continuous Infusions:  sodium chloride 100 mL/hr at 11/11/20 0732   ferumoxytol     PRN Meds:.  Allergies as of 11/09/2020   (No Known Allergies)    Family History  Problem Relation Age of Onset   Hypertension Sister    Hyperlipidemia Mother    Hypertension Mother    Hypertension Father    Hyperlipidemia Father     Social History   Socioeconomic History   Marital status: Single    Spouse name: Not on file   Number of children: Not on file   Years of education: Not on file   Highest education level: Not on file  Occupational History   Not on file  Tobacco Use   Smoking status: Every Day    Packs/day: 1.00    Years: 27.00    Pack years: 27.00    Types: Cigarettes   Smokeless tobacco: Never   Tobacco comments:    0.5ppd 08/19/2020  Vaping Use   Vaping Use: Never used  Substance and Sexual Activity   Alcohol use: No  Drug use: Yes    Types: Marijuana    Comment: used Monday   Sexual activity: Not Currently    Birth control/protection: I.U.D., Surgical    Comment: Mirena  Other Topics Concern   Not on file  Social History Narrative   Not on file   Social Determinants of Health   Financial Resource Strain: Not on file  Food Insecurity: Not on file  Transportation Needs: Not on file  Physical Activity: Not on file  Stress: Not on file  Social Connections: Not on file  Intimate Partner Violence: Not on file    Review of Systems: Review of Systems  Constitutional:  Negative for chills and fever.  HENT:  Negative for hearing loss and tinnitus.   Eyes:  Negative for pain and redness.  Respiratory:  Negative for cough and shortness of breath.   Cardiovascular:  Negative for chest pain and palpitations.   Gastrointestinal:  Positive for blood in stool and diarrhea. Negative for abdominal pain, constipation, heartburn, melena, nausea and vomiting.  Genitourinary:  Negative for flank pain and hematuria.  Musculoskeletal:  Negative for falls and joint pain.  Skin:  Negative for itching and rash.  Neurological:  Negative for seizures and loss of consciousness.  Endo/Heme/Allergies:  Negative for polydipsia. Does not bruise/bleed easily.  Psychiatric/Behavioral:  Negative for substance abuse. The patient is not nervous/anxious.     Physical Exam: Vital signs: Vitals:   11/11/20 0958 11/11/20 1025  BP: 122/69 117/84  Pulse: 79 79  Resp: 16 16  Temp: 98 F (36.7 C) 98.2 F (36.8 C)  SpO2: 100% 100%   Last BM Date: 11/10/20  Physical Exam Vitals reviewed.  Constitutional:      General: She is not in acute distress. HENT:     Head: Normocephalic and atraumatic.     Nose: Nose normal. No congestion.     Mouth/Throat:     Mouth: Mucous membranes are moist.     Pharynx: Oropharynx is clear.  Eyes:     Extraocular Movements: Extraocular movements intact.     Comments: Conjunctival pallor  Cardiovascular:     Rate and Rhythm: Normal rate and regular rhythm.  Pulmonary:     Effort: Pulmonary effort is normal. No respiratory distress.  Abdominal:     General: Bowel sounds are normal. There is no distension.     Palpations: Abdomen is soft. There is no mass.     Tenderness: There is no abdominal tenderness. There is no guarding or rebound.     Hernia: No hernia is present.  Musculoskeletal:        General: No swelling or tenderness.     Cervical back: Normal range of motion and neck supple.  Skin:    General: Skin is warm and dry.  Neurological:     General: No focal deficit present.     Mental Status: She is alert and oriented to person, place, and time.  Psychiatric:        Mood and Affect: Mood normal.        Behavior: Behavior normal. Behavior is cooperative.     GI:   Lab Results: Recent Labs    11/09/20 2210 11/11/20 0500 11/11/20 0513  WBC 11.2*  --  6.4  HGB 6.9* 7.7* 7.4*  HCT 22.6* 25.8* 24.9*  PLT 363  --  277    BMET Recent Labs    11/09/20 2210 11/11/20 0513  NA 140 140  K 3.4* 4.2  CL 110 114*  CO2 22 22  GLUCOSE 76 94  BUN 13 6  CREATININE 1.08* 0.78  CALCIUM 8.8* 8.4*    LFT No results for input(s): PROT, ALBUMIN, AST, ALT, ALKPHOS, BILITOT, BILIDIR, IBILI in the last 72 hours.  PT/INR No results for input(s): LABPROT, INR in the last 72 hours.    Studies/Results: CT Head Wo Contrast  Result Date: 11/09/2020 CLINICAL DATA:  40 year old female with trauma. EXAM: CT HEAD WITHOUT CONTRAST TECHNIQUE: Contiguous axial images were obtained from the base of the skull through the vertex without intravenous contrast. COMPARISON:  None. FINDINGS: Brain: The ventricles and sulci appropriate size for patient's age. The gray-white matter discrimination is preserved. There is no acute intracranial hemorrhage. No mass effect or midline shift. No extra-axial fluid collection. Vascular: No hyperdense vessel or unexpected calcification. Skull: Normal. Negative for fracture or focal lesion. Sinuses/Orbits: No acute finding. Other: None IMPRESSION: Unremarkable noncontrast CT of the brain. Electronically Signed   By: Anner Crete M.D.   On: 11/09/2020 23:26   DG Hand Complete Left  Result Date: 11/09/2020 CLINICAL DATA:  Initial evaluation for acute pain status post fall. EXAM: LEFT HAND - COMPLETE 3+ VIEW COMPARISON:  Radiograph from 03/02/2018. FINDINGS: No acute fracture or dislocation. Remotely healed fracture of the left fifth metacarpal noted. Mild scattered osteoarthritic changes noted about the hand. No visible soft tissue injury. IMPRESSION: 1. No acute osseous abnormality about the left hand. 2. Remotely healed fracture of the left fifth metacarpal. Electronically Signed   By: Jeannine Boga M.D.   On: 11/09/2020 23:30     Impression: Rectal bleeding: Hemorrhoidal vs. Diverticular.  -Hgb 6.9 on arrival yesterday, decreased from 7.6 on 7/20. -History of hemorrhoidal bleeding, flex sig in 01/2020 showed external hemorrhoids with stigmata of bleeding.  Colonoscopy in 2020 pertinent only for diverticulosis.  Recent internal hemorrhoidal banding x 1 on 11/03/20. -Normal BUN/Cr  History of Nissen Fundoplication  Plan: Colonoscopy tomorrow for further evaluation.  I thoroughly discussed the procedure with the patient to include nature, alternatives, benefits, and risks (including but not limited to bleeding, infection, perforation, anesthesia/cardiac and pulmonary complications).  Patient verbalized understanding and gave verbal consent to proceed with colonoscopy.  Clear liquid diet.  Nulytely prep.  NPO after midnight.  Continue to monitor H&H with transfusion as needed to maintain Hgb >7.  [facility] will follow.    LOS: 0 days   Salley Slaughter  PA-C 11/11/2020, 10:33 AM  Contact #  [phone]

## 2020-11-12 ENCOUNTER — Encounter (HOSPITAL_COMMUNITY)
Admission: EM | Disposition: A | Discharge status: Home / Self Care | Payer: Self-pay | Source: Home / Self Care | Attending: Internal Medicine

## 2020-11-12 ENCOUNTER — Inpatient Hospital Stay (HOSPITAL_COMMUNITY): Payer: Medicaid Other | Admitting: Anesthesiology

## 2020-11-12 ENCOUNTER — Encounter (HOSPITAL_COMMUNITY): Payer: Self-pay | Admitting: Internal Medicine

## 2020-11-12 DIAGNOSIS — K922 Gastrointestinal hemorrhage, unspecified: Secondary | ICD-10-CM

## 2020-11-12 HISTORY — PX: COLONOSCOPY: SHX5424

## 2020-11-12 LAB — TYPE AND SCREEN
ABO/RH(D): A NEG
Antibody Screen: NEGATIVE
Unit division: 0
Unit division: 0

## 2020-11-12 LAB — BPAM RBC
Blood Product Expiration Date: 202208192359
Blood Product Expiration Date: 202208252359
ISSUE DATE / TIME: 202207271237
ISSUE DATE / TIME: 202207281007
Unit Type and Rh: 600
Unit Type and Rh: 600

## 2020-11-12 LAB — CBC
HCT: 28.9 % — ABNORMAL LOW (ref 36.0–46.0)
Hemoglobin: 8.6 g/dL — ABNORMAL LOW (ref 12.0–15.0)
MCH: 27 pg (ref 26.0–34.0)
MCHC: 29.8 g/dL — ABNORMAL LOW (ref 30.0–36.0)
MCV: 90.9 fL (ref 80.0–100.0)
Platelets: 268 10*3/uL (ref 150–400)
RBC: 3.18 MIL/uL — ABNORMAL LOW (ref 3.87–5.11)
RDW: 16.5 % — ABNORMAL HIGH (ref 11.5–15.5)
WBC: 7 10*3/uL (ref 4.0–10.5)
nRBC: 0 % (ref 0.0–0.2)

## 2020-11-12 SURGERY — COLONOSCOPY
Anesthesia: Monitor Anesthesia Care

## 2020-11-12 MED ORDER — PROPOFOL 500 MG/50ML IV EMUL
INTRAVENOUS | Status: DC | PRN
  Administered 2020-11-12: 125 ug/kg/min via INTRAVENOUS

## 2020-11-12 MED ORDER — HYDROCORTISONE ACETATE 25 MG RE SUPP
25.0000 mg | Freq: Two times a day (BID) | RECTAL | Status: DC
  Administered 2020-11-12 – 2020-11-13 (×3): 25 mg via RECTAL
  Filled 2020-11-12 (×3): qty 1

## 2020-11-12 MED ORDER — POLYETHYLENE GLYCOL 3350 17 G PO PACK
17.0000 g | PACK | Freq: Every day | ORAL | Status: DC
  Administered 2020-11-13: 17 g via ORAL
  Filled 2020-11-12 (×2): qty 1

## 2020-11-12 MED ORDER — LORAZEPAM 0.5 MG PO TABS
0.5000 mg | ORAL_TABLET | Freq: Two times a day (BID) | ORAL | Status: DC | PRN
  Administered 2020-11-12: 0.5 mg via ORAL
  Filled 2020-11-12: qty 1

## 2020-11-12 MED ORDER — LACTATED RINGERS IV SOLN: INTRAVENOUS | Status: DC | PRN

## 2020-11-12 NOTE — Progress Notes (Signed)
Shortly after returning from colonoscopy pt had an bright red BM. Pt AxOx4, sitting up in bed. VVS. MD notified.

## 2020-11-12 NOTE — Interval H&P Note (Signed)
History and Physical Interval Note:  11/12/2020 10:02 AM  Victoria Pierce  has presented today for surgery, with the diagnosis of rectal bleeding.  The various methods of treatment have been discussed with the patient and family. After consideration of risks, benefits and other options for treatment, the patient has consented to  Procedure(s): COLONOSCOPY (N/A) as a surgical intervention.  The patient's history has been reviewed, patient examined, no change in status, stable for surgery.  I have reviewed the patient's chart and labs.  Questions were answered to the patient's satisfaction.     Landry Dyke

## 2020-11-12 NOTE — Progress Notes (Signed)
[facility]                                                                              Patient Demographics  Victoria Pierce, is a 40 y.o. female, DOB - 28-Oct-1980, OIN:[id number]  Admit date - 11/09/2020   Admitting Physician Collier Bullock, MD  Outpatient Primary MD for the patient is [name], MD  Outpatient specialists:   LOS - 1  days   Medical records reviewed and are as summarized below:    Chief Complaint  Patient presents with   Loss of Consciousness       Brief summary   Patient is a 40 year old female with ulcerative colitis, diverticulitis, diverticulosis, asthma, hemorrhoids, nicotine dependence presented after a witnessed syncopal episode.  Patient reported that she was at a laundromat and was getting something out of the dryer when she felt dizzy and lightheaded, passed out hitting her head on the floor.  She landed on the right side of her face, subsequently complained about pain around her right side of the jaw.  Also reported 2 prior episodes over the last couple of days. Patient was recently admitted at [facility], on 10/26/2020 however left AMA on 7/13.  She was admitted with GI bleed.  She had received blood transfusion for hemoglobin of 6.2.  Tagged RBC scan did not reveal active GI bleeding but showed colonic diverticulosis.  Colonoscopy was recommended. She did follow-up with her GI at [facility] and had banding of the internal hemorrhoid last week.  In ED CT head showed no acute findings, x-ray of left hand showed no acute fracture.  Hemoglobin down to 6.9  Assessment & Plan    Principal Problem:   Symptomatic anemia, acute blood loss anemia, lower GI bleed, iron deficiency anemia -Status post 2 units packed RBCs, hemoglobin 8.6 today -Colonoscopy showed ulceration from recent hemorrhoidal banding likely the cause of hematochezia.  Diverticulosis in the sigmoid colon, descending colon and transverse colon. -Soft diet with bowel  regimen, MiraLAX 17 g daily, Benefiber 1 tablespoon daily. -Continue Anusol suppositories twice daily (patient reported that she was not able to afford them) -Soon after returning from colonoscopy, patient had a bright red bleeding episode, now anxious about going home today.  Will monitor today.  Discussed with GI, Dr. Paulita Fujita, recommended to call surgery if she has severe bleeding.  Active Problems: Iron deficiency anemia -Anemia panel showed severe iron deficiency anemia, ferritin 4, iron saturation 3, Fe 12.  B12, folate WNL,  -Status post 1 unit IV Feraheme transfusion on 7/20    Chronic GERD -Continue Protonix 40 mg daily -Has been taking naproxen PRN for headaches, counseled on stopping NSAIDs     Anxiety, history of bipolar disorder -Resumed all psych meds including Seroquel, Inderal, Lamictal, Vyvanse, gabapentin -Takes propranolol 20 mg qid, continue propranolol 20 mg twice daily for now until BP stable    Tobacco use disorder -Counseled on smoking cessation    Syncope and collapse -Likely due to #1, overall improving    Hypokalemia -Resolved  History of migraines -Continue Topamax   Code Status: FULL DVT Prophylaxis:  SCDs Start: 11/10/20 7096  Level of Care: Level of care: Telemetry Family Communication: Discussed all imaging results, lab results, explained to the patient    Disposition Plan:     Status is: Observation  The patient remains OBS appropriate and will d/c before 2 midnights.  Dispo: The patient is from: Home              Anticipated d/c is to: Home              Patient currently is not medically stable to d/c.  Plan was to discharge home today however after returning from colonoscopy patient had a BRBPR and now apprehensive about going home today and continuing to bleed.  Will monitor overnight.   Difficult to place patient No      Time Spent in minutes   35 minutes  Procedures:  None  Consultants:   Gastroenterology  Antimicrobials:    Anti-infectives (From admission, onward)    None          Medications  Scheduled Meds:  gabapentin  900 mg Oral BID   hydrocortisone  25 mg Rectal BID   lamoTRIgine  200 mg Oral Daily   lisdexamfetamine  70 mg Oral q morning   [START ON 11/13/2020] polyethylene glycol  17 g Oral Daily   propranolol  20 mg Oral BID   QUEtiapine  150 mg Oral QHS   topiramate  50 mg Oral BID   Continuous Infusions:  sodium chloride 100 mL/hr at 11/12/20 0627   PRN Meds:.cyclobenzaprine, HYDROcodone-acetaminophen, ondansetron **OR** ondansetron (ZOFRAN) IV      Subjective:   Zamyra Allensworth was seen and examined today.  Colonoscopy today, return back with 1 episode of BRBPR no abdominal pain nausea or vomiting.  No fevers.  No dizziness lightheadedness, chest pain or shortness of breath.. Objective:   Vitals:   11/12/20 1054 11/12/20 1104 11/12/20 1114 11/12/20 1141  BP: 116/90 122/88 (!) 132/93 114/76  Pulse: 90 80 79 79  Resp: (!) 24 15 16 16   Temp:    98.3 F (36.8 C)  TempSrc:    Oral  SpO2: 100% 100% 100% 100%  Weight:      Height:        Intake/Output Summary (Last 24 hours) at 11/12/2020 1256 Last data filed at 11/12/2020 1036 Gross per 24 hour  Intake 3056.96 ml  Output --  Net 3056.96 ml     Wt Readings from Last 3 Encounters:  11/10/20 69 kg  11/03/20 70.4 kg  10/26/20 68 kg    Physical Exam General: Alert and oriented x 3, NAD Cardiovascular: S1 S2 clear, RRR. No pedal edema b/l Respiratory: CTAB, no wheezing, rales or rhonchi Gastrointestinal: Soft, nontender, nondistended, NBS Ext: no pedal edema bilaterally Neuro: no new deficits Skin: No rashes Psych: anxious   Data Reviewed:  I have personally reviewed following labs and imaging studies  Micro Results Recent Results (from the past 240 hour(s))  Resp Panel by RT-PCR (Flu A&B, Covid) Nasopharyngeal Swab     Status: None   Collection Time: 11/10/20 12:25 AM   Specimen: Nasopharyngeal Swab;  Nasopharyngeal(NP) swabs in vial transport medium  Result Value Ref Range Status   SARS Coronavirus 2 by RT PCR NEGATIVE NEGATIVE Final    Comment: (NOTE) SARS-CoV-2 target nucleic acids are NOT DETECTED.  The SARS-CoV-2 RNA is generally detectable in upper respiratory specimens during the acute phase of infection. The lowest concentration of SARS-CoV-2 viral copies this assay can detect is 138 copies/mL. A negative result does  not preclude SARS-Cov-2 infection and should not be used as the sole basis for treatment or other patient management decisions. A negative result may occur with  improper specimen collection/handling, submission of specimen other than nasopharyngeal swab, presence of viral mutation(s) within the areas targeted by this assay, and inadequate number of viral copies(<138 copies/mL). A negative result must be combined with clinical observations, patient history, and epidemiological information. The expected result is Negative.  Fact Sheet for Patients:  EntrepreneurPulse.com.au  Fact Sheet for Healthcare Providers:  IncredibleEmployment.be  This test is no t yet approved or cleared by the [place] FDA and  has been authorized for detection and/or diagnosis of SARS-CoV-2 by FDA under an Emergency Use Authorization (EUA). This EUA will remain  in effect (meaning this test can be used) for the duration of the COVID-19 declaration under Section 564(b)(1) of the Act, 21 U.S.C.section 360bbb-3(b)(1), unless the authorization is terminated  or revoked sooner.       Influenza A by PCR NEGATIVE NEGATIVE Final   Influenza B by PCR NEGATIVE NEGATIVE Final    Comment: (NOTE) The Xpert Xpress SARS-CoV-2/FLU/RSV plus assay is intended as an aid in the diagnosis of influenza from Nasopharyngeal swab specimens and should not be used as a sole basis for treatment. Nasal washings and aspirates are unacceptable for Xpert Xpress  SARS-CoV-2/FLU/RSV testing.  Fact Sheet for Patients: EntrepreneurPulse.com.au  Fact Sheet for Healthcare Providers: IncredibleEmployment.be  This test is not yet approved or cleared by the [place] FDA and has been authorized for detection and/or diagnosis of SARS-CoV-2 by FDA under an Emergency Use Authorization (EUA). This EUA will remain in effect (meaning this test can be used) for the duration of the COVID-19 declaration under Section 564(b)(1) of the Act, 21 U.S.C. section 360bbb-3(b)(1), unless the authorization is terminated or revoked.  Performed at [facility], [address] [place], [place] [address]     Radiology Reports CT Head Wo Contrast  Result Date: 11/09/2020 CLINICAL DATA:  40 year old female with trauma. EXAM: CT HEAD WITHOUT CONTRAST TECHNIQUE: Contiguous axial images were obtained from the base of the skull through the vertex without intravenous contrast. COMPARISON:  None. FINDINGS: Brain: The ventricles and sulci appropriate size for patient's age. The gray-white matter discrimination is preserved. There is no acute intracranial hemorrhage. No mass effect or midline shift. No extra-axial fluid collection. Vascular: No hyperdense vessel or unexpected calcification. Skull: Normal. Negative for fracture or focal lesion. Sinuses/Orbits: No acute finding. Other: None IMPRESSION: Unremarkable noncontrast CT of the brain. Electronically Signed   By: Anner Crete M.D.   On: 11/09/2020 23:26   NM GI Blood Loss  Result Date: 10/27/2020 CLINICAL DATA:  Lower GI bleed for a couple months. Last episode earlier today. EXAM: NUCLEAR MEDICINE GASTROINTESTINAL BLEEDING SCAN TECHNIQUE: Sequential abdominal images were obtained following intravenous administration of Tc-50mlabeled red blood cells. RADIOPHARMACEUTICALS:  20.5 mCi Tc-946mertechnetate in-vitro labeled red cells. COMPARISON:  Abdominopelvic CT  10/26/2020. FINDINGS: Anterior sequential imaging of the abdomen and pelvis was performed for 1 hour. Patient declined further imaging. Through the imaged interval, no abnormalities are identified. There is normal blood pool, hepatic and splenic activity. No evidence of active GI bleeding. IMPRESSION: No evidence of active gastrointestinal bleeding. Electronically Signed   By: WiRichardean Sale.D.   On: 10/27/2020 16:44   CT Abdomen Pelvis W Contrast  Result Date: 10/26/2020 CLINICAL DATA:  Abdominal distension, rectal bleeding for 2 months EXAM: CT ABDOMEN AND PELVIS WITH CONTRAST TECHNIQUE: Multidetector  CT imaging of the abdomen and pelvis was performed using the standard protocol following bolus administration of intravenous contrast. CONTRAST:  129m OMNIPAQUE IOHEXOL 300 MG/ML  SOLN COMPARISON:  01/19/2020 FINDINGS: Lower chest: No acute abnormality. Dependent bibasilar scarring and or partial atelectasis. Hepatobiliary: No solid liver abnormality is seen. No gallstones, gallbladder wall thickening, or biliary dilatation. Pancreas: Unremarkable. No pancreatic ductal dilatation or surrounding inflammatory changes. Spleen: Normal in size without significant abnormality. Adrenals/Urinary Tract: Adrenal glands are unremarkable. Kidneys are normal, without renal calculi, solid lesion, or hydronephrosis. Bladder is unremarkable. Stomach/Bowel: Stomach is within normal limits. Appendix appears normal. No evidence of bowel wall thickening, distention, or inflammatory changes. Descending and sigmoid diverticulosis. Vascular/Lymphatic: No significant vascular findings are present. No enlarged abdominal or pelvic lymph nodes. Reproductive: No mass or other significant abnormality. IUD is present in the endometrial cavity. Small bilateral functional ovarian cysts and follicles. Other: No abdominal wall hernia or abnormality. No abdominopelvic ascites. Musculoskeletal: No acute or significant osseous findings.  IMPRESSION: 1. No CT findings of the abdomen or pelvis to explain abdominal pain or rectal bleeding. 2. Descending and sigmoid diverticulosis without evidence of acute diverticulitis. 3. IUD is present in the endometrial cavity. Electronically Signed   By: AEddie CandleM.D.   On: 10/26/2020 16:50   DG Hand Complete Left  Result Date: 11/09/2020 CLINICAL DATA:  Initial evaluation for acute pain status post fall. EXAM: LEFT HAND - COMPLETE 3+ VIEW COMPARISON:  Radiograph from 03/02/2018. FINDINGS: No acute fracture or dislocation. Remotely healed fracture of the left fifth metacarpal noted. Mild scattered osteoarthritic changes noted about the hand. No visible soft tissue injury. IMPRESSION: 1. No acute osseous abnormality about the left hand. 2. Remotely healed fracture of the left fifth metacarpal. Electronically Signed   By: BJeannine BogaM.D.   On: 11/09/2020 23:30    Lab Data:  CBC: Recent Labs  Lab 11/09/20 2210 11/11/20 0500 11/11/20 0513 11/11/20 1541 11/12/20 0536  WBC 11.2*  --  6.4  --  7.0  HGB 6.9* 7.7* 7.4* 8.8* 8.6*  HCT 22.6* 25.8* 24.9* 29.3* 28.9*  MCV 88.3  --  90.2  --  90.9  PLT 363  --  277  --  2287  Basic Metabolic Panel: Recent Labs  Lab 11/09/20 2210 11/11/20 0513  NA 140 140  K 3.4* 4.2  CL 110 114*  CO2 22 22  GLUCOSE 76 94  BUN 13 6  CREATININE 1.08* 0.78  CALCIUM 8.8* 8.4*   GFR: Estimated Creatinine Clearance: 87.1 mL/min (by C-G formula based on SCr of 0.78 mg/dL). Liver Function Tests: No results for input(s): AST, ALT, ALKPHOS, BILITOT, PROT, ALBUMIN in the last 168 hours. No results for input(s): LIPASE, AMYLASE in the last 168 hours. No results for input(s): AMMONIA in the last 168 hours. Coagulation Profile: No results for input(s): INR, PROTIME in the last 168 hours. Cardiac Enzymes: No results for input(s): CKTOTAL, CKMB, CKMBINDEX, TROPONINI in the last 168 hours. BNP (last 3 results) No results for input(s): PROBNP in the  last 8760 hours. HbA1C: No results for input(s): HGBA1C in the last 72 hours. CBG: Recent Labs  Lab 11/09/20 2214  GLUCAP 83   Lipid Profile: No results for input(s): CHOL, HDL, LDLCALC, TRIG, CHOLHDL, LDLDIRECT in the last 72 hours. Thyroid Function Tests: No results for input(s): TSH, T4TOTAL, FREET4, T3FREE, THYROIDAB in the last 72 hours. Anemia Panel: No results for input(s): VITAMINB12, FOLATE, FERRITIN, TIBC, IRON, RETICCTPCT in the last 72 hours.  Urine analysis:    Component Value Date/Time   COLORURINE YELLOW 11/09/2020 2210   APPEARANCEUR CLEAR 11/09/2020 2210   LABSPEC 1.022 11/09/2020 2210   PHURINE 6.5 11/09/2020 2210   GLUCOSEU NEGATIVE 11/09/2020 2210   HGBUR NEGATIVE 11/09/2020 2210   BILIRUBINUR NEGATIVE 11/09/2020 2210   KETONESUR TRACE (A) 11/09/2020 2210   PROTEINUR 30 (A) 11/09/2020 2210   UROBILINOGEN 0.2 06/17/2011 2018   NITRITE NEGATIVE 11/09/2020 2210   LEUKOCYTESUR NEGATIVE 11/09/2020 2210     Rashaun Wichert M.D. [facility] 11/12/2020, 12:56 PM  Available via Epic secure chat 7am-7pm After 7 pm, please refer to night coverage provider listed on amion.

## 2020-11-12 NOTE — Transfer of Care (Signed)
Immediate Anesthesia Transfer of Care Note  Patient: Victoria Pierce  Procedure(s) Performed: Procedure(s): COLONOSCOPY (N/A)  Patient Location: PACU and Endoscopy Unit  Anesthesia Type:MAC  Level of Consciousness: awake, alert  and oriented  Airway & Oxygen Therapy: Patient Spontanous Breathing and Patient connected to nasal cannula oxygen  Post-op Assessment: Report given to RN and Post -op Vital signs reviewed and stable  Post vital signs: Reviewed and stable  Last Vitals:  Vitals:   11/12/20 0846 11/12/20 0913  BP: 120/87 128/86  Pulse: 85 86  Resp:  11  Temp:  37 C  SpO2:  83%    Complications: No apparent anesthesia complications

## 2020-11-12 NOTE — TOC Transition Note (Signed)
Transition of Care [facility]) - CM/SW Discharge Note   Patient Details  Name: Victoria Pierce MRN: [id number] Date of Birth: 1980-06-05  [facility] of [facility]) CM/SW Contact:  Dessa Phi, RN Phone Number: 11/12/2020, 12:56 PM   Clinical Narrative: Referral to discuss affording meds-suppository. Wpoke to patient about insurance & obligation to her co pay for meds.States she can't afford suppository-informed that she has insurance & has an obligation to her co pay-hospital, & pharmacy unable to do charity since hospital is obligated to abide by rules & regulations per insurance-co pay is an obligation by the member.Informed patient she may discuss her inabilty to afford her meds with her case worker-she voiced understanding.No further CM needs.      Final next level of care: Home/Self Care Barriers to Discharge: No Barriers Identified   Patient Goals and CMS Choice Patient states their goals for this hospitalization and ongoing recovery are:: go home CMS Medicare.gov Compare Post Acute Care list provided to:: Patient    Discharge Placement                       Discharge Plan and Services   Discharge Planning Services: CM Consult                                 Social Determinants of Health (SDOH) Interventions     Readmission Risk Interventions No flowsheet data found.

## 2020-11-12 NOTE — Anesthesia Postprocedure Evaluation (Signed)
Anesthesia Post Note  Patient: Victoria Pierce  Procedure(s) Performed: COLONOSCOPY     Patient location during evaluation: PACU Anesthesia Type: MAC Level of consciousness: awake and alert and oriented Pain management: pain level controlled Vital Signs Assessment: post-procedure vital signs reviewed and stable Respiratory status: spontaneous breathing, nonlabored ventilation and respiratory function stable Cardiovascular status: blood pressure returned to baseline Postop Assessment: no apparent nausea or vomiting Anesthetic complications: no   No notable events documented.  Last Vitals:  Vitals:   11/12/20 1114 11/12/20 1141  BP: (!) 132/93 114/76  Pulse: 79 79  Resp: 16 16  Temp:  36.8 C  SpO2: 100% 100%               Brennan Bailey

## 2020-11-12 NOTE — Op Note (Signed)
[facility] Patient Name: Victoria Pierce Procedure Date: 11/12/2020 MRN: [id number] Attending MD: Arta Silence , MD Date of Birth: [birth year] CSN: [id number] Age: 40 Admit Type: Inpatient Procedure:                Colonoscopy Indications:              Hematochezia Providers:                Arta Silence, MD, Dulcy Fanny Referring MD:             Lennice Sites Medicines:                Monitored Anesthesia Care Complications:            No immediate complications. Estimated Blood Loss:     Estimated blood loss: none. Procedure:                Pre-Anesthesia Assessment:                           - Prior to the procedure, a History and Physical                            was performed, and patient medications and                            allergies were reviewed. The patient's tolerance of                            previous anesthesia was also reviewed. The risks                            and benefits of the procedure and the sedation                            options and risks were discussed with the patient.                            All questions were answered, and informed consent                            was obtained. Prior Anticoagulants: The patient has                            taken no previous anticoagulant or antiplatelet                            agents. ASA Grade Assessment: III - A patient with                            severe systemic disease. After reviewing the risks                            and benefits, the patient was deemed in  satisfactory condition to undergo the procedure.                           After obtaining informed consent, the colonoscope                            was passed under direct vision. Throughout the                            procedure, the patient's blood pressure, pulse, and                            oxygen saturations were monitored continuously. The                             [id number]-HQ190L ([id number]) Olympus colonoscope was                            introduced through the anus and advanced to the the                            cecum, identified by appendiceal orifice and                            ileocecal valve. The ileocecal valve, appendiceal                            orifice, and rectum were photographed. The entire                            colon was examined. The colonoscopy was performed                            without difficulty. The patient tolerated the                            procedure well. The quality of the bowel                            preparation was good. Scope In: 10:20:09 AM Scope Out: 10:38:35 AM Scope Withdrawal Time: 0 hours 11 minutes 53 seconds  Total Procedure Duration: 0 hours 18 minutes 26 seconds  Findings:      The perianal and digital rectal examinations were normal.      A localized area of moderately ulcerated mucosa was found in distal       anorectum, upon retroflexion; there was ulcer base, eschar, and a band       visible. No active bleeding.      No additional abnormalities were found on retroflexion.      A few medium-mouthed diverticula were found in the sigmoid colon,       descending colon and transverse colon.      Colon otherwise normal; no other polyps, masses, vascular ectasias, or       inflammatory changes were seen.      No old or fresh blood was seen to  the extent of our examination. Impression:               - Ulceration from recent hemorrhoidal banding,                            almost assuredly the cause of patient's recent                            hematochezia.                           - Diverticulosis in the sigmoid colon, in the                            descending colon and in the transverse colon.                           - No specimens collected.                           - The examination was otherwise normal. Moderate Sedation:      None Recommendation:           - Return patient  to hospital ward for ongoing care.                           - Soft diet today.                           - Bowel regimen indefinitely (Miralax 17 grams po                            qd; Benefiber one tbsp po qd).                           - Continue present medications.                           - OK to discharge home today from GI perspective.                           [facility] will sign-off; patient can follow-up                            with her outpatient gastroenterologist (Dr. Sherri Sear) upon hospital discharge; thank you for the                            consultation. Procedure Code(s):        --- Professional ---                           [phone], Colonoscopy, flexible; diagnostic, including  collection of specimen(s) by brushing or washing,                            when performed (separate procedure) Diagnosis Code(s):        --- Professional ---                           K62.6, Ulcer of anus and rectum                           K92.1, Melena (includes Hematochezia)                           K57.30, Diverticulosis of large intestine without                            perforation or abscess without bleeding CPT copyright 2019 [employer]. All rights reserved. The codes documented in this report are preliminary and upon coder review may  be revised to meet current compliance requirements. Arta Silence, MD 11/12/2020 10:56:15 AM This report has been signed electronically. Number of Addenda: 0

## 2020-11-13 LAB — CBC
HCT: 26.7 % — ABNORMAL LOW (ref 36.0–46.0)
Hemoglobin: 8.1 g/dL — ABNORMAL LOW (ref 12.0–15.0)
MCH: 27.6 pg (ref 26.0–34.0)
MCHC: 30.3 g/dL (ref 30.0–36.0)
MCV: 91.1 fL (ref 80.0–100.0)
Platelets: 224 10*3/uL (ref 150–400)
RBC: 2.93 MIL/uL — ABNORMAL LOW (ref 3.87–5.11)
RDW: 16.7 % — ABNORMAL HIGH (ref 11.5–15.5)
WBC: 7.6 10*3/uL (ref 4.0–10.5)
nRBC: 0 % (ref 0.0–0.2)

## 2020-11-13 MED ORDER — POLYETHYLENE GLYCOL 3350 17 G PO PACK: 17.0000 g | PACK | Freq: Every day | ORAL | 3 refills | Status: DC

## 2020-11-13 MED ORDER — HYDROCORTISONE (PERIANAL) 2.5 % EX CREA: 1.0000 "application " | TOPICAL_CREAM | Freq: Two times a day (BID) | CUTANEOUS | 3 refills | Status: AC

## 2020-11-13 MED ORDER — HYDROCORTISONE ACETATE 25 MG RE SUPP: 25.0000 mg | Freq: Two times a day (BID) | RECTAL | 2 refills | Status: DC | PRN

## 2020-11-13 MED ORDER — BENEFIBER DRINK MIX PO PACK: PACK | ORAL | 3 refills | Status: DC

## 2020-11-13 NOTE — Discharge Summary (Signed)
Physician Discharge Summary   Patient ID: Victoria Pierce MRN: [id number] DOB/AGE: 09-01-1980 40 y.o.  Admit date: 11/09/2020 Discharge date: 11/13/2020  Primary Care Physician:  [name], MD   Recommendations for Outpatient Follow-up:  Follow up with PCP in 1-2 weeks Outpatient follow-up with GI, Dr. Marius Ditch in 1 week  Home Health: None  Equipment/Devices:   Discharge Condition: stable  CODE STATUS: FULL  Diet recommendation: Regular diet   Discharge Diagnoses:     Symptomatic anemia/acute blood loss anemia Acute lower GI bleed secondary to hemorrhoids  Anxiety  Chronic GERD  Tobacco use disorder  Hypokalemia  Syncope and collapse  Iron deficiency anemia History of migraines  Consults: Gastroenterology    Allergies:  No Known Allergies   DISCHARGE MEDICATIONS: Allergies as of 11/13/2020   No Known Allergies      Medication List     STOP taking these medications    budesonide-formoterol 160-4.5 MCG/ACT inhaler Commonly known as: Symbicort   Fusion Plus Caps       TAKE these medications    aspirin-acetaminophen-caffeine 250-250-65 MG tablet Commonly known as: EXCEDRIN MIGRAINE Take 2 tablets by mouth every 6 (six) hours as needed for headache.   Benefiber Drink Mix Pack Take 1tbs daily. Also available OTC   cyclobenzaprine 10 MG tablet Commonly known as: FLEXERIL Take 10 mg by mouth 3 (three) times daily as needed for muscle spasms.   ferrous sulfate 325 (65 FE) MG tablet Take 1 tablet (325 mg total) by mouth daily.   gabapentin 300 MG capsule Commonly known as: NEURONTIN Take 900 mg by mouth 2 (two) times daily.   hydrocortisone 2.5 % rectal cream Commonly known as: ANUSOL-HC Place 1 application rectally 2 (two) times daily. What changed:  when to take this reasons to take this additional instructions   hydrocortisone 25 MG suppository Commonly known as: ANUSOL-HC Place 1 suppository (25 mg total) rectally 2 (two)  times daily as needed for hemorrhoids or anal itching.   lamoTRIgine 200 MG tablet Commonly known as: LAMICTAL Take 200 mg by mouth daily.   ondansetron 4 MG disintegrating tablet Commonly known as: ZOFRAN-ODT Take 1 tablet (4 mg total) by mouth every 6 (six) hours as needed for nausea or vomiting.   polyethylene glycol 17 g packet Commonly known as: MIRALAX / GLYCOLAX Take 17 g by mouth daily. Available OTC   propranolol 20 MG tablet Commonly known as: INDERAL Take 20 mg by mouth 4 (four) times daily.   QUEtiapine 50 MG tablet Commonly known as: SEROQUEL Take 150 mg by mouth at bedtime.   topiramate 50 MG tablet Commonly known as: TOPAMAX Take 50 mg by mouth 2 (two) times daily.   Vyvanse 70 MG capsule Generic drug: lisdexamfetamine Take 70 mg by mouth every morning.         Brief H and P: For complete details please refer to admission H and P, but in brief,  Patient is a 40 year old female with ulcerative colitis, diverticulitis, diverticulosis, asthma, hemorrhoids, nicotine dependence presented after a witnessed syncopal episode.  Patient reported that she was at a laundromat and was getting something out of the dryer when she felt dizzy and lightheaded, passed out hitting her head on the floor.  She landed on the right side of her face, subsequently complained about pain around her right side of the jaw.  Also reported 2 prior episodes over the last couple of days. Patient was recently admitted at [facility], on 10/26/2020 however left AMA on  7/13.  She was admitted with GI bleed.  She had received blood transfusion for hemoglobin of 6.2.  Tagged RBC scan did not reveal active GI bleeding but showed colonic diverticulosis.  Colonoscopy was recommended. She did follow-up with her GI at [facility] and had banding of the internal hemorrhoid last week.   In ED CT head showed no acute findings, x-ray of left hand showed no acute fracture.  Hemoglobin down to 6.9  Hospital Course:    Symptomatic anemia, acute blood loss anemia, lower GI bleed, iron deficiency anemia -Hemoglobin 6.9 at the time of admission, received 2 units packed RBCs, H&H currently stable, 8.1 at the time of discharge.  GI was consulted. -Colonoscopy showed ulceration from recent hemorrhoidal banding likely the cause of hematochezia.  Diverticulosis in the sigmoid colon, descending colon and transverse colon. -Soft diet with bowel regimen, MiraLAX 17 g daily, Benefiber 1 tablespoon daily. -Continue Anusol suppositories twice daily, outpatient follow-up with GI next week     Iron deficiency anemia -Anemia panel showed severe iron deficiency anemia, ferritin 4, iron saturation 3, Fe 12.  B12, folate WNL, -Status post 1 unit IV Feraheme transfusion on 7/20       Anxiety, history of bipolar disorder -Resumed all psych meds including Seroquel, Inderal, Lamictal, Vyvanse, gabapentin, propranolol     Tobacco use disorder -Counseled on smoking cessation     Syncope and collapse -Likely due to acute blood loss anemia, currently stable and ambulating without any difficulty.     Hypokalemia -Resolved   History of migraines -Continue Topamax  Day of Discharge S: Stable, no further rectal bleeding overnight.  No nausea vomiting or abdominal pain.  BP 124/84   Pulse 95   Temp 98.7 F (37.1 C) (Oral)   Resp 18   Ht 5' 3"  (1.6 m)   Wt 69 kg   LMP 11/01/2020   SpO2 98%   BMI 26.95 kg/m   Physical Exam: General: Alert and awake oriented x3 not in any acute distress. CVS: S1-S2 clear no murmur rubs or gallops Chest: clear to auscultation bilaterally, no wheezing rales or rhonchi Abdomen: soft nontender, nondistended, normal bowel sounds Extremities: no cyanosis, clubbing or edema noted bilaterally Neuro: no new deficits    Get Medicines reviewed and adjusted: Please take all your medications with you for your next visit with your Primary MD  Please request your Primary MD to go over all  hospital tests and procedure/radiological results at the follow up. Please ask your Primary MD to get all Hospital records sent to his/her office.  If you experience worsening of your admission symptoms, develop shortness of breath, life threatening emergency, suicidal or homicidal thoughts you must seek medical attention immediately by calling 911 or calling your MD immediately  if symptoms less severe.  You must read complete instructions/literature along with all the possible adverse reactions/side effects for all the Medicines you take and that have been prescribed to you. Take any new Medicines after you have completely understood and accept all the possible adverse reactions/side effects.   Do not drive when taking pain medications.   Do not take more than prescribed Pain, Sleep and Anxiety Medications  Special Instructions: If you have smoked or chewed Tobacco  in the last 2 yrs please stop smoking, stop any regular Alcohol  and or any Recreational drug use.  Wear Seat belts while driving.  Please note  You were cared for by a hospitalist during your hospital stay. Once you are discharged, your primary  care physician will handle any further medical issues. Please note that NO REFILLS for any discharge medications will be authorized once you are discharged, as it is imperative that you return to your primary care physician (or establish a relationship with a primary care physician if you do not have one) for your aftercare needs so that they can reassess your need for medications and monitor your lab values.   The results of significant diagnostics from this hospitalization (including imaging, microbiology, ancillary and laboratory) are listed below for reference.      Procedures/Studies:  CT Head Wo Contrast  Result Date: 11/09/2020 CLINICAL DATA:  40 year old female with trauma. EXAM: CT HEAD WITHOUT CONTRAST TECHNIQUE: Contiguous axial images were obtained from the base of the  skull through the vertex without intravenous contrast. COMPARISON:  None. FINDINGS: Brain: The ventricles and sulci appropriate size for patient's age. The gray-white matter discrimination is preserved. There is no acute intracranial hemorrhage. No mass effect or midline shift. No extra-axial fluid collection. Vascular: No hyperdense vessel or unexpected calcification. Skull: Normal. Negative for fracture or focal lesion. Sinuses/Orbits: No acute finding. Other: None IMPRESSION: Unremarkable noncontrast CT of the brain. Electronically Signed   By: Anner Crete M.D.   On: 11/09/2020 23:26   NM GI Blood Loss  Result Date: 10/27/2020 CLINICAL DATA:  Lower GI bleed for a couple months. Last episode earlier today. EXAM: NUCLEAR MEDICINE GASTROINTESTINAL BLEEDING SCAN TECHNIQUE: Sequential abdominal images were obtained following intravenous administration of Tc-35mlabeled red blood cells. RADIOPHARMACEUTICALS:  20.5 mCi Tc-988mertechnetate in-vitro labeled red cells. COMPARISON:  Abdominopelvic CT 10/26/2020. FINDINGS: Anterior sequential imaging of the abdomen and pelvis was performed for 1 hour. Patient declined further imaging. Through the imaged interval, no abnormalities are identified. There is normal blood pool, hepatic and splenic activity. No evidence of active GI bleeding. IMPRESSION: No evidence of active gastrointestinal bleeding. Electronically Signed   By: WiRichardean Sale.D.   On: 10/27/2020 16:44   CT Abdomen Pelvis W Contrast  Result Date: 10/26/2020 CLINICAL DATA:  Abdominal distension, rectal bleeding for 2 months EXAM: CT ABDOMEN AND PELVIS WITH CONTRAST TECHNIQUE: Multidetector CT imaging of the abdomen and pelvis was performed using the standard protocol following bolus administration of intravenous contrast. CONTRAST:  10033mMNIPAQUE IOHEXOL 300 MG/ML  SOLN COMPARISON:  01/19/2020 FINDINGS: Lower chest: No acute abnormality. Dependent bibasilar scarring and or partial atelectasis.  Hepatobiliary: No solid liver abnormality is seen. No gallstones, gallbladder wall thickening, or biliary dilatation. Pancreas: Unremarkable. No pancreatic ductal dilatation or surrounding inflammatory changes. Spleen: Normal in size without significant abnormality. Adrenals/Urinary Tract: Adrenal glands are unremarkable. Kidneys are normal, without renal calculi, solid lesion, or hydronephrosis. Bladder is unremarkable. Stomach/Bowel: Stomach is within normal limits. Appendix appears normal. No evidence of bowel wall thickening, distention, or inflammatory changes. Descending and sigmoid diverticulosis. Vascular/Lymphatic: No significant vascular findings are present. No enlarged abdominal or pelvic lymph nodes. Reproductive: No mass or other significant abnormality. IUD is present in the endometrial cavity. Small bilateral functional ovarian cysts and follicles. Other: No abdominal wall hernia or abnormality. No abdominopelvic ascites. Musculoskeletal: No acute or significant osseous findings. IMPRESSION: 1. No CT findings of the abdomen or pelvis to explain abdominal pain or rectal bleeding. 2. Descending and sigmoid diverticulosis without evidence of acute diverticulitis. 3. IUD is present in the endometrial cavity. Electronically Signed   By: AleEddie CandleD.   On: 10/26/2020 16:50   DG Hand Complete Left  Result Date: 11/09/2020 CLINICAL DATA:  Initial  evaluation for acute pain status post fall. EXAM: LEFT HAND - COMPLETE 3+ VIEW COMPARISON:  Radiograph from 03/02/2018. FINDINGS: No acute fracture or dislocation. Remotely healed fracture of the left fifth metacarpal noted. Mild scattered osteoarthritic changes noted about the hand. No visible soft tissue injury. IMPRESSION: 1. No acute osseous abnormality about the left hand. 2. Remotely healed fracture of the left fifth metacarpal. Electronically Signed   By: Jeannine Boga M.D.   On: 11/09/2020 23:30      LAB RESULTS: Basic Metabolic  Panel: Recent Labs  Lab 11/09/20 2210 11/11/20 0513  NA 140 140  K 3.4* 4.2  CL 110 114*  CO2 22 22  GLUCOSE 76 94  BUN 13 6  CREATININE 1.08* 0.78  CALCIUM 8.8* 8.4*   Liver Function Tests: No results for input(s): AST, ALT, ALKPHOS, BILITOT, PROT, ALBUMIN in the last 168 hours. No results for input(s): LIPASE, AMYLASE in the last 168 hours. No results for input(s): AMMONIA in the last 168 hours. CBC: Recent Labs  Lab 11/12/20 0536 11/13/20 0548  WBC 7.0 7.6  HGB 8.6* 8.1*  HCT 28.9* 26.7*  MCV 90.9 91.1  PLT 268 224   Cardiac Enzymes: No results for input(s): CKTOTAL, CKMB, CKMBINDEX, TROPONINI in the last 168 hours. BNP: Invalid input(s): POCBNP CBG: Recent Labs  Lab 11/09/20 2214  GLUCAP 83       Disposition and Follow-up: Discharge Instructions     Diet - low sodium heart healthy   Complete by: As directed    Increase activity slowly   Complete by: As directed         DISPOSITION: home    Abie, Ngwe A, MD. Schedule an appointment as soon as possible for a visit in 2 week(s).   Specialty: Family Medicine Why: for hospital follow-up Contact information: [place] [address] [phone]         Lin Landsman, MD. Schedule an appointment as soon as possible for a visit in 1 week(s).   Specialty: Gastroenterology Why: for hospital follow-up Contact information: [place] [address] [phone]                  Time coordinating discharge:  25 mins   Signed:   Estill Cotta M.D. [facility] 11/13/2020, 11:01 AM

## 2020-11-13 NOTE — Plan of Care (Signed)
  Problem: Clinical Measurements: Goal: Ability to maintain clinical measurements within normal limits will improve Outcome: Progressing Goal: Diagnostic test results will improve Outcome: Progressing   Problem: Coping: Goal: Level of anxiety will decrease Outcome: Progressing

## 2020-11-13 NOTE — Progress Notes (Signed)
Discharge instructions reviewed with patient, all questions answered. Ivs removed. Pt discharged in stable condition with all patient belongings.

## 2020-11-15 ENCOUNTER — Encounter (HOSPITAL_COMMUNITY): Payer: Self-pay | Admitting: Gastroenterology

## 2020-11-18 ENCOUNTER — Telehealth: Payer: Self-pay | Admitting: Gastroenterology

## 2020-11-18 NOTE — Telephone Encounter (Signed)
Patient calling about her appt on Monday 11/22/20 and will be at the appt. She was inpatient at [facility] for 4 days and had a colonoscopy.  [place] is keeping you updated on current events.

## 2020-11-22 ENCOUNTER — Ambulatory Visit: Payer: Medicaid Other | Admitting: Gastroenterology

## 2020-11-22 ENCOUNTER — Other Ambulatory Visit: Payer: Self-pay

## 2020-11-22 DIAGNOSIS — K641 Second degree hemorrhoids: Secondary | ICD-10-CM

## 2020-11-22 DIAGNOSIS — K625 Hemorrhage of anus and rectum: Secondary | ICD-10-CM

## 2020-11-22 DIAGNOSIS — D509 Iron deficiency anemia, unspecified: Secondary | ICD-10-CM

## 2020-11-22 NOTE — Progress Notes (Signed)
Per Dr. Marius Ditch order CBC and iron panel for severe iron deficiency anemia. She states to place referral to Dr. Victorio Palm at [facility] for surgery. Order labs and placed referral

## 2020-11-23 ENCOUNTER — Ambulatory Visit: Payer: Medicaid Other | Admitting: Gastroenterology

## 2020-11-23 NOTE — Telephone Encounter (Signed)
Appointment cancelled and referred to colorectal surgeon at [facility] for hemorrhoidectomy  RV

## 2020-11-30 ENCOUNTER — Ambulatory Visit: Payer: Medicaid Other | Admitting: Gastroenterology

## 2020-12-01 ENCOUNTER — Encounter: Payer: Self-pay | Admitting: Gastroenterology

## 2020-12-06 ENCOUNTER — Encounter: Payer: Self-pay | Admitting: Family Medicine

## 2020-12-07 ENCOUNTER — Ambulatory Visit: Payer: Medicaid Other | Admitting: Gastroenterology

## 2020-12-16 ENCOUNTER — Encounter: Payer: Self-pay | Admitting: Gastroenterology

## 2020-12-17 ENCOUNTER — Encounter: Payer: Self-pay | Admitting: Gastroenterology

## 2020-12-17 ENCOUNTER — Ambulatory Visit (INDEPENDENT_AMBULATORY_CARE_PROVIDER_SITE_OTHER): Payer: Medicaid Other | Admitting: Gastroenterology

## 2020-12-17 ENCOUNTER — Other Ambulatory Visit: Payer: Self-pay

## 2020-12-17 VITALS — BP 113/77 | HR 78 | Temp 97.9°F | Ht 63.0 in | Wt 154.4 lb

## 2020-12-17 DIAGNOSIS — D500 Iron deficiency anemia secondary to blood loss (chronic): Secondary | ICD-10-CM | POA: Diagnosis not present

## 2020-12-17 DIAGNOSIS — K625 Hemorrhage of anus and rectum: Secondary | ICD-10-CM

## 2020-12-17 NOTE — Progress Notes (Signed)
Cephas Darby, MD [address]  [place]  [place], [place] [address]  Main: [phone]  Fax: [phone]    Gastroenterology Consultation  Referring Provider:     [name], MD Primary Care Physician:  [name], MD Primary Gastroenterologist:  Dr. Cephas Darby Reason for Consultation:     Rectal bleeding        HPI:   Victoria Pierce is a 40 y.o. female referred by Dr. [name], MD  for consultation & management of rectal bleeding.  Patient is here for follow-up of rectal bleeding.  Her rectal bleeding has currently resolved.  She was admitted to [facility] on 11/09/2020 after a syncopal episode from severe iron deficiency anemia.  Patient underwent colonoscopy which revealed post banding ulcer in the rectum with no stigmata of recent bleeding.  The band was still intact.  Patient underwent hemorrhoid ligation on 11/03/2020.  She developed small amount of rectal bleeding as well as severe rectal pain after banding. Patient received blood transfusion during the hospitalization.  Her most recent hemoglobin at PCPs office was 11.2 and ferritin was 47.  She is not taking any iron at this time.  She reports that her energy levels are improving.  She denies constipation.  She denies any rectal pain.  Patient was evaluated by colorectal surgeon for potential hemorrhoidectomy.  The surgery was deferred.  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  She was advised to stop stool softeners and fiber supplements as she was experiencing diarrhea.  NSAIDs: None  Antiplts/Anticoagulants/Anti thrombotics: None  GI Procedures:  Colonoscopy 11/12/2020 - Ulceration from recent hemorrhoidal banding, almost assuredly the cause of patient's recent hematochezia. - Diverticulosis in the sigmoid colon, in the descending colon and in the transverse colon. - No specimens collected. - The examination was otherwise normal.  EGD and flexible sigmoidoscopy 02/04/2020 -  Normal duodenal bulb and second portion of the duodenum. - Erosive gastropathy with no bleeding and no stigmata of recent bleeding. Biopsied. - A 867 degree fundoplication was found. The wrap appears intact. - Normal gastroesophageal junction and esophagus.   - Diverticulosis in the sigmoid colon and in the descending colon. - Non-bleeding external hemorrhoids, source of rectal bleeding. - No specimens collected  Past Medical History:  Diagnosis Date   Anxiety    Asthma    Bipolar 1 disorder ([place])    Depression    Diverticulitis    GERD (gastroesophageal reflux disease)    Hernia, abdominal    Hiatal hernia    Ulcerative colitis    Vaginal septum     Past Surgical History:  Procedure Laterality Date   CLOSED REDUCTION METACARPAL WITH PERCUTANEOUS PINNING Left 03/07/2018   Procedure: CLOSED REDUCTION METACARPAL WITH PERCUTANEOUS PINNING-5th metacarpal;  Surgeon: Corky Mull, MD;  Location: [facility] ORS;  Service: Orthopedics;  Laterality: Left;   COLONOSCOPY N/A 11/12/2020   Procedure: COLONOSCOPY;  Surgeon: Arta Silence, MD;  Location: [facility] ENDOSCOPY;  Service: Endoscopy;  Laterality: N/A;   COLONOSCOPY WITH PROPOFOL N/A 05/14/2018   Procedure: COLONOSCOPY WITH PROPOFOL;  Surgeon: Lin Landsman, MD;  Location: [facility] ENDOSCOPY;  Service: Gastroenterology;  Laterality: N/A;   ESOPHAGOGASTRODUODENOSCOPY (EGD) WITH PROPOFOL N/A 05/14/2018   Procedure: ESOPHAGOGASTRODUODENOSCOPY (EGD) WITH PROPOFOL;  Surgeon: Lin Landsman, MD;  Location: [facility] ENDOSCOPY;  Service: Gastroenterology;  Laterality: N/A;   ESOPHAGOGASTRODUODENOSCOPY (EGD) WITH PROPOFOL N/A 03/06/2019   Procedure: ESOPHAGOGASTRODUODENOSCOPY (EGD) WITH PROPOFOL;  Surgeon: Lin Landsman, MD;  Location: [facility] ENDOSCOPY;  Service: Gastroenterology;  Laterality: N/A;   ESOPHAGOGASTRODUODENOSCOPY (EGD) WITH PROPOFOL N/A 02/04/2020   Procedure: ESOPHAGOGASTRODUODENOSCOPY (EGD) WITH PROPOFOL;  Surgeon: Lin Landsman, MD;  Location: [facility] ENDOSCOPY;  Service: Gastroenterology;  Laterality: N/A;   FLEXIBLE SIGMOIDOSCOPY N/A 02/04/2020   Procedure: FLEXIBLE SIGMOIDOSCOPY;  Surgeon: Lin Landsman, MD;  Location: [facility] ENDOSCOPY;  Service: Gastroenterology;  Laterality: N/A;   FRACTURE SURGERY     HARDWARE REMOVAL Left 04/03/2018   Procedure: LEFT HAND FIFTH METACARPAL PIN REMOVAL;  Surgeon: Corky Mull, MD;  Location: [place];  Service: Orthopedics;  Laterality: Left;   HERNIA REPAIR     ROBOTIC ASSISTED LAPAROSCOPIC NISSEN FUNDOPLICATION N/A 3/41/9379   Procedure: ROBOTIC ASSISTED LAPAROSCOPIC NISSEN FUNDOPLICATION;  Surgeon: Jules Husbands, MD;  Location: [facility] ORS;  Service: General;  Laterality: N/A;   TRIGGER FINGER RELEASE Left 06/19/2018   Procedure: EXTENSOR TENOLYSIS WITH CAPSULAR RELEASE OF LEFT LITTLE MCP JOINT;  Surgeon: Corky Mull, MD;  Location: [place];  Service: Orthopedics;  Laterality: Left;   TUBAL LIGATION      Current Outpatient Medications:    aspirin-acetaminophen-caffeine (EXCEDRIN MIGRAINE) 250-250-65 MG tablet, Take 2 tablets by mouth every 6 (six) hours as needed for headache., Disp: , Rfl:    cyclobenzaprine (FLEXERIL) 10 MG tablet, Take 10 mg by mouth 3 (three) times daily as needed for muscle spasms. , Disp: , Rfl:    diazepam (VALIUM) 5 MG tablet, Take 2 tablets by mouth at bedtime., Disp: , Rfl:    ferrous sulfate 325 (65 FE) MG tablet, Take 1 tablet (325 mg total) by mouth daily., Disp: 30 tablet, Rfl: 0   gabapentin (NEURONTIN) 300 MG capsule, Take 900 mg by mouth 2 (two) times daily., Disp: , Rfl:    hydrocortisone (ANUSOL-HC) 2.5 % rectal cream, Place 1 application rectally 2 (two) times daily., Disp: 28 g, Rfl: 3   hydrocortisone (ANUSOL-HC) 25 MG suppository, Place 1 suppository (25 mg total) rectally 2 (two) times daily as needed for hemorrhoids or anal itching., Disp: 100 suppository, Rfl: 2   lamoTRIgine (LAMICTAL) 200 MG tablet, Take  200 mg by mouth daily., Disp: , Rfl:    ondansetron (ZOFRAN-ODT) 4 MG disintegrating tablet, Take 1 tablet (4 mg total) by mouth every 6 (six) hours as needed for nausea or vomiting., Disp: 40 tablet, Rfl: 0   polyethylene glycol (MIRALAX / GLYCOLAX) 17 g packet, Take 17 g by mouth daily. Available OTC, Disp: 30 each, Rfl: 3   propranolol (INDERAL) 20 MG tablet, Take 20 mg by mouth 4 (four) times daily., Disp: , Rfl:    QUEtiapine (SEROQUEL) 50 MG tablet, Take 150 mg by mouth at bedtime., Disp: , Rfl:    rizatriptan (MAXALT) 10 MG tablet, 1 tablet., Disp: , Rfl:    topiramate (TOPAMAX) 50 MG tablet, Take 50 mg by mouth 2 (two) times daily., Disp: , Rfl:    VYVANSE 70 MG capsule, Take 70 mg by mouth every morning., Disp: , Rfl:    Wheat Dextrin (BENEFIBER DRINK MIX) PACK, Take 1tbs daily. Also available OTC, Disp: 28 each, Rfl: 3  Current Facility-Administered Medications:    levonorgestrel (MIRENA) 20 MCG/24HR IUD, , Intrauterine, Once, Schuman, Christanna R, MD    Family History  Problem Relation Age of Onset   Hypertension Sister    Hyperlipidemia Mother    Hypertension Mother    Hypertension Father    Hyperlipidemia Father      Social History  Tobacco Use   Smoking status: Every Day    Packs/day: 1.00    Years: 27.00    Pack years: 27.00    Types: Cigarettes   Smokeless tobacco: Never   Tobacco comments:    0.5ppd 08/19/2020  Vaping Use   Vaping Use: Never used  Substance Use Topics   Alcohol use: No   Drug use: Yes    Types: Marijuana    Comment: used Monday    Allergies as of 12/17/2020   (No Known Allergies)    Review of Systems:    All systems reviewed and negative except where noted in HPI.   Physical Exam:  BP 113/77   Pulse 78   Temp 97.9 F (36.6 C) (Oral)   Ht 5' 3"  (1.6 m)   Wt 154 lb 6.4 oz (70 kg)   BMI 27.35 kg/m  No LMP recorded. (Menstrual status: IUD).  General:   Alert,  Well-developed, well-nourished, pleasant and cooperative in  NAD Head:  Normocephalic and atraumatic. Eyes:  Sclera clear, no icterus.   Conjunctiva pink. Ears:  Normal auditory acuity. Nose:  No deformity, discharge, or lesions. Mouth:  No deformity or lesions,oropharynx pink & moist. Neck:  Supple; no masses or thyromegaly. Lungs:  Respirations even and unlabored.  Clear throughout to auscultation.   No wheezes, crackles, or rhonchi. No acute distress. Heart:  Regular rate and rhythm; no murmurs, clicks, rubs, or gallops. Abdomen:  Normal bowel sounds. Soft, non-tender and non-distended without masses, hepatosplenomegaly or hernias noted.  No guarding or rebound tenderness.   Rectal: Nontender digital rectal exam, anoscopy revealed external hemorrhoids and post banding ulcer that is healing well Msk:  Symmetrical without gross deformities. Good, equal movement & strength bilaterally. Pulses:  Normal pulses noted. Extremities:  No clubbing or edema.  No cyanosis. Neurologic:  Alert and oriented x3;  grossly normal neurologically. Skin:  Intact without significant lesions or rashes. No jaundice. Psych:  Alert and cooperative. Normal mood and affect.  Imaging Studies: Reviewed  Assessment and Plan:   Victoria Pierce is a 40 y.o. female with history of rectal bleeding requiring blood transfusions, left-sided diverticulosis, s/p ligation of the RP hemorrhoid. Iron deficiency anemia: Resolved Check CBC today Ferritin levels are improving Fusion plus samples provided   Follow up as needed   Cephas Darby, MD

## 2020-12-18 LAB — CBC
Hematocrit: 39.7 % (ref 34.0–46.6)
Hemoglobin: 12.6 g/dL (ref 11.1–15.9)
MCH: 28.9 pg (ref 26.6–33.0)
MCHC: 31.7 g/dL (ref 31.5–35.7)
MCV: 91 fL (ref 79–97)
Platelets: 303 10*3/uL (ref 150–450)
RBC: 4.36 x10E6/uL (ref 3.77–5.28)
RDW: 17 % — ABNORMAL HIGH (ref 11.7–15.4)
WBC: 8.9 10*3/uL (ref 3.4–10.8)

## 2021-01-05 ENCOUNTER — Telehealth: Payer: Self-pay | Admitting: Gastroenterology

## 2021-01-05 NOTE — Telephone Encounter (Signed)
Patient verbalized understanding  

## 2021-01-05 NOTE — Telephone Encounter (Signed)
Pt. Calling to see if her blood needs to be rechecked.

## 2021-01-05 NOTE — Telephone Encounter (Signed)
Her last hemoglobin less than a month ago was normal.  If she is not bleeding, we do not have to recheck at this time  RV

## 2021-06-21 ENCOUNTER — Other Ambulatory Visit (HOSPITAL_COMMUNITY): Payer: Self-pay

## 2021-07-22 ENCOUNTER — Encounter: Payer: Self-pay | Admitting: Gastroenterology

## 2021-08-05 ENCOUNTER — Ambulatory Visit (INDEPENDENT_AMBULATORY_CARE_PROVIDER_SITE_OTHER): Payer: Medicaid Other

## 2021-08-05 ENCOUNTER — Encounter: Payer: Self-pay | Admitting: Emergency Medicine

## 2021-08-05 ENCOUNTER — Ambulatory Visit
Admission: EM | Admit: 2021-08-05 | Discharge: 2021-08-05 | Disposition: A | Discharge status: Home / Self Care | Payer: Medicaid Other

## 2021-08-05 DIAGNOSIS — M25531 Pain in right wrist: Secondary | ICD-10-CM | POA: Diagnosis not present

## 2021-08-05 DIAGNOSIS — M25431 Effusion, right wrist: Secondary | ICD-10-CM

## 2021-08-05 DIAGNOSIS — R202 Paresthesia of skin: Secondary | ICD-10-CM

## 2021-08-05 MED ORDER — IBUPROFEN 600 MG PO TABS
600.0000 mg | ORAL_TABLET | Freq: Four times a day (QID) | ORAL | 0 refills | Status: DC | PRN
Start: 2021-08-05 — End: 2021-09-14

## 2021-08-05 NOTE — Discharge Instructions (Addendum)
Take the ibuprofen as prescribed.  Rest and elevate your wrist.  Apply ice packs 2-3 times a day for up to 20 minutes each.  Wear the wrist splint as needed for comfort.   ? ?Follow up with an orthopedist.   ? ?

## 2021-08-05 NOTE — ED Triage Notes (Signed)
Patient c/o RT forearm pain and hand tingling x 2 weeks.  ? ?Patient denies fall or trauma.  ? ?Patient endorses worsening tingling when sleeping and when using Rt hand.  ? ?Patient endorses swelling in extremity.  ? ?Patient has taken Naproxen and Flexeril with no relief of symptoms.  ?

## 2021-08-05 NOTE — ED Provider Notes (Signed)
?[facility] ? ? ? ?CSN: [id number] ?Arrival date & time: 08/05/21  1749 ? ? ?  ? ?History   ?Chief Complaint ?Chief Complaint  ?Patient presents with  ? Arm Pain  ? Hand Problem  ? ? ?HPI ?Victoria Pierce is a 41 y.o. female.  Patient presents with right wrist pain and swelling x2 weeks.  No falls or injury.  She also reports numbness and tingling in her right thumb, index finger, middle finger.  No open wounds, redness, bruising, or other symptoms.  Treatment attempted at home with naproxen and Flexeril.  Her medical history includes asthma, ulcerative colitis, diverticulitis, GERD, bipolar 1 disorder, depression, anxiety. ? ?The history is provided by the patient and medical records.  ? ?Past Medical History:  ?Diagnosis Date  ? Anxiety   ? Asthma   ? Bipolar 1 disorder ([place])   ? Depression   ? Diverticulitis   ? GERD (gastroesophageal reflux disease)   ? Hernia, abdominal   ? Hiatal hernia   ? Symptomatic anemia 10/26/2020  ? Ulcerative colitis   ? Vaginal septum   ? ? ?Patient Active Problem List  ? Diagnosis Date Noted  ? Acute GI bleeding 11/11/2020  ? Hypokalemia 11/10/2020  ? Diverticulosis 10/27/2020  ? Asthma 10/27/2020  ? Shortness of breath 06/18/2020  ? Chest pain of uncertain etiology 19/62/2297  ? Left breast lump 06/18/2020  ? Non-intractable vomiting   ? Rectal bleeding   ? Tobacco use disorder 08/11/2019  ? S/P repair of paraesophageal hernia 08/07/2019  ? History of Nissen fundoplication 98/92/1194  ? Chronic pelvic pain in female 06/14/2018  ? Dysmenorrhea 06/14/2018  ? Menorrhagia with regular cycle 06/14/2018  ? Contracture of finger joint, 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  ? Closed displaced fracture of neck of fifth metacarpal bone of left hand 03/07/2018  ? Chronic GERD   ? Anxiety   ? ? ?Past Surgical History:  ?Procedure Laterality Date  ? CLOSED REDUCTION METACARPAL WITH PERCUTANEOUS PINNING Left 03/07/2018  ? Procedure: CLOSED REDUCTION METACARPAL WITH PERCUTANEOUS PINNING-5th metacarpal;   Surgeon: Corky Mull, MD;  Location: [facility];  Service: Orthopedics;  Laterality: Left;  ? COLONOSCOPY N/A 11/12/2020  ? Procedure: COLONOSCOPY;  Surgeon: Arta Silence, MD;  Location: [facility];  Service: Endoscopy;  Laterality: N/A;  ? COLONOSCOPY WITH PROPOFOL N/A 05/14/2018  ? Procedure: COLONOSCOPY WITH PROPOFOL;  Surgeon: Lin Landsman, MD;  Location: [facility] ENDOSCOPY;  Service: Gastroenterology;  Laterality: N/A;  ? ESOPHAGOGASTRODUODENOSCOPY (EGD) WITH PROPOFOL N/A 05/14/2018  ? Procedure: ESOPHAGOGASTRODUODENOSCOPY (EGD) WITH PROPOFOL;  Surgeon: Lin Landsman, MD;  Location: [facility] ENDOSCOPY;  Service: Gastroenterology;  Laterality: N/A;  ? ESOPHAGOGASTRODUODENOSCOPY (EGD) WITH PROPOFOL N/A 03/06/2019  ? Procedure: ESOPHAGOGASTRODUODENOSCOPY (EGD) WITH PROPOFOL;  Surgeon: Lin Landsman, MD;  Location: [facility]COPY;  Service: Gastroenterology;  Laterality: N/A;  ? ESOPHAGOGASTRODUODENOSCOPY (EGD) WITH PROPOFOL N/A 02/04/2020  ? Procedure: ESOPHAGOGASTRODUODENOSCOPY (EGD) WITH PROPOFOL;  Surgeon: Lin Landsman, MD;  Location: [facility] ENDOSCOPY;  Service: Gastroenterology;  Laterality: N/A;  ? FLEXIBLE SIGMOIDOSCOPY N/A 02/04/2020  ? Procedure: FLEXIBLE SIGMOIDOSCOPY;  Surgeon: Lin Landsman, MD;  Location: [facility] END[facility]COPY;  Service: Gastroenterology;  Laterality: N/A;  ? FRACTURE SURGERY    ? HARDWARE REMOVAL Left 04/03/2018  ? Procedure: LEFT HAND FIFTH METACARPAL PIN REMOVAL;  Surgeon: Corky Mull, MD;  Location: [place];  Service: Orthopedics;  Laterality: Left;  ? HERNIA REPAIR    ? ROBOTIC ASSISTED LAPAROSCOPIC NISSEN FUNDOPLICATION N/A 1/74/0814  ? Procedure: ROBOTIC ASSISTED LAPAROSCOPIC  NISSEN FUNDOPLICATION;  Surgeon: Jules Husbands, MD;  Location: [facility];  Service: General;  Laterality: N/A;  ? TRIGGER FINGER RELEASE Left 06/19/2018  ? Procedure: EXTENSOR TENOLYSIS WITH CAPSULAR RELEASE OF LEFT LITTLE MCP JOINT;  Surgeon: Corky Mull, MD;  Location:  [place];  Service: Orthopedics;  Laterality: Left;  ? TUBAL LIGATION    ? ? ?OB History   ? ? Gravida  ?2  ? Para  ?2  ? Term  ?2  ? Preterm  ?   ? AB  ?   ? Living  ?2  ?  ? ? SAB  ?   ? IAB  ?   ? Ectopic  ?   ? Multiple  ?   ? Live Births  ?2  ?   ?  ?  ? ? ? ?Home Medications   ? ?Prior to Admission medications   ?Medication Sig Start Date End Date Taking? Authorizing Provider  ?ADDERALL XR 30 MG 24 hr capsule Take 30 mg by mouth every morning. 07/20/21  Yes [provider]  ?ARIPiprazole (ABILIFY) 5 MG tablet Take 5 mg by mouth daily. 07/29/21  Yes [provider]  ?benztropine (COGENTIN) 0.5 MG tablet Take 0.5 mg by mouth at bedtime. 07/20/21  Yes [provider]  ?cyclobenzaprine (FLEXERIL) 10 MG tablet Take 10 mg by mouth 3 (three) times daily as needed for muscle spasms.    Yes [provider]  ?ferrous sulfate 325 (65 FE) MG tablet Take 1 tablet (325 mg total) by mouth daily. 11/03/20  Yes Long, Wonda Olds, MD  ?gabapentin (NEURONTIN) 300 MG capsule Take 900 mg by mouth 2 (two) times daily. 10/08/20  Yes [provider]  ?ibuprofen (ADVIL) 600 MG tablet Take 1 tablet (600 mg total) by mouth every 6 (six) hours as needed. 08/05/21  Yes Sharion Balloon, NP  ?lamoTRIgine (LAMICTAL) 200 MG tablet Take 200 mg by mouth daily. 11/01/20  Yes [provider]  ?propranolol (INDERAL) 20 MG tablet Take 20 mg by mouth 4 (four) times daily. 06/16/19  Yes [provider]  ?QUEtiapine (SEROQUEL) 50 MG tablet Take 150 mg by mouth at bedtime. 05/25/20  Yes [provider]  ?topiramate (TOPAMAX) 50 MG tablet Take 50 mg by mouth 2 (two) times daily.   Yes [provider]  ?aspirin-acetaminophen-caffeine (EXCEDRIN MIGRAINE) [phone] MG tablet Take 2 tablets by mouth every 6 (six) hours as needed for headache.    [provider]  ?diazepam (VALIUM) 5 MG tablet Take 2 tablets by mouth at bedtime. 12/08/20   [provider]   ?hydrocortisone (ANUSOL-HC) 2.5 % rectal cream Place 1 application rectally 2 (two) times daily. 11/13/20   Rai, Vernelle Emerald, MD  ?hydrocortisone (ANUSOL-HC) 25 MG suppository Place 1 suppository (25 mg total) rectally 2 (two) times daily as needed for hemorrhoids or anal itching. 11/13/20   Rai, Vernelle Emerald, MD  ?ondansetron (ZOFRAN-ODT) 4 MG disintegrating tablet Take 1 tablet (4 mg total) by mouth every 6 (six) hours as needed for nausea or vomiting. 09/27/18   Tylene Fantasia, PA-C  ?polyethylene glycol (MIRALAX / GLYCOLAX) 17 g packet Take 17 g by mouth daily. Available OTC 11/13/20   Rai, Vernelle Emerald, MD  ?rizatriptan (MAXALT) 10 MG tablet 1 tablet. 12/01/20   [provider]  ?VYVANSE 70 MG capsule Take 70 mg by mouth every morning. 11/01/20   [provider]  ?Wheat Dextrin (BENEFIBER DRINK MIX) PACK Take 1tbs daily. Also available  OTC 11/13/20   Rai, Ripudeep K, MD  ?albuterol (VENTOLIN HFA) 108 (90 Base) MCG/ACT inhaler Inhale 2 puffs into the lungs every 6 (six) hours as needed for wheezing or shortness of breath. ?Patient not taking: No sig reported 08/19/20 10/27/20  Chesley Mires, MD  ? ? ?Family History ?Family History  ?Problem Relation Age of Onset  ? Hypertension Sister   ? Hyperlipidemia Mother   ? Hypertension Mother   ? Hypertension Father   ? Hyperlipidemia Father   ? ? ?Social History ?Social History  ? ?Tobacco Use  ? Smoking status: Every Day  ?  Packs/day: 1.00  ?  Years: 27.00  ?  Pack years: 27.00  ?  Types: Cigarettes  ? Smokeless tobacco: Never  ? Tobacco comments:  ?  0.5ppd 08/19/2020  ?Vaping Use  ? Vaping Use: Never used  ?Substance Use Topics  ? Alcohol use: No  ? Drug use: Yes  ?  Types: Marijuana  ?  Comment: used Monday  ? ? ? ?Allergies   ?Patient has no known allergies. ? ? ?Review of Systems ?Review of Systems  ?Musculoskeletal:  Positive for arthralgias and joint swelling.  ?Skin:  Negative for color change, rash and wound.  ?Neurological:  Positive for numbness.  Negative for weakness.  ?All other systems reviewed and are negative. ? ? ?Physical Exam ?Triage Vital Signs ?ED Triage Vitals  ?Enc Vitals Group  ?   BP   ?   Pulse   ?   Resp   ?   Temp   ?   Temp src   ?   SpO2   ?   Weigh

## 2021-08-18 ENCOUNTER — Emergency Department (HOSPITAL_COMMUNITY)
Admission: EM | Admit: 2021-08-18 | Discharge: 2021-08-18 | Discharge status: Against Medical Advice | Payer: Medicaid Other | Attending: Emergency Medicine | Admitting: Emergency Medicine

## 2021-08-18 ENCOUNTER — Emergency Department (HOSPITAL_COMMUNITY): Payer: Medicaid Other

## 2021-08-18 ENCOUNTER — Encounter (HOSPITAL_COMMUNITY): Payer: Self-pay

## 2021-08-18 DIAGNOSIS — R079 Chest pain, unspecified: Secondary | ICD-10-CM | POA: Diagnosis not present

## 2021-08-18 DIAGNOSIS — R0602 Shortness of breath: Secondary | ICD-10-CM | POA: Insufficient documentation

## 2021-08-18 DIAGNOSIS — R531 Weakness: Secondary | ICD-10-CM | POA: Insufficient documentation

## 2021-08-18 DIAGNOSIS — R1032 Left lower quadrant pain: Secondary | ICD-10-CM | POA: Diagnosis not present

## 2021-08-18 DIAGNOSIS — K625 Hemorrhage of anus and rectum: Secondary | ICD-10-CM | POA: Insufficient documentation

## 2021-08-18 DIAGNOSIS — Z5321 Procedure and treatment not carried out due to patient leaving prior to being seen by health care provider: Secondary | ICD-10-CM | POA: Insufficient documentation

## 2021-08-18 LAB — COMPREHENSIVE METABOLIC PANEL
ALT: 21 U/L (ref 0–44)
AST: 16 U/L (ref 15–41)
Albumin: 4.2 g/dL (ref 3.5–5.0)
Alkaline Phosphatase: 53 U/L (ref 38–126)
Anion gap: 8 (ref 5–15)
BUN: 14 mg/dL (ref 6–20)
CO2: 25 mmol/L (ref 22–32)
Calcium: 10.3 mg/dL (ref 8.9–10.3)
Chloride: 106 mmol/L (ref 98–111)
Creatinine, Ser: 0.92 mg/dL (ref 0.44–1.00)
GFR, Estimated: >60 mL/min (ref >60)
Glucose, Bld: 93 mg/dL (ref 70–99)
Potassium: 4.9 mmol/L (ref 3.5–5.1)
Sodium: 139 mmol/L (ref 135–145)
Total Bilirubin: 0.7 mg/dL (ref 0.3–1.2)
Total Protein: 7.1 g/dL (ref 6.5–8.1)

## 2021-08-18 LAB — CBC WITH DIFFERENTIAL/PLATELET
Abs Immature Granulocytes: 0.02 10*3/uL (ref 0.00–0.07)
Basophils Absolute: 0.1 10*3/uL (ref 0.0–0.1)
Basophils Relative: 1 %
Eosinophils Absolute: 0.2 10*3/uL (ref 0.0–0.5)
Eosinophils Relative: 3 %
HCT: 41.5 % (ref 36.0–46.0)
Hemoglobin: 13.5 g/dL (ref 12.0–15.0)
Immature Granulocytes: 0 %
Lymphocytes Relative: 30 %
Lymphs Abs: 2.7 10*3/uL (ref 0.7–4.0)
MCH: 31.8 pg (ref 26.0–34.0)
MCHC: 32.5 g/dL (ref 30.0–36.0)
MCV: 97.9 fL (ref 80.0–100.0)
Monocytes Absolute: 0.7 10*3/uL (ref 0.1–1.0)
Monocytes Relative: 8 %
Neutro Abs: 5.1 10*3/uL (ref 1.7–7.7)
Neutrophils Relative %: 58 %
Platelets: 296 10*3/uL (ref 150–400)
RBC: 4.24 MIL/uL (ref 3.87–5.11)
RDW: 11.7 % (ref 11.5–15.5)
WBC: 8.8 10*3/uL (ref 4.0–10.5)
nRBC: 0 % (ref 0.0–0.2)

## 2021-08-18 LAB — TROPONIN I (HIGH SENSITIVITY): Troponin I (High Sensitivity): 12 ng/L (ref <18)

## 2021-08-18 NOTE — ED Triage Notes (Signed)
Pt arrived via POV, c/o SOB, central chest pain and low HR (60s) and abnormal hemoglobin.  ?

## 2021-08-18 NOTE — ED Notes (Signed)
Patient expressed frustration to staff due to prolonged wait times. This Probation officer explained that no rooms have been available to move patients into from the waiting room. Patient began yelling, stating that she had been sent by her doctor and had called ahead and had expected to have a room held for her. This Probation officer explained that unfortunately we cannot hold rooms and the patient stomped away and threw her BP cuff on the ground. Patient has been shouting at registration staff demanding her labwork results, Adela Lank RN explained to patient that only EDPs can give results. ?

## 2021-08-18 NOTE — ED Provider Triage Note (Signed)
Emergency Medicine Provider Triage Evaluation Note ? ?Victoria Pierce , a 41 y.o. female  was evaluated in triage.  Pt complains of general weakness, chest pain, shortness of breath, and rectal bleed.  States she was evaluated at primary care and her hemoglobin was 9.  States that she has had bright red and dark blood in her toilet recently.  States that she has left-sided chest pain and is unable to take a deep breath.  Patient also complains of lower left quadrant tenderness.  Patient denies nausea or vomiting ? ?Review of Systems  ?Positive: Chest pain, shortness of breath, rectal bleed ?Negative: Nausea ? ?Physical Exam  ?BP 132/87 (BP Location: Left Arm)   Pulse 77   Temp 97.8 ?F (36.6 ?C) (Oral)   Resp 20   LMP  (LMP Unknown)   SpO2 99%  ?Gen:   Awake, no distress   ?Resp:  Normal effort  ?MSK:   Moves extremities without difficulty  ?Other:   ? ?Medical Decision Making  ?Medically screening exam initiated at 2:38 PM.  Appropriate orders placed.  Victoria Pierce was informed that the remainder of the evaluation will be completed by another provider, this initial triage assessment does not replace that evaluation, and the importance of remaining in the ED until their evaluation is complete. ? ? ?  ?Dorothyann Peng, PA-C ?08/18/21 1505 ? ?

## 2021-09-14 ENCOUNTER — Encounter: Payer: Self-pay | Admitting: Emergency Medicine

## 2021-09-14 ENCOUNTER — Ambulatory Visit
Admission: EM | Admit: 2021-09-14 | Discharge: 2021-09-14 | Disposition: A | Discharge status: Home / Self Care | Payer: Medicaid Other | Attending: Emergency Medicine | Admitting: Emergency Medicine

## 2021-09-14 DIAGNOSIS — L03012 Cellulitis of left finger: Secondary | ICD-10-CM | POA: Diagnosis present

## 2021-09-14 MED ORDER — BACITRACIN ZINC 500 UNIT/GM EX OINT: 1.0000 "application " | TOPICAL_OINTMENT | Freq: Once | CUTANEOUS | Status: DC

## 2021-09-14 MED ORDER — CHLORHEXIDINE GLUCONATE 4 % EX LIQD: Freq: Every day | CUTANEOUS | 0 refills | Status: DC | PRN

## 2021-09-14 MED ORDER — DOXYCYCLINE HYCLATE 100 MG PO CAPS: 100.0000 mg | ORAL_CAPSULE | Freq: Two times a day (BID) | ORAL | 0 refills | Status: DC

## 2021-09-14 NOTE — ED Provider Notes (Addendum)
HPI  SUBJECTIVE:  Victoria Pierce is a right-handed 41 y.o. female who presents with 2 days of throbbing, constant pain, erythema, swelling along the radial nail fold of her left middle finger.  She reports redness streaking up her finger and swelling of the finger pad today.  No fevers, body aches.  She states that symptoms started after pulling the cuticle.  She does not bite her nails.  No recent manicure.  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  She tried peroxide without improvement in her symptoms.  No alleviating factors.  Symptoms worse palpation.  She has a past medical history of ulcerative colitis, GI bleed, diverticulitis, asthma.  No history of MRSA.  No known exposure to MRSA.  LMP: She is status post bilateral tubal ligation and has an IUD.  Denies possibility of being pregnant.  PCP: [facility].    Past Medical History:  Diagnosis Date   Anxiety    Asthma    Bipolar 1 disorder ([place])    Depression    Diverticulitis    GERD (gastroesophageal reflux disease)    Hernia, abdominal    Hiatal hernia    Symptomatic anemia 10/26/2020   Ulcerative colitis    Vaginal septum     Past Surgical History:  Procedure Laterality Date   CLOSED REDUCTION METACARPAL WITH PERCUTANEOUS PINNING Left 03/07/2018   Procedure: CLOSED REDUCTION METACARPAL WITH PERCUTANEOUS PINNING-5th metacarpal;  Surgeon: Corky Mull, MD;  Location: [facility];  Service: Orthopedics;  Laterality: Left;   COLONOSCOPY N/A 11/12/2020   Procedure: COLONOSCOPY;  Surgeon: Arta Silence, MD;  Location: [facility];  Service: Endoscopy;  Laterality: N/A;   COLONOSCOPY WITH PROPOFOL N/A 05/14/2018   Procedure: COLONOSCOPY WITH PROPOFOL;  Surgeon: Lin Landsman, MD;  Location: [facility] ENDOSCOPY;  Service: Gastroenterology;  Laterality: N/A;   ESOPHAGOGASTRODUODENOSCOPY (EGD) WITH PROPOFOL N/A 05/14/2018   Procedure: ESOPHAGOGASTRODUODENOSCOPY (EGD) WITH PROPOFOL;  Surgeon: Lin Landsman, MD;   Location: [facility]COPY;  Service: Gastroenterology;  Laterality: N/A;   ESOPHAGOGASTRODUODENOSCOPY (EGD) WITH PROPOFOL N/A 03/06/2019   Procedure: ESOPHAGOGASTRODUODENOSCOPY (EGD) WITH PROPOFOL;  Surgeon: Lin Landsman, MD;  Location: [facility] ENDOSCOPY;  Service: Gastroenterology;  Laterality: N/A;   ESOPHAGOGASTRODUODENOSCOPY (EGD) WITH PROPOFOL N/A 02/04/2020   Procedure: ESOPHAGOGASTRODUODENOSCOPY (EGD) WITH PROPOFOL;  Surgeon: Lin Landsman, MD;  Location: [facility] ENDOSCOPY;  Service: Gastroenterology;  Laterality: N/A;   FLEXIBLE SIGMOIDOSCOPY N/A 02/04/2020   Procedure: FLEXIBLE SIGMOIDOSCOPY;  Surgeon: Lin Landsman, MD;  Location: [facility] ENDOSCOPY;  Service: Gastroenterology;  Laterality: N/A;   FRACTURE SURGERY     HARDWARE REMOVAL Left 04/03/2018   Procedure: LEFT HAND FIFTH METACARPAL PIN REMOVAL;  Surgeon: Corky Mull, MD;  Location: [place];  Service: Orthopedics;  Laterality: Left;   HERNIA REPAIR     ROBOTIC ASSISTED LAPAROSCOPIC NISSEN FUNDOPLICATION N/A 01/01/9149   Procedure: ROBOTIC ASSISTED LAPAROSCOPIC NISSEN FUNDOPLICATION;  Surgeon: Jules Husbands, MD;  Location: [facility];  Service: General;  Laterality: N/A;   TRIGGER FINGER RELEASE Left 06/19/2018   Procedure: EXTENSOR TENOLYSIS WITH CAPSULAR RELEASE OF LEFT LITTLE MCP JOINT;  Surgeon: Corky Mull, MD;  Location: [place];  Service: Orthopedics;  Laterality: Left;   TUBAL LIGATION      Family History  Problem Relation Age of Onset   Hypertension Sister    Hyperlipidemia Mother    Hypertension Mother    Hypertension Father    Hyperlipidemia Father     Social History   Tobacco Use  Smoking status: Every Day    Packs/day: 1.00    Years: 27.00    Pack years: 27.00    Types: Cigarettes   Smokeless tobacco: Never   Tobacco comments:    0.5ppd 08/19/2020  Vaping Use   Vaping Use: Never used  Substance Use Topics   Alcohol use: No   Drug use: Yes    Types: Marijuana     Comment: used Monday     Current Facility-Administered Medications:    bacitracin ointment 1 application., 1 application., Topical, Once, Melynda Ripple, MD   levonorgestrel (MIRENA) 20 MCG/24HR IUD, , Intrauterine, Once, Schuman, Christanna R, MD  Current Outpatient Medications:    chlorhexidine (HIBICLENS) 4 % external liquid, Apply topically daily as needed. Dilute 10-15 mL in water, Use daily when bathing for 1-2 weeks, Disp: 120 mL, Rfl: 0   doxycycline (VIBRAMYCIN) 100 MG capsule, Take 1 capsule (100 mg total) by mouth 2 (two) times daily for 5 days., Disp: 10 capsule, Rfl: 0   ADDERALL XR 30 MG 24 hr capsule, Take 30 mg by mouth every morning., Disp: , Rfl:    ARIPiprazole (ABILIFY) 5 MG tablet, Take 5 mg by mouth daily., Disp: , Rfl:    benztropine (COGENTIN) 0.5 MG tablet, Take 0.5 mg by mouth at bedtime., Disp: , Rfl:    cyclobenzaprine (FLEXERIL) 10 MG tablet, Take 10 mg by mouth 3 (three) times daily as needed for muscle spasms. , Disp: , Rfl:    diazepam (VALIUM) 5 MG tablet, Take 2 tablets by mouth at bedtime., Disp: , Rfl:    ferrous sulfate 325 (65 FE) MG tablet, Take 1 tablet (325 mg total) by mouth daily., Disp: 30 tablet, Rfl: 0   gabapentin (NEURONTIN) 300 MG capsule, Take 900 mg by mouth 2 (two) times daily., Disp: , Rfl:    hydrocortisone (ANUSOL-HC) 2.5 % rectal cream, Place 1 application rectally 2 (two) times daily., Disp: 28 g, Rfl: 3   hydrocortisone (ANUSOL-HC) 25 MG suppository, Place 1 suppository (25 mg total) rectally 2 (two) times daily as needed for hemorrhoids or anal itching., Disp: 100 suppository, Rfl: 2   lamoTRIgine (LAMICTAL) 200 MG tablet, Take 200 mg by mouth daily., Disp: , Rfl:    ondansetron (ZOFRAN-ODT) 4 MG disintegrating tablet, Take 1 tablet (4 mg total) by mouth every 6 (six) hours as needed for nausea or vomiting., Disp: 40 tablet, Rfl: 0   polyethylene glycol (MIRALAX / GLYCOLAX) 17 g packet, Take 17 g by mouth daily. Available OTC, Disp:  30 each, Rfl: 3   propranolol (INDERAL) 20 MG tablet, Take 20 mg by mouth 4 (four) times daily., Disp: , Rfl:    QUEtiapine (SEROQUEL) 50 MG tablet, Take 150 mg by mouth at bedtime., Disp: , Rfl:    rizatriptan (MAXALT) 10 MG tablet, 1 tablet., Disp: , Rfl:    topiramate (TOPAMAX) 50 MG tablet, Take 50 mg by mouth 2 (two) times daily., Disp: , Rfl:    VYVANSE 70 MG capsule, Take 70 mg by mouth every morning., Disp: , Rfl:    Wheat Dextrin (BENEFIBER DRINK MIX) PACK, Take 1tbs daily. Also available OTC, Disp: 28 each, Rfl: 3  No Known Allergies   ROS  As noted in HPI.   Physical Exam  BP 119/79 (BP Location: Left Arm)   Pulse 79   Temp 99.1 F (37.3 C) (Oral)   Resp 18   SpO2 98%   Constitutional: Well developed, well nourished, no acute distress Eyes:  EOMI, conjunctiva  normal bilaterally HENT: Normocephalic, atraumatic,mucus membranes moist Respiratory: Normal inspiratory effort Cardiovascular: Normal rate GI: nondistended skin: Tender erythema, edema along the radial nail fold left middle finger.  No swelling of the finger pad.    Musculoskeletal: no deformities Neurologic: Alert & oriented x 3, no focal neuro deficits Psychiatric: Speech and behavior appropriate   ED Course  Medications  bacitracin ointment 1 application. (has no administration in time range)    Orders Placed This Encounter  Procedures   Aerobic Culture w Gram Stain (superficial specimen)    Standing Status:   Standing    Number of Occurrences:   1   Apply dressing    Standing Status:   Standing    Number of Occurrences:   1    No results found for this or any previous visit (from the past 24 hour(s)). No results found.  ED Clinical Impression  1. Paronychia of finger, left      ED Assessment/Plan  Patient with a paronychia.  No evidence of felon.  Will do a digital block as patient appears uncomfortable and attempt an I&D.  Procedure note: Had patient scrub finger with soap and  water.  Cleaned base of the finger with alcohol, put 0.5 cc of 1% plain lidocaine in each side with adequate anesthesia.  Then cleaned finger again with alcohol.  Using a sterile 18-gauge needle, made a 2 stab incisions, and expressed a moderate amount of pus/blood.  Culture sent.  Placed bacitracin and dressing on top of this.  Patient tolerated procedure well.  Home with doxycycline, Bactroban, Tylenol.  No ibuprofen because of history of GI bleed.  Follow-up with PCP or here as needed.  ER return precautions given.  Work note for 2 days.  Discussed MDM, treatment plan, and plan for follow-up with patient. Discussed sn/sx that should prompt return to the ED. patient agrees with plan.   Meds ordered this encounter  Medications   bacitracin ointment 1 application.   doxycycline (VIBRAMYCIN) 100 MG capsule    Sig: Take 1 capsule (100 mg total) by mouth 2 (two) times daily for 5 days.    Dispense:  10 capsule    Refill:  0   chlorhexidine (HIBICLENS) 4 % external liquid    Sig: Apply topically daily as needed. Dilute 10-15 mL in water, Use daily when bathing for 1-2 weeks    Dispense:  120 mL    Refill:  0      *This clinic note was created using [employer]. Therefore, there may be occasional mistakes despite careful proofreading.  ?    Melynda Ripple, MD 09/14/21 Carola Rhine, MD 09/14/21 2000

## 2021-09-14 NOTE — Discharge Instructions (Addendum)
Keep this clean and dry for the next 48 hours.  Then you can soak it in diluted Hibiclens once or twice a day.  Keep it covered until it heals while at work, but give it some dry time at night.  Finish the doxycycline, even if you feel better.  I have decided to send you home on doxycycline instead of Keflex/Bactroban because there was pus.  Your wound culture will be back in several days, we will change your antibiotics if necessary.  May take  1000 mg of Tylenol 3-4 times a day as needed for pain.

## 2021-09-14 NOTE — ED Triage Notes (Signed)
Pt presents with left middle finger infection x 3 days.

## 2021-09-18 ENCOUNTER — Telehealth: Payer: Self-pay | Admitting: Emergency Medicine

## 2021-09-18 LAB — AEROBIC CULTURE W GRAM STAIN (SUPERFICIAL SPECIMEN): Gram Stain: NONE SEEN

## 2021-09-18 MED ORDER — PENICILLIN V POTASSIUM 500 MG PO TABS: 500.0000 mg | ORAL_TABLET | Freq: Three times a day (TID) | ORAL | 0 refills | Status: AC

## 2021-09-18 NOTE — Telephone Encounter (Signed)
Results for orders placed or performed during the hospital encounter of 09/14/21  Aerobic Culture w Gram Stain (superficial specimen)   Specimen: Wound  Result Value Ref Range   Specimen Description WOUND    Special Requests LEFT MIDDLE FINGER    Gram Stain      NO SQUAMOUS EPITHELIAL CELLS SEEN FEW WBC SEEN FEW GRAM NEGATIVE RODS MODERATE GRAM POSITIVE COCCI    Culture      MODERATE EIKENELLA CORRODENS Usually susceptible to penicillin and other beta lactam agents,quinolones,macrolides and tetracyclines. MODERATE STREPTOCOCCUS ANGINOSIS MODERATE STREPTOCOCCUS CONSTELLATUS SUSCEPTIBILITIES TO FOLLOW    Report Status PENDING    Organism ID, Bacteria STREPTOCOCCUS ANGINOSIS       Susceptibility   Streptococcus anginosis - MIC*    PENICILLIN <=0.06 SENSITIVE Sensitive     CEFTRIAXONE 0.25 SENSITIVE Sensitive     ERYTHROMYCIN <=0.12 SENSITIVE Sensitive     LEVOFLOXACIN 0.5 SENSITIVE Sensitive     VANCOMYCIN Value in next row Sensitive      0.5 SENSITIVEPerformed at [facility] [address], [place], [place] [address]    * MODERATE STREPTOCOCCUS ANGINOSIS    Culture positive for Eikenella corrodens and strep anginosus, both of which are susceptible to penicillin.  We will E prescribe 500 mg of penicillin V 3 times daily for 10 days.    Attempted to leave voicemail on 6/4 at 11:21 AM.  Mailbox is full.  Unable to leave message.  Will have staff continue to try and contact patient and let her know of lab results and a prescription waiting for her at [facility] on [address] in [place]

## 2021-09-20 ENCOUNTER — Telehealth (HOSPITAL_COMMUNITY): Payer: Self-pay | Admitting: Emergency Medicine

## 2021-09-20 NOTE — Telephone Encounter (Signed)
Reviewed provider comments and medication changes with patient, verified pharmacy, she has picked it up

## 2021-09-21 ENCOUNTER — Encounter: Payer: Self-pay | Admitting: Emergency Medicine

## 2021-09-21 ENCOUNTER — Ambulatory Visit
Admission: EM | Admit: 2021-09-21 | Discharge: 2021-09-21 | Disposition: A | Discharge status: Home / Self Care | Payer: Medicaid Other

## 2021-09-21 DIAGNOSIS — L03012 Cellulitis of left finger: Secondary | ICD-10-CM

## 2021-09-21 NOTE — Discharge Instructions (Addendum)
Continue the penicillin as prescribed.  Keep your wound clean and dry.  Wash it gently twice a day with soap and water.  Follow-up with your primary care provider for wound recheck in 2 days.

## 2021-09-21 NOTE — ED Triage Notes (Signed)
Pt was seen 5/31 for left middle finger infection. Pt states the skin is starting to peel and she is concerned.

## 2021-09-21 NOTE — ED Provider Notes (Signed)
Roderic Palau    CSN: [id number] Arrival date & time: 09/21/21  1837      History   Chief Complaint Chief Complaint  Patient presents with   Wound Check    HPI Victoria Pierce is a 41 y.o. female.  Patient presents with peeling skin on her left middle finger x 1 day.  No fever, chills, wound drainage, rash, or other symptoms.  She is being treated for paronychia.  Patient was seen at this urgent care on 09/14/2021; diagnosed with paronychia of left middle finger; I&D performed; treated with doxycycline and Bactroban.  She has been cleaning the area with Hibiclens.  Based on wound culture, antibiotic changed to penicillin on 09/18/2021.      The history is provided by the patient and medical records.   Past Medical History:  Diagnosis Date   Anxiety    Asthma    Bipolar 1 disorder ([place])    Depression    Diverticulitis    GERD (gastroesophageal reflux disease)    Hernia, abdominal    Hiatal hernia    Symptomatic anemia 10/26/2020   Ulcerative colitis    Vaginal septum     Patient Active Problem List   Diagnosis Date Noted   Acute GI bleeding 11/11/2020   Hypokalemia 11/10/2020   Diverticulosis 10/27/2020   Asthma 10/27/2020   Shortness of breath 06/18/2020   Chest pain of uncertain etiology 41/66/0630   Left breast lump 06/18/2020   Non-intractable vomiting    Rectal bleeding    Tobacco use disorder 08/11/2019   S/P repair of paraesophageal hernia 08/07/2019   History of Nissen fundoplication 16/04/930   Chronic pelvic pain in female 06/14/2018   Dysmenorrhea 06/14/2018   Menorrhagia with regular cycle 06/14/2018   Contracture of finger joint, 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   Closed displaced fracture of neck of fifth metacarpal bone of left hand 03/07/2018   Chronic GERD    Anxiety     Past Surgical History:  Procedure Laterality Date   CLOSED REDUCTION METACARPAL WITH PERCUTANEOUS PINNING Left 03/07/2018   Procedure: CLOSED REDUCTION METACARPAL WITH  PERCUTANEOUS PINNING-5th metacarpal;  Surgeon: Corky Mull, MD;  Location: [facility];  Service: Orthopedics;  Laterality: Left;   COLONOSCOPY N/A 11/12/2020   Procedure: COLONOSCOPY;  Surgeon: Arta Silence, MD;  Location: [facility];  Service: Endoscopy;  Laterality: N/A;   COLONOSCOPY WITH PROPOFOL N/A 05/14/2018   Procedure: COLONOSCOPY WITH PROPOFOL;  Surgeon: Lin Landsman, MD;  Location: [facility] ENDOSCOPY;  Service: Gastroenterology;  Laterality: N/A;   ESOPHAGOGASTRODUODENOSCOPY (EGD) WITH PROPOFOL N/A 05/14/2018   Procedure: ESOPHAGOGASTRODUODENOSCOPY (EGD) WITH PROPOFOL;  Surgeon: Lin Landsman, MD;  Location: [facility] ENDOSCOPY;  Service: Gastroenterology;  Laterality: N/A;   ESOPHAGOGASTRODUODENOSCOPY (EGD) WITH PROPOFOL N/A 03/06/2019   Procedure: ESOPHAGOGASTRODUODENOSCOPY (EGD) WITH PROPOFOL;  Surgeon: Lin Landsman, MD;  Location: [facility] ENDOSCOPY;  Service: Gastroenterology;  Laterality: N/A;   ESOPHAGOGASTRODUODENOSCOPY (EGD) WITH PROPOFOL N/A 02/04/2020   Procedure: ESOPHAGOGASTRODUODENOSCOPY (EGD) WITH PROPOFOL;  Surgeon: Lin Landsman, MD;  Location: [facility] ENDOSCOPY;  Service: Gastroenterology;  Laterality: N/A;   FLEXIBLE SIGMOIDOSCOPY N/A 02/04/2020   Procedure: FLEXIBLE SIGMOIDOSCOPY;  Surgeon: Lin Landsman, MD;  Location: [facility] ENDOSCOPY;  Service: Gastroenterology;  Laterality: N/A;   FRACTURE SURGERY     HARDWARE REMOVAL Left 04/03/2018   Procedure: LEFT HAND FIFTH METACARPAL PIN REMOVAL;  Surgeon: Corky Mull, MD;  Location: [place];  Service: Orthopedics;  Laterality: Left;   HERNIA REPAIR     ROBOTIC ASSISTED  LAPAROSCOPIC NISSEN FUNDOPLICATION N/A 5/32/0233   Procedure: ROBOTIC ASSISTED LAPAROSCOPIC NISSEN FUNDOPLICATION;  Surgeon: Jules Husbands, MD;  Location: [facility];  Service: General;  Laterality: N/A;   TRIGGER FINGER RELEASE Left 06/19/2018   Procedure: EXTENSOR TENOLYSIS WITH CAPSULAR RELEASE OF LEFT LITTLE MCP JOINT;   Surgeon: Corky Mull, MD;  Location: [place];  Service: Orthopedics;  Laterality: Left;   TUBAL LIGATION      OB History     Gravida  2   Para  2   Term  2   Preterm      AB      Living  2      SAB      IAB      Ectopic      Multiple      Live Births  2            Home Medications    Prior to Admission medications   Medication Sig Start Date End Date Taking? Authorizing Provider  ADDERALL XR 30 MG 24 hr capsule Take 30 mg by mouth every morning. 07/20/21   [provider]  ARIPiprazole (ABILIFY) 5 MG tablet Take 5 mg by mouth daily. 07/29/21   [provider]  benztropine (COGENTIN) 0.5 MG tablet Take 0.5 mg by mouth at bedtime. 07/20/21   [provider]  chlorhexidine (HIBICLENS) 4 % external liquid Apply topically daily as needed. Dilute 10-15 mL in water, Use daily when bathing for 1-2 weeks 09/14/21   Melynda Ripple, MD  cyclobenzaprine (FLEXERIL) 10 MG tablet Take 10 mg by mouth 3 (three) times daily as needed for muscle spasms.     [provider]  diazepam (VALIUM) 5 MG tablet Take 2 tablets by mouth at bedtime. 12/08/20   [provider]  ferrous sulfate 325 (65 FE) MG tablet Take 1 tablet (325 mg total) by mouth daily. 11/03/20   Long, Wonda Olds, MD  gabapentin (NEURONTIN) 300 MG capsule Take 900 mg by mouth 2 (two) times daily. 10/08/20   [provider]  hydrocortisone (ANUSOL-HC) 2.5 % rectal cream Place 1 application rectally 2 (two) times daily. 11/13/20   Rai, Vernelle Emerald, MD  hydrocortisone (ANUSOL-HC) 25 MG suppository Place 1 suppository (25 mg total) rectally 2 (two) times daily as needed for hemorrhoids or anal itching. 11/13/20   Rai, Vernelle Emerald, MD  lamoTRIgine (LAMICTAL) 200 MG tablet Take 200 mg by mouth daily. 11/01/20   [provider]  ondansetron (ZOFRAN-ODT) 4 MG disintegrating tablet Take 1 tablet (4 mg total) by mouth every 6 (six) hours as needed for nausea or  vomiting. 09/27/18   Tylene Fantasia, PA-C  penicillin v potassium (VEETID) 500 MG tablet Take 1 tablet (500 mg total) by mouth 3 (three) times daily for 10 days. 09/18/21 09/28/21  Melynda Ripple, MD  polyethylene glycol (MIRALAX / GLYCOLAX) 17 g packet Take 17 g by mouth daily. Available OTC 11/13/20   Rai, Vernelle Emerald, MD  propranolol (INDERAL) 20 MG tablet Take 20 mg by mouth 4 (four) times daily. 06/16/19   [provider]  QUEtiapine (SEROQUEL) 50 MG tablet Take 150 mg by mouth at bedtime. 05/25/20   [provider]  rizatriptan (MAXALT) 10 MG tablet 1 tablet. 12/01/20   [provider]  topiramate (TOPAMAX) 50 MG tablet Take 50 mg by mouth 2 (two) times daily.    [provider]  VYVANSE 70 MG capsule Take 70 mg by mouth every morning. 11/01/20  [provider]  Wheat Dextrin (BENEFIBER DRINK MIX) PACK Take 1tbs daily. Also available OTC 11/13/20   Rai, Ripudeep K, MD  albuterol (VENTOLIN HFA) 108 (90 Base) MCG/ACT inhaler Inhale 2 puffs into the lungs every 6 (six) hours as needed for wheezing or shortness of breath. Patient not taking: No sig reported 08/19/20 10/27/20  Chesley Mires, MD    Family History Family History  Problem Relation Age of Onset   Hypertension Sister    Hyperlipidemia Mother    Hypertension Mother    Hypertension Father    Hyperlipidemia Father     Social History Social History   Tobacco Use   Smoking status: Every Day    Packs/day: 1.00    Years: 27.00    Pack years: 27.00    Types: Cigarettes   Smokeless tobacco: Never   Tobacco comments:    0.5ppd 08/19/2020  Vaping Use   Vaping Use: Never used  Substance Use Topics   Alcohol use: No   Drug use: Yes    Types: Marijuana    Comment: used Monday     Allergies   Patient has no known allergies.   Review of Systems Review of Systems  Constitutional:  Negative for chills and fever.  Musculoskeletal:  Negative for arthralgias and joint swelling.  Skin:   Positive for color change and wound.  All other systems reviewed and are negative.   Physical Exam Triage Vital Signs ED Triage Vitals  Enc Vitals Group     BP      Pulse      Resp      Temp      Temp src      SpO2      Weight      Height      Head Circumference      Peak Flow      Pain Score      Pain Loc      Pain Edu?      Excl. in [place]?    No data found.  Updated Vital Signs BP 129/83   Pulse (!) 105   Temp 97.9 F (36.6 C)   Resp 18   SpO2 98%   Visual Acuity Right Eye Distance:   Left Eye Distance:   Bilateral Distance:    Right Eye Near:   Left Eye Near:    Bilateral Near:     Physical Exam Vitals and nursing note reviewed.  Constitutional:      General: She is not in acute distress.    Appearance: Normal appearance. She is well-developed. She is not ill-appearing.  HENT:     Mouth/Throat:     Mouth: Mucous membranes are moist.  Cardiovascular:     Rate and Rhythm: Normal rate and regular rhythm.  Pulmonary:     Effort: Pulmonary effort is normal. No respiratory distress.  Musculoskeletal:        General: Normal range of motion.     Cervical back: Neck supple.  Skin:    General: Skin is warm and dry.     Capillary Refill: Capillary refill takes less than 2 seconds.     Findings: Lesion present.     Comments: Erythema and peeling skin on left middle finger; no drainage.  See pictures.   Neurological:     General: No focal deficit present.     Mental Status: She is alert and oriented to person, place, and time.     Sensory: No sensory deficit.  Motor: No weakness.  Psychiatric:        Mood and Affect: Mood normal.        Behavior: Behavior normal.        UC Treatments / Results  Labs (all labs ordered are listed, but only abnormal results are displayed) Labs Reviewed - No data to display  EKG   Radiology No results found.  Procedures Procedures (including critical care time)  Medications Ordered in UC Medications - No  data to display  Initial Impression / Assessment and Plan / UC Course  I have reviewed the triage vital signs and the nursing notes.  Pertinent labs & imaging results that were available during my care of the patient were reviewed by me and considered in my medical decision making (see chart for details).  Paronychia of left middle finger.  The area on the end of her finger has peeling skin but appears to be healing.  Instructed patient to stop cleaning with Hibiclens and stop applying Bactroban ointment as these may be contributing to her symptoms.  Instructed her to continue taking the penicillin as prescribed.  Instructed her to clean the area gently twice a day with soap and water.  Instructed her to follow-up with her PCP or return here in 2 days for wound recheck.  Follow-up sooner if her symptoms worsen.  Work note provided per patient request.  Patient agrees to plan of care.   Final Clinical Impressions(s) / UC Diagnoses   Final diagnoses:  Paronychia of left middle finger     Discharge Instructions      Continue the penicillin as prescribed.  Keep your wound clean and dry.  Wash it gently twice a day with soap and water.  Follow-up with your primary care provider for wound recheck in 2 days.     ED Prescriptions   None    PDMP not reviewed this encounter.   Sharion Balloon, NP 09/21/21 1912

## 2021-11-14 ENCOUNTER — Other Ambulatory Visit: Payer: Self-pay

## 2021-11-16 ENCOUNTER — Ambulatory Visit: Payer: Medicaid Other | Admitting: Gastroenterology
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
## 2021-12-27 ENCOUNTER — Ambulatory Visit (INDEPENDENT_AMBULATORY_CARE_PROVIDER_SITE_OTHER): Payer: No Payment, Other | Admitting: Psychiatry

## 2021-12-27 ENCOUNTER — Other Ambulatory Visit: Payer: Self-pay

## 2021-12-27 ENCOUNTER — Encounter (HOSPITAL_COMMUNITY): Payer: Self-pay | Admitting: Psychiatry

## 2021-12-27 ENCOUNTER — Encounter (HOSPITAL_COMMUNITY): Payer: Self-pay

## 2021-12-27 ENCOUNTER — Encounter (HOSPITAL_COMMUNITY): Payer: Self-pay | Admitting: Mental Health

## 2021-12-27 ENCOUNTER — Ambulatory Visit (INDEPENDENT_AMBULATORY_CARE_PROVIDER_SITE_OTHER): Payer: No Payment, Other | Admitting: Mental Health

## 2021-12-27 VITALS — BP 138/79 | HR 115 | Ht 63.0 in | Wt 164.0 lb

## 2021-12-27 DIAGNOSIS — R41840 Attention and concentration deficit: Secondary | ICD-10-CM

## 2021-12-27 DIAGNOSIS — F4310 Post-traumatic stress disorder, unspecified: Secondary | ICD-10-CM

## 2021-12-27 DIAGNOSIS — Z8659 Personal history of other mental and behavioral disorders: Secondary | ICD-10-CM

## 2021-12-27 DIAGNOSIS — F3181 Bipolar II disorder: Secondary | ICD-10-CM

## 2021-12-27 DIAGNOSIS — F1210 Cannabis abuse, uncomplicated: Secondary | ICD-10-CM

## 2021-12-27 DIAGNOSIS — F411 Generalized anxiety disorder: Secondary | ICD-10-CM

## 2021-12-27 MED ORDER — BENZTROPINE MESYLATE 0.5 MG PO TABS
0.5000 mg | ORAL_TABLET | Freq: Every day | ORAL | 2 refills | Status: AC
  Filled 2021-12-27: qty 30, 30d supply, fill #0
  Filled 2022-01-20 (×2): qty 30, 30d supply, fill #1
  Filled 2022-02-06 – 2022-12-11 (×14): qty 30, 30d supply, fill #2

## 2021-12-27 MED ORDER — GUANFACINE HCL ER 1 MG PO TB24
1.0000 mg | ORAL_TABLET | Freq: Every day | ORAL | 2 refills | Status: DC
  Filled 2021-12-27: qty 30, 30d supply, fill #0

## 2021-12-27 MED ORDER — PROPRANOLOL HCL 20 MG PO TABS
20.0000 mg | ORAL_TABLET | Freq: Two times a day (BID) | ORAL | 2 refills | Status: DC | PRN
  Filled 2021-12-27: qty 60, 30d supply, fill #0
  Filled 2022-01-20 (×2): qty 60, 30d supply, fill #1
  Filled 2022-02-06 – 2022-05-02 (×7): qty 60, 30d supply, fill #2

## 2021-12-27 MED ORDER — ARIPIPRAZOLE 10 MG PO TABS
ORAL_TABLET | ORAL | 2 refills | Status: DC
  Filled 2021-12-27: qty 30, 33d supply, fill #0
  Filled 2022-01-20 (×2): qty 30, 33d supply, fill #1
  Filled 2022-02-06: qty 30, 33d supply, fill #2
  Filled 2022-02-13: qty 30, 30d supply, fill #2

## 2021-12-27 MED ORDER — GUANFACINE HCL ER 1 MG PO TB24
1.0000 mg | ORAL_TABLET | Freq: Every evening | ORAL | 2 refills | Status: DC
  Filled 2021-12-27: qty 30, 30d supply, fill #0
  Filled 2022-01-20 (×2): qty 30, 30d supply, fill #1
  Filled 2022-02-06 – 2022-03-12 (×6): qty 30, 30d supply, fill #2

## 2021-12-27 MED ORDER — LAMOTRIGINE 25 MG PO TABS
ORAL_TABLET | ORAL | 0 refills | Status: DC
  Filled 2021-12-27: qty 100, 42d supply, fill #0

## 2021-12-27 NOTE — Patient Instructions (Addendum)
Thank you for attending your appointment today.  -- RESTART lamotrigine titration:  - Weeks 1 and 2: Take 25 mg daily - Weeks 3 and 4: Take 50 mg daily - Week 5: Take 100 mg daily until your next follow-up appointment -- START guanfacine ER 1 mg nightly -- Continue Abilify 5 mg daily for 1 week then INCREASE to 10 mg daily -- Continue propranolol 20 mg up to twice a day as needed for anxiety -- Continue cogentin 0.5 mg nightly  Please do not make any changes to medications without first discussing with your provider. If you are experiencing a psychiatric emergency, please call 911 or present to your nearest emergency department. Additional crisis, medication management, and therapy resources are included below.  [facility]  [address] [place], [place] [address] [phone] or [phone] [facility] 24/7 FOR ANYONE [address] [place], [place] Fax: [phone] [email or web address] *Interpreters available *Accepts all insurance and uninsured for Urgent Care needs *Accepts Medicaid and uninsured for outpatient treatment (below)      ONLY FOR [facility]  Below:    Outpatient New Patient Assessment/Therapy Walk-ins:        Monday -Thursday 8am until slots are full.        Every Friday 1pm-4pm  (first come, first served)                   New Patient Psychiatry/Medication Management        Monday-Friday 8am-11am (first come, first served)               For all walk-ins we ask that you arrive by 7:15am, because patients will be seen in the order of arrival.
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
## 2021-12-27 NOTE — Plan of Care (Signed)
  Problem: Anxiety Disorder CCP Problem  1  Goal: LTG: Victoria Pierce will score less than 5 on the Generalized Anxiety Disorder 7 Scale (GAD-7) Outcome: Initial Goal: STG: Victoria Pierce will decrease sxs of anxiety AEB engagement in relaxation and grounding skills daily within the next 6 months  Outcome: Initial   Problem: Bipolar Disorder CCP Problem  1  Goal: LTG: Stabilize mood and increase goal-directed behavior PHQ of 5 or below Outcome: Initial Goal: STG: [name]" will increase stability in moods AEB development of x 3 effective coping skills with ability to reframe maladaptive thinking patterns 7/7 days within the next 6 months  Outcome: Initial Goal: STG: [name] "Victoria Pierce" increase management of stress AEB x 3 development of distress tolerance and problem solving skills within the next 6 months  Outcome: Initial   Problem: Anxiety Disorder CCP Problem  1  Goal: LTG: Victoria Pierce will score less than 5 on the Generalized Anxiety Disorder 7 Scale (GAD-7) Outcome: Initial Goal: STG: Victoria Pierce will decrease sxs of anxiety AEB engagement in relaxation and grounding skills daily within the next 6 months  Outcome: Initial

## 2021-12-28 ENCOUNTER — Other Ambulatory Visit: Payer: Self-pay

## 2021-12-30 ENCOUNTER — Other Ambulatory Visit: Payer: Self-pay

## 2022-01-02 ENCOUNTER — Other Ambulatory Visit: Payer: Self-pay

## 2022-01-04 ENCOUNTER — Other Ambulatory Visit: Payer: Self-pay

## 2022-01-05 ENCOUNTER — Other Ambulatory Visit: Payer: Self-pay

## 2022-01-05 MED ORDER — GABAPENTIN 800 MG PO TABS
800.0000 mg | ORAL_TABLET | Freq: Three times a day (TID) | ORAL | 1 refills | Status: DC
  Filled 2022-01-05 – 2022-01-20 (×7): qty 90, 30d supply, fill #0
  Filled 2022-02-06 – 2022-02-16 (×5): qty 90, 30d supply, fill #1

## 2022-01-06 ENCOUNTER — Other Ambulatory Visit: Payer: Self-pay

## 2022-01-09 ENCOUNTER — Other Ambulatory Visit: Payer: Self-pay

## 2022-01-12 ENCOUNTER — Encounter (HOSPITAL_COMMUNITY): Payer: Self-pay

## 2022-01-12 ENCOUNTER — Ambulatory Visit (HOSPITAL_COMMUNITY): Payer: No Payment, Other | Admitting: Mental Health

## 2022-01-13 ENCOUNTER — Other Ambulatory Visit: Payer: Self-pay

## 2022-01-13 ENCOUNTER — Other Ambulatory Visit (HOSPITAL_COMMUNITY): Payer: Self-pay

## 2022-01-18 ENCOUNTER — Other Ambulatory Visit (HOSPITAL_COMMUNITY): Payer: Self-pay

## 2022-01-20 ENCOUNTER — Other Ambulatory Visit (HOSPITAL_COMMUNITY): Payer: Self-pay | Admitting: Psychiatry

## 2022-01-20 ENCOUNTER — Other Ambulatory Visit (HOSPITAL_COMMUNITY): Payer: Self-pay

## 2022-01-20 ENCOUNTER — Other Ambulatory Visit: Payer: Self-pay

## 2022-01-23 ENCOUNTER — Other Ambulatory Visit (HOSPITAL_COMMUNITY): Payer: Self-pay

## 2022-01-24 ENCOUNTER — Other Ambulatory Visit: Payer: Self-pay

## 2022-02-02 ENCOUNTER — Other Ambulatory Visit (HOSPITAL_COMMUNITY): Payer: Self-pay

## 2022-02-06 ENCOUNTER — Other Ambulatory Visit (HOSPITAL_COMMUNITY): Payer: Self-pay

## 2022-02-06 NOTE — Progress Notes (Signed)
Patient did not connect for virtual psychiatric medication management appointment on 02/07/22 at 9:30AM. Called phone with no answer; unable to leave VM.  Alda Berthold, MD 02/07/22

## 2022-02-07 ENCOUNTER — Encounter (HOSPITAL_COMMUNITY): Payer: No Payment, Other | Admitting: Psychiatry

## 2022-02-07 ENCOUNTER — Encounter (HOSPITAL_COMMUNITY): Payer: Self-pay

## 2022-02-08 ENCOUNTER — Encounter (HOSPITAL_COMMUNITY): Payer: Self-pay | Admitting: Pharmacist

## 2022-02-08 ENCOUNTER — Other Ambulatory Visit (HOSPITAL_COMMUNITY): Payer: Self-pay

## 2022-02-08 ENCOUNTER — Encounter (HOSPITAL_COMMUNITY): Payer: Self-pay | Admitting: Psychiatry

## 2022-02-08 ENCOUNTER — Other Ambulatory Visit: Payer: Self-pay

## 2022-02-13 ENCOUNTER — Other Ambulatory Visit: Payer: Self-pay

## 2022-02-15 ENCOUNTER — Other Ambulatory Visit: Payer: Self-pay

## 2022-02-16 ENCOUNTER — Other Ambulatory Visit: Payer: Self-pay

## 2022-02-17 ENCOUNTER — Other Ambulatory Visit: Payer: Self-pay

## 2022-02-23 ENCOUNTER — Other Ambulatory Visit (HOSPITAL_COMMUNITY): Payer: Self-pay

## 2022-02-24 ENCOUNTER — Other Ambulatory Visit (HOSPITAL_COMMUNITY): Payer: Self-pay

## 2022-02-28 ENCOUNTER — Other Ambulatory Visit: Payer: Self-pay

## 2022-03-05 ENCOUNTER — Other Ambulatory Visit: Payer: Self-pay

## 2022-03-06 ENCOUNTER — Other Ambulatory Visit: Payer: Self-pay

## 2022-03-06 MED ORDER — GABAPENTIN 800 MG PO TABS
800.0000 mg | ORAL_TABLET | Freq: Three times a day (TID) | ORAL | 5 refills | Status: DC
  Filled 2022-03-06 – 2022-03-12 (×3): qty 90, 30d supply, fill #0
  Filled 2022-03-31 – 2022-04-05 (×2): qty 90, 30d supply, fill #1
  Filled 2022-04-21 – 2022-04-29 (×3): qty 90, 30d supply, fill #2
  Filled 2022-05-16 – 2022-05-22 (×2): qty 90, 30d supply, fill #3
  Filled 2022-06-06 – 2022-09-14 (×7): qty 90, 30d supply, fill #4
  Filled 2022-10-11 – 2023-02-16 (×4): qty 90, 30d supply, fill #5
  Filled ????-??-??: fill #4

## 2022-03-10 ENCOUNTER — Other Ambulatory Visit: Payer: Self-pay

## 2022-03-12 ENCOUNTER — Other Ambulatory Visit (HOSPITAL_COMMUNITY): Payer: Self-pay | Admitting: Psychiatry

## 2022-03-13 ENCOUNTER — Other Ambulatory Visit: Payer: Self-pay

## 2022-03-14 ENCOUNTER — Other Ambulatory Visit: Payer: Self-pay

## 2022-03-14 MED ORDER — ARIPIPRAZOLE 10 MG PO TABS
10.0000 mg | ORAL_TABLET | Freq: Every day | ORAL | 2 refills | Status: AC
Start: 2022-03-14 — End: ?
  Filled 2022-03-14 – 2023-03-16 (×10): qty 30, 30d supply, fill #0

## 2022-03-14 NOTE — Telephone Encounter (Signed)
Received refill request from pharmacy for Abilify 10 mg daily and Lamotrigine 100 mg daily. Patient has not followed up with this provider since initial assessment on 12/27/21. Per dispense report, has been regularly filling Abilify but has not filled lamotrigine since 12/27/21 due to lack of follow up. Will send in refill for Abilify and have front desk reach out to patient to discuss need to schedule follow-up appointment to discuss lamotrigine refill/dosing.  Alda Berthold, MD 03/14/22

## 2022-03-15 ENCOUNTER — Encounter (HOSPITAL_COMMUNITY): Payer: No Payment, Other | Admitting: Psychiatry

## 2022-03-15 ENCOUNTER — Encounter (HOSPITAL_COMMUNITY): Payer: Self-pay

## 2022-03-15 NOTE — Progress Notes (Signed)
Patient did not connect for virtual psychiatric medication management appointment on 03/15/22 at [facility] phone with no answer; left VM with callback number to reschedule.  Alda Berthold, MD 03/15/22

## 2022-03-16 ENCOUNTER — Other Ambulatory Visit: Payer: Self-pay

## 2022-03-17 ENCOUNTER — Other Ambulatory Visit: Payer: Self-pay

## 2022-03-27 ENCOUNTER — Other Ambulatory Visit: Payer: Self-pay

## 2022-03-31 ENCOUNTER — Other Ambulatory Visit (HOSPITAL_COMMUNITY): Payer: Self-pay | Admitting: Psychiatry

## 2022-04-03 ENCOUNTER — Other Ambulatory Visit: Payer: Self-pay

## 2022-04-03 ENCOUNTER — Ambulatory Visit: Payer: Medicaid Other | Admitting: Family

## 2022-04-03 ENCOUNTER — Encounter: Payer: Self-pay | Admitting: Family

## 2022-04-03 NOTE — Progress Notes (Deleted)
New Patient Office Visit  Subjective:  Patient ID: Victoria Pierce, female    DOB: May 27, 1980  Age: 41 y.o. MRN: [id number]  CC: No chief complaint on file.   HPI Victoria Pierce presents for establishing care today.  Assessment & Plan:   Problem List Items Addressed This Visit   None   Subjective:    Outpatient Medications Prior to Visit  Medication Sig Dispense Refill   ADDERALL XR 30 MG 24 hr capsule Take 30 mg by mouth every morning. (Patient not taking: Reported on 12/27/2021)     ARIPiprazole (ABILIFY) 10 MG tablet Take 1 tablet (10 mg total) by mouth daily. 30 tablet 2   benztropine (COGENTIN) 0.5 MG tablet Take 1 tablet (0.5 mg total) by mouth at bedtime. 30 tablet 2   cyclobenzaprine (FLEXERIL) 10 MG tablet Take 10 mg by mouth 3 (three) times daily as needed for muscle spasms.      DULoxetine (CYMBALTA) 30 MG capsule Take 30 mg by mouth every morning.     ferrous sulfate 325 (65 FE) MG tablet Take 1 tablet (325 mg total) by mouth daily. (Patient not taking: Reported on 12/27/2021) 30 tablet 0   gabapentin (NEURONTIN) 800 MG tablet Take 800 mg by mouth 3 (three) times daily.     gabapentin (NEURONTIN) 800 MG tablet Take 1 tablet (800 mg total) by mouth 3 (three) times daily for pain 90 tablet 5   guanFACINE (INTUNIV) 1 MG TB24 ER tablet Take 1 tablet (1 mg total) by mouth at bedtime. 30 tablet 2   hydrocortisone (ANUSOL-HC) 2.5 % rectal cream Place 1 application rectally 2 (two) times daily. 28 g 3   hydrocortisone (ANUSOL-HC) 25 MG suppository Place 1 suppository (25 mg total) rectally 2 (two) times daily as needed for hemorrhoids or anal itching. 100 suppository 2   hydrocortisone 2.5 % cream hydrocortisone 2.5 % topical cream with perineal applicator  PLACE 1 APPLICATION RECTALLY 2 (TWO) TIMES DAILY.     lamoTRIgine (LAMICTAL) 25 MG tablet Weeks 1 and 2: Take 25 mg daily. / Weeks 3 and 4: Take 50 mg daily. / Week 5: Take 100 mg daily and continue at this dose. 100  tablet 0   naproxen (NAPROSYN) 500 MG tablet Take 500 mg by mouth daily as needed.     ondansetron (ZOFRAN-ODT) 4 MG disintegrating tablet Take 1 tablet (4 mg total) by mouth every 6 (six) hours as needed for nausea or vomiting. (Patient not taking: Reported on 12/27/2021) 40 tablet 0   polyethylene glycol (MIRALAX / GLYCOLAX) 17 g packet Take 17 g by mouth daily. Available OTC 30 each 3   propranolol (INDERAL) 20 MG tablet Take 1 tablet (20 mg total) by mouth 2 (two) times daily as needed (anxiety). 60 tablet 2   QUEtiapine (SEROQUEL) 100 MG tablet Take 100 mg by mouth at bedtime.     rizatriptan (MAXALT) 10 MG tablet 1 tablet as needed for migraine.     SYMBICORT 160-4.5 MCG/ACT inhaler Inhale into the lungs. (Patient not taking: Reported on 12/27/2021)     topiramate (TOPAMAX) 50 MG tablet Take 50 mg by mouth 2 (two) times daily. (Patient not taking: Reported on 12/27/2021)     VYVANSE 70 MG capsule Take 70 mg by mouth every morning. (Patient not taking: Reported on 12/27/2021)     Wheat Dextrin (BENEFIBER DRINK MIX) PACK Take 1tbs daily. Also available OTC 28 each 3   Facility-Administered Medications Prior to Visit  Medication Dose Route  Frequency Provider Last Rate Last Admin   levonorgestrel (MIRENA) 20 MCG/24HR IUD   Intrauterine Once Homero Fellers, MD       Past Medical History:  Diagnosis Date   Anxiety    Asthma    Bipolar 1 disorder ([place])    Chest pain of uncertain etiology 24/23/5361   Depression    Diverticulitis    GERD (gastroesophageal reflux disease)    Hernia, abdominal    Hiatal hernia    Hypokalemia 11/10/2020   Menorrhagia with regular cycle 06/14/2018   Non-intractable vomiting    Symptomatic anemia 10/26/2020   Ulcerative colitis    Vaginal septum    Past Surgical History:  Procedure Laterality Date   CLOSED REDUCTION METACARPAL WITH PERCUTANEOUS PINNING Left 03/07/2018   Procedure: CLOSED REDUCTION METACARPAL WITH PERCUTANEOUS PINNING-5th metacarpal;   Surgeon: Corky Mull, MD;  Location: [facility];  Service: Orthopedics;  Laterality: Left;   COLONOSCOPY N/A 11/12/2020   Procedure: COLONOSCOPY;  Surgeon: Arta Silence, MD;  Location: [facility];  Service: Endoscopy;  Laterality: N/A;   COLONOSCOPY WITH PROPOFOL N/A 05/14/2018   Procedure: COLONOSCOPY WITH PROPOFOL;  Surgeon: Lin Landsman, MD;  Location: [facility] ENDOSCOPY;  Service: Gastroenterology;  Laterality: N/A;   ESOPHAGOGASTRODUODENOSCOPY (EGD) WITH PROPOFOL N/A 05/14/2018   Procedure: ESOPHAGOGASTRODUODENOSCOPY (EGD) WITH PROPOFOL;  Surgeon: Lin Landsman, MD;  Location: [facility] ENDOSCOPY;  Service: Gastroenterology;  Laterality: N/A;   ESOPHAGOGASTRODUODENOSCOPY (EGD) WITH PROPOFOL N/A 03/06/2019   Procedure: ESOPHAGOGASTRODUODENOSCOPY (EGD) WITH PROPOFOL;  Surgeon: Lin Landsman, MD;  Location[facility]COPY;  Service: Gastroenterology;  Laterality: N/A;   ESOPHAGOGASTRODUODENOSCOPY (EGD) WITH PROPOFOL N/A 02/04/2020   Procedure: ESOPHAGOGASTRODUODENOSCOPY (EGD) WITH PROPOFOL;  Surgeon: Lin Landsman, MD;  Location: [facility] ENDOSCOPY;  Service: Gastroenterology;  Laterality: N/A;   FLEXIBLE SIGMOIDOSCOPY N/A 02/04/2020   Procedure: FLEXIBLE SIGMOIDOSCOPY;  Surgeon: Lin Landsman, MD;  Location: [facility] ENDOSCOPY;  Service: Gastroenterology;  Laterality: N/A;   FRACTURE SURGERY     HARDWARE REMOVAL Left 04/03/2018   Procedure: LEFT HAND FIFTH METACARPAL PIN REMOVAL;  Surgeon: Corky Mull, MD;  Location: [place];  Service: Orthopedics;  Laterality: Left;   HERNIA REPAIR     ROBOTIC ASSISTED LAPAROSCOPIC NISSEN FUNDOPLICATION N/A 4/43/1540   Procedure: ROBOTIC ASSISTED LAPAROSCOPIC NISSEN FUNDOPLICATION;  Surgeon: Jules Husbands, MD;  Location: [facility] ORS;  Service: General;  Laterality: N/A;   TRIGGER FINGER RELEASE Left 06/19/2018   Procedure: EXTENSOR TENOLYSIS WITH CAPSULAR RELEASE OF LEFT LITTLE MCP JOINT;  Surgeon: Corky Mull, MD;  Location:  [place];  Service: Orthopedics;  Laterality: Left;   TUBAL LIGATION      Objective:   Today's Vitals: There were no vitals taken for this visit.  Physical Exam  No orders of the defined types were placed in this encounter.   Jeanie Sewer, NP

## 2022-04-06 ENCOUNTER — Other Ambulatory Visit: Payer: Self-pay

## 2022-04-07 ENCOUNTER — Encounter (HOSPITAL_COMMUNITY): Payer: Self-pay

## 2022-04-07 ENCOUNTER — Other Ambulatory Visit (HOSPITAL_COMMUNITY): Payer: Self-pay

## 2022-04-11 ENCOUNTER — Other Ambulatory Visit: Payer: Self-pay

## 2022-04-21 ENCOUNTER — Other Ambulatory Visit: Payer: Self-pay

## 2022-04-21 ENCOUNTER — Other Ambulatory Visit (HOSPITAL_COMMUNITY): Payer: Self-pay

## 2022-04-21 ENCOUNTER — Other Ambulatory Visit (HOSPITAL_COMMUNITY): Payer: Self-pay | Admitting: Psychiatry

## 2022-04-25 MED ORDER — GUANFACINE HCL ER 1 MG PO TB24
1.0000 mg | ORAL_TABLET | Freq: Every evening | ORAL | 2 refills | Status: AC
  Filled 2022-04-25 – 2022-09-14 (×7): qty 30, 30d supply, fill #0
  Filled 2022-10-11 – 2023-02-16 (×4): qty 30, 30d supply, fill #1
  Filled 2023-03-16 – 2023-04-15 (×2): qty 30, 30d supply, fill #2

## 2022-04-25 NOTE — Telephone Encounter (Signed)
Message acknowledged and reviewed. Patient's medication to be e-prescribed to pharmacy of choice.

## 2022-04-26 ENCOUNTER — Other Ambulatory Visit: Payer: Self-pay

## 2022-04-27 ENCOUNTER — Other Ambulatory Visit: Payer: Self-pay

## 2022-04-27 ENCOUNTER — Other Ambulatory Visit (HOSPITAL_COMMUNITY): Payer: Self-pay

## 2022-04-28 ENCOUNTER — Other Ambulatory Visit (HOSPITAL_COMMUNITY): Payer: Self-pay

## 2022-04-28 ENCOUNTER — Encounter (HOSPITAL_COMMUNITY): Payer: Self-pay

## 2022-04-28 ENCOUNTER — Other Ambulatory Visit: Payer: Self-pay

## 2022-05-02 ENCOUNTER — Other Ambulatory Visit (HOSPITAL_COMMUNITY): Payer: Self-pay

## 2022-05-03 ENCOUNTER — Other Ambulatory Visit: Payer: Self-pay

## 2022-05-09 ENCOUNTER — Other Ambulatory Visit: Payer: Self-pay

## 2022-05-09 ENCOUNTER — Encounter (HOSPITAL_BASED_OUTPATIENT_CLINIC_OR_DEPARTMENT_OTHER): Payer: Self-pay

## 2022-05-09 ENCOUNTER — Emergency Department (HOSPITAL_BASED_OUTPATIENT_CLINIC_OR_DEPARTMENT_OTHER)
Admission: EM | Admit: 2022-05-09 | Discharge: 2022-05-09 | Disposition: A | Discharge status: Home / Self Care | Payer: Medicaid Other | Attending: Emergency Medicine | Admitting: Emergency Medicine

## 2022-05-09 DIAGNOSIS — K921 Melena: Secondary | ICD-10-CM

## 2022-05-09 DIAGNOSIS — K644 Residual hemorrhoidal skin tags: Secondary | ICD-10-CM | POA: Diagnosis not present

## 2022-05-09 DIAGNOSIS — K625 Hemorrhage of anus and rectum: Secondary | ICD-10-CM | POA: Diagnosis not present

## 2022-05-09 DIAGNOSIS — D649 Anemia, unspecified: Secondary | ICD-10-CM | POA: Diagnosis not present

## 2022-05-09 LAB — COMPREHENSIVE METABOLIC PANEL
ALT: 11 U/L (ref 0–44)
AST: 6 U/L — ABNORMAL LOW (ref 15–41)
Albumin: 4.1 g/dL (ref 3.5–5.0)
Alkaline Phosphatase: 47 U/L (ref 38–126)
Anion gap: 6 (ref 5–15)
BUN: 13 mg/dL (ref 6–20)
CO2: 24 mmol/L (ref 22–32)
Calcium: 9.4 mg/dL (ref 8.9–10.3)
Chloride: 106 mmol/L (ref 98–111)
Creatinine, Ser: 0.78 mg/dL (ref 0.44–1.00)
GFR, Estimated: >60 mL/min (ref >60)
Glucose, Bld: 92 mg/dL (ref 70–99)
Potassium: 3.7 mmol/L (ref 3.5–5.1)
Sodium: 136 mmol/L (ref 135–145)
Total Bilirubin: 0.2 mg/dL — ABNORMAL LOW (ref 0.3–1.2)
Total Protein: 6.6 g/dL (ref 6.5–8.1)

## 2022-05-09 LAB — OCCULT BLOOD X 1 CARD TO LAB, STOOL: Fecal Occult Bld: POSITIVE — AB

## 2022-05-09 LAB — CBC
HCT: 34.6 % — ABNORMAL LOW (ref 36.0–46.0)
Hemoglobin: 10.9 g/dL — ABNORMAL LOW (ref 12.0–15.0)
MCH: 26.9 pg (ref 26.0–34.0)
MCHC: 31.5 g/dL (ref 30.0–36.0)
MCV: 85.4 fL (ref 80.0–100.0)
Platelets: 353 10*3/uL (ref 150–400)
RBC: 4.05 MIL/uL (ref 3.87–5.11)
RDW: 13.2 % (ref 11.5–15.5)
WBC: 9.2 10*3/uL (ref 4.0–10.5)
nRBC: 0 % (ref 0.0–0.2)

## 2022-05-09 LAB — PREGNANCY, URINE: Preg Test, Ur: NEGATIVE

## 2022-05-09 NOTE — ED Notes (Signed)
Occult card at bedside 

## 2022-05-09 NOTE — ED Notes (Signed)
Pt verbalized understanding of d/c instructions, meds, and followup care. Denies questions. VSS, no distress noted. Steady gait to exit with all belongings.  ?

## 2022-05-09 NOTE — Discharge Instructions (Addendum)
Please follow-up outpatient with gastroenterology as your symptoms appear to be subacute to chronic GI bleeding.  You could benefit from a repeat colonoscopy.  [place] [facility] was your previous GI provider.  I have spoken with Dr. Deno Etienne regarding your presentation and she agrees with the plan for outpatient follow-up for colonoscopy.

## 2022-05-09 NOTE — ED Provider Notes (Signed)
[facility] Provider Note   CSN: [id number] Arrival date & time: 05/09/22  1759     History {Add pertinent medical, surgical, social history, OB history to HPI:1} Chief Complaint  Patient presents with   Rectal Bleeding    Victoria Pierce is a 42 y.o. female.   Rectal Bleeding      Home Medications Prior to Admission medications   Medication Sig Start Date End Date Taking? Authorizing Provider  ADDERALL XR 30 MG 24 hr capsule Take 30 mg by mouth every morning. Patient not taking: Reported on 12/27/2021 07/20/21   [provider]  ARIPiprazole (ABILIFY) 10 MG tablet Take 1 tablet (10 mg total) by mouth daily. 03/14/22   Bahraini, Sarah A  benztropine (COGENTIN) 0.5 MG tablet Take 1 tablet (0.5 mg total) by mouth at bedtime. 12/27/21   Bahraini, Sarah A  cyclobenzaprine (FLEXERIL) 10 MG tablet Take 10 mg by mouth 3 (three) times daily as needed for muscle spasms.     [provider]  DULoxetine (CYMBALTA) 30 MG capsule Take 30 mg by mouth every morning. 09/10/21   [provider]  ferrous sulfate 325 (65 FE) MG tablet Take 1 tablet (325 mg total) by mouth daily. Patient not taking: Reported on 12/27/2021 11/03/20   Margette Fast, MD  gabapentin (NEURONTIN) 800 MG tablet Take 800 mg by mouth 3 (three) times daily. 09/10/21   [provider]  gabapentin (NEURONTIN) 800 MG tablet Take 1 tablet (800 mg total) by mouth 3 (three) times daily for pain 03/06/22     guanFACINE (INTUNIV) 1 MG TB24 ER tablet Take 1 tablet (1 mg total) by mouth at bedtime. 04/25/22   Nwoko, Terese Door, PA  hydrocortisone (ANUSOL-HC) 2.5 % rectal cream Place 1 application rectally 2 (two) times daily. 11/13/20   Rai, Vernelle Emerald, MD  hydrocortisone (ANUSOL-HC) 25 MG suppository Place 1 suppository (25 mg total) rectally 2 (two) times daily as needed for hemorrhoids or anal itching. 11/13/20   Rai, Ripudeep K, MD  hydrocortisone 2.5 % cream  hydrocortisone 2.5 % topical cream with perineal applicator  PLACE 1 APPLICATION RECTALLY 2 (TWO) TIMES DAILY.    [provider]  lamoTRIgine (LAMICTAL) 25 MG tablet Weeks 1 and 2: Take 25 mg daily. / Weeks 3 and 4: Take 50 mg daily. / Week 5: Take 100 mg daily and continue at this dose. 12/27/21   Bahraini, Sarah A  naproxen (NAPROSYN) 500 MG tablet Take 500 mg by mouth daily as needed. 07/31/21   [provider]  ondansetron (ZOFRAN-ODT) 4 MG disintegrating tablet Take 1 tablet (4 mg total) by mouth every 6 (six) hours as needed for nausea or vomiting. Patient not taking: Reported on 12/27/2021 09/27/18   Tylene Fantasia, PA-C  polyethylene glycol (MIRALAX / GLYCOLAX) 17 g packet Take 17 g by mouth daily. Available OTC 11/13/20   Rai, Vernelle Emerald, MD  propranolol (INDERAL) 20 MG tablet Take 1 tablet (20 mg total) by mouth 2 (two) times daily as needed (anxiety). 12/27/21   Bahraini, Sarah A  QUEtiapine (SEROQUEL) 100 MG tablet Take 100 mg by mouth at bedtime. 09/08/21   [provider]  rizatriptan (MAXALT) 10 MG tablet 1 tablet as needed for migraine. 12/01/20   [provider]  SYMBICORT 160-4.5 MCG/ACT inhaler Inhale into the lungs. Patient not taking: Reported on 12/27/2021 08/02/21   [provider]  topiramate (TOPAMAX) 50 MG tablet Take 50 mg by mouth 2 (  two) times daily. Patient not taking: Reported on 12/27/2021    [provider]  VYVANSE 70 MG capsule Take 70 mg by mouth every morning. Patient not taking: Reported on 12/27/2021 11/01/20   [provider]  Wheat Dextrin (BENEFIBER DRINK MIX) PACK Take 1tbs daily. Also available OTC 11/13/20   Rai, Ripudeep K, MD  albuterol (VENTOLIN HFA) 108 (90 Base) MCG/ACT inhaler Inhale 2 puffs into the lungs every 6 (six) hours as needed for wheezing or shortness of breath. Patient not taking: No sig reported 08/19/20 10/27/20  Chesley Mires, MD      Allergies    Patient has no known allergies.     Review of Systems   Review of Systems  Gastrointestinal:  Positive for hematochezia.    Physical Exam Updated Vital Signs BP (!) 120/90   Pulse 85   Temp 97.9 F (36.6 C)   Resp 18   Ht 5\' 3"  (1.6 m)   Wt 72.6 kg   SpO2 100%   BMI 28.34 kg/m  Physical Exam  ED Results / Procedures / Treatments   Labs (all labs ordered are listed, but only abnormal results are displayed) Labs Reviewed  COMPREHENSIVE METABOLIC PANEL - Abnormal; Notable for the following components:      Result Value   AST 6 (*)    Total Bilirubin 0.2 (*)    All other components within normal limits  CBC - Abnormal; Notable for the following components:   Hemoglobin 10.9 (*)    HCT 34.6 (*)    All other components within normal limits  PREGNANCY, URINE  POC OCCULT BLOOD, ED    EKG None  Radiology No results found.  Procedures Procedures  {Document cardiac monitor, telemetry assessment procedure when appropriate:1}  Medications Ordered in ED Medications - No data to display  ED Course/ Medical Decision Making/ A&P   {   Click here for ABCD2, HEART and other calculatorsREFRESH Note before signing :1}                          Medical Decision Making Amount and/or Complexity of Data Reviewed Labs: ordered.   ***  {Document critical care time when appropriate:1} {Document review of labs and clinical decision tools ie heart score, Chads2Vasc2 etc:1}  {Document your independent review of radiology images, and any outside records:1} {Document your discussion with family members, caretakers, and with consultants:1} {Document social determinants of health affecting pt's care:1} {Document your decision making why or why not admission, treatments were needed:1} Final Clinical Impression(s) / ED Diagnoses Final diagnoses:  None    Rx / DC Orders ED Discharge Orders     None

## 2022-05-09 NOTE — ED Triage Notes (Signed)
Pt arrives POV with c/o  varying amounts blood in stool for "awhile".  Reports she is having more frequent loose bowel movements over the last couple of weeks.

## 2022-05-16 ENCOUNTER — Other Ambulatory Visit: Payer: Self-pay

## 2022-05-16 ENCOUNTER — Other Ambulatory Visit (HOSPITAL_COMMUNITY): Payer: Self-pay | Admitting: Psychiatry

## 2022-05-23 ENCOUNTER — Other Ambulatory Visit: Payer: Self-pay

## 2022-05-25 ENCOUNTER — Encounter: Payer: Self-pay | Admitting: Family Medicine

## 2022-06-06 ENCOUNTER — Other Ambulatory Visit: Payer: Self-pay

## 2022-06-06 ENCOUNTER — Other Ambulatory Visit (HOSPITAL_COMMUNITY): Payer: Self-pay | Admitting: Psychiatry

## 2022-06-07 ENCOUNTER — Other Ambulatory Visit: Payer: Self-pay

## 2022-06-09 ENCOUNTER — Other Ambulatory Visit: Payer: Self-pay

## 2022-06-13 ENCOUNTER — Other Ambulatory Visit: Payer: Self-pay

## 2022-06-14 ENCOUNTER — Other Ambulatory Visit: Payer: Self-pay

## 2022-06-15 ENCOUNTER — Other Ambulatory Visit: Payer: Self-pay

## 2022-06-19 ENCOUNTER — Other Ambulatory Visit: Payer: Self-pay

## 2022-06-20 ENCOUNTER — Ambulatory Visit: Payer: Medicaid Other | Admitting: Family Medicine

## 2022-06-20 ENCOUNTER — Other Ambulatory Visit: Payer: Self-pay

## 2022-06-21 ENCOUNTER — Other Ambulatory Visit: Payer: Self-pay

## 2022-06-23 ENCOUNTER — Other Ambulatory Visit: Payer: Self-pay

## 2022-06-28 ENCOUNTER — Other Ambulatory Visit: Payer: Self-pay

## 2022-06-29 ENCOUNTER — Other Ambulatory Visit: Payer: Self-pay

## 2022-07-04 ENCOUNTER — Other Ambulatory Visit: Payer: Self-pay

## 2022-07-14 ENCOUNTER — Other Ambulatory Visit: Payer: Self-pay

## 2022-07-14 MED ORDER — PROPRANOLOL HCL 20 MG PO TABS
20.0000 mg | ORAL_TABLET | Freq: Two times a day (BID) | ORAL | 2 refills | Status: AC | PRN
  Filled 2022-07-14 – 2023-04-15 (×6): qty 60, 30d supply, fill #0
  Filled 2023-04-24 – 2023-05-10 (×4): qty 60, 30d supply, fill #1
  Filled 2023-05-28 – 2023-07-13 (×5): qty 60, 30d supply, fill #2

## 2022-07-17 ENCOUNTER — Other Ambulatory Visit: Payer: Self-pay

## 2022-07-28 ENCOUNTER — Other Ambulatory Visit: Payer: Self-pay

## 2022-09-06 ENCOUNTER — Other Ambulatory Visit: Payer: Self-pay

## 2022-09-13 ENCOUNTER — Other Ambulatory Visit: Payer: Self-pay

## 2022-09-14 ENCOUNTER — Other Ambulatory Visit: Payer: Self-pay

## 2022-09-14 ENCOUNTER — Ambulatory Visit: Payer: Medicaid Other | Admitting: Gastroenterology

## 2022-10-11 ENCOUNTER — Other Ambulatory Visit: Payer: Self-pay

## 2022-10-18 ENCOUNTER — Other Ambulatory Visit: Payer: Self-pay

## 2022-10-31 ENCOUNTER — Other Ambulatory Visit: Payer: Self-pay

## 2022-11-06 ENCOUNTER — Other Ambulatory Visit: Payer: Self-pay

## 2022-12-11 ENCOUNTER — Other Ambulatory Visit: Payer: Self-pay

## 2022-12-20 ENCOUNTER — Other Ambulatory Visit: Payer: Self-pay

## 2023-02-19 ENCOUNTER — Other Ambulatory Visit: Payer: Self-pay

## 2023-03-09 ENCOUNTER — Other Ambulatory Visit: Payer: Self-pay

## 2023-03-09 MED ORDER — GABAPENTIN 800 MG PO TABS
800.0000 mg | ORAL_TABLET | Freq: Three times a day (TID) | ORAL | 5 refills | Status: AC
  Filled 2023-03-09: qty 90, 30d supply, fill #0
  Filled 2023-03-21 – 2023-04-15 (×2): qty 90, 30d supply, fill #1
  Filled 2023-04-24 – 2023-05-10 (×4): qty 90, 30d supply, fill #2
  Filled 2023-05-28 – 2023-07-13 (×8): qty 90, 30d supply, fill #3
  Filled 2023-08-02 – 2023-09-22 (×5): qty 90, 30d supply, fill #4
  Filled 2023-10-09 – 2023-10-24 (×2): qty 90, 30d supply, fill #5

## 2023-03-12 ENCOUNTER — Other Ambulatory Visit: Payer: Self-pay

## 2023-03-16 ENCOUNTER — Other Ambulatory Visit: Payer: Self-pay

## 2023-03-16 ENCOUNTER — Other Ambulatory Visit (HOSPITAL_COMMUNITY): Payer: Self-pay | Admitting: Psychiatry

## 2023-03-19 ENCOUNTER — Other Ambulatory Visit: Payer: Self-pay

## 2023-03-22 ENCOUNTER — Other Ambulatory Visit: Payer: Self-pay

## 2023-03-27 ENCOUNTER — Other Ambulatory Visit: Payer: Self-pay

## 2023-03-28 ENCOUNTER — Other Ambulatory Visit: Payer: Self-pay

## 2023-04-16 ENCOUNTER — Other Ambulatory Visit: Payer: Self-pay

## 2023-04-17 ENCOUNTER — Other Ambulatory Visit: Payer: Self-pay

## 2023-04-24 ENCOUNTER — Other Ambulatory Visit: Payer: Self-pay

## 2023-05-01 ENCOUNTER — Other Ambulatory Visit: Payer: Self-pay

## 2023-05-10 ENCOUNTER — Other Ambulatory Visit (HOSPITAL_COMMUNITY): Payer: Self-pay | Admitting: Physician Assistant

## 2023-05-10 ENCOUNTER — Other Ambulatory Visit (HOSPITAL_COMMUNITY): Payer: Self-pay | Admitting: Psychiatry

## 2023-05-14 ENCOUNTER — Other Ambulatory Visit: Payer: Self-pay

## 2023-05-17 ENCOUNTER — Other Ambulatory Visit: Payer: Self-pay

## 2023-05-28 ENCOUNTER — Other Ambulatory Visit (HOSPITAL_COMMUNITY): Payer: Self-pay | Admitting: Physician Assistant

## 2023-05-28 ENCOUNTER — Encounter: Payer: Self-pay | Admitting: Family Medicine

## 2023-05-28 ENCOUNTER — Other Ambulatory Visit: Payer: Self-pay

## 2023-06-06 ENCOUNTER — Other Ambulatory Visit: Payer: Self-pay

## 2023-06-06 ENCOUNTER — Encounter: Payer: Self-pay | Admitting: Pharmacist

## 2023-06-11 ENCOUNTER — Other Ambulatory Visit: Payer: Self-pay

## 2023-06-12 ENCOUNTER — Other Ambulatory Visit (HOSPITAL_COMMUNITY): Payer: Self-pay | Admitting: Physician Assistant

## 2023-06-12 ENCOUNTER — Other Ambulatory Visit: Payer: Self-pay

## 2023-06-13 ENCOUNTER — Other Ambulatory Visit: Payer: Self-pay

## 2023-06-27 ENCOUNTER — Other Ambulatory Visit: Payer: Self-pay

## 2023-06-27 DIAGNOSIS — Z1231 Encounter for screening mammogram for malignant neoplasm of breast: Secondary | ICD-10-CM

## 2023-07-03 ENCOUNTER — Other Ambulatory Visit: Payer: Self-pay

## 2023-07-05 ENCOUNTER — Other Ambulatory Visit: Payer: Self-pay

## 2023-07-12 ENCOUNTER — Other Ambulatory Visit: Payer: Self-pay

## 2023-07-13 ENCOUNTER — Other Ambulatory Visit: Payer: Self-pay

## 2023-07-16 ENCOUNTER — Telehealth: Payer: Self-pay | Admitting: *Deleted

## 2023-07-16 ENCOUNTER — Ambulatory Visit: Payer: Self-pay

## 2023-08-03 ENCOUNTER — Other Ambulatory Visit: Payer: Self-pay

## 2023-08-09 ENCOUNTER — Other Ambulatory Visit: Payer: Self-pay

## 2023-08-09 MED ORDER — AMPHETAMINE-DEXTROAMPHET ER 30 MG PO CP24
30.0000 mg | ORAL_CAPSULE | Freq: Every morning | ORAL | 0 refills | Status: DC
  Filled 2023-08-09 – 2023-08-14 (×2): qty 30, 30d supply, fill #0

## 2023-08-10 ENCOUNTER — Other Ambulatory Visit: Payer: Self-pay

## 2023-08-10 ENCOUNTER — Other Ambulatory Visit (HOSPITAL_COMMUNITY): Payer: Self-pay

## 2023-08-10 MED ORDER — ZIPRASIDONE HCL 20 MG PO CAPS
20.0000 mg | ORAL_CAPSULE | Freq: Every day | ORAL | 0 refills | Status: AC
  Filled 2023-08-10 – 2023-11-15 (×5): qty 90, 90d supply, fill #0

## 2023-08-10 MED ORDER — CYCLOBENZAPRINE HCL 10 MG PO TABS
5.0000 mg | ORAL_TABLET | Freq: Three times a day (TID) | ORAL | 5 refills | Status: AC | PRN
  Filled 2023-08-10 – 2023-11-15 (×7): qty 90, 30d supply, fill #0

## 2023-08-10 MED ORDER — LIDOCAINE 5 % EX OINT
TOPICAL_OINTMENT | Freq: Two times a day (BID) | CUTANEOUS | 0 refills | Status: AC | PRN
  Filled 2023-08-10 – 2023-08-28 (×3): qty 50, 30d supply, fill #0

## 2023-08-10 MED ORDER — PROPRANOLOL HCL 20 MG PO TABS
20.0000 mg | ORAL_TABLET | Freq: Three times a day (TID) | ORAL | 0 refills | Status: DC
  Filled 2023-08-10 (×2): qty 270, 90d supply, fill #0

## 2023-08-10 MED ORDER — HYDROXYZINE HCL 25 MG PO TABS
25.0000 mg | ORAL_TABLET | Freq: Every day | ORAL | 1 refills | Status: AC | PRN
  Filled 2023-08-10 – 2023-10-09 (×5): qty 60, 30d supply, fill #0
  Filled 2023-10-24: qty 60, 30d supply, fill #1

## 2023-08-10 MED ORDER — GABAPENTIN 800 MG PO TABS
800.0000 mg | ORAL_TABLET | Freq: Three times a day (TID) | ORAL | 5 refills | Status: AC
  Filled 2023-08-10 (×2): qty 90, 30d supply, fill #0
  Filled 2023-08-28 – 2023-11-23 (×9): qty 90, 30d supply, fill #1
  Filled 2024-02-13 – 2024-02-14 (×2): qty 90, 30d supply, fill #2

## 2023-08-10 MED ORDER — TOPIRAMATE 50 MG PO TABS
50.0000 mg | ORAL_TABLET | Freq: Two times a day (BID) | ORAL | 3 refills | Status: AC
  Filled 2023-08-10 – 2023-11-15 (×7): qty 180, 90d supply, fill #0

## 2023-08-13 ENCOUNTER — Other Ambulatory Visit: Payer: Self-pay

## 2023-08-14 ENCOUNTER — Other Ambulatory Visit: Payer: Self-pay

## 2023-08-21 ENCOUNTER — Other Ambulatory Visit: Payer: Self-pay

## 2023-08-28 ENCOUNTER — Other Ambulatory Visit: Payer: Self-pay

## 2023-08-29 ENCOUNTER — Other Ambulatory Visit: Payer: Self-pay

## 2023-08-29 MED ORDER — AMPHETAMINE-DEXTROAMPHET ER 30 MG PO CP24
30.0000 mg | ORAL_CAPSULE | ORAL | 0 refills | Status: AC
  Filled 2023-08-30 – 2023-09-11 (×3): qty 30, 30d supply, fill #0

## 2023-08-30 ENCOUNTER — Other Ambulatory Visit: Payer: Self-pay

## 2023-08-31 ENCOUNTER — Other Ambulatory Visit: Payer: Self-pay

## 2023-09-05 ENCOUNTER — Other Ambulatory Visit: Payer: Self-pay

## 2023-09-06 ENCOUNTER — Other Ambulatory Visit: Payer: Self-pay

## 2023-09-11 ENCOUNTER — Other Ambulatory Visit: Payer: Self-pay

## 2023-09-11 ENCOUNTER — Other Ambulatory Visit (HOSPITAL_COMMUNITY): Payer: Self-pay

## 2023-09-26 ENCOUNTER — Other Ambulatory Visit: Payer: Self-pay

## 2023-09-27 ENCOUNTER — Other Ambulatory Visit: Payer: Self-pay

## 2023-09-27 MED ORDER — AMPHETAMINE-DEXTROAMPHET ER 30 MG PO CP24
30.0000 mg | ORAL_CAPSULE | Freq: Every morning | ORAL | 0 refills | Status: DC
  Filled 2023-09-27 – 2023-10-09 (×2): qty 30, 30d supply, fill #0

## 2023-09-28 ENCOUNTER — Other Ambulatory Visit: Payer: Self-pay

## 2023-10-08 ENCOUNTER — Other Ambulatory Visit: Payer: Self-pay

## 2023-10-09 ENCOUNTER — Other Ambulatory Visit (HOSPITAL_COMMUNITY): Payer: Self-pay | Admitting: Physician Assistant

## 2023-10-09 ENCOUNTER — Other Ambulatory Visit: Payer: Self-pay

## 2023-10-11 ENCOUNTER — Other Ambulatory Visit: Payer: Self-pay

## 2023-10-11 MED ORDER — PROPRANOLOL HCL 20 MG PO TABS
20.0000 mg | ORAL_TABLET | Freq: Three times a day (TID) | ORAL | 0 refills | Status: AC
  Filled 2023-10-24 – 2023-11-15 (×2): qty 270, 90d supply, fill #0

## 2023-10-12 ENCOUNTER — Other Ambulatory Visit: Payer: Self-pay

## 2023-10-15 ENCOUNTER — Other Ambulatory Visit: Payer: Self-pay

## 2023-10-22 ENCOUNTER — Other Ambulatory Visit: Payer: Self-pay

## 2023-10-24 ENCOUNTER — Other Ambulatory Visit: Payer: Self-pay

## 2023-10-24 MED ORDER — AMPHETAMINE-DEXTROAMPHET ER 30 MG PO CP24
30.0000 mg | ORAL_CAPSULE | Freq: Every morning | ORAL | 0 refills | Status: AC
  Filled 2023-12-12: qty 30, 30d supply, fill #0

## 2023-10-25 ENCOUNTER — Other Ambulatory Visit (HOSPITAL_COMMUNITY): Payer: Self-pay

## 2023-10-25 ENCOUNTER — Other Ambulatory Visit: Payer: Self-pay

## 2023-10-29 ENCOUNTER — Other Ambulatory Visit: Payer: Self-pay

## 2023-11-02 ENCOUNTER — Encounter: Payer: Self-pay | Admitting: Advanced Practice Midwife

## 2023-11-12 ENCOUNTER — Other Ambulatory Visit: Payer: Self-pay

## 2023-11-15 ENCOUNTER — Other Ambulatory Visit: Payer: Self-pay

## 2023-11-23 ENCOUNTER — Other Ambulatory Visit: Payer: Self-pay

## 2023-11-26 ENCOUNTER — Other Ambulatory Visit: Payer: Self-pay

## 2023-11-28 ENCOUNTER — Other Ambulatory Visit: Payer: Self-pay

## 2023-12-12 ENCOUNTER — Other Ambulatory Visit: Payer: Self-pay

## 2023-12-13 ENCOUNTER — Other Ambulatory Visit: Payer: Self-pay

## 2024-01-08 ENCOUNTER — Encounter: Payer: Self-pay | Admitting: Family Medicine

## 2024-02-07 ENCOUNTER — Ambulatory Visit: Admitting: Gastroenterology

## 2024-02-13 ENCOUNTER — Other Ambulatory Visit: Payer: Self-pay

## 2024-02-14 ENCOUNTER — Other Ambulatory Visit: Payer: Self-pay

## 2024-02-14 ENCOUNTER — Encounter (HOSPITAL_COMMUNITY): Payer: Self-pay
